# Patient Record
Sex: Male | Born: 1950 | Race: White | Hispanic: No | Smoking: Current every day smoker
Health system: Southern US, Community
[De-identification: ages and names within clinical notes are randomized; demographics above are authoritative.]

## PROBLEM LIST (undated history)

## (undated) DIAGNOSIS — M1990 Unspecified osteoarthritis, unspecified site: Secondary | ICD-10-CM

## (undated) DIAGNOSIS — IMO0001 Reserved for inherently not codable concepts without codable children: Secondary | ICD-10-CM

## (undated) DIAGNOSIS — E079 Disorder of thyroid, unspecified: Secondary | ICD-10-CM

## (undated) DIAGNOSIS — I10 Essential (primary) hypertension: Secondary | ICD-10-CM

## (undated) DIAGNOSIS — B029 Zoster without complications: Secondary | ICD-10-CM

## (undated) DIAGNOSIS — F1010 Alcohol abuse, uncomplicated: Secondary | ICD-10-CM

## (undated) DIAGNOSIS — J449 Chronic obstructive pulmonary disease, unspecified: Secondary | ICD-10-CM

## (undated) HISTORY — PX: KNEE SURGERY: SHX244

## (undated) HISTORY — PX: CATARACT EXTRACTION W/ INTRAOCULAR LENS  IMPLANT, BILATERAL: SHX1307

## (undated) HISTORY — PX: TONSILLECTOMY: SUR1361

---

## 2009-09-01 ENCOUNTER — Emergency Department (HOSPITAL_COMMUNITY): Admission: EM | Admit: 2009-09-01 | Discharge: 2009-09-01 | Payer: Self-pay | Admitting: Emergency Medicine

## 2010-07-12 LAB — COMPREHENSIVE METABOLIC PANEL
ALT: 19 U/L (ref 0–53)
AST: 29 U/L (ref 0–37)
Albumin: 4.1 g/dL (ref 3.5–5.2)
Creatinine, Ser: 0.95 mg/dL (ref 0.4–1.5)
GFR calc non Af Amer: >60 mL/min (ref >60)
Sodium: 140 mEq/L (ref 135–145)
Total Bilirubin: 0.9 mg/dL (ref 0.3–1.2)

## 2010-07-12 LAB — CBC
HCT: 42.5 % (ref 39.0–52.0)
Hemoglobin: 14.7 g/dL (ref 13.0–17.0)
MCV: 95.1 fL (ref 78.0–100.0)
Platelets: 168 10*3/uL (ref 150–400)
RDW: 14.2 % (ref 11.5–15.5)
WBC: 5.9 10*3/uL (ref 4.0–10.5)

## 2010-07-12 LAB — DIFFERENTIAL
Basophils Relative: 0 % (ref 0–1)
Eosinophils Absolute: 0.1 10*3/uL (ref 0.0–0.7)
Lymphocytes Relative: 41 % (ref 12–46)
Lymphs Abs: 2.4 10*3/uL (ref 0.7–4.0)
Monocytes Relative: 9 % (ref 3–12)
Neutro Abs: 2.8 10*3/uL (ref 1.7–7.7)
Neutrophils Relative %: 48 % (ref 43–77)

## 2010-07-12 LAB — LIPASE, BLOOD: Lipase: 33 U/L (ref 11–59)

## 2014-08-30 ENCOUNTER — Emergency Department (HOSPITAL_COMMUNITY): Payer: Medicare PPO

## 2014-08-30 ENCOUNTER — Emergency Department (HOSPITAL_COMMUNITY)
Admission: EM | Admit: 2014-08-30 | Discharge: 2014-08-30 | Disposition: A | Discharge status: Home / Self Care | Payer: Medicare PPO | Attending: Emergency Medicine | Admitting: Emergency Medicine

## 2014-08-30 ENCOUNTER — Encounter (HOSPITAL_COMMUNITY): Payer: Self-pay | Admitting: *Deleted

## 2014-08-30 DIAGNOSIS — Z8639 Personal history of other endocrine, nutritional and metabolic disease: Secondary | ICD-10-CM | POA: Insufficient documentation

## 2014-08-30 DIAGNOSIS — I10 Essential (primary) hypertension: Secondary | ICD-10-CM | POA: Diagnosis not present

## 2014-08-30 DIAGNOSIS — R630 Anorexia: Secondary | ICD-10-CM | POA: Diagnosis not present

## 2014-08-30 DIAGNOSIS — M25511 Pain in right shoulder: Secondary | ICD-10-CM

## 2014-08-30 DIAGNOSIS — Y9289 Other specified places as the place of occurrence of the external cause: Secondary | ICD-10-CM | POA: Diagnosis not present

## 2014-08-30 DIAGNOSIS — R000 Tachycardia, unspecified: Secondary | ICD-10-CM | POA: Insufficient documentation

## 2014-08-30 DIAGNOSIS — J449 Chronic obstructive pulmonary disease, unspecified: Secondary | ICD-10-CM | POA: Diagnosis not present

## 2014-08-30 DIAGNOSIS — W19XXXA Unspecified fall, initial encounter: Secondary | ICD-10-CM

## 2014-08-30 DIAGNOSIS — Z79899 Other long term (current) drug therapy: Secondary | ICD-10-CM | POA: Diagnosis not present

## 2014-08-30 DIAGNOSIS — S42291A Other displaced fracture of upper end of right humerus, initial encounter for closed fracture: Secondary | ICD-10-CM | POA: Insufficient documentation

## 2014-08-30 DIAGNOSIS — S4991XA Unspecified injury of right shoulder and upper arm, initial encounter: Secondary | ICD-10-CM | POA: Diagnosis present

## 2014-08-30 DIAGNOSIS — W1839XA Other fall on same level, initial encounter: Secondary | ICD-10-CM | POA: Diagnosis not present

## 2014-08-30 DIAGNOSIS — Z8619 Personal history of other infectious and parasitic diseases: Secondary | ICD-10-CM | POA: Diagnosis not present

## 2014-08-30 DIAGNOSIS — Z720 Tobacco use: Secondary | ICD-10-CM | POA: Diagnosis not present

## 2014-08-30 DIAGNOSIS — R112 Nausea with vomiting, unspecified: Secondary | ICD-10-CM | POA: Insufficient documentation

## 2014-08-30 DIAGNOSIS — F1010 Alcohol abuse, uncomplicated: Secondary | ICD-10-CM | POA: Diagnosis not present

## 2014-08-30 DIAGNOSIS — M1990 Unspecified osteoarthritis, unspecified site: Secondary | ICD-10-CM | POA: Insufficient documentation

## 2014-08-30 DIAGNOSIS — S42201A Unspecified fracture of upper end of right humerus, initial encounter for closed fracture: Secondary | ICD-10-CM

## 2014-08-30 DIAGNOSIS — S0990XA Unspecified injury of head, initial encounter: Secondary | ICD-10-CM | POA: Insufficient documentation

## 2014-08-30 DIAGNOSIS — Y998 Other external cause status: Secondary | ICD-10-CM | POA: Diagnosis not present

## 2014-08-30 DIAGNOSIS — Y9389 Activity, other specified: Secondary | ICD-10-CM | POA: Insufficient documentation

## 2014-08-30 HISTORY — DX: Zoster without complications: B02.9

## 2014-08-30 HISTORY — DX: Alcohol abuse, uncomplicated: F10.10

## 2014-08-30 HISTORY — DX: Essential (primary) hypertension: I10

## 2014-08-30 HISTORY — DX: Chronic obstructive pulmonary disease, unspecified: J44.9

## 2014-08-30 HISTORY — DX: Disorder of thyroid, unspecified: E07.9

## 2014-08-30 HISTORY — DX: Unspecified osteoarthritis, unspecified site: M19.90

## 2014-08-30 LAB — COMPREHENSIVE METABOLIC PANEL
ALK PHOS: 51 U/L (ref 38–126)
ALT: 24 U/L (ref 17–63)
AST: 56 U/L — ABNORMAL HIGH (ref 15–41)
Albumin: 3.6 g/dL (ref 3.5–5.0)
Anion gap: 17 — ABNORMAL HIGH (ref 5–15)
BILIRUBIN TOTAL: 0.8 mg/dL (ref 0.3–1.2)
BUN: 18 mg/dL (ref 6–20)
CO2: 19 mmol/L — AB (ref 22–32)
Calcium: 8.4 mg/dL — ABNORMAL LOW (ref 8.9–10.3)
Chloride: 101 mmol/L (ref 101–111)
Creatinine, Ser: 1.03 mg/dL (ref 0.61–1.24)
GLUCOSE: 98 mg/dL (ref 70–99)
POTASSIUM: 5.4 mmol/L — AB (ref 3.5–5.1)
SODIUM: 137 mmol/L (ref 135–145)
Total Protein: 6.2 g/dL — ABNORMAL LOW (ref 6.5–8.1)

## 2014-08-30 LAB — CBC
HCT: 33.6 % — ABNORMAL LOW (ref 39.0–52.0)
HEMOGLOBIN: 11.6 g/dL — AB (ref 13.0–17.0)
MCH: 33.4 pg (ref 26.0–34.0)
MCHC: 34.5 g/dL (ref 30.0–36.0)
MCV: 96.8 fL (ref 78.0–100.0)
Platelets: 132 10*3/uL — ABNORMAL LOW (ref 150–400)
RBC: 3.47 MIL/uL — ABNORMAL LOW (ref 4.22–5.81)
RDW: 13.7 % (ref 11.5–15.5)
WBC: 9.3 10*3/uL (ref 4.0–10.5)

## 2014-08-30 LAB — LIPASE, BLOOD: LIPASE: 12 U/L — AB (ref 22–51)

## 2014-08-30 MED ORDER — ONDANSETRON HCL 4 MG/2ML IJ SOLN
INTRAMUSCULAR | Status: AC
  Filled 2014-08-30: qty 2

## 2014-08-30 MED ORDER — HYDROCODONE-ACETAMINOPHEN 5-325 MG PO TABS: 1.0000 | ORAL_TABLET | ORAL | Status: DC | PRN

## 2014-08-30 MED ORDER — ONDANSETRON 4 MG PO TBDP: ORAL_TABLET | ORAL | Status: DC

## 2014-08-30 MED ORDER — MORPHINE SULFATE 4 MG/ML IJ SOLN
6.0000 mg | Freq: Once | INTRAMUSCULAR | Status: AC
  Administered 2014-08-30: 6 mg via INTRAVENOUS
  Filled 2014-08-30: qty 2

## 2014-08-30 MED ORDER — ONDANSETRON HCL 4 MG/2ML IJ SOLN
4.0000 mg | Freq: Once | INTRAMUSCULAR | Status: AC
  Administered 2014-08-30: 4 mg via INTRAVENOUS

## 2014-08-30 MED ORDER — SODIUM CHLORIDE 0.9 % IV BOLUS (SEPSIS)
1000.0000 mL | Freq: Once | INTRAVENOUS | Status: AC
  Administered 2014-08-30: 1000 mL via INTRAVENOUS

## 2014-08-30 MED ORDER — MORPHINE SULFATE 4 MG/ML IJ SOLN
4.0000 mg | Freq: Once | INTRAMUSCULAR | Status: AC
  Administered 2014-08-30: 4 mg via INTRAVENOUS
  Filled 2014-08-30: qty 1

## 2014-08-30 NOTE — ED Notes (Signed)
Patient fell last night while under the influence of ETOH, injuring R upper arm.  Has been unable to use it since.  Admits to ETOH this AM as well.
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
## 2014-08-30 NOTE — Discharge Instructions (Signed)
For severe pain take norco or vicodin however realize they have the potential for addiction and it can make you sleepy and has tylenol in it.  No operating machinery while taking. If you were given medicines take as directed.  If you are on coumadin or contraceptives realize their levels and effectiveness is altered by many different medicines.  If you have any reaction (rash, tongues swelling, other) to the medicines stop taking and see a physician.  ] Have potassium rechecked later this week. Please follow up as directed and return to the ER or see a physician for new or worsening symptoms.  Thank you. Filed Vitals:   08/30/14 1247  BP: 140/84  Pulse: 116  Temp: 98.4 F (36.9 C)  TempSrc: Oral  Resp: 18  Height: 6\' 2"  (1.88 m)  Weight: 145 lb (65.772 kg)  SpO2: 100%

## 2014-08-30 NOTE — ED Notes (Signed)
Patient taking fluids w/out difficulty.

## 2014-08-30 NOTE — ED Notes (Addendum)
Patient with no complaints at this time. Respirations even and unlabored. Skin warm/dry. Discharge instructions reviewed with patient at this time. Patient given opportunity to voice concerns/ask questions. IV d/c'ed and bandaid applied to site. Patient discharged at this time and left Emergency Department with steady gait.

## 2014-09-10 ENCOUNTER — Other Ambulatory Visit: Payer: Self-pay | Admitting: Orthopedic Surgery

## 2014-09-23 ENCOUNTER — Encounter (HOSPITAL_COMMUNITY): Payer: Self-pay | Admitting: *Deleted

## 2014-09-23 MED ORDER — MUPIROCIN 2 % EX OINT
1.0000 "application " | TOPICAL_OINTMENT | CUTANEOUS | Status: AC
  Administered 2014-09-24: 1 via TOPICAL
  Filled 2014-09-23: qty 22

## 2014-09-23 MED ORDER — CEFAZOLIN SODIUM-DEXTROSE 2-3 GM-% IV SOLR
2.0000 g | INTRAVENOUS | Status: AC
  Administered 2014-09-24: 2 g via INTRAVENOUS
  Filled 2014-09-23: qty 50

## 2014-09-23 NOTE — Progress Notes (Signed)
Pt denies chest pain and being under the care of a cardiologist. Pt stated that he has COPD and has SOB with exertion at times. Pt denies having a chest x ray and EKG within the last year. Pt denies having a cardiac cath and stress test. Pt made aware to stop taking Aspirin, NSAIDs and herbal medications. Pt and pt spouse verbalized understanding of all pre-op instructions.

## 2014-09-23 NOTE — Progress Notes (Signed)
   09/23/14 1236  OBSTRUCTIVE SLEEP APNEA  Have you ever been diagnosed with sleep apnea through a sleep study? No  Do you snore loudly (loud enough to be heard through closed doors)?  0  Do you often feel tired, fatigued, or sleepy during the daytime? 1  Has anyone observed you stop breathing during your sleep? 0  Do you have, or are you being treated for high blood pressure? 1  BMI more than 35 kg/m2? 0  Age over 64 years old? 1  Gender: 1

## 2014-09-24 ENCOUNTER — Inpatient Hospital Stay (HOSPITAL_COMMUNITY): Payer: Medicare PPO

## 2014-09-24 ENCOUNTER — Inpatient Hospital Stay (HOSPITAL_COMMUNITY): Payer: Medicare PPO | Admitting: Anesthesiology

## 2014-09-24 ENCOUNTER — Observation Stay (HOSPITAL_COMMUNITY)
Admission: RE | Admit: 2014-09-24 | Discharge: 2014-09-25 | Disposition: A | Discharge status: Home / Self Care | Payer: Medicare PPO | Source: Ambulatory Visit | Attending: Orthopedic Surgery | Admitting: Orthopedic Surgery

## 2014-09-24 ENCOUNTER — Encounter (HOSPITAL_COMMUNITY)
Admission: RE | Disposition: A | Discharge status: Home / Self Care | Payer: Self-pay | Source: Ambulatory Visit | Attending: Orthopedic Surgery

## 2014-09-24 ENCOUNTER — Encounter (HOSPITAL_COMMUNITY): Payer: Self-pay | Admitting: *Deleted

## 2014-09-24 DIAGNOSIS — I10 Essential (primary) hypertension: Secondary | ICD-10-CM | POA: Insufficient documentation

## 2014-09-24 DIAGNOSIS — F17210 Nicotine dependence, cigarettes, uncomplicated: Secondary | ICD-10-CM | POA: Insufficient documentation

## 2014-09-24 DIAGNOSIS — Z419 Encounter for procedure for purposes other than remedying health state, unspecified: Secondary | ICD-10-CM

## 2014-09-24 DIAGNOSIS — S42209A Unspecified fracture of upper end of unspecified humerus, initial encounter for closed fracture: Secondary | ICD-10-CM

## 2014-09-24 DIAGNOSIS — S42201A Unspecified fracture of upper end of right humerus, initial encounter for closed fracture: Secondary | ICD-10-CM | POA: Diagnosis present

## 2014-09-24 DIAGNOSIS — J449 Chronic obstructive pulmonary disease, unspecified: Secondary | ICD-10-CM | POA: Diagnosis not present

## 2014-09-24 DIAGNOSIS — E039 Hypothyroidism, unspecified: Secondary | ICD-10-CM | POA: Diagnosis not present

## 2014-09-24 DIAGNOSIS — S42351A Displaced comminuted fracture of shaft of humerus, right arm, initial encounter for closed fracture: Secondary | ICD-10-CM | POA: Diagnosis not present

## 2014-09-24 DIAGNOSIS — Z01818 Encounter for other preprocedural examination: Secondary | ICD-10-CM

## 2014-09-24 DIAGNOSIS — W19XXXA Unspecified fall, initial encounter: Secondary | ICD-10-CM | POA: Diagnosis not present

## 2014-09-24 HISTORY — PX: ORIF HUMERUS FRACTURE: SHX2126

## 2014-09-24 HISTORY — DX: Unspecified fracture of upper end of unspecified humerus, initial encounter for closed fracture: S42.209A

## 2014-09-24 HISTORY — DX: Reserved for inherently not codable concepts without codable children: IMO0001

## 2014-09-24 LAB — COMPREHENSIVE METABOLIC PANEL
ALBUMIN: 3.8 g/dL (ref 3.5–5.0)
ALT: 11 U/L — ABNORMAL LOW (ref 17–63)
ANION GAP: 9 (ref 5–15)
AST: 19 U/L (ref 15–41)
Alkaline Phosphatase: 93 U/L (ref 38–126)
BILIRUBIN TOTAL: 0.7 mg/dL (ref 0.3–1.2)
BUN: 11 mg/dL (ref 6–20)
CALCIUM: 9.4 mg/dL (ref 8.9–10.3)
CHLORIDE: 99 mmol/L — AB (ref 101–111)
CO2: 26 mmol/L (ref 22–32)
CREATININE: 1.11 mg/dL (ref 0.61–1.24)
GLUCOSE: 96 mg/dL (ref 65–99)
POTASSIUM: 3.4 mmol/L — AB (ref 3.5–5.1)
Sodium: 134 mmol/L — ABNORMAL LOW (ref 135–145)
Total Protein: 7 g/dL (ref 6.5–8.1)

## 2014-09-24 LAB — CBC WITH DIFFERENTIAL/PLATELET
BASOS ABS: 0 10*3/uL (ref 0.0–0.1)
Basophils Relative: 1 % (ref 0–1)
EOS PCT: 3 % (ref 0–5)
Eosinophils Absolute: 0.1 10*3/uL (ref 0.0–0.7)
HCT: 37.1 % — ABNORMAL LOW (ref 39.0–52.0)
Hemoglobin: 12.6 g/dL — ABNORMAL LOW (ref 13.0–17.0)
Lymphocytes Relative: 40 % (ref 12–46)
Lymphs Abs: 1.7 10*3/uL (ref 0.7–4.0)
MCH: 32.6 pg (ref 26.0–34.0)
MCHC: 34 g/dL (ref 30.0–36.0)
MCV: 96.1 fL (ref 78.0–100.0)
MONOS PCT: 13 % — AB (ref 3–12)
Monocytes Absolute: 0.5 10*3/uL (ref 0.1–1.0)
NEUTROS ABS: 1.8 10*3/uL (ref 1.7–7.7)
NEUTROS PCT: 43 % (ref 43–77)
Platelets: 208 10*3/uL (ref 150–400)
RBC: 3.86 MIL/uL — AB (ref 4.22–5.81)
RDW: 13.1 % (ref 11.5–15.5)
WBC: 4.2 10*3/uL (ref 4.0–10.5)

## 2014-09-24 LAB — SURGICAL PCR SCREEN
MRSA, PCR: NEGATIVE
STAPHYLOCOCCUS AUREUS: NEGATIVE

## 2014-09-24 LAB — PROTIME-INR
INR: 1.05 (ref 0.00–1.49)
PROTHROMBIN TIME: 13.9 s (ref 11.6–15.2)

## 2014-09-24 LAB — APTT: aPTT: 30 seconds (ref 24–37)

## 2014-09-24 SURGERY — OPEN REDUCTION INTERNAL FIXATION (ORIF) PROXIMAL HUMERUS FRACTURE
Anesthesia: General | Site: Shoulder | Laterality: Right

## 2014-09-24 MED ORDER — DEXTROSE 5 % IV SOLN
INTRAVENOUS | Status: DC | PRN
  Administered 2014-09-24: 08:00:00 via INTRAVENOUS

## 2014-09-24 MED ORDER — MIDAZOLAM HCL 2 MG/2ML IJ SOLN
INTRAMUSCULAR | Status: AC
  Filled 2014-09-24: qty 2

## 2014-09-24 MED ORDER — CEFAZOLIN SODIUM 1-5 GM-% IV SOLN
1.0000 g | Freq: Four times a day (QID) | INTRAVENOUS | Status: AC
  Administered 2014-09-24 – 2014-09-25 (×3): 1 g via INTRAVENOUS
  Filled 2014-09-24 (×3): qty 50

## 2014-09-24 MED ORDER — POVIDONE-IODINE 7.5 % EX SOLN: Freq: Once | CUTANEOUS | Status: DC

## 2014-09-24 MED ORDER — MORPHINE SULFATE 2 MG/ML IJ SOLN: 2.0000 mg | INTRAMUSCULAR | Status: DC | PRN

## 2014-09-24 MED ORDER — POLYETHYLENE GLYCOL 3350 17 G PO PACK: 17.0000 g | PACK | Freq: Every day | ORAL | Status: DC | PRN

## 2014-09-24 MED ORDER — LIDOCAINE HCL (CARDIAC) 20 MG/ML IV SOLN
INTRAVENOUS | Status: AC
  Filled 2014-09-24: qty 5

## 2014-09-24 MED ORDER — ASPIRIN EC 325 MG PO TBEC
325.0000 mg | DELAYED_RELEASE_TABLET | Freq: Two times a day (BID) | ORAL | Status: DC
  Administered 2014-09-24 – 2014-09-25 (×3): 325 mg via ORAL
  Filled 2014-09-24 (×3): qty 1

## 2014-09-24 MED ORDER — SUCCINYLCHOLINE CHLORIDE 20 MG/ML IJ SOLN
INTRAMUSCULAR | Status: DC | PRN
  Administered 2014-09-24: 100 mg via INTRAVENOUS

## 2014-09-24 MED ORDER — BISACODYL 10 MG RE SUPP: 10.0000 mg | Freq: Every day | RECTAL | Status: DC | PRN

## 2014-09-24 MED ORDER — LISINOPRIL 10 MG PO TABS
10.0000 mg | ORAL_TABLET | Freq: Every day | ORAL | Status: DC
  Filled 2014-09-24: qty 1

## 2014-09-24 MED ORDER — PROPOFOL 10 MG/ML IV BOLUS
INTRAVENOUS | Status: AC
  Filled 2014-09-24: qty 20

## 2014-09-24 MED ORDER — SUCCINYLCHOLINE CHLORIDE 20 MG/ML IJ SOLN
INTRAMUSCULAR | Status: AC
  Filled 2014-09-24: qty 1

## 2014-09-24 MED ORDER — METOCLOPRAMIDE HCL 5 MG/ML IJ SOLN: 5.0000 mg | Freq: Three times a day (TID) | INTRAMUSCULAR | Status: DC | PRN

## 2014-09-24 MED ORDER — OXYCODONE HCL 5 MG PO TABS
5.0000 mg | ORAL_TABLET | ORAL | Status: DC | PRN
  Administered 2014-09-24 – 2014-09-25 (×6): 10 mg via ORAL
  Filled 2014-09-24 (×6): qty 2

## 2014-09-24 MED ORDER — FENTANYL CITRATE (PF) 250 MCG/5ML IJ SOLN
INTRAMUSCULAR | Status: AC
  Filled 2014-09-24: qty 5

## 2014-09-24 MED ORDER — FLEET ENEMA 7-19 GM/118ML RE ENEM: 1.0000 | ENEMA | Freq: Once | RECTAL | Status: AC | PRN

## 2014-09-24 MED ORDER — LIDOCAINE HCL (CARDIAC) 20 MG/ML IV SOLN
INTRAVENOUS | Status: DC | PRN
  Administered 2014-09-24: 80 mg via INTRAVENOUS

## 2014-09-24 MED ORDER — ACETAMINOPHEN 325 MG PO TABS
650.0000 mg | ORAL_TABLET | Freq: Four times a day (QID) | ORAL | Status: DC | PRN
  Filled 2014-09-24: qty 2

## 2014-09-24 MED ORDER — HYDROMORPHONE HCL 1 MG/ML IJ SOLN
0.2500 mg | INTRAMUSCULAR | Status: DC | PRN
  Administered 2014-09-24 (×2): 0.25 mg via INTRAVENOUS

## 2014-09-24 MED ORDER — ACETAMINOPHEN 650 MG RE SUPP: 650.0000 mg | Freq: Four times a day (QID) | RECTAL | Status: DC | PRN

## 2014-09-24 MED ORDER — MENTHOL 3 MG MT LOZG: 1.0000 | LOZENGE | OROMUCOSAL | Status: DC | PRN

## 2014-09-24 MED ORDER — ONDANSETRON HCL 4 MG/2ML IJ SOLN
INTRAMUSCULAR | Status: DC | PRN
  Administered 2014-09-24: 4 mg via INTRAVENOUS

## 2014-09-24 MED ORDER — ALUMINUM HYDROXIDE GEL 320 MG/5ML PO SUSP
15.0000 mL | ORAL | Status: DC | PRN
  Filled 2014-09-24: qty 30

## 2014-09-24 MED ORDER — HYDROMORPHONE HCL 1 MG/ML IJ SOLN
INTRAMUSCULAR | Status: AC
  Filled 2014-09-24: qty 1

## 2014-09-24 MED ORDER — DIPHENHYDRAMINE HCL 12.5 MG/5ML PO ELIX
12.5000 mg | ORAL_SOLUTION | ORAL | Status: DC | PRN
Start: 2014-09-24 — End: 2014-09-25

## 2014-09-24 MED ORDER — PROPOFOL 10 MG/ML IV BOLUS
INTRAVENOUS | Status: DC | PRN
  Administered 2014-09-24: 110 mg via INTRAVENOUS

## 2014-09-24 MED ORDER — DEXTROSE 5 % IV SOLN
10.0000 mg | INTRAVENOUS | Status: DC | PRN
  Administered 2014-09-24: 20 ug/min via INTRAVENOUS

## 2014-09-24 MED ORDER — DOCUSATE SODIUM 100 MG PO CAPS
100.0000 mg | ORAL_CAPSULE | Freq: Two times a day (BID) | ORAL | Status: DC
  Administered 2014-09-24 – 2014-09-25 (×3): 100 mg via ORAL
  Filled 2014-09-24 (×3): qty 1

## 2014-09-24 MED ORDER — LACTATED RINGERS IV SOLN
INTRAVENOUS | Status: DC | PRN
  Administered 2014-09-24 (×3): via INTRAVENOUS

## 2014-09-24 MED ORDER — SODIUM CHLORIDE 0.9 % IV SOLN
INTRAVENOUS | Status: DC
  Administered 2014-09-24 – 2014-09-25 (×2): via INTRAVENOUS

## 2014-09-24 MED ORDER — ONDANSETRON HCL 4 MG/2ML IJ SOLN: 4.0000 mg | Freq: Four times a day (QID) | INTRAMUSCULAR | Status: DC | PRN

## 2014-09-24 MED ORDER — ACETAMINOPHEN 500 MG PO TABS
1000.0000 mg | ORAL_TABLET | Freq: Four times a day (QID) | ORAL | Status: AC
  Administered 2014-09-24 – 2014-09-25 (×4): 1000 mg via ORAL
  Filled 2014-09-24 (×4): qty 2

## 2014-09-24 MED ORDER — PROMETHAZINE HCL 25 MG/ML IJ SOLN
6.2500 mg | INTRAMUSCULAR | Status: DC | PRN
Start: 2014-09-24 — End: 2014-09-24

## 2014-09-24 MED ORDER — PHENOL 1.4 % MT LIQD: 1.0000 | OROMUCOSAL | Status: DC | PRN

## 2014-09-24 MED ORDER — ALBUMIN HUMAN 5 % IV SOLN
INTRAVENOUS | Status: DC | PRN
  Administered 2014-09-24: 09:00:00 via INTRAVENOUS

## 2014-09-24 MED ORDER — MIDAZOLAM HCL 5 MG/5ML IJ SOLN
INTRAMUSCULAR | Status: DC | PRN
  Administered 2014-09-24: 1 mg via INTRAVENOUS
  Administered 2014-09-24 (×2): 0.5 mg via INTRAVENOUS

## 2014-09-24 MED ORDER — SODIUM CHLORIDE 0.9 % IR SOLN
Status: DC | PRN
  Administered 2014-09-24: 3000 mL

## 2014-09-24 MED ORDER — ROPIVACAINE HCL 5 MG/ML IJ SOLN
INTRAMUSCULAR | Status: DC | PRN
  Administered 2014-09-24: 25 mL via PERINEURAL

## 2014-09-24 MED ORDER — METOCLOPRAMIDE HCL 5 MG PO TABS: 5.0000 mg | ORAL_TABLET | Freq: Three times a day (TID) | ORAL | Status: DC | PRN

## 2014-09-24 MED ORDER — ONDANSETRON HCL 4 MG/2ML IJ SOLN
INTRAMUSCULAR | Status: AC
  Filled 2014-09-24: qty 2

## 2014-09-24 MED ORDER — ONDANSETRON HCL 4 MG PO TABS: 4.0000 mg | ORAL_TABLET | Freq: Four times a day (QID) | ORAL | Status: DC | PRN

## 2014-09-24 MED ORDER — FENTANYL CITRATE (PF) 100 MCG/2ML IJ SOLN
INTRAMUSCULAR | Status: DC | PRN
  Administered 2014-09-24 (×5): 50 ug via INTRAVENOUS

## 2014-09-24 SURGICAL SUPPLY — 86 items
BIT DRILL 3.2 (BIT) ×2
BIT DRILL 3.2XCALB NS DISP (BIT) ×2 IMPLANT
BIT DRILL CALIBRATED 2.7 (BIT) ×3 IMPLANT
BIT DRILL CALIBRATED 2.7MM (BIT) ×1
BIT DRL 3.2XCALB NS DISP (BIT) ×2
BLADE SAW SAG 73X25 THK (BLADE) ×2
BLADE SAW SGTL 73X25 THK (BLADE) ×2 IMPLANT
BLADE SURG 15 STRL LF DISP TIS (BLADE) ×2 IMPLANT
BLADE SURG 15 STRL SS (BLADE) ×2
BOWL SMART MIX CTS (DISPOSABLE) IMPLANT
CHLORAPREP W/TINT 26ML (MISCELLANEOUS) ×4 IMPLANT
CLOSURE WOUND 1/2 X4 (GAUZE/BANDAGES/DRESSINGS) ×1
COVER MAYO STAND STRL (DRAPES) ×4 IMPLANT
COVER SURGICAL LIGHT HANDLE (MISCELLANEOUS) ×4 IMPLANT
DRAPE INCISE IOBAN 66X45 STRL (DRAPES) ×8 IMPLANT
DRAPE ORTHO SPLIT 77X108 STRL (DRAPES) ×4
DRAPE SURG 17X23 STRL (DRAPES) ×4 IMPLANT
DRAPE SURG ORHT 6 SPLT 77X108 (DRAPES) ×4 IMPLANT
DRAPE U-SHAPE 47X51 STRL (DRAPES) ×4 IMPLANT
DRILL BIT 5/64 (BIT) ×4 IMPLANT
DRSG AQUACEL AG ADV 3.5X10 (GAUZE/BANDAGES/DRESSINGS) ×4 IMPLANT
ELECT BLADE 4.0 EZ CLEAN MEGAD (MISCELLANEOUS) ×4
ELECT REM PT RETURN 9FT ADLT (ELECTROSURGICAL) ×4
ELECTRODE BLDE 4.0 EZ CLN MEGD (MISCELLANEOUS) ×2 IMPLANT
ELECTRODE REM PT RTRN 9FT ADLT (ELECTROSURGICAL) ×2 IMPLANT
EVACUATOR 1/8 PVC DRAIN (DRAIN) IMPLANT
GLOVE BIO SURGEON STRL SZ7 (GLOVE) ×8 IMPLANT
GLOVE BIO SURGEON STRL SZ7.5 (GLOVE) ×4 IMPLANT
GLOVE BIOGEL PI IND STRL 7.0 (GLOVE) ×2 IMPLANT
GLOVE BIOGEL PI IND STRL 8 (GLOVE) ×2 IMPLANT
GLOVE BIOGEL PI INDICATOR 7.0 (GLOVE) ×2
GLOVE BIOGEL PI INDICATOR 8 (GLOVE) ×2
GOWN STRL REUS W/ TWL LRG LVL3 (GOWN DISPOSABLE) ×6 IMPLANT
GOWN STRL REUS W/ TWL XL LVL3 (GOWN DISPOSABLE) ×2 IMPLANT
GOWN STRL REUS W/TWL LRG LVL3 (GOWN DISPOSABLE) ×6
GOWN STRL REUS W/TWL XL LVL3 (GOWN DISPOSABLE) ×2
HANDPIECE INTERPULSE COAX TIP (DISPOSABLE) ×2
HOOD PEEL AWAY FACE SHEILD DIS (HOOD) ×8 IMPLANT
K-WIRE 2X5 SS THRDED S3 (WIRE) ×8
KIT BASIN OR (CUSTOM PROCEDURE TRAY) ×4 IMPLANT
KIT ROOM TURNOVER OR (KITS) ×4 IMPLANT
KWIRE 2X5 SS THRDED S3 (WIRE) ×4 IMPLANT
MANIFOLD NEPTUNE II (INSTRUMENTS) ×4 IMPLANT
NEEDLE HYPO 25GX1X1/2 BEV (NEEDLE) ×4 IMPLANT
NEEDLE MAYO TROCAR (NEEDLE) ×4 IMPLANT
NOZZLE PRISM 8.5MM (MISCELLANEOUS) IMPLANT
NS IRRIG 1000ML POUR BTL (IV SOLUTION) ×4 IMPLANT
PACK SHOULDER (CUSTOM PROCEDURE TRAY) ×4 IMPLANT
PAD ARMBOARD 7.5X6 YLW CONV (MISCELLANEOUS) ×8 IMPLANT
PEG LOCKING 3.2MMX44 (Peg) ×4 IMPLANT
PEG LOCKING 3.2MMX46 (Peg) ×4 IMPLANT
PEG LOCKING 3.2MMX54 (Peg) ×8 IMPLANT
PEG LOCKING 3.2MMX56 (Peg) ×4 IMPLANT
PEG LOCKING 3.2MMX65 (Peg) ×8 IMPLANT
PLATE 3HOLE HUMERUS PROX RT (Plate) ×4 IMPLANT
PUTTY DBM STAGRAFT PLUS 5CC (Putty) ×4 IMPLANT
RETRIEVER SUT HEWSON (MISCELLANEOUS) ×4 IMPLANT
SCREW CORTICAL LOW PROF 3.5X32 (Screw) ×4 IMPLANT
SCREW CORTICAL LOW PROF 3.5X34 (Screw) ×8 IMPLANT
SET HNDPC FAN SPRY TIP SCT (DISPOSABLE) ×2 IMPLANT
SLEEVE MEASURING 3.2 (BIT) ×4 IMPLANT
SLEEVE SURGEON STRL (DRAPES) ×4 IMPLANT
SLING ARM IMMOBILIZER LRG (SOFTGOODS) ×4 IMPLANT
SLING ARM IMMOBILIZER MED (SOFTGOODS) IMPLANT
SPONGE LAP 18X18 X RAY DECT (DISPOSABLE) ×4 IMPLANT
SPONGE LAP 4X18 X RAY DECT (DISPOSABLE) ×4 IMPLANT
STRIP CLOSURE SKIN 1/2X4 (GAUZE/BANDAGES/DRESSINGS) ×3 IMPLANT
SUCTION FRAZIER TIP 10 FR DISP (SUCTIONS) ×4 IMPLANT
SUPPORT WRAP ARM LG (MISCELLANEOUS) ×4 IMPLANT
SUT ETHIBOND NAB CT1 #1 30IN (SUTURE) IMPLANT
SUT FIBERWIRE #2 38 T-5 BLUE (SUTURE)
SUT MAXBRAID (SUTURE) ×12 IMPLANT
SUT MNCRL AB 4-0 PS2 18 (SUTURE) ×4 IMPLANT
SUT SILK 2 0 TIES 17X18 (SUTURE)
SUT SILK 2-0 18XBRD TIE BLK (SUTURE) IMPLANT
SUT VIC AB 0 CTB1 27 (SUTURE) ×4 IMPLANT
SUT VIC AB 2-0 CT1 27 (SUTURE) ×2
SUT VIC AB 2-0 CT1 TAPERPNT 27 (SUTURE) ×2 IMPLANT
SUTURE FIBERWR #2 38 T-5 BLUE (SUTURE) IMPLANT
SYR CONTROL 10ML LL (SYRINGE) ×4 IMPLANT
SYRINGE TOOMEY DISP (SYRINGE) ×4 IMPLANT
TAPE FIBER 2MM 7IN #2 BLUE (SUTURE) IMPLANT
TOWEL OR 17X24 6PK STRL BLUE (TOWEL DISPOSABLE) ×4 IMPLANT
TOWEL OR 17X26 10 PK STRL BLUE (TOWEL DISPOSABLE) ×4 IMPLANT
WATER STERILE IRR 1000ML POUR (IV SOLUTION) ×4 IMPLANT
YANKAUER SUCT BULB TIP NO VENT (SUCTIONS) ×4 IMPLANT

## 2014-09-24 NOTE — H&P (Signed)
Larry [name] is an 64 y.o. male.   Chief Complaint: R shoulder injury HPI: s/p fall with R displaced proximal humerus fracture.  Past Medical History  Diagnosis Date  . Shingles   . Alcohol abuse   . 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 Thyroid disease     hypothyroidism  . Arthritis   . Hypertension   . Shortness of breath dyspnea     SOB with exertion    Past Surgical History  Procedure Laterality Date  . Knee surgery    . Tonsillectomy    . Cataract extraction w/ intraocular lens  implant, bilateral      Family History  Problem Relation Age of Onset  . Thyroid disease Mother   . Diabetes Other    Social History:  reports that he has been smoking Cigarettes.  He has been smoking about 0.50 packs per day. He does not have any smokeless tobacco history on file. He reports that he drinks alcohol. He reports that he does not use illicit drugs.  Allergies: No Known Allergies  Medications Prior to Admission  Medication Sig Dispense Refill  . HYDROcodone-acetaminophen (NORCO) 5-325 MG per tablet Take 1-2 tablets by mouth every 4 (four) hours as needed. (Patient taking differently: Take 1-2 tablets by mouth every 4 (four) hours as needed (pain). ) 20 tablet 0  . lisinopril (PRINIVIL,ZESTRIL) 10 MG tablet Take 10 mg by mouth daily.    Marland Kitchen MAGNESIUM PO Take 1 tablet by mouth daily.    . Multiple Vitamins-Minerals (MULTIVITAMIN PO) Take 1 tablet by mouth daily.    Marland Kitchen thiamine 100 MG tablet Take 100 mg by mouth daily.    . ondansetron (ZOFRAN ODT) 4 MG disintegrating tablet 4mg  ODT q4 hours prn nausea/vomit (Patient not taking: Reported on 09/24/2014) 4 tablet 0    No results found for this or any previous visit (from the past 48 hour(s)). Dg Chest 2 View  09/24/2014   CLINICAL DATA:  Preop right shoulder surgery.  EXAM: CHEST  2 VIEW  COMPARISON:  09/01/2009  FINDINGS: The lungs are hyperinflated. There are innumerable calcified granuloma scattered  throughout both lungs. No consolidation, pleural effusion, or pneumothorax. Pulmonary vasculature is normal. Old right rib fractures are seen. Displaced proximal right humerus fracture, partially included. Chronic change about the left distal clavicle.  IMPRESSION: 1. Hyperinflation.  No acute pulmonary process. 2. Innumerable calcified granuloma throughout both lungs. 3. Old right rib fractures.  Right proximal humerus fracture.   Electronically Signed   By: 11/01/2009 M.D.   On: 09/24/2014 07:05    Review of Systems  All other systems reviewed and are negative.   Blood pressure 128/72, temperature 97.7 F (36.5 C), temperature source Oral, resp. rate 18, height 6' (1.829 m), weight 65.772 kg (145 lb), SpO2 100 %. Physical Exam  Constitutional: He is oriented to person, place, and time. He appears well-developed and well-nourished.  HENT:  Head: Atraumatic.  Eyes: EOM are normal.  Cardiovascular: Intact distal pulses.   Respiratory: Effort normal.  Musculoskeletal:  R shoulder mild swelling, pain with ROM, NVID  Neurological: He is alert and oriented to person, place, and time.  Skin: Skin is warm and dry.  Psychiatric: He has a normal mood and affect.     Assessment/Plan R displaced comminuted proximal humerus fracture Plan ORIF vs reverse TSA Risks / benefits of surgery discussed Consent on chart  NPO for OR Preop antibiotics   11-26-1984 Jones Broom  09/24/2014, 7:16 AM

## 2014-09-24 NOTE — Op Note (Signed)
Procedure(s): OPEN REDUCTION INTERNAL FIXATION (ORIF) PROXIMAL HUMERUS FRACTURE Procedure Note  Larry [name] male 64 y.o. 09/24/2014  Procedure(s) and Anesthesia Type:    * OPEN REDUCTION INTERNAL FIXATION (ORIF) RIGHT PROXIMAL HUMERUS FRACTURE - General  Surgeon(s) and Role:    * Jones Broom, MD - Primary   Indications:  64 y.o. male s/p fall with right severely displaced proximal humerus fracture 4 weeks ago. Indicated for surgery to try and restore function of the shoulder, prevent nonunion and allow as much function and pain relief as possible with this severe injury. He also understood that if bone quality was poor and combination was greater than expected we would consider arthroplasty.     Surgeon: Larry [name]   Assistants: Damita Lack PA-C [facility] was present and scrubbed throughout the procedure and was essential in positioning, retraction, exposure, and closure)  Anesthesia: General endotracheal anesthesia with preoperative interscalene block given by the attending anesthesiologist    Procedure Detail  OPEN REDUCTION INTERNAL FIXATION (ORIF) PROXIMAL HUMERUS FRACTURE  Findings: Biomet S3 plate with locking pegs proximally. Extensive mobilization was required to achieve acceptable reduction. The articular surface was intact.  Estimated Blood Loss:  700 mL         Drains: none  Blood Given: none         Specimens: none        Complications:  * No complications entered in OR log *         Disposition: PACU - hemodynamically stable.         Condition: stable    Procedure:   DESCRIPTION OF PROCEDURE: The patient was identified in preoperative  holding area where I personally marked the operative site after  verifying site, side, and procedure with the patient. The patient was taken back  to the operating room where general anesthesia was induced without  complication and was placed in the beach-chair position with the back  elevated  about 40 degrees and all extremities carefully padded and  positioned.  The right upper extremity was then prepped and  draped in a standard sterile fashion. The appropriate time-out  procedure was carried out. The patient did receive IV antibiotics  within 30 minutes of incision.  A approximate 10 cm incision was made from the tip of the coracoid to the center point on the humerus. Dissection was carried down through subcutaneous tissues. The cephalic vein was identified and the deltopectoral interval was then divided. The cephalic vein was taken laterally with the deltoid. The conjoined tendon was identified and retractors placed beneath the conjoined tendon medially and the deltoid laterally. The biceps tendon was identified and traced into the central groove. The rotator interval was opened sharply. Biceps tendon was tenotomized and the proximal portion was cut and discarded. Cobb elevator was used to carefully identify fracture fragments. The shaft was identified distally and then traced proximally very carefully. The shaft is noted to be severely displaced posteriorly and medially into the axilla. Careful dissection was made taking great care to avoid plunging deeply. Using a bone hook and Cobb elevator the shaft was eventually able to be reduced anteriorly. The humeral head was noted to have some impaction of the central bone with a notable void when the shaft was reduced. Therefore some autograft bone was used from exposure and 5 mL of DBX paste was used to fill the void. The shaft was then impacted into the head with some compression and a 3 hole plate was laid laterally. Position was  verified with fluoroscopy and the proximal pegs and distal nonlocking screws were drilled measured and filled with appropriate sized pegs and screws. Careful fluoroscopic imaging in all planes revealed no penetration of the articular surface. It was also able to digitally feel through the rotator interval to ensure no  penetration the joint. 3 #2 max braid sutures were previously placed in rotator cuff bone tendon junction in the supraspinatus infraspinatus and subscapularis and were carefully tied into the plate to reinforce the repair. The wound was copiously irrigated with normal saline and subsequent to closed in layers with 20 Vicryls and staples. A light sterile dressing was applied. Patient was allowed to awaken from anesthesia transferred to the stretcher and taken to the recovery room in stable condition.  Postoperative plan: He will be observed overnight for pain control and antibiotics and likely discharged tomorrow. He will remain in a sling for 6 weeks without early motion.

## 2014-09-24 NOTE — Transfer of Care (Signed)
Immediate Anesthesia Transfer of Care Note  Patient: Larry [name]  Procedure(s) Performed: Procedure(s): OPEN REDUCTION INTERNAL FIXATION (ORIF) PROXIMAL HUMERUS FRACTURE (Right)  Patient Location: PACU  Anesthesia Type:General  Level of Consciousness: awake, alert  and patient cooperative  Airway & Oxygen Therapy: Patient Spontanous Breathing and Patient connected to nasal cannula oxygen  Post-op Assessment: Report given to RN, Post -op Vital signs reviewed and stable and Patient moving all extremities  Post vital signs: Reviewed and stable  Last Vitals:  Filed Vitals:   09/24/14 0611  BP: 128/72  Temp: 36.5 C  Resp: 18    Complications: No apparent anesthesia complications

## 2014-09-24 NOTE — Anesthesia Preprocedure Evaluation (Addendum)
Anesthesia Evaluation  Patient identified by MRN, date of birth, ID band Patient awake    Reviewed: Allergy & Precautions, NPO status , Patient's Chart, lab work & pertinent test results  Airway Mallampati: II  TM Distance: >3 FB Neck ROM: Full    Dental no notable dental hx. (+) Edentulous Upper, Edentulous Lower, Dental Advisory Given   Pulmonary COPDCurrent Smoker,  breath sounds clear to auscultation  Pulmonary exam normal       Cardiovascular hypertension, Pt. on medications Normal cardiovascular examRhythm:Regular Rate:Normal     Neuro/Psych negative neurological ROS  negative psych ROS   GI/Hepatic negative GI ROS, (+)     substance abuse  alcohol use,   Endo/Other  negative endocrine ROS  Renal/GU negative Renal ROS  negative genitourinary   Musculoskeletal negative musculoskeletal ROS (+)   Abdominal   Peds negative pediatric ROS (+)  Hematology negative hematology ROS (+)   Anesthesia Other Findings Scraggly Beard  Reproductive/Obstetrics negative OB ROS                           Anesthesia Physical Anesthesia Plan  ASA: III  Anesthesia Plan: General   Post-op Pain Management:    Induction: Intravenous  Airway Management Planned: Oral ETT  Additional Equipment:   Intra-op Plan:   Post-operative Plan: Extubation in OR  Informed Consent: I have reviewed the patients History and Physical, chart, labs and discussed the procedure including the risks, benefits and alternatives for the proposed anesthesia with the patient or authorized representative who has indicated his/her understanding and acceptance.   Dental advisory given  Plan Discussed with: CRNA and Surgeon  Anesthesia Plan Comments:         Anesthesia Quick Evaluation

## 2014-09-24 NOTE — Anesthesia Postprocedure Evaluation (Signed)
  Anesthesia Post-op Note  Patient: Larry [name]  Procedure(s) Performed: Procedure(s) (LRB): OPEN REDUCTION INTERNAL FIXATION (ORIF) PROXIMAL HUMERUS FRACTURE (Right)  Patient Location: PACU  Anesthesia Type: GA combined with regional for post-op pain  Level of Consciousness: awake and alert   Airway and Oxygen Therapy: Patient Spontanous Breathing  Post-op Pain: mild  Post-op Assessment: Post-op Vital signs reviewed, Patient's Cardiovascular Status Stable, Respiratory Function Stable, Patent Airway and No signs of Nausea or vomiting  Last Vitals:  Filed Vitals:   09/24/14 1143  BP:   Pulse: 81  Temp:   Resp: 17    Post-op Vital Signs: stable   Complications: No apparent anesthesia complications

## 2014-09-24 NOTE — Anesthesia Procedure Notes (Addendum)
Anesthesia Regional Block:  Interscalene brachial plexus block  Pre-Anesthetic Checklist: ,, timeout performed, Correct Patient, Correct Site, Correct Laterality, Correct Procedure, Correct Position, site marked, Risks and benefits discussed,  Surgical consent,  Pre-op evaluation,  At surgeon's request and post-op pain management  Laterality: Right  Prep: chloraprep       Needles:  Injection technique: Single-shot  Needle Type: Echogenic Stimulator Needle     Needle Length: 9cm 9 cm Needle Gauge: 21 and 21 G    Additional Needles:  Procedures: ultrasound guided (picture in chart) Interscalene brachial plexus block Narrative:  Injection made incrementally with aspirations every 5 mL.  Performed by: Personally  Anesthesiologist: ROSE, Greggory Stallion  Additional Notes: Patient tolerated the procedure well without complications   Procedure Name: Intubation Date/Time: 09/24/2014 7:46 AM Performed by: Darcey Nora B Pre-anesthesia Checklist: Patient identified, Emergency Drugs available, Suction available and Patient being monitored Patient Re-evaluated:Patient Re-evaluated prior to inductionOxygen Delivery Method: Circle system utilized Intubation Type: IV induction Ventilation: Mask ventilation without difficulty Laryngoscope Size: Mac and 3 Grade View: Grade I Tube type: Oral Tube size: 7.5 mm Number of attempts: 1 Airway Equipment and Method: Stylet Placement Confirmation: ETT inserted through vocal cords under direct vision,  breath sounds checked- equal and bilateral and positive ETCO2 Secured at: 21 (cm at gum) cm Tube secured with: Tape Dental Injury: Teeth and Oropharynx as per pre-operative assessment

## 2014-09-25 ENCOUNTER — Encounter (HOSPITAL_COMMUNITY): Payer: Self-pay | Admitting: Orthopedic Surgery

## 2014-09-25 DIAGNOSIS — S42351A Displaced comminuted fracture of shaft of humerus, right arm, initial encounter for closed fracture: Secondary | ICD-10-CM | POA: Diagnosis not present

## 2014-09-25 LAB — BASIC METABOLIC PANEL
ANION GAP: 5 (ref 5–15)
BUN: <5 mg/dL — ABNORMAL LOW (ref 6–20)
CO2: 24 mmol/L (ref 22–32)
Calcium: 7.8 mg/dL — ABNORMAL LOW (ref 8.9–10.3)
Chloride: 104 mmol/L (ref 101–111)
Creatinine, Ser: 0.82 mg/dL (ref 0.61–1.24)
GFR calc Af Amer: >60 mL/min (ref >60)
GFR calc non Af Amer: >60 mL/min (ref >60)
GLUCOSE: 104 mg/dL — AB (ref 65–99)
POTASSIUM: 3.6 mmol/L (ref 3.5–5.1)
SODIUM: 133 mmol/L — AB (ref 135–145)

## 2014-09-25 LAB — CBC
HCT: 21.9 % — ABNORMAL LOW (ref 39.0–52.0)
HCT: 24.5 % — ABNORMAL LOW (ref 39.0–52.0)
Hemoglobin: 7.5 g/dL — ABNORMAL LOW (ref 13.0–17.0)
Hemoglobin: 8.3 g/dL — ABNORMAL LOW (ref 13.0–17.0)
MCH: 33 pg (ref 26.0–34.0)
MCH: 32.7 pg (ref 26.0–34.0)
MCHC: 34.2 g/dL (ref 30.0–36.0)
MCHC: 33.9 g/dL (ref 30.0–36.0)
MCV: 96.5 fL (ref 78.0–100.0)
MCV: 96.5 fL (ref 78.0–100.0)
PLATELETS: 144 10*3/uL — AB (ref 150–400)
Platelets: 140 10*3/uL — ABNORMAL LOW (ref 150–400)
RBC: 2.27 MIL/uL — ABNORMAL LOW (ref 4.22–5.81)
RBC: 2.54 MIL/uL — ABNORMAL LOW (ref 4.22–5.81)
RDW: 12.9 % (ref 11.5–15.5)
RDW: 12.9 % (ref 11.5–15.5)
WBC: 5.4 10*3/uL (ref 4.0–10.5)
WBC: 5.9 10*3/uL (ref 4.0–10.5)

## 2014-09-25 MED ORDER — DOCUSATE SODIUM 100 MG PO CAPS: 100.0000 mg | ORAL_CAPSULE | Freq: Three times a day (TID) | ORAL | Status: DC | PRN

## 2014-09-25 MED ORDER — OXYCODONE-ACETAMINOPHEN 5-325 MG PO TABS: 1.0000 | ORAL_TABLET | ORAL | Status: DC | PRN

## 2014-09-25 NOTE — Discharge Instructions (Signed)
Discharge Instructions after Open Shoulder Repair  A sling has been provided for you. Remain in your sling at all times. This includes sleeping in your sling.  Use ice on the shoulder intermittently over the first 48 hours after surgery.  Pain medicine has been prescribed for you.  Use your medicine liberally over the first 48 hours, and then you can begin to taper your use. You may take Extra Strength Tylenol or Tylenol only in place of the pain pills. DO NOT take ANY nonsteroidal anti-inflammatory pain medications: Advil, Motrin, Ibuprofen, Aleve, Naproxen or Naprosyn.  Leave the dressing on until your first visit with Dr. Angus Palms to shower with dressing on, it is waterproof Simply allow the water to wash over the site and then pat dry. Do not rub the incisions. Make sure your axilla (armpit) is completely dry after showering.  Take one aspirin, a day for 2 weeks after surgery, unless you have an aspirin sensitivity/ allergy or asthma.   Please call [phone] during normal business hours or [phone] after hours for any problems. Including the following:  - excessive redness of the incisions - drainage for more than 4 days - fever of more than 101.5 F  *Please note that pain medications will not be refilled after hours or on weekends.

## 2014-09-25 NOTE — Progress Notes (Signed)
   PATIENT ID: Larene Pickett   1 Day Post-Op Procedure(s) (LRB): OPEN REDUCTION INTERNAL FIXATION (ORIF) PROXIMAL HUMERUS FRACTURE (Right)  Subjective: Doing well today, no pain in right shoulder, no complaints or concerns.   Objective:  Filed Vitals:   09/25/14 0436  BP: 116/63  Pulse: 79  Temp: 98 F (36.7 C)  Resp: 16     R shoulder dressing saturated with blood and changed today Incision with no drainage, sx of infection Distally mild swelling right hand, NVI   Labs:   Recent Labs  09/24/14 0652 09/25/14 0625  HGB 12.6* 7.5*   Recent Labs  09/24/14 0652 09/25/14 0625  WBC 4.2 5.4  RBC 3.86* 2.27*  HCT 37.1* 21.9*  PLT 208 140*   Recent Labs  09/24/14 0652 09/25/14 0625  NA 134* 133*  K 3.4* 3.6  CL 99* 104  CO2 26 24  BUN 11 <5*  CREATININE 1.11 0.82  GLUCOSE 96 104*  CALCIUM 9.4 7.8*    Assessment and Plan: 1 day s/p ORIF right prox humerus fracture ABLA- expected po, asymptomatic, will continue to observe and will hold off on tranfusion Sling, hand wrist elbow rom only OT today D/c home today when ready Pain well controlled  VTE proph: ASA 325mg  BID, SCDs

## 2014-09-25 NOTE — Care Management Note (Signed)
Case Management Note  Patient Details  Name: Larry [name] MRN: [id number] Date of Birth: [birth year]  Subjective/Objective:   Patient was admitted with right shoulder fracture. Underwent a right humerus ORIF.               Action/Plan:  ocupational therapist ordered a 3in1 for patient for home, Case manager spoke with patient who kindly declined.    Expected Discharge Date:  09/25/14               Expected Discharge Plan:  Home/Self Care  In-House Referral:  NA  Discharge planning Services  CM Consult  Post Acute Care Choice:  NA Choice offered to:  Patient  DME Arranged:NA    DME Agency:  NA  HH Arranged:  NA HH Agency:     Status of Service:  Completed, signed off  Medicare Important Message Given:    Date Medicare IM Given:    Medicare IM give by:    Date Additional Medicare IM Given:    Additional Medicare Important Message give by:     If discussed at Long Length of Stay Meetings, dates discussed:    Additional Comments:  Durenda Guthrie, RN 09/25/2014, 2:06 PM

## 2014-09-25 NOTE — Progress Notes (Signed)
Occupational Therapy Evaluation Patient Details Name: Larry [name] MRN: [id number] DOB: 10/12/1950 Today's Date: 09/25/2014    History of Present Illness 64 y.o. male s/p fall with right severely displaced proximal humerus fracture 4 weeks ago. Indicated for surgery to try and restore function of the shoulder, prevent nonunion and allow as much function and pain relief as possible with this severe injury. Patient s/p OPEN REDUCTION INTERNAL FIXATION (ORIF)     Clinical Impression   Patient evaluated by OT. Patient plans to d/c home with 24/7 supervision/assistance from wife who has been assisting him for the past month since his fall and displaced fracture. No further acute OT needs at this time. Recommending OPOT once MD clears and finds appropriate. D/C shoulder handout given and reviewed with patient.Please see exercises completed and educated on below. Patient aware of NO A/PROM > R shoulder. Patient independently able to direct caregivers prn for assistance with ADLs.     Follow Up Recommendations  No OT follow up;Supervision/Assistance - 24 hour    Equipment Recommendations  3 in 1 bedside comode    Recommendations for Other Services  None at this time  Precautions / Restrictions Precautions Precautions: Shoulder Type of Shoulder Precautions: No shoulder A/PROM Shoulder Interventions: Shoulder sling/immobilizer;Off for dressing/bathing/exercises Restrictions Weight Bearing Restrictions: Yes RUE Weight Bearing: Non weight bearing      Mobility Bed Mobility Overal bed mobility: Modified Independent General bed mobility comments: minimal use of bed rails, HOB flat  Transfers Overall transfer level: Independent Equipment used: None General transfer comment: No cues needed, pt overall independent     Balance Overall balance assessment: No apparent balance deficits (not formally assessed)    ADL Overall ADL's : Needs assistance/impaired General ADL Comments: Pt needs  assistance due to restrictions and immobilization of RUE. Pt reports his wife is available to assist prn and states she has been assisting for the past month. Went through entire shoulder d/c protocol and pt able to verbalize understanding. Pt will need assistance at home due to no A/PROM allowed > R shoulder.     Pertinent Vitals/Pain Pain Assessment: 0-10 Pain Score: 3  Pain Location: RUE Pain Descriptors / Indicators: Aching ("steady ache") Pain Intervention(s): Monitored during session;Repositioned     Hand Dominance Right   Extremity/Trunk Assessment Upper Extremity Assessment Upper Extremity Assessment: RUE deficits/detail RUE Deficits / Details: Shoulder protocol: no A/PROM RUE: Unable to fully assess due to immobilization   Lower Extremity Assessment Lower Extremity Assessment: Overall WFL for tasks assessed   Cervical / Trunk Assessment Cervical / Trunk Assessment: Normal   Communication Communication Communication: No difficulties   Cognition Arousal/Alertness: Awake/alert Behavior During Therapy: WFL for tasks assessed/performed Overall Cognitive Status: Within Functional Limits for tasks assessed        Shoulder Instructions Shoulder Instructions Donning/doffing shirt without moving shoulder: Minimal assistance;Patient able to independently direct caregiver Method for sponge bathing under operated UE: Minimal assistance;Patient able to independently direct caregiver Donning/doffing sling/immobilizer: Minimal assistance;Patient able to independently direct caregiver Correct positioning of sling/immobilizer: Minimal assistance;Patient able to independently direct caregiver Pendulum exercises (written home exercise program):  (n/a) ROM for elbow, wrist and digits of operated UE: Minimal assistance;Patient able to independently direct caregiver Sling wearing schedule (on at all times/off for ADL's): Minimal assistance;Patient able to independently direct  caregiver Proper positioning of operated UE when showering: Minimal assistance;Patient able to independently direct caregiver Positioning of UE while sleeping: Minimal assistance;Patient able to independently direct caregiver    Home Living Family/patient expects to  be discharged to:: Private residence Living Arrangements: Spouse/significant other Available Help at Discharge: Family;Available 24 hours/day Type of Home: Mobile home Home Access: Stairs to enter Entrance Stairs-Number of Steps: 4 Entrance Stairs-Rails: Right;Left Home Layout: One level     Bathroom Shower/Tub: Tub/shower unit;Walk-in shower   Bathroom Toilet: Standard     Home Equipment: None    Prior Functioning/Environment Level of Independence: Independent     OT Diagnosis: Generalized weakness;Acute pain   OT Problem List:  n/a, no further acute OT needs identified at this time   OT Treatment/Interventions:   n/a, no further acute OT needs identified at this time   OT Goals(Current goals can be found in the care plan section) Acute Rehab OT Goals Patient Stated Goal: go home today  OT Frequency:   n/a, no further acute OT needs identified at this time   Barriers to D/C:  None known at this time   End of Session Equipment Utilized During Treatment: Other (comment) (sling > RUE)  Activity Tolerance: Patient tolerated treatment well Patient left: in chair;with call bell/phone within reach   Time: 0810-0900 OT Time Calculation (min): 50 min Charges:  OT General Charges $OT Visit: 1 Procedure OT Evaluation $Initial OT Evaluation Tier I: 1 Procedure OT Treatments $Self Care/Home Management : 8-22 mins $Therapeutic Exercise: 8-22 mins G-Codes: OT G-codes **NOT FOR INPATIENT CLASS** Functional Limitation: Self care;Changing and maintaining body position Changing and Maintaining Body Position Current Status [phone]): At least 1 percent but less than 20 percent impaired, limited or restricted Changing and  Maintaining Body Position Goal Status (Y1017): At least 1 percent but less than 20 percent impaired, limited or restricted Changing and Maintaining Body Position Discharge Status [phone]): At least 1 percent but less than 20 percent impaired, limited or restricted Self Care Current Status (E5277): At least 1 percent but less than 20 percent impaired, limited or restricted Self Care Goal Status (O2423): At least 1 percent but less than 20 percent impaired, limited or restricted Self Care Discharge Status [phone]): At least 1 percent but less than 20 percent impaired, limited or restricted  Kelsen Celona , MS, OTR/L, CLT Pager: [phone]   09/25/2014, 9:50 AM

## 2014-09-25 NOTE — Progress Notes (Addendum)
Called Dr Ave Filter, orthopedic surgeon office to obtain DME 3 in 1 order as  per Occupational Therapy recommendation. Patient declines equipment saying " Don't need it" Darl Pikes at Dr Ave Filter office returned call regard to order; made aware of patient refusal.  Being discharged home, self care.

## 2014-09-28 ENCOUNTER — Encounter (HOSPITAL_COMMUNITY): Payer: Self-pay | Admitting: Orthopedic Surgery

## 2014-09-30 NOTE — Discharge Summary (Signed)
Patient ID: Larry [name] MRN: [id number] DOB/AGE: June 16, 1950 64 y.o.  Admit date: 09/24/2014 Discharge date: 09/25/2014  Admission Diagnoses:  Active Problems:   Proximal humerus fracture   Discharge Diagnoses:  Same  Past Medical History  Diagnosis Date  . Shingles   . Alcohol abuse   . 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 Thyroid disease     hypothyroidism  . Arthritis   . Hypertension   . Shortness of breath dyspnea     SOB with exertion    Surgeries: Procedure(s): OPEN REDUCTION INTERNAL FIXATION (ORIF) PROXIMAL HUMERUS FRACTURE on 09/24/2014   Consultants:    Discharged Condition: Improved  Hospital Course: Larry [name] is an 64 y.o. male who was admitted 09/24/2014 for operative treatment of proximal humerus fracture after fall. Xrays determined displaced fracture with best outcome of operative treatment. After pre-op clearance the patient was taken to the operating room on 09/24/2014 and underwent  Procedure(s): OPEN REDUCTION INTERNAL FIXATION (ORIF) PROXIMAL HUMERUS FRACTURE.    Patient was given perioperative antibiotics:  Anti-infectives    Start     Dose/Rate Route Frequency Ordered Stop   09/24/14 1400  ceFAZolin (ANCEF) IVPB 1 g/50 mL premix     1 g 100 mL/hr over 30 Minutes Intravenous Every 6 hours 09/24/14 1200 09/25/14 0323   09/24/14 0700  ceFAZolin (ANCEF) IVPB 2 g/50 mL premix     2 g 100 mL/hr over 30 Minutes Intravenous To Surgery 09/23/14 1158 09/24/14 0748       Patient was given sequential compression devices, early ambulation, and ASA 325mg  BID to prevent DVT.  Patient benefited maximally from hospital stay and there were no complications.    Recent vital signs: No data found.    Recent laboratory studies: No results for input(s): WBC, HGB, HCT, PLT, NA, K, CL, CO2, BUN, CREATININE, GLUCOSE, INR, CALCIUM in the last 72 hours.  Invalid input(s): PT, 2   Discharge Medications:     Medication List    STOP  taking these medications        HYDROcodone-acetaminophen 5-325 MG per tablet  Commonly known as:  NORCO      TAKE these medications        docusate sodium 100 MG capsule  Commonly known as:  COLACE  Take 1 capsule (100 mg total) by mouth 3 (three) times daily as needed.     lisinopril 10 MG tablet  Commonly known as:  PRINIVIL,ZESTRIL  Take 10 mg by mouth daily.     MAGNESIUM PO  Take 1 tablet by mouth daily.     MULTIVITAMIN PO  Take 1 tablet by mouth daily.     ondansetron 4 MG disintegrating tablet  Commonly known as:  ZOFRAN ODT  4mg  ODT q4 hours prn nausea/vomit     oxyCODONE-acetaminophen 5-325 MG per tablet  Commonly known as:  ROXICET  Take 1-2 tablets by mouth every 4 (four) hours as needed for severe pain.     thiamine 100 MG tablet  Take 100 mg by mouth daily.        Diagnostic Studies: Dg Chest 2 View  09/24/2014   CLINICAL DATA:  Preop right shoulder surgery.  EXAM: CHEST  2 VIEW  COMPARISON:  09/01/2009  FINDINGS: The lungs are hyperinflated. There are innumerable calcified granuloma scattered throughout both lungs. No consolidation, pleural effusion, or pneumothorax. Pulmonary vasculature is normal. Old right rib fractures are seen. Displaced proximal right humerus fracture, partially included.  Chronic change about the left distal clavicle.  IMPRESSION: 1. Hyperinflation.  No acute pulmonary process. 2. Innumerable calcified granuloma throughout both lungs. 3. Old right rib fractures.  Right proximal humerus fracture.   Electronically Signed   By: Rubye Oaks M.D.   On: 09/24/2014 07:05   Dg Shoulder Right  09/24/2014   CLINICAL DATA:  64 year old male undergoing proximal right humerus ORIF. Initial encounter.  EXAM: DG C-ARM 61-120 MIN; RIGHT SHOULDER - 2+ VIEW  TECHNIQUE: Two intraoperative fluoroscopic views of the proximal right humerus  CONTRAST:  None.  FLUOROSCOPY TIME:  0 minutes 17 seconds  COMPARISON:  Right humerus series 5816  FINDINGS: Lateral  plate with distal cortical screws and numerous humeral head screws or rods are in place. Significantly improved alignment about the severely comminuted proximal right humerus fracture. Glenohumeral alignment appears preserved. Hardware appears intact.  IMPRESSION: ORIF proximal right humerus with no adverse features.   Electronically Signed   By: Odessa Fleming M.D.   On: 09/24/2014 09:58   Dg C-arm 1-60 Min  09/24/2014   CLINICAL DATA:  64 year old male undergoing proximal right humerus ORIF. Initial encounter.  EXAM: DG C-ARM 61-120 MIN; RIGHT SHOULDER - 2+ VIEW  TECHNIQUE: Two intraoperative fluoroscopic views of the proximal right humerus  CONTRAST:  None.  FLUOROSCOPY TIME:  0 minutes 17 seconds  COMPARISON:  Right humerus series 5816  FINDINGS: Lateral plate with distal cortical screws and numerous humeral head screws or rods are in place. Significantly improved alignment about the severely comminuted proximal right humerus fracture. Glenohumeral alignment appears preserved. Hardware appears intact.  IMPRESSION: ORIF proximal right humerus with no adverse features.   Electronically Signed   By: Odessa Fleming M.D.   On: 09/24/2014 09:58    Disposition: 01-Home or Self Care      Discharge Instructions    Call MD / Call 911    Complete by:  As directed   If you experience chest pain or shortness of breath, CALL 911 and be transported to the hospital emergency room.  If you develope a fever above 101 F, pus (white drainage) or increased drainage or redness at the wound, or calf pain, call your surgeon's office.     Constipation Prevention    Complete by:  As directed   Drink plenty of fluids.  Prune juice may be helpful.  You may use a stool softener, such as Colace (over the counter) 100 mg twice a day.  Use MiraLax (over the counter) for constipation as needed.     Diet - low sodium heart healthy    Complete by:  As directed      Increase activity slowly as tolerated    Complete by:  As directed             Follow-up Information    Follow up with Mable Paris, MD. Schedule an appointment as soon as possible for a visit in 2 weeks.   Specialty:  Orthopedic Surgery   Contact information:   [address] [place] [address] [phone]        Signed: Jiles Harold 09/30/2014, 8:53 AM

## 2014-10-02 NOTE — Addendum Note (Signed)
Addendum  created 10/02/14 0019 by Eilene Ghazi, MD   Modules edited: Anesthesia Events, Narrator   Narrator:  Narrator: Event Log Edited

## 2015-02-05 ENCOUNTER — Emergency Department (HOSPITAL_COMMUNITY)
Admission: EM | Admit: 2015-02-05 | Discharge: 2015-02-05 | Disposition: A | Discharge status: Home / Self Care | Payer: Medicare PPO | Attending: Emergency Medicine | Admitting: Emergency Medicine

## 2015-02-05 ENCOUNTER — Encounter (HOSPITAL_COMMUNITY): Payer: Self-pay | Admitting: *Deleted

## 2015-02-05 DIAGNOSIS — Z79899 Other long term (current) drug therapy: Secondary | ICD-10-CM | POA: Diagnosis not present

## 2015-02-05 DIAGNOSIS — F419 Anxiety disorder, unspecified: Secondary | ICD-10-CM | POA: Insufficient documentation

## 2015-02-05 DIAGNOSIS — L309 Dermatitis, unspecified: Secondary | ICD-10-CM | POA: Insufficient documentation

## 2015-02-05 DIAGNOSIS — Z8639 Personal history of other endocrine, nutritional and metabolic disease: Secondary | ICD-10-CM | POA: Diagnosis not present

## 2015-02-05 DIAGNOSIS — R21 Rash and other nonspecific skin eruption: Secondary | ICD-10-CM | POA: Diagnosis present

## 2015-02-05 DIAGNOSIS — I10 Essential (primary) hypertension: Secondary | ICD-10-CM | POA: Diagnosis not present

## 2015-02-05 DIAGNOSIS — J449 Chronic obstructive pulmonary disease, unspecified: Secondary | ICD-10-CM | POA: Insufficient documentation

## 2015-02-05 DIAGNOSIS — M1990 Unspecified osteoarthritis, unspecified site: Secondary | ICD-10-CM | POA: Diagnosis not present

## 2015-02-05 DIAGNOSIS — Z720 Tobacco use: Secondary | ICD-10-CM | POA: Diagnosis not present

## 2015-02-05 DIAGNOSIS — Z8619 Personal history of other infectious and parasitic diseases: Secondary | ICD-10-CM | POA: Diagnosis not present

## 2015-02-05 MED ORDER — SULFAMETHOXAZOLE-TRIMETHOPRIM 800-160 MG PO TABS: 1.0000 | ORAL_TABLET | Freq: Two times a day (BID) | ORAL | Status: AC

## 2015-02-05 MED ORDER — DEXAMETHASONE SODIUM PHOSPHATE 4 MG/ML IJ SOLN
8.0000 mg | Freq: Once | INTRAMUSCULAR | Status: AC
  Administered 2015-02-05: 8 mg via INTRAMUSCULAR
  Filled 2015-02-05: qty 2

## 2015-02-05 MED ORDER — DIPHENHYDRAMINE-ZINC ACETATE 2-0.1 % EX CREA
TOPICAL_CREAM | Freq: Once | CUTANEOUS | Status: AC
  Administered 2015-02-05: 12:00:00 via TOPICAL
  Filled 2015-02-05: qty 28

## 2015-02-05 MED ORDER — PREDNISONE 10 MG PO TABS: ORAL_TABLET | ORAL | Status: DC

## 2015-02-05 NOTE — ED Notes (Signed)
Pt states rash across upper chest and upper back x 2 weeks. States itching and burning. Also states knot to right leg after a tick bite.

## 2015-02-05 NOTE — ED Notes (Addendum)
Pt c/o itching and burning to upper back and upper chest x 2 weeks. Pt has red rash to upper back and upper chest. Pt reports he has "tried everything" and "nothing helps". Pt denies any change in laundry detergent, shampoo, soap, etc. Pt reports he "hasn't changed anything". Pt also c/o knot to right thigh area from tick bite couple of months ago.

## 2015-02-05 NOTE — Care Management (Signed)
Call received about medication assistance. Pt has insurance and is not eligible for [facility] voucher. RN Aundra Millet anticipates pt will be discharged home with PO prednison. Pt states even though he has insurance he has no money to has meds filled. RN informed this med is on the $4 list at [facility]. RN Aundra Millet will relay info to pt. No further CM needs noted.

## 2015-02-05 NOTE — ED Provider Notes (Signed)
CSN: [id number]     Arrival date & time 02/05/15  1012 History   First MD Initiated Contact with Patient 02/05/15 1031     Chief Complaint  Patient presents with  . Rash     (Consider location/radiation/quality/duration/timing/severity/associated sxs/prior Treatment) The history is provided by the patient.   Larry [name] is a 64 y.o. male presenting with a two-week history of a burning and itching rash which started on his left shoulder and upper back area and has since spread now involving his bilateral upper back and upper chest.  The rash started as itching, and has progressed to a burning pain since he has been scratching at times uncontrollably.  He denies any new exposures to chemicals, soaps, shampoo, laundry detergents.  He is not exposed to chemicals in hobbies or with yard work.  He does not work as he is disabled from COPD.  He also endorses a small knot at his left lower leg where he removed a tick a month ago.  He has had no pain, swelling or erythema surrounding the site, describes a small nontender nodule.  He also denies fevers or chills or other complaint.  He does endorse shortness of breath which is chronic and not worsened today.  Past Medical History  Diagnosis Date  . Shingles   . Alcohol abuse   . 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 Thyroid disease     hypothyroidism  . Arthritis   . Hypertension   . Shortness of breath dyspnea     SOB with exertion   Past Surgical History  Procedure Laterality Date  . Knee surgery    . Tonsillectomy    . Cataract extraction w/ intraocular lens  implant, bilateral    . Orif humerus fracture Right 09/24/2014    Procedure: OPEN REDUCTION INTERNAL FIXATION (ORIF) PROXIMAL HUMERUS FRACTURE;  Surgeon: Jones Broom, MD;  Location: [facility] OR;  Service: Orthopedics;  Laterality: Right;   Family History  Problem Relation Age of Onset  . Thyroid disease Mother   . Diabetes Other    Social History   Substance Use Topics  . Smoking status: Current Every Day Smoker -- 0.50 packs/day    Types: Cigarettes  . Smokeless tobacco: None  . Alcohol Use: Yes     Comment: 1/2 pint liquor daily    Review of Systems  Constitutional: Negative for fever and chills.  HENT: Negative for congestion and sore throat.   Eyes: Negative.   Respiratory: Negative for chest tightness and shortness of breath.   Cardiovascular: Negative for chest pain.  Gastrointestinal: Negative for nausea and abdominal pain.  Genitourinary: Negative.   Musculoskeletal: Negative for joint swelling, arthralgias and neck pain.  Skin: Positive for rash. Negative for wound.  Neurological: Negative for dizziness, weakness, light-headedness, numbness and headaches.  Psychiatric/Behavioral: Negative.       Allergies  Review of patient's allergies indicates no known allergies.  Home Medications   Prior to Admission medications   Medication Sig Start Date End Date Taking? Authorizing Provider  lisinopril (PRINIVIL,ZESTRIL) 10 MG tablet Take 10 mg by mouth daily.   Yes Historical Provider, MD  MAGNESIUM PO Take 1 tablet by mouth daily.   Yes Historical Provider, MD  Multiple Vitamins-Minerals (MULTIVITAMIN PO) Take 1 tablet by mouth daily.   Yes Historical Provider, MD  thiamine 100 MG tablet Take 100 mg by mouth daily.   Yes Historical Provider, MD  docusate sodium (COLACE) 100 MG  capsule Take 1 capsule (100 mg total) by mouth 3 (three) times daily as needed. Patient not taking: Reported on 02/05/2015 09/25/14   Jiles Harold, PA-C  ondansetron (ZOFRAN ODT) 4 MG disintegrating tablet 4mg  ODT q4 hours prn nausea/vomit Patient not taking: Reported on 09/24/2014 08/30/14   10/30/14, MD  oxyCODONE-acetaminophen (ROXICET) 5-325 MG per tablet Take 1-2 tablets by mouth every 4 (four) hours as needed for severe pain. Patient not taking: Reported on 02/05/2015 09/25/14   11/25/14, PA-C  predniSONE (DELTASONE) 10 MG  tablet 6, 5, 4, 3, 2 then 1 tablet by mouth daily for 6 days total. 02/05/15   02/07/15, PA-C  sulfamethoxazole-trimethoprim (BACTRIM DS,SEPTRA DS) 800-160 MG tablet Take 1 tablet by mouth 2 (two) times daily. 02/05/15 02/12/15  02/14/15, PA-C   BP 111/72 mmHg  Pulse 114  Temp(Src) 97.7 F (36.5 C) (Oral)  Resp 20  Ht 6\' 2"  (1.88 m)  Wt 140 lb (63.504 kg)  BMI 17.97 kg/m2  SpO2 100% Physical Exam  Constitutional: He appears well-developed and well-nourished.  Appears anxious.  Actively scratching his rash.  HENT:  Head: Normocephalic and atraumatic.  Eyes: Conjunctivae are normal.  Neck: Normal range of motion.  Cardiovascular: Regular rhythm, normal heart sounds and intact distal pulses.   Pulmonary/Chest: Effort normal. He has no wheezes. He has no rales.  Distant breath sounds throughout all lung fields.  No wheezing.  Abdominal: Bowel sounds are normal.  Musculoskeletal: Normal range of motion.  Neurological: He is alert.  Skin: Skin is warm. There is erythema.  Slightly raised erythematous near confluent rash in places, involving entirety of upper back from scapula through neck, also on anterior chest.  There is a distinct line on his chest where the rash and, not consistent with clothing line.  The rash on his back tapers off with few scattered lesions mid back.  There are no pustules, no vesicles.  Some of the sites appear slightly moist, there is also scattered bleeding excoriations.  Small nontender nodule left medial knee, no erythema, no fluctuance or erythema.   Psychiatric: He has a normal mood and affect.  Nursing note and vitals reviewed.   ED Course  Procedures (including critical care time) Labs Review Labs Reviewed - No data to display  Imaging Review No results found. I have personally reviewed and evaluated these images and lab results as part of my medical decision-making.   EKG Interpretation None      MDM   Final diagnoses:  Dermatitis     Excoriated, erythematous rash upper torso c/w contact dermatitis, possible superimposed cellulitis given degree of erythema, several lesions are moist, multiple areas of excoriations.  No exam findings or hx to suggest tick borne complication.  He was placed on prednisone taper (decadron injection given here), also applied benadryl/zinc oxide cream with immediate improvement in pain and itching, tube given pt.  He was placed on bactrim to cover for skin infection.  Pt seen by Dr. Burgess Amor during todays visit.    , PA-C 02/06/15 Burgess Amor  02/08/15, MD 02/08/15 [phone]

## 2015-02-05 NOTE — Discharge Instructions (Signed)
Rash A rash is a change in the color or texture of the skin. There are many different types of rashes. You may have other problems that accompany your rash. CAUSES   Infections.  Allergic reactions. This can include allergies to pets or foods.  Certain medicines.  Exposure to certain chemicals, soaps, or cosmetics.  Heat.  Exposure to poisonous plants.  Tumors, both cancerous and noncancerous. SYMPTOMS   Redness.  Scaly skin.  Itchy skin.  Dry or cracked skin.  Bumps.  Blisters.  Pain. DIAGNOSIS  Your caregiver may do a physical exam to determine what type of rash you have. A skin sample (biopsy) may be taken and examined under a microscope. TREATMENT  Treatment depends on the type of rash you have. Your caregiver may prescribe certain medicines. For serious conditions, you may need to see a skin doctor (dermatologist). HOME CARE INSTRUCTIONS   Avoid the substance that caused your rash.  Do not scratch your rash. This can cause infection.  You may take cool baths to help stop itching.  Only take over-the-counter or prescription medicines as directed by your caregiver.  Keep all follow-up appointments as directed by your caregiver. SEEK IMMEDIATE MEDICAL CARE IF:  You have increasing pain, swelling, or redness.  You have a fever.  You have new or severe symptoms.  You have body aches, diarrhea, or vomiting.  Your rash is not better after 3 days. MAKE SURE YOU:  Understand these instructions.  Will watch your condition.  Will get help right away if you are not doing well or get worse.   This information is not intended to replace advice given to you by your health care provider. Make sure you discuss any questions you have with your health care provider.    Start taking your prednisone tomorrow morning as the shot you received today will cover you for today's dose.  You may apply the Benadryl cream that you were given up to 3 times daily if needed  for itching.

## 2015-02-05 NOTE — ED Notes (Signed)
PA at bedside.

## 2015-02-05 NOTE — ED Notes (Signed)
Pt reports he needs help to get any prescriptions he is prescribed today because he doesn't have any money until Nov. 1. PA aware. Case Manager called and they cannot give any medication voucher because pt has [facility].

## 2015-05-08 DIAGNOSIS — I10 Essential (primary) hypertension: Secondary | ICD-10-CM | POA: Diagnosis not present

## 2015-05-08 DIAGNOSIS — Z681 Body mass index (BMI) 19 or less, adult: Secondary | ICD-10-CM | POA: Diagnosis not present

## 2015-05-08 DIAGNOSIS — F17200 Nicotine dependence, unspecified, uncomplicated: Secondary | ICD-10-CM | POA: Diagnosis not present

## 2015-05-11 DIAGNOSIS — J449 Chronic obstructive pulmonary disease, unspecified: Secondary | ICD-10-CM | POA: Diagnosis not present

## 2015-05-11 DIAGNOSIS — R05 Cough: Secondary | ICD-10-CM | POA: Diagnosis not present

## 2015-05-11 DIAGNOSIS — I10 Essential (primary) hypertension: Secondary | ICD-10-CM | POA: Diagnosis not present

## 2015-05-11 DIAGNOSIS — M069 Rheumatoid arthritis, unspecified: Secondary | ICD-10-CM | POA: Diagnosis not present

## 2015-06-02 DIAGNOSIS — J449 Chronic obstructive pulmonary disease, unspecified: Secondary | ICD-10-CM | POA: Diagnosis not present

## 2015-06-02 DIAGNOSIS — M069 Rheumatoid arthritis, unspecified: Secondary | ICD-10-CM | POA: Diagnosis not present

## 2015-06-02 DIAGNOSIS — R7301 Impaired fasting glucose: Secondary | ICD-10-CM | POA: Diagnosis not present

## 2015-06-02 DIAGNOSIS — I10 Essential (primary) hypertension: Secondary | ICD-10-CM | POA: Diagnosis not present

## 2015-10-05 IMAGING — RF DG SHOULDER 2+V*R*
1 series · 2 of 2 positions shown · IV contrast (agent unspecified)
Comparison: Right humerus series 6497

CLINICAL DATA: 63-year-old male undergoing proximal right humerus
ORIF. Initial encounter.

EXAM:
DG C-ARM 61-120 MIN; RIGHT SHOULDER - 2+ VIEW
TECHNIQUE: Two intraoperative fluoroscopic views of the proximal right humerus
CONTRAST:  None.
FLUOROSCOPY TIME:  0 minutes 17 seconds

[Series 1: run · 2 of 2 slices shown]
[im 1/2]
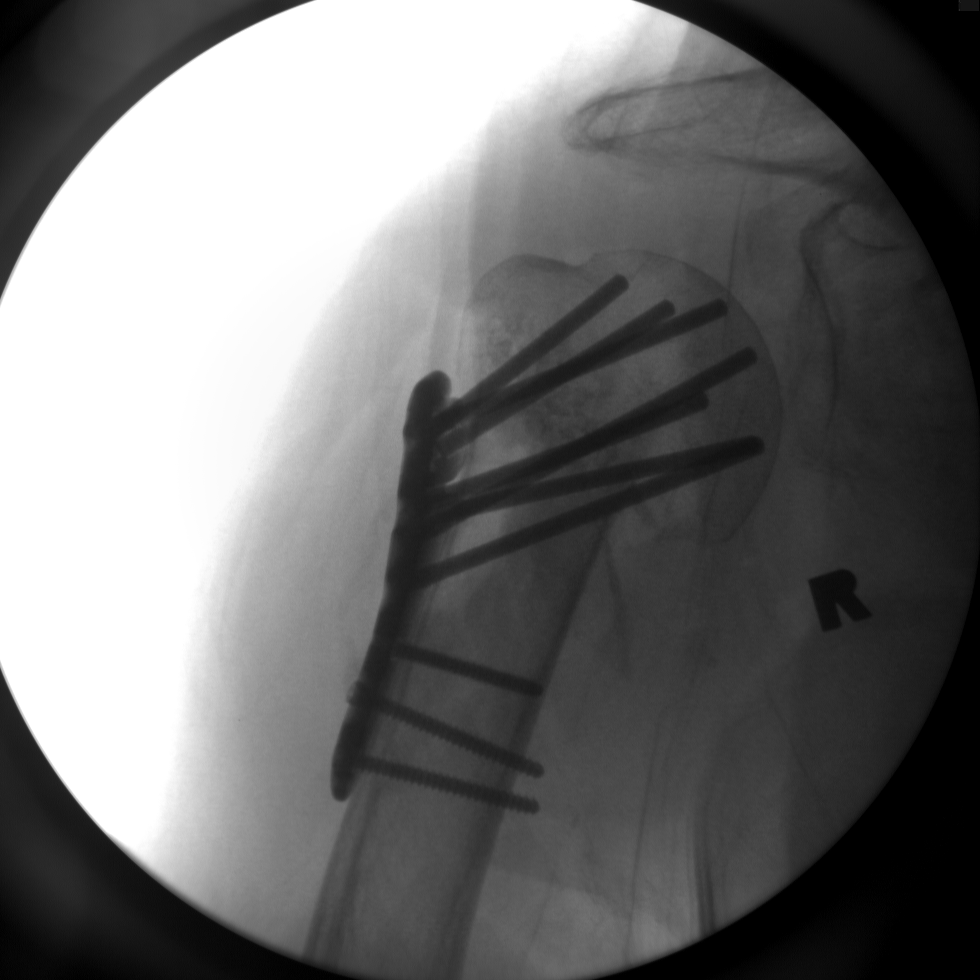
[im 2/2]
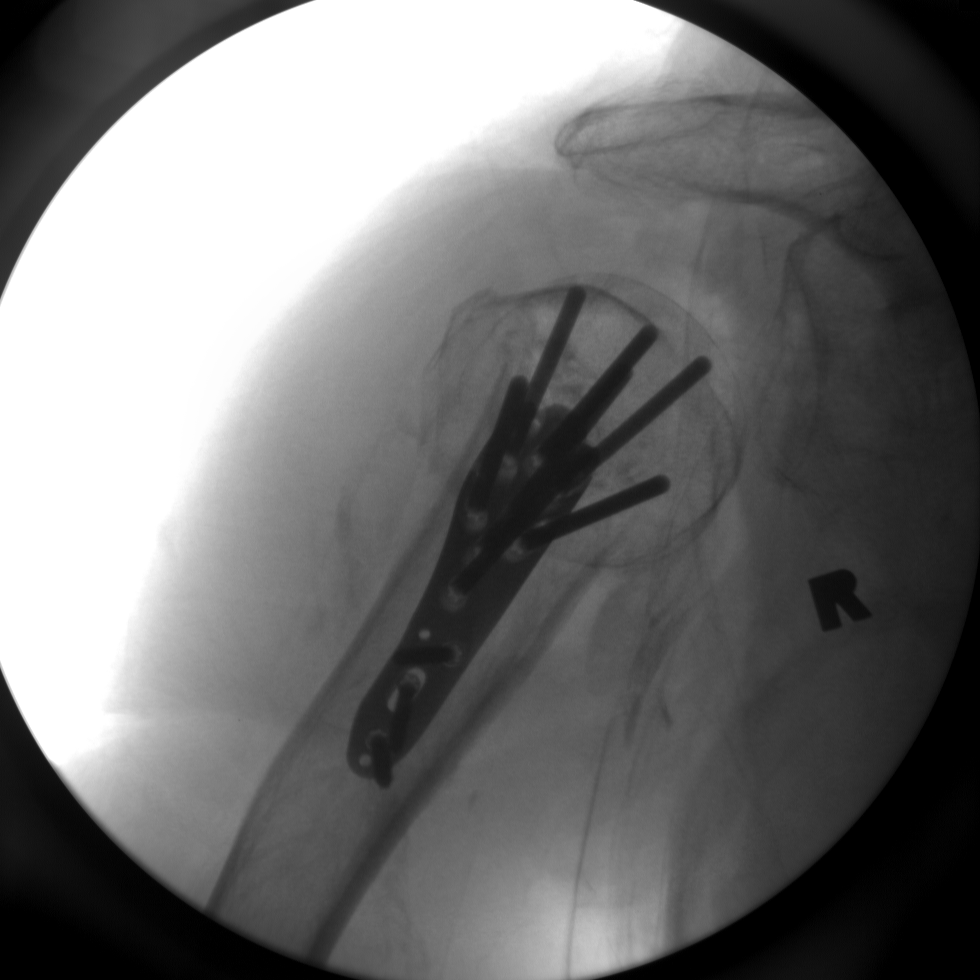

[2 of 2 positions shown; findings below may reference images not displayed]

FINDINGS: Lateral plate with distal cortical screws and numerous humeral head
screws or rods are in place. Significantly improved alignment about
the severely comminuted proximal right humerus fracture.
Glenohumeral alignment appears preserved. Hardware appears intact.
IMPRESSION: ORIF proximal right humerus with no adverse features.

## 2015-11-05 ENCOUNTER — Emergency Department (HOSPITAL_COMMUNITY)
Admission: EM | Admit: 2015-11-05 | Discharge: 2015-11-05 | Disposition: A | Discharge status: Home / Self Care | Payer: Commercial Managed Care - HMO | Attending: Emergency Medicine | Admitting: Emergency Medicine

## 2015-11-05 ENCOUNTER — Encounter (HOSPITAL_COMMUNITY): Payer: Self-pay | Admitting: Cardiology

## 2015-11-05 DIAGNOSIS — M1990 Unspecified osteoarthritis, unspecified site: Secondary | ICD-10-CM | POA: Insufficient documentation

## 2015-11-05 DIAGNOSIS — W260XXA Contact with knife, initial encounter: Secondary | ICD-10-CM | POA: Diagnosis not present

## 2015-11-05 DIAGNOSIS — F17210 Nicotine dependence, cigarettes, uncomplicated: Secondary | ICD-10-CM | POA: Diagnosis not present

## 2015-11-05 DIAGNOSIS — Y9389 Activity, other specified: Secondary | ICD-10-CM | POA: Diagnosis not present

## 2015-11-05 DIAGNOSIS — Z79899 Other long term (current) drug therapy: Secondary | ICD-10-CM | POA: Insufficient documentation

## 2015-11-05 DIAGNOSIS — Y92009 Unspecified place in unspecified non-institutional (private) residence as the place of occurrence of the external cause: Secondary | ICD-10-CM | POA: Insufficient documentation

## 2015-11-05 DIAGNOSIS — S61219A Laceration without foreign body of unspecified finger without damage to nail, initial encounter: Secondary | ICD-10-CM

## 2015-11-05 DIAGNOSIS — S61217A Laceration without foreign body of left little finger without damage to nail, initial encounter: Secondary | ICD-10-CM | POA: Insufficient documentation

## 2015-11-05 DIAGNOSIS — J449 Chronic obstructive pulmonary disease, unspecified: Secondary | ICD-10-CM | POA: Insufficient documentation

## 2015-11-05 DIAGNOSIS — E039 Hypothyroidism, unspecified: Secondary | ICD-10-CM | POA: Insufficient documentation

## 2015-11-05 DIAGNOSIS — I10 Essential (primary) hypertension: Secondary | ICD-10-CM | POA: Insufficient documentation

## 2015-11-05 DIAGNOSIS — Y999 Unspecified external cause status: Secondary | ICD-10-CM | POA: Diagnosis not present

## 2015-11-05 DIAGNOSIS — S66527A Laceration of intrinsic muscle, fascia and tendon of left little finger at wrist and hand level, initial encounter: Secondary | ICD-10-CM | POA: Diagnosis not present

## 2015-11-05 MED ORDER — TETANUS-DIPHTH-ACELL PERTUSSIS 5-2.5-18.5 LF-MCG/0.5 IM SUSP
0.5000 mL | Freq: Once | INTRAMUSCULAR | Status: AC
  Administered 2015-11-05: 0.5 mL via INTRAMUSCULAR
  Filled 2015-11-05: qty 0.5

## 2015-11-05 MED ORDER — LIDOCAINE HCL (PF) 1 % IJ SOLN
INTRAMUSCULAR | Status: AC
  Administered 2015-11-05: 2 mL
  Filled 2015-11-05: qty 5

## 2015-11-05 MED ORDER — LIDOCAINE HCL (PF) 1 % IJ SOLN
2.0000 mL | Freq: Once | INTRAMUSCULAR | Status: AC
  Administered 2015-11-05: 2 mL

## 2015-11-05 NOTE — ED Notes (Signed)
Laceration to left pinky finger with knife today.

## 2015-11-05 NOTE — ED Provider Notes (Signed)
CSN: [id number]     Arrival date & time 11/05/15  1212 History   First MD Initiated Contact with Patient 11/05/15 1238     Chief Complaint  Patient presents with  . Laceration     (Consider location/radiation/quality/duration/timing/severity/associated sxs/prior Treatment) The history is provided by the patient.   Larry [name] is a 65 y.o. male presenting with laceration to his left fifth finger.  He was working on a home project when he cut the finger using a knife.  He has applied pressure and obtain hemostasis.  He reports minimal pain and denies numbness distal to the injury site.  He is not up-to-date with his tetanus.  He has no other complaints today.    Past Medical History  Diagnosis Date  . Shingles   . Alcohol abuse   . 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 Thyroid disease     hypothyroidism  . Arthritis   . Hypertension   . Shortness of breath dyspnea     SOB with exertion   Past Surgical History  Procedure Laterality Date  . Knee surgery    . Tonsillectomy    . Cataract extraction w/ intraocular lens  implant, bilateral    . Orif humerus fracture Right 09/24/2014    Procedure: OPEN REDUCTION INTERNAL FIXATION (ORIF) PROXIMAL HUMERUS FRACTURE;  Surgeon: Jones Broom, MD;  Location: [facility] OR;  Service: Orthopedics;  Laterality: Right;   Family History  Problem Relation Age of Onset  . Thyroid disease Mother   . Diabetes Other    Social History  Substance Use Topics  . Smoking status: Current Every Day Smoker -- 0.50 packs/day    Types: Cigarettes  . Smokeless tobacco: None  . Alcohol Use: Yes     Comment: 1/2 pint liquor daily    Review of Systems  Constitutional: Negative for fever and chills.  Respiratory: Negative for shortness of breath and wheezing.   Skin: Positive for wound.  Neurological: Negative for numbness.      Allergies  Review of patient's allergies indicates no known allergies.  Home Medications    Prior to Admission medications   Medication Sig Start Date End Date Taking? Authorizing Provider  lisinopril (PRINIVIL,ZESTRIL) 10 MG tablet Take 10 mg by mouth daily.   Yes Historical Provider, MD   BP 127/73 mmHg  Pulse 95  Temp(Src) 98.4 F (36.9 C) (Oral)  Resp 16  Ht 6\' 2"  (1.88 m)  Wt 64.864 kg  BMI 18.35 kg/m2  SpO2 100% Physical Exam  Constitutional: He is oriented to person, place, and time. He appears well-developed and well-nourished.  HENT:  Head: Normocephalic.  Cardiovascular: Normal rate.   Pulmonary/Chest: Effort normal.  Neurological: He is alert and oriented to person, place, and time. No sensory deficit.  Skin: Laceration noted.  2 cm laceration left lateral distal fifth finger which is hemostatic, subcutaneous.  There is no deep structure visualized.  Distal sensation is intact with less than 2 second cap refill.    ED Course  Procedures (including critical care time)  LACERATION REPAIR Performed by: Authorized by: Burgess Amor Consent: Verbal consent obtained. Risks and benefits: risks, benefits and alternatives were discussed Consent given by: patient Patient identity confirmed: provided demographic data Prepped and Draped in normal sterile fashion Wound explored  Laceration Location: left 5th finger  Laceration Length:  2cm  No Foreign Bodies seen or palpated  Anesthesia: local infiltration  Local anesthetic: lidocaine 1% without  epinephrine  Anesthetic total: 3 ml  Irrigation method: syringe Amount of cleaning: standard  Skin closure: prolene 4-0  Number of sutures: 4  Technique: Simple interrupted   Patient tolerance: Patient tolerated the procedure well with no immediate complications.  Labs Review Labs Reviewed - No data to display  Imaging Review No results found. I have personally reviewed and evaluated these images and lab results as part of my medical decision-making.   EKG Interpretation None       MDM   Final diagnoses:  Finger laceration, initial encounter    Wound care instructions given.  Pt advised to have sutures removed in 10 days,  Return here sooner for any signs of infection including redness, swelling, worse pain or drainage of pus.  Tetanus updated.       Burgess Amor, PA-C 11/05/15 1420  Samuel Jester, DO 11/07/15 [phone]

## 2015-11-05 NOTE — Discharge Instructions (Signed)

## 2015-12-13 DIAGNOSIS — I10 Essential (primary) hypertension: Secondary | ICD-10-CM | POA: Diagnosis not present

## 2015-12-13 DIAGNOSIS — E785 Hyperlipidemia, unspecified: Secondary | ICD-10-CM | POA: Diagnosis not present

## 2015-12-15 DIAGNOSIS — I10 Essential (primary) hypertension: Secondary | ICD-10-CM | POA: Diagnosis not present

## 2015-12-15 DIAGNOSIS — J449 Chronic obstructive pulmonary disease, unspecified: Secondary | ICD-10-CM | POA: Diagnosis not present

## 2015-12-15 DIAGNOSIS — E441 Mild protein-calorie malnutrition: Secondary | ICD-10-CM | POA: Diagnosis not present

## 2015-12-15 DIAGNOSIS — D508 Other iron deficiency anemias: Secondary | ICD-10-CM | POA: Diagnosis not present

## 2015-12-15 DIAGNOSIS — M069 Rheumatoid arthritis, unspecified: Secondary | ICD-10-CM | POA: Diagnosis not present

## 2016-04-29 ENCOUNTER — Encounter (HOSPITAL_COMMUNITY): Payer: Self-pay | Admitting: *Deleted

## 2016-04-29 ENCOUNTER — Emergency Department (HOSPITAL_COMMUNITY)
Admission: EM | Admit: 2016-04-29 | Discharge: 2016-04-29 | Disposition: A | Discharge status: Home / Self Care | Payer: Medicare HMO | Attending: Emergency Medicine | Admitting: Emergency Medicine

## 2016-04-29 DIAGNOSIS — Y9389 Activity, other specified: Secondary | ICD-10-CM | POA: Insufficient documentation

## 2016-04-29 DIAGNOSIS — Y999 Unspecified external cause status: Secondary | ICD-10-CM | POA: Insufficient documentation

## 2016-04-29 DIAGNOSIS — F17210 Nicotine dependence, cigarettes, uncomplicated: Secondary | ICD-10-CM | POA: Insufficient documentation

## 2016-04-29 DIAGNOSIS — J449 Chronic obstructive pulmonary disease, unspecified: Secondary | ICD-10-CM | POA: Insufficient documentation

## 2016-04-29 DIAGNOSIS — Y92009 Unspecified place in unspecified non-institutional (private) residence as the place of occurrence of the external cause: Secondary | ICD-10-CM | POA: Diagnosis not present

## 2016-04-29 DIAGNOSIS — Z79899 Other long term (current) drug therapy: Secondary | ICD-10-CM | POA: Insufficient documentation

## 2016-04-29 DIAGNOSIS — W5501XA Bitten by cat, initial encounter: Secondary | ICD-10-CM | POA: Insufficient documentation

## 2016-04-29 DIAGNOSIS — S61532A Puncture wound without foreign body of left wrist, initial encounter: Secondary | ICD-10-CM | POA: Diagnosis not present

## 2016-04-29 DIAGNOSIS — Z23 Encounter for immunization: Secondary | ICD-10-CM | POA: Diagnosis not present

## 2016-04-29 DIAGNOSIS — S61552A Open bite of left wrist, initial encounter: Secondary | ICD-10-CM | POA: Diagnosis present

## 2016-04-29 DIAGNOSIS — E039 Hypothyroidism, unspecified: Secondary | ICD-10-CM | POA: Insufficient documentation

## 2016-04-29 DIAGNOSIS — I10 Essential (primary) hypertension: Secondary | ICD-10-CM | POA: Diagnosis not present

## 2016-04-29 MED ORDER — AMOXICILLIN-POT CLAVULANATE 875-125 MG PO TABS: 1.0000 | ORAL_TABLET | Freq: Two times a day (BID) | ORAL | 0 refills | Status: DC

## 2016-04-29 MED ORDER — AMOXICILLIN-POT CLAVULANATE 875-125 MG PO TABS
1.0000 | ORAL_TABLET | Freq: Once | ORAL | Status: AC
  Administered 2016-04-29: 1 via ORAL
  Filled 2016-04-29: qty 1

## 2016-04-29 MED ORDER — BACITRACIN ZINC 500 UNIT/GM EX OINT
TOPICAL_OINTMENT | CUTANEOUS | Status: AC
  Filled 2016-04-29: qty 0.9

## 2016-04-29 MED ORDER — TETANUS-DIPHTH-ACELL PERTUSSIS 5-2.5-18.5 LF-MCG/0.5 IM SUSP
0.5000 mL | Freq: Once | INTRAMUSCULAR | Status: AC
  Administered 2016-04-29: 0.5 mL via INTRAMUSCULAR
  Filled 2016-04-29: qty 0.5

## 2016-04-29 NOTE — ED Triage Notes (Signed)
Pt reports he was bitten by a wild cat today on the left wrist. Pt reports he "wants a tetanus shot and that's it".

## 2016-04-29 NOTE — ED Notes (Signed)
TT in to assess pt 

## 2016-04-29 NOTE — ED Provider Notes (Signed)
[facility] DEPT Provider Note   CSN: [id number] Arrival date & time: 04/29/16  1448     History   Chief Complaint Chief Complaint  Patient presents with  . Animal Bite    HPI Larry [name] is a 66 y.o. male.  HPI   Larry [name] is a 66 y.o. male who presents to the Emergency Department complaining of a recent cat bite to his left hand and wrist.  He states that a stray cat bit him when he was trying to prevent the stray from mating with his cat.  He reports a bite and a scratch.  He states that he "let the area bleed for a while then I poured alcohol on it" he denies redness, numbness or difficulty with movement of the fingers, wrist or gripping objects.  He states that all he wants is a tetanus injection.  He denies pain at this time, hx of DM or use of blood thinners. Incident has not been reported.   Past Medical History:  Diagnosis Date  . Alcohol abuse   . Arthritis   . 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 Hypertension   . Shingles   . Shortness of breath dyspnea    SOB with exertion  . Thyroid disease    hypothyroidism    Patient Active Problem List   Diagnosis Date Noted  . Proximal humerus fracture 09/24/2014    Past Surgical History:  Procedure Laterality Date  . CATARACT EXTRACTION W/ INTRAOCULAR LENS  IMPLANT, BILATERAL    . KNEE SURGERY    . ORIF HUMERUS FRACTURE Right 09/24/2014   Procedure: OPEN REDUCTION INTERNAL FIXATION (ORIF) PROXIMAL HUMERUS FRACTURE;  Surgeon: Jones Broom, MD;  Location: [facility] OR;  Service: Orthopedics;  Laterality: Right;  . TONSILLECTOMY         Home Medications    Prior to Admission medications   Medication Sig Start Date End Date Taking? Authorizing Provider  lisinopril (PRINIVIL,ZESTRIL) 10 MG tablet Take 10 mg by mouth daily.    Historical Provider, MD    Family History Family History  Problem Relation Age of Onset  . Thyroid disease Mother   . Diabetes Other      Social History Social History  Substance Use Topics  . Smoking status: Current Every Day Smoker    Packs/day: 1.00    Types: Cigarettes  . Smokeless tobacco: Never Used  . Alcohol use Yes     Comment: drinks 1/5 daily     Allergies   Patient has no known allergies.   Review of Systems Review of Systems  Constitutional: Negative for chills and fever.  Gastrointestinal: Negative for nausea and vomiting.  Musculoskeletal: Negative for arthralgias and joint swelling.  Skin: Positive for wound. Negative for color change.       Cat bite and scratches of the left wrist and hand  Neurological: Negative for dizziness, weakness and numbness.  Hematological: Does not bruise/bleed easily.  All other systems reviewed and are negative.    Physical Exam Updated Vital Signs BP 125/94 (BP Location: Right Arm)   Pulse 107   Temp 97.8 F (36.6 C) (Oral)   Resp 16   Ht 6\' 2"  (1.88 m)   Wt 61.7 kg   SpO2 98%   BMI 17.46 kg/m   Physical Exam  Constitutional: He is oriented to person, place, and time. He appears well-developed and well-nourished. No distress.  HENT:  Head: Normocephalic and atraumatic.  Mouth/Throat: Oropharynx  is clear and moist.  Neck: Normal range of motion. Neck supple.  Cardiovascular: Normal rate, regular rhythm and intact distal pulses.   No murmur heard. Pulmonary/Chest: Effort normal and breath sounds normal. No respiratory distress.  Musculoskeletal: Normal range of motion. He exhibits tenderness. He exhibits no edema.       Left hand: He exhibits tenderness and swelling. He exhibits normal range of motion and normal two-point discrimination. Normal sensation noted. Normal strength noted. He exhibits no finger abduction, no thumb/finger opposition and no wrist extension trouble.       Hands: Small puncture wounds of the distal left wrist and thenar eminence of the left thumb.  Small amt of focal edema at the wrist, no bleeding.  Pt has full ROM of the  fingers and wrist.  Sensation intact.    Lymphadenopathy:    He has no cervical adenopathy.  Neurological: He is alert and oriented to person, place, and time. He exhibits normal muscle tone. Coordination normal.  Skin: Skin is warm. No rash noted. No erythema.  Nursing note and vitals reviewed.    ED Treatments / Results  Labs (all labs ordered are listed, but only abnormal results are displayed) Labs Reviewed - No data to display  EKG  EKG Interpretation None       Radiology No results found.  Procedures Procedures (including critical care time)  WOUND CLEANING: Puncture wounds of the left hand and wrist was cleaned thoroughly by me using nml saline and pressure irrigation with a 20 cc syringe.  Area cleaned with betadine.  Wounds left open.    Patient tolerance: Patient tolerated the procedure well with no immediate complications.   Medications Ordered in ED Medications  Tdap (BOOSTRIX) injection 0.5 mL (not administered)  amoxicillin-clavulanate (AUGMENTIN) 875-125 MG per tablet 1 tablet (not administered)     Initial Impression / Assessment and Plan / ED Course  I have reviewed the triage vital signs and the nursing notes.  Pertinent labs & imaging results that were available during my care of the patient were reviewed by me and considered in my medical decision making (see chart for details).  Clinical Course     1645  [place] animal control here to take report.    Pt refusing rabies vaccinations.  He is adamantly stating the cat has been to his house many times and does not have rabies.  I have explained the possible outcomes and risks involved in developing rabies including death. I have also explained the high possibility of infection associated with cat bites. Pt verbalized understanding and continues to refuse the vaccines, but agrees to start po abx.  My recommendations and discussion of possible risks was witnessed by the nursing staff as well as  the patient's refusal.    Wounds were cleaned as listed above, td updated.  Pt agreeable to antibiotic and close PMD f/u or to return here if signs of infection develop.    Final Clinical Impressions(s) / ED Diagnoses   Final diagnoses:  Cat bite, initial encounter    New Prescriptions New Prescriptions   No medications on file     Pauline Aus, PA-C 05/03/16 1742    Bethann Berkshire, MD 05/05/16 [phone]

## 2016-04-29 NOTE — ED Notes (Signed)
Animal control notified- will send officer

## 2016-04-29 NOTE — ED Notes (Signed)
Pt reports that his cat is in heat- that another cat come into his home and when he reached for it bit him on his wrist. - He reports that he let it bleed and then washed with alcohol.  Reports. "all I'm here for is a tetanus shot".  Superficial bites to his wrist

## 2016-04-29 NOTE — ED Notes (Signed)
Pt reports that he had a rabies vaccination about 1 year ago for a rabid dog  Repeats that he is only here for a tetanus shot

## 2016-04-29 NOTE — Discharge Instructions (Signed)
Keep the wound clean with mild soap and water and keep it bandaged. Return here for any signs of infection or if you change your mind about wanting to receive the rabies vaccinations.

## 2016-06-28 DIAGNOSIS — I10 Essential (primary) hypertension: Secondary | ICD-10-CM | POA: Diagnosis not present

## 2016-06-28 DIAGNOSIS — R7301 Impaired fasting glucose: Secondary | ICD-10-CM | POA: Diagnosis not present

## 2016-06-28 DIAGNOSIS — D508 Other iron deficiency anemias: Secondary | ICD-10-CM | POA: Diagnosis not present

## 2016-06-30 DIAGNOSIS — J449 Chronic obstructive pulmonary disease, unspecified: Secondary | ICD-10-CM | POA: Diagnosis not present

## 2016-06-30 DIAGNOSIS — E441 Mild protein-calorie malnutrition: Secondary | ICD-10-CM | POA: Diagnosis not present

## 2016-06-30 DIAGNOSIS — I10 Essential (primary) hypertension: Secondary | ICD-10-CM | POA: Diagnosis not present

## 2016-06-30 DIAGNOSIS — R7301 Impaired fasting glucose: Secondary | ICD-10-CM | POA: Diagnosis not present

## 2016-08-01 DIAGNOSIS — R21 Rash and other nonspecific skin eruption: Secondary | ICD-10-CM | POA: Diagnosis not present

## 2016-12-05 DIAGNOSIS — R4781 Slurred speech: Secondary | ICD-10-CM | POA: Diagnosis not present

## 2016-12-05 DIAGNOSIS — F29 Unspecified psychosis not due to a substance or known physiological condition: Secondary | ICD-10-CM | POA: Diagnosis not present

## 2016-12-05 DIAGNOSIS — Z5321 Procedure and treatment not carried out due to patient leaving prior to being seen by health care provider: Secondary | ICD-10-CM | POA: Diagnosis not present

## 2016-12-05 DIAGNOSIS — F1010 Alcohol abuse, uncomplicated: Secondary | ICD-10-CM | POA: Diagnosis not present

## 2016-12-05 DIAGNOSIS — I10 Essential (primary) hypertension: Secondary | ICD-10-CM | POA: Diagnosis not present

## 2016-12-28 DIAGNOSIS — R7301 Impaired fasting glucose: Secondary | ICD-10-CM | POA: Diagnosis not present

## 2016-12-28 DIAGNOSIS — D508 Other iron deficiency anemias: Secondary | ICD-10-CM | POA: Diagnosis not present

## 2016-12-28 DIAGNOSIS — I10 Essential (primary) hypertension: Secondary | ICD-10-CM | POA: Diagnosis not present

## 2017-01-01 DIAGNOSIS — I10 Essential (primary) hypertension: Secondary | ICD-10-CM | POA: Diagnosis not present

## 2017-01-01 DIAGNOSIS — F1010 Alcohol abuse, uncomplicated: Secondary | ICD-10-CM | POA: Diagnosis not present

## 2017-01-01 DIAGNOSIS — J449 Chronic obstructive pulmonary disease, unspecified: Secondary | ICD-10-CM | POA: Diagnosis not present

## 2017-01-01 DIAGNOSIS — J60 Coalworker's pneumoconiosis: Secondary | ICD-10-CM | POA: Diagnosis not present

## 2017-01-01 DIAGNOSIS — Z681 Body mass index (BMI) 19 or less, adult: Secondary | ICD-10-CM | POA: Diagnosis not present

## 2017-09-28 DIAGNOSIS — I10 Essential (primary) hypertension: Secondary | ICD-10-CM | POA: Diagnosis not present

## 2017-09-28 DIAGNOSIS — Z6821 Body mass index (BMI) 21.0-21.9, adult: Secondary | ICD-10-CM | POA: Diagnosis not present

## 2018-01-03 DIAGNOSIS — I10 Essential (primary) hypertension: Secondary | ICD-10-CM | POA: Diagnosis not present

## 2018-01-03 DIAGNOSIS — Z6821 Body mass index (BMI) 21.0-21.9, adult: Secondary | ICD-10-CM | POA: Diagnosis not present

## 2018-01-03 DIAGNOSIS — R7301 Impaired fasting glucose: Secondary | ICD-10-CM | POA: Diagnosis not present

## 2018-01-03 DIAGNOSIS — D508 Other iron deficiency anemias: Secondary | ICD-10-CM | POA: Diagnosis not present

## 2018-01-03 DIAGNOSIS — E441 Mild protein-calorie malnutrition: Secondary | ICD-10-CM | POA: Diagnosis not present

## 2018-01-09 DIAGNOSIS — Z79899 Other long term (current) drug therapy: Secondary | ICD-10-CM | POA: Diagnosis not present

## 2018-01-09 DIAGNOSIS — E782 Mixed hyperlipidemia: Secondary | ICD-10-CM | POA: Diagnosis not present

## 2018-01-09 DIAGNOSIS — D649 Anemia, unspecified: Secondary | ICD-10-CM | POA: Diagnosis not present

## 2018-01-09 DIAGNOSIS — I10 Essential (primary) hypertension: Secondary | ICD-10-CM | POA: Diagnosis not present

## 2018-01-09 DIAGNOSIS — Z125 Encounter for screening for malignant neoplasm of prostate: Secondary | ICD-10-CM | POA: Diagnosis not present

## 2018-01-09 DIAGNOSIS — R5383 Other fatigue: Secondary | ICD-10-CM | POA: Diagnosis not present

## 2018-01-09 DIAGNOSIS — M79621 Pain in right upper arm: Secondary | ICD-10-CM | POA: Diagnosis not present

## 2018-01-09 DIAGNOSIS — Z6822 Body mass index (BMI) 22.0-22.9, adult: Secondary | ICD-10-CM | POA: Diagnosis not present

## 2018-01-09 DIAGNOSIS — Z0000 Encounter for general adult medical examination without abnormal findings: Secondary | ICD-10-CM | POA: Diagnosis not present

## 2018-01-09 DIAGNOSIS — Z6821 Body mass index (BMI) 21.0-21.9, adult: Secondary | ICD-10-CM | POA: Diagnosis not present

## 2018-01-09 DIAGNOSIS — E559 Vitamin D deficiency, unspecified: Secondary | ICD-10-CM | POA: Diagnosis not present

## 2018-01-10 ENCOUNTER — Other Ambulatory Visit (HOSPITAL_COMMUNITY): Payer: Self-pay | Admitting: Internal Medicine

## 2018-01-10 DIAGNOSIS — Z720 Tobacco use: Secondary | ICD-10-CM

## 2018-01-28 ENCOUNTER — Ambulatory Visit (HOSPITAL_COMMUNITY)
Admission: RE | Admit: 2018-01-28 | Discharge: 2018-01-28 | Disposition: A | Discharge status: Home / Self Care | Payer: Medicare HMO | Source: Ambulatory Visit | Attending: Internal Medicine | Admitting: Internal Medicine

## 2018-01-28 DIAGNOSIS — Z87891 Personal history of nicotine dependence: Secondary | ICD-10-CM | POA: Diagnosis not present

## 2018-01-28 DIAGNOSIS — I700 Atherosclerosis of aorta: Secondary | ICD-10-CM | POA: Insufficient documentation

## 2018-01-28 DIAGNOSIS — Z122 Encounter for screening for malignant neoplasm of respiratory organs: Secondary | ICD-10-CM | POA: Insufficient documentation

## 2018-01-28 DIAGNOSIS — J439 Emphysema, unspecified: Secondary | ICD-10-CM | POA: Diagnosis not present

## 2018-01-28 DIAGNOSIS — Z720 Tobacco use: Secondary | ICD-10-CM | POA: Diagnosis not present

## 2018-02-05 ENCOUNTER — Ambulatory Visit: Payer: Commercial Managed Care - HMO

## 2018-02-28 ENCOUNTER — Emergency Department (HOSPITAL_COMMUNITY)
Admission: EM | Admit: 2018-02-28 | Discharge: 2018-02-28 | Disposition: A | Discharge status: Home / Self Care | Payer: Medicare HMO | Attending: Emergency Medicine | Admitting: Emergency Medicine

## 2018-02-28 ENCOUNTER — Other Ambulatory Visit: Payer: Self-pay

## 2018-02-28 ENCOUNTER — Encounter (HOSPITAL_COMMUNITY): Payer: Self-pay | Admitting: Emergency Medicine

## 2018-02-28 ENCOUNTER — Emergency Department (HOSPITAL_COMMUNITY): Payer: Medicare HMO

## 2018-02-28 DIAGNOSIS — J449 Chronic obstructive pulmonary disease, unspecified: Secondary | ICD-10-CM | POA: Insufficient documentation

## 2018-02-28 DIAGNOSIS — Z79899 Other long term (current) drug therapy: Secondary | ICD-10-CM | POA: Insufficient documentation

## 2018-02-28 DIAGNOSIS — I10 Essential (primary) hypertension: Secondary | ICD-10-CM | POA: Diagnosis not present

## 2018-02-28 DIAGNOSIS — W0110XA Fall on same level from slipping, tripping and stumbling with subsequent striking against unspecified object, initial encounter: Secondary | ICD-10-CM | POA: Diagnosis not present

## 2018-02-28 DIAGNOSIS — Y939 Activity, unspecified: Secondary | ICD-10-CM | POA: Diagnosis not present

## 2018-02-28 DIAGNOSIS — S0990XA Unspecified injury of head, initial encounter: Secondary | ICD-10-CM | POA: Diagnosis not present

## 2018-02-28 DIAGNOSIS — R112 Nausea with vomiting, unspecified: Secondary | ICD-10-CM | POA: Diagnosis not present

## 2018-02-28 DIAGNOSIS — R51 Headache: Secondary | ICD-10-CM | POA: Diagnosis not present

## 2018-02-28 DIAGNOSIS — R110 Nausea: Secondary | ICD-10-CM | POA: Diagnosis not present

## 2018-02-28 DIAGNOSIS — M542 Cervicalgia: Secondary | ICD-10-CM | POA: Diagnosis not present

## 2018-02-28 DIAGNOSIS — S199XXA Unspecified injury of neck, initial encounter: Secondary | ICD-10-CM | POA: Diagnosis not present

## 2018-02-28 DIAGNOSIS — E039 Hypothyroidism, unspecified: Secondary | ICD-10-CM | POA: Insufficient documentation

## 2018-02-28 DIAGNOSIS — Y999 Unspecified external cause status: Secondary | ICD-10-CM | POA: Diagnosis not present

## 2018-02-28 DIAGNOSIS — E875 Hyperkalemia: Secondary | ICD-10-CM | POA: Insufficient documentation

## 2018-02-28 DIAGNOSIS — F17210 Nicotine dependence, cigarettes, uncomplicated: Secondary | ICD-10-CM | POA: Diagnosis not present

## 2018-02-28 DIAGNOSIS — Y92008 Other place in unspecified non-institutional (private) residence as the place of occurrence of the external cause: Secondary | ICD-10-CM | POA: Diagnosis not present

## 2018-02-28 DIAGNOSIS — W19XXXA Unspecified fall, initial encounter: Secondary | ICD-10-CM

## 2018-02-28 DIAGNOSIS — R410 Disorientation, unspecified: Secondary | ICD-10-CM | POA: Diagnosis not present

## 2018-02-28 DIAGNOSIS — S0993XA Unspecified injury of face, initial encounter: Secondary | ICD-10-CM | POA: Diagnosis not present

## 2018-02-28 LAB — CBC WITH DIFFERENTIAL/PLATELET
ABS IMMATURE GRANULOCYTES: 0.02 10*3/uL (ref 0.00–0.07)
Basophils Absolute: 0 10*3/uL (ref 0.0–0.1)
Basophils Relative: 0 %
Eosinophils Absolute: 0.1 10*3/uL (ref 0.0–0.5)
Eosinophils Relative: 2 %
HEMATOCRIT: 39.3 % (ref 39.0–52.0)
Hemoglobin: 13.7 g/dL (ref 13.0–17.0)
IMMATURE GRANULOCYTES: 0 %
LYMPHS ABS: 1.4 10*3/uL (ref 0.7–4.0)
Lymphocytes Relative: 19 %
MCH: 32.6 pg (ref 26.0–34.0)
MCHC: 34.9 g/dL (ref 30.0–36.0)
MCV: 93.6 fL (ref 80.0–100.0)
MONO ABS: 0.5 10*3/uL (ref 0.1–1.0)
MONOS PCT: 7 %
NEUTROS ABS: 5.4 10*3/uL (ref 1.7–7.7)
NEUTROS PCT: 72 %
Platelets: 171 10*3/uL (ref 150–400)
RBC: 4.2 MIL/uL — ABNORMAL LOW (ref 4.22–5.81)
RDW: 14 % (ref 11.5–15.5)
WBC: 7.5 10*3/uL (ref 4.0–10.5)
nRBC: 0 % (ref 0.0–0.2)

## 2018-02-28 LAB — I-STAT CHEM 8, ED
BUN: 25 mg/dL — ABNORMAL HIGH (ref 8–23)
CALCIUM ION: 1.07 mmol/L — AB (ref 1.15–1.40)
CHLORIDE: 97 mmol/L — AB (ref 98–111)
Creatinine, Ser: 1 mg/dL (ref 0.61–1.24)
GLUCOSE: 109 mg/dL — AB (ref 70–99)
HCT: 35 % — ABNORMAL LOW (ref 39.0–52.0)
HEMOGLOBIN: 11.9 g/dL — AB (ref 13.0–17.0)
Potassium: 4.4 mmol/L (ref 3.5–5.1)
Sodium: 133 mmol/L — ABNORMAL LOW (ref 135–145)
TCO2: 23 mmol/L (ref 22–32)

## 2018-02-28 LAB — COMPREHENSIVE METABOLIC PANEL
ALK PHOS: 79 U/L (ref 38–126)
ALT: 29 U/L (ref 0–44)
AST: 58 U/L — AB (ref 15–41)
Albumin: 4.4 g/dL (ref 3.5–5.0)
Anion gap: 17 — ABNORMAL HIGH (ref 5–15)
BUN: 28 mg/dL — AB (ref 8–23)
CALCIUM: 9.1 mg/dL (ref 8.9–10.3)
CHLORIDE: 94 mmol/L — AB (ref 98–111)
CO2: 20 mmol/L — AB (ref 22–32)
CREATININE: 1.29 mg/dL — AB (ref 0.61–1.24)
GFR calc Af Amer: >60 mL/min (ref >60)
GFR, EST NON AFRICAN AMERICAN: 56 mL/min — AB (ref >60)
Glucose, Bld: 129 mg/dL — ABNORMAL HIGH (ref 70–99)
Potassium: 5.3 mmol/L — ABNORMAL HIGH (ref 3.5–5.1)
Sodium: 131 mmol/L — ABNORMAL LOW (ref 135–145)
Total Bilirubin: 1.6 mg/dL — ABNORMAL HIGH (ref 0.3–1.2)
Total Protein: 7.4 g/dL (ref 6.5–8.1)

## 2018-02-28 LAB — URINALYSIS, ROUTINE W REFLEX MICROSCOPIC
Bilirubin Urine: NEGATIVE
Glucose, UA: NEGATIVE mg/dL
Hgb urine dipstick: NEGATIVE
Ketones, ur: 20 mg/dL — AB
LEUKOCYTES UA: NEGATIVE
NITRITE: NEGATIVE
PH: 5 (ref 5.0–8.0)
Protein, ur: NEGATIVE mg/dL
SPECIFIC GRAVITY, URINE: 1.021 (ref 1.005–1.030)

## 2018-02-28 LAB — RAPID URINE DRUG SCREEN, HOSP PERFORMED
AMPHETAMINES: NOT DETECTED
BARBITURATES: NOT DETECTED
BENZODIAZEPINES: NOT DETECTED
COCAINE: NOT DETECTED
Opiates: NOT DETECTED
TETRAHYDROCANNABINOL: NOT DETECTED

## 2018-02-28 LAB — CK: Total CK: 87 U/L (ref 49–397)

## 2018-02-28 LAB — LIPASE, BLOOD: Lipase: 31 U/L (ref 11–51)

## 2018-02-28 LAB — ETHANOL

## 2018-02-28 MED ORDER — BACITRACIN ZINC 500 UNIT/GM EX OINT
TOPICAL_OINTMENT | CUTANEOUS | Status: AC
  Administered 2018-02-28: 1
  Filled 2018-02-28: qty 0.9

## 2018-02-28 MED ORDER — SODIUM CHLORIDE 0.9 % IV BOLUS
1000.0000 mL | Freq: Once | INTRAVENOUS | Status: AC
  Administered 2018-02-28: 1000 mL via INTRAVENOUS

## 2018-02-28 NOTE — ED Notes (Signed)
Pt is aware that he is supposed to give [place] a urine sample

## 2018-02-28 NOTE — ED Provider Notes (Signed)
[facility] EMERGENCY DEPARTMENT Provider Note   CSN: [id number] Arrival date & time: 02/28/18  1221     History   Chief Complaint Chief Complaint  Patient presents with  . Fall    HPI WALE FERRELLI is a 67 y.o. male.  HPI Patient presents after a fall with pain in his face Patient acknowledges multiple medical issues including alcohol abuse per 2 days ago he had a fall, after drinking substantially. He notes that since that fall he has been on the ground, due to pain and weakness. Pain is primarily about his head, face, neck, though it is unclear if this is similar to his chronic pain or new. Patient was found on the ground by a neighbor, caked in blood, brought here for evaluation. Patient denies new extremity weakness, new confusion, acknowledges lightheadedness, acknowledges pain, as above.  Past Medical History:  Diagnosis Date  . Alcohol abuse   . Arthritis   . 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 Hypertension   . Shingles   . Shortness of breath dyspnea    SOB with exertion  . Thyroid disease    hypothyroidism    Patient Active Problem List   Diagnosis Date Noted  . Proximal humerus fracture 09/24/2014    Past Surgical History:  Procedure Laterality Date  . CATARACT EXTRACTION W/ INTRAOCULAR LENS  IMPLANT, BILATERAL    . KNEE SURGERY    . ORIF HUMERUS FRACTURE Right 09/24/2014   Procedure: OPEN REDUCTION INTERNAL FIXATION (ORIF) PROXIMAL HUMERUS FRACTURE;  Surgeon: Jones Broom, MD;  Location: [facility] OR;  Service: Orthopedics;  Laterality: Right;  . TONSILLECTOMY          Home Medications    Prior to Admission medications   Medication Sig Start Date End Date Taking? Authorizing Provider  lisinopril-hydrochlorothiazide (PRINZIDE,ZESTORETIC) 10-12.5 MG tablet Take by mouth daily.  11/27/17  Yes [provider]    Family History Family History  Problem Relation Age of Onset  . Thyroid disease Mother   .  Diabetes Other     Social History Social History   Tobacco Use  . Smoking status: Current Every Day Smoker    Packs/day: 0.50    Types: Cigarettes  . Smokeless tobacco: Never Used  Substance Use Topics  . Alcohol use: Yes    Comment: drinks 1/5 daily  . Drug use: No     Allergies   Patient has no known allergies.   Review of Systems Review of Systems  Constitutional:       Per HPI, otherwise negative  HENT:       Per HPI, otherwise negative  Respiratory:       Per HPI, otherwise negative  Cardiovascular:       Per HPI, otherwise negative  Gastrointestinal: Negative for vomiting.  Endocrine:       Negative aside from HPI  Genitourinary:       Neg aside from HPI   Musculoskeletal:       Per HPI, otherwise negative  Skin: Positive for color change and wound.  Neurological: Positive for weakness. Negative for syncope.  Psychiatric/Behavioral: Positive for dysphoric mood.     Physical Exam Updated Vital Signs BP 125/64   Pulse 89   Temp 98.2 F (36.8 C) (Oral)   Resp 12   Ht 6\' 2"  (1.88 m)   Wt 61.2 kg   SpO2 99%   BMI 17.33 kg/m   Physical Exam  Constitutional: He is  oriented to person, place, and time. He appears well-developed. No distress.  Sickly appearing elderly male with substantial blood about his upper left face, head.  HENT:  Head: Normocephalic.    Eyes: Conjunctivae and EOM are normal.  Neck: Neck supple.  No appreciable deformity, no crepitus, but the patient describes pain throughout his neck.   Cardiovascular: Normal rate and regular rhythm.  Pulmonary/Chest: Effort normal. No stridor. No respiratory distress.  Abdominal: He exhibits no distension. There is no tenderness.  Musculoskeletal: He exhibits no edema.  Neurological: He is alert and oriented to person, place, and time.  Skin: Skin is warm and dry.  Psychiatric: He has a normal mood and affect.  Nursing note and vitals reviewed.    ED Treatments / Results  Labs (all  labs ordered are listed, but only abnormal results are displayed) Labs Reviewed  COMPREHENSIVE METABOLIC PANEL - Abnormal; Notable for the following components:      Result Value   Sodium 131 (*)    Potassium 5.3 (*)    Chloride 94 (*)    CO2 20 (*)    Glucose, Bld 129 (*)    BUN 28 (*)    Creatinine, Ser 1.29 (*)    AST 58 (*)    Total Bilirubin 1.6 (*)    GFR calc non Af Amer 56 (*)    Anion gap 17 (*)    All other components within normal limits  CBC WITH DIFFERENTIAL/PLATELET - Abnormal; Notable for the following components:   RBC 4.20 (*)    All other components within normal limits  URINALYSIS, ROUTINE W REFLEX MICROSCOPIC - Abnormal; Notable for the following components:   APPearance HAZY (*)    Ketones, ur 20 (*)    All other components within normal limits  I-STAT CHEM 8, ED - Abnormal; Notable for the following components:   Sodium 133 (*)    Chloride 97 (*)    BUN 25 (*)    Glucose, Bld 109 (*)    Calcium, Ion 1.07 (*)    Hemoglobin 11.9 (*)    HCT 35.0 (*)    All other components within normal limits  ETHANOL  LIPASE, BLOOD  CK  RAPID URINE DRUG SCREEN, HOSP PERFORMED    EKG None  Radiology Ct Head Wo Contrast  Result Date: 02/28/2018 CLINICAL DATA:  Found down today. Fell 2 days ago. History of alcohol abuse, hypertension. EXAM: CT HEAD WITHOUT CONTRAST CT MAXILLOFACIAL WITHOUT CONTRAST CT CERVICAL SPINE WITHOUT CONTRAST TECHNIQUE: Multidetector CT imaging of the head, cervical spine, and maxillofacial structures were performed using the standard protocol without intravenous contrast. Multiplanar CT image reconstructions of the cervical spine and maxillofacial structures were also generated. COMPARISON:  CT HEAD Aug 30, 2014 and CT chest February 28, 2018 FINDINGS: CT HEAD FINDINGS BRAIN: Mild parenchymal brain volume loss, disproportion moderate vermian atrophy. No hydrocephalus. No intraparenchymal hemorrhage, mass effect nor midline shift. No acute large  vascular territory infarcts. No abnormal extra-axial fluid collections. Basal cisterns are patent. VASCULAR: Moderate calcific atherosclerosis carotid siphon. SKULL/SOFT TISSUES: No skull fracture. Small LEFT frontal scalp hematoma. OTHER: None. CT MAXILLOFACIAL FINDINGS OSSEOUS: No acute facial fracture. The mandible is intact, the condyles are located. No destructive bony lesions. Patient is edentulous. ORBITS: Ocular globes and orbital contents are nonacute. Status post bilateral occipital lens implants. SINUSES: Mild LEFT fronto ethmoidal mucosal thickening. LEFT concha bullosa. Intact nasal septum is midline. Mastoid aircells are well aerated. SOFT TISSUES: No significant soft tissue swelling. No subcutaneous  gas or radiopaque foreign bodies. CT CERVICAL SPINE FINDINGS ALIGNMENT: Maintenance of cervical lordosis. No malalignment. SKULL BASE AND VERTEBRAE: Old mild C7, moderate T1 and mild T2 compression fractures. No acute fracture. Osteopenia without destructive bony lesions. Severe C5-6 disc height loss with vacuum disc and endplate spurring compatible with degenerative discs, moderate at C4-5 and C6-7. C1-2 articulation maintained. SOFT TISSUES AND SPINAL CANAL: Nonacute. Mild calcific atherosclerosis carotid bifurcations. DISC LEVELS: Disc protrusion resulting in moderate canal stenosis C3-4, C4-5. Moderate to severe bilateral C4-5 and RIGHT C5-6 neural foraminal narrowing. UPPER CHEST: Calcified pleural plaques and granulomas. OTHER: None. IMPRESSION: CT HEAD: 1. No acute intracranial process. Small LEFT frontal scalp hematoma. 2. Moderate vermian atrophy, otherwise negative non-contrast CT HEAD for age. CT MAXILLOFACIAL: 1. No facial fracture. CT CERVICAL SPINE: 1. No acute fracture or malalignment. 2. Moderate canal stenosis C3-4 and C4-5. Moderate to severe neural foraminal narrowing C4-5 and C5-6. Electronically Signed   By: Awilda Metro M.D.   On: 02/28/2018 14:26   Ct Cervical Spine Wo  Contrast  Result Date: 02/28/2018 CLINICAL DATA:  Found down today. Fell 2 days ago. History of alcohol abuse, hypertension. EXAM: CT HEAD WITHOUT CONTRAST CT MAXILLOFACIAL WITHOUT CONTRAST CT CERVICAL SPINE WITHOUT CONTRAST TECHNIQUE: Multidetector CT imaging of the head, cervical spine, and maxillofacial structures were performed using the standard protocol without intravenous contrast. Multiplanar CT image reconstructions of the cervical spine and maxillofacial structures were also generated. COMPARISON:  CT HEAD Aug 30, 2014 and CT chest February 28, 2018 FINDINGS: CT HEAD FINDINGS BRAIN: Mild parenchymal brain volume loss, disproportion moderate vermian atrophy. No hydrocephalus. No intraparenchymal hemorrhage, mass effect nor midline shift. No acute large vascular territory infarcts. No abnormal extra-axial fluid collections. Basal cisterns are patent. VASCULAR: Moderate calcific atherosclerosis carotid siphon. SKULL/SOFT TISSUES: No skull fracture. Small LEFT frontal scalp hematoma. OTHER: None. CT MAXILLOFACIAL FINDINGS OSSEOUS: No acute facial fracture. The mandible is intact, the condyles are located. No destructive bony lesions. Patient is edentulous. ORBITS: Ocular globes and orbital contents are nonacute. Status post bilateral occipital lens implants. SINUSES: Mild LEFT fronto ethmoidal mucosal thickening. LEFT concha bullosa. Intact nasal septum is midline. Mastoid aircells are well aerated. SOFT TISSUES: No significant soft tissue swelling. No subcutaneous gas or radiopaque foreign bodies. CT CERVICAL SPINE FINDINGS ALIGNMENT: Maintenance of cervical lordosis. No malalignment. SKULL BASE AND VERTEBRAE: Old mild C7, moderate T1 and mild T2 compression fractures. No acute fracture. Osteopenia without destructive bony lesions. Severe C5-6 disc height loss with vacuum disc and endplate spurring compatible with degenerative discs, moderate at C4-5 and C6-7. C1-2 articulation maintained. SOFT TISSUES AND  SPINAL CANAL: Nonacute. Mild calcific atherosclerosis carotid bifurcations. DISC LEVELS: Disc protrusion resulting in moderate canal stenosis C3-4, C4-5. Moderate to severe bilateral C4-5 and RIGHT C5-6 neural foraminal narrowing. UPPER CHEST: Calcified pleural plaques and granulomas. OTHER: None. IMPRESSION: CT HEAD: 1. No acute intracranial process. Small LEFT frontal scalp hematoma. 2. Moderate vermian atrophy, otherwise negative non-contrast CT HEAD for age. CT MAXILLOFACIAL: 1. No facial fracture. CT CERVICAL SPINE: 1. No acute fracture or malalignment. 2. Moderate canal stenosis C3-4 and C4-5. Moderate to severe neural foraminal narrowing C4-5 and C5-6. Electronically Signed   By: Awilda Metro M.D.   On: 02/28/2018 14:26   Ct Maxillofacial Wo Cm  Result Date: 02/28/2018 CLINICAL DATA:  Found down today. Fell 2 days ago. History of alcohol abuse, hypertension. EXAM: CT HEAD WITHOUT CONTRAST CT MAXILLOFACIAL WITHOUT CONTRAST CT CERVICAL SPINE WITHOUT CONTRAST TECHNIQUE: Multidetector CT imaging  of the head, cervical spine, and maxillofacial structures were performed using the standard protocol without intravenous contrast. Multiplanar CT image reconstructions of the cervical spine and maxillofacial structures were also generated. COMPARISON:  CT HEAD Aug 30, 2014 and CT chest February 28, 2018 FINDINGS: CT HEAD FINDINGS BRAIN: Mild parenchymal brain volume loss, disproportion moderate vermian atrophy. No hydrocephalus. No intraparenchymal hemorrhage, mass effect nor midline shift. No acute large vascular territory infarcts. No abnormal extra-axial fluid collections. Basal cisterns are patent. VASCULAR: Moderate calcific atherosclerosis carotid siphon. SKULL/SOFT TISSUES: No skull fracture. Small LEFT frontal scalp hematoma. OTHER: None. CT MAXILLOFACIAL FINDINGS OSSEOUS: No acute facial fracture. The mandible is intact, the condyles are located. No destructive bony lesions. Patient is edentulous. ORBITS:  Ocular globes and orbital contents are nonacute. Status post bilateral occipital lens implants. SINUSES: Mild LEFT fronto ethmoidal mucosal thickening. LEFT concha bullosa. Intact nasal septum is midline. Mastoid aircells are well aerated. SOFT TISSUES: No significant soft tissue swelling. No subcutaneous gas or radiopaque foreign bodies. CT CERVICAL SPINE FINDINGS ALIGNMENT: Maintenance of cervical lordosis. No malalignment. SKULL BASE AND VERTEBRAE: Old mild C7, moderate T1 and mild T2 compression fractures. No acute fracture. Osteopenia without destructive bony lesions. Severe C5-6 disc height loss with vacuum disc and endplate spurring compatible with degenerative discs, moderate at C4-5 and C6-7. C1-2 articulation maintained. SOFT TISSUES AND SPINAL CANAL: Nonacute. Mild calcific atherosclerosis carotid bifurcations. DISC LEVELS: Disc protrusion resulting in moderate canal stenosis C3-4, C4-5. Moderate to severe bilateral C4-5 and RIGHT C5-6 neural foraminal narrowing. UPPER CHEST: Calcified pleural plaques and granulomas. OTHER: None. IMPRESSION: CT HEAD: 1. No acute intracranial process. Small LEFT frontal scalp hematoma. 2. Moderate vermian atrophy, otherwise negative non-contrast CT HEAD for age. CT MAXILLOFACIAL: 1. No facial fracture. CT CERVICAL SPINE: 1. No acute fracture or malalignment. 2. Moderate canal stenosis C3-4 and C4-5. Moderate to severe neural foraminal narrowing C4-5 and C5-6. Electronically Signed   By: Awilda Metro M.D.   On: 02/28/2018 14:26    Procedures Procedures (including critical care time)  Medications Ordered in ED Medications  sodium chloride 0.9 % bolus 1,000 mL (0 mLs Intravenous Stopped 02/28/18 1439)  sodium chloride 0.9 % bolus 1,000 mL (0 mLs Intravenous Stopped 02/28/18 1643)     Initial Impression / Assessment and Plan / ED Course  I have reviewed the triage vital signs and the nursing notes.  Pertinent labs & imaging results that were available during  my care of the patient were reviewed by me and considered in my medical decision making (see chart for details).     5:11 PM After 2 L fluid resuscitation the patient's hyperkalemia has resolved, blood pressure is diminished, heart rate has diminished.  The patient is in no distress, is awake, alert. Patient's wounds have been cleaned, and there are no lesions requiring further repair, and the patient's fall was 2 days ago. Patient has improved clinically, has reassuring labs, vitals, was discharged in stable condition.  Final Clinical Impressions(s) / ED Diagnoses  Fall, initial encounter Hyperkalemia   Gerhard Munch, MD 02/28/18 1712

## 2018-02-28 NOTE — ED Triage Notes (Signed)
Patient reports falling 2 days ago after drinking a fifth of vodka. Found by friends on floor today. Patient has laceration to his upper L scalp and over his L eye. Patient reports being "in and out of consciousness." Patient states he was able to get himself to the bathroom. Has not eaten, drank only water. Patient c/o nausea and dizziness. Patient reports normal color of urine.

## 2018-02-28 NOTE — ED Notes (Signed)
Placed Bacitracin ointment on patient's head and tape Telfa dressing over lac. Pt states he needs to find a ride and will wait in the lobby for someone to pick him up.

## 2018-02-28 NOTE — ED Notes (Signed)
Pt was informed that we need a urine sample. Pt states that he can try to urinate. Pt was given a urinal.

## 2018-02-28 NOTE — Discharge Instructions (Addendum)
As discussed, your evaluation today has been largely reassuring.  But, it is important that you monitor your condition carefully, and do not hesitate to return to the ED if you develop new, or concerning changes in your condition. ? ?Otherwise, please follow-up with your physician for appropriate ongoing care. ? ?

## 2018-04-02 ENCOUNTER — Ambulatory Visit: Payer: Medicare HMO

## 2018-04-30 ENCOUNTER — Ambulatory Visit: Payer: Medicare HMO

## 2018-07-01 ENCOUNTER — Ambulatory Visit: Payer: Medicare HMO

## 2018-07-23 DIAGNOSIS — F17210 Nicotine dependence, cigarettes, uncomplicated: Secondary | ICD-10-CM | POA: Diagnosis not present

## 2018-07-23 DIAGNOSIS — I10 Essential (primary) hypertension: Secondary | ICD-10-CM | POA: Diagnosis not present

## 2018-08-19 DIAGNOSIS — Z1211 Encounter for screening for malignant neoplasm of colon: Secondary | ICD-10-CM | POA: Diagnosis not present

## 2018-08-19 DIAGNOSIS — Z1212 Encounter for screening for malignant neoplasm of rectum: Secondary | ICD-10-CM | POA: Diagnosis not present

## 2018-08-23 DIAGNOSIS — Z0000 Encounter for general adult medical examination without abnormal findings: Secondary | ICD-10-CM | POA: Diagnosis not present

## 2018-11-11 DIAGNOSIS — I10 Essential (primary) hypertension: Secondary | ICD-10-CM | POA: Diagnosis not present

## 2018-11-11 DIAGNOSIS — Z125 Encounter for screening for malignant neoplasm of prostate: Secondary | ICD-10-CM | POA: Diagnosis not present

## 2018-11-11 DIAGNOSIS — D649 Anemia, unspecified: Secondary | ICD-10-CM | POA: Diagnosis not present

## 2018-11-11 DIAGNOSIS — Z6821 Body mass index (BMI) 21.0-21.9, adult: Secondary | ICD-10-CM | POA: Diagnosis not present

## 2018-11-11 DIAGNOSIS — E782 Mixed hyperlipidemia: Secondary | ICD-10-CM | POA: Diagnosis not present

## 2018-11-11 DIAGNOSIS — R7301 Impaired fasting glucose: Secondary | ICD-10-CM | POA: Diagnosis not present

## 2018-11-22 DIAGNOSIS — D649 Anemia, unspecified: Secondary | ICD-10-CM | POA: Diagnosis not present

## 2018-11-22 DIAGNOSIS — E782 Mixed hyperlipidemia: Secondary | ICD-10-CM | POA: Diagnosis not present

## 2018-11-22 DIAGNOSIS — F1019 Alcohol abuse with unspecified alcohol-induced disorder: Secondary | ICD-10-CM | POA: Diagnosis not present

## 2018-11-22 DIAGNOSIS — R5383 Other fatigue: Secondary | ICD-10-CM | POA: Diagnosis not present

## 2018-11-22 DIAGNOSIS — J449 Chronic obstructive pulmonary disease, unspecified: Secondary | ICD-10-CM | POA: Diagnosis not present

## 2018-11-22 DIAGNOSIS — F17218 Nicotine dependence, cigarettes, with other nicotine-induced disorders: Secondary | ICD-10-CM | POA: Diagnosis not present

## 2018-11-22 DIAGNOSIS — I82621 Acute embolism and thrombosis of deep veins of right upper extremity: Secondary | ICD-10-CM | POA: Diagnosis not present

## 2018-11-22 DIAGNOSIS — R634 Abnormal weight loss: Secondary | ICD-10-CM | POA: Diagnosis not present

## 2018-11-22 DIAGNOSIS — M79621 Pain in right upper arm: Secondary | ICD-10-CM | POA: Diagnosis not present

## 2019-02-27 ENCOUNTER — Emergency Department (HOSPITAL_COMMUNITY)
Admission: EM | Admit: 2019-02-27 | Discharge: 2019-02-27 | Disposition: A | Discharge status: Home / Self Care | Payer: Medicare HMO | Attending: Emergency Medicine | Admitting: Emergency Medicine

## 2019-02-27 ENCOUNTER — Other Ambulatory Visit: Payer: Self-pay

## 2019-02-27 ENCOUNTER — Encounter (HOSPITAL_COMMUNITY): Payer: Self-pay | Admitting: Emergency Medicine

## 2019-02-27 DIAGNOSIS — R0902 Hypoxemia: Secondary | ICD-10-CM | POA: Diagnosis not present

## 2019-02-27 DIAGNOSIS — M545 Low back pain: Secondary | ICD-10-CM | POA: Diagnosis present

## 2019-02-27 DIAGNOSIS — E161 Other hypoglycemia: Secondary | ICD-10-CM | POA: Diagnosis not present

## 2019-02-27 DIAGNOSIS — Z5321 Procedure and treatment not carried out due to patient leaving prior to being seen by health care provider: Secondary | ICD-10-CM | POA: Diagnosis not present

## 2019-02-27 DIAGNOSIS — R52 Pain, unspecified: Secondary | ICD-10-CM | POA: Diagnosis not present

## 2019-02-27 DIAGNOSIS — M5489 Other dorsalgia: Secondary | ICD-10-CM | POA: Diagnosis not present

## 2019-02-27 DIAGNOSIS — E162 Hypoglycemia, unspecified: Secondary | ICD-10-CM | POA: Diagnosis not present

## 2019-02-27 NOTE — ED Notes (Signed)
Called for Pt in waiting room. No answer.

## 2019-02-27 NOTE — ED Triage Notes (Signed)
Pt states that he fell and hurt his back he has been falling a lot. He wants to get help with etoh problem .

## 2019-05-20 DIAGNOSIS — R7301 Impaired fasting glucose: Secondary | ICD-10-CM | POA: Diagnosis not present

## 2019-05-20 DIAGNOSIS — E782 Mixed hyperlipidemia: Secondary | ICD-10-CM | POA: Diagnosis not present

## 2019-05-20 DIAGNOSIS — I10 Essential (primary) hypertension: Secondary | ICD-10-CM | POA: Diagnosis not present

## 2019-05-20 DIAGNOSIS — E559 Vitamin D deficiency, unspecified: Secondary | ICD-10-CM | POA: Diagnosis not present

## 2019-05-20 DIAGNOSIS — Z1329 Encounter for screening for other suspected endocrine disorder: Secondary | ICD-10-CM | POA: Diagnosis not present

## 2019-05-20 DIAGNOSIS — D649 Anemia, unspecified: Secondary | ICD-10-CM | POA: Diagnosis not present

## 2019-06-04 DIAGNOSIS — E782 Mixed hyperlipidemia: Secondary | ICD-10-CM | POA: Diagnosis not present

## 2019-06-04 DIAGNOSIS — J60 Coalworker's pneumoconiosis: Secondary | ICD-10-CM | POA: Diagnosis not present

## 2019-06-04 DIAGNOSIS — F17210 Nicotine dependence, cigarettes, uncomplicated: Secondary | ICD-10-CM | POA: Diagnosis not present

## 2019-06-04 DIAGNOSIS — D649 Anemia, unspecified: Secondary | ICD-10-CM | POA: Diagnosis not present

## 2019-06-04 DIAGNOSIS — Z0000 Encounter for general adult medical examination without abnormal findings: Secondary | ICD-10-CM | POA: Diagnosis not present

## 2019-06-04 DIAGNOSIS — F1019 Alcohol abuse with unspecified alcohol-induced disorder: Secondary | ICD-10-CM | POA: Diagnosis not present

## 2019-06-04 DIAGNOSIS — F17218 Nicotine dependence, cigarettes, with other nicotine-induced disorders: Secondary | ICD-10-CM | POA: Diagnosis not present

## 2019-06-04 DIAGNOSIS — I82621 Acute embolism and thrombosis of deep veins of right upper extremity: Secondary | ICD-10-CM | POA: Diagnosis not present

## 2019-06-04 DIAGNOSIS — D519 Vitamin B12 deficiency anemia, unspecified: Secondary | ICD-10-CM | POA: Diagnosis not present

## 2019-06-04 DIAGNOSIS — M79621 Pain in right upper arm: Secondary | ICD-10-CM | POA: Diagnosis not present

## 2019-06-04 DIAGNOSIS — F17228 Nicotine dependence, chewing tobacco, with other nicotine-induced disorders: Secondary | ICD-10-CM | POA: Diagnosis not present

## 2019-06-04 DIAGNOSIS — I10 Essential (primary) hypertension: Secondary | ICD-10-CM | POA: Diagnosis not present

## 2019-06-04 DIAGNOSIS — R634 Abnormal weight loss: Secondary | ICD-10-CM | POA: Diagnosis not present

## 2019-06-04 DIAGNOSIS — J449 Chronic obstructive pulmonary disease, unspecified: Secondary | ICD-10-CM | POA: Diagnosis not present

## 2019-06-04 DIAGNOSIS — R5383 Other fatigue: Secondary | ICD-10-CM | POA: Diagnosis not present

## 2019-06-04 DIAGNOSIS — Z6821 Body mass index (BMI) 21.0-21.9, adult: Secondary | ICD-10-CM | POA: Diagnosis not present

## 2019-06-04 DIAGNOSIS — Z0001 Encounter for general adult medical examination with abnormal findings: Secondary | ICD-10-CM | POA: Diagnosis not present

## 2019-07-02 DIAGNOSIS — E782 Mixed hyperlipidemia: Secondary | ICD-10-CM | POA: Diagnosis not present

## 2019-07-02 DIAGNOSIS — F1019 Alcohol abuse with unspecified alcohol-induced disorder: Secondary | ICD-10-CM | POA: Diagnosis not present

## 2019-07-02 DIAGNOSIS — D649 Anemia, unspecified: Secondary | ICD-10-CM | POA: Diagnosis not present

## 2019-07-02 DIAGNOSIS — F17218 Nicotine dependence, cigarettes, with other nicotine-induced disorders: Secondary | ICD-10-CM | POA: Diagnosis not present

## 2019-07-02 DIAGNOSIS — I82621 Acute embolism and thrombosis of deep veins of right upper extremity: Secondary | ICD-10-CM | POA: Diagnosis not present

## 2019-07-02 DIAGNOSIS — J449 Chronic obstructive pulmonary disease, unspecified: Secondary | ICD-10-CM | POA: Diagnosis not present

## 2019-07-02 DIAGNOSIS — R5383 Other fatigue: Secondary | ICD-10-CM | POA: Diagnosis not present

## 2019-07-15 ENCOUNTER — Institutional Professional Consult (permissible substitution): Payer: Medicare HMO | Admitting: Pulmonary Disease

## 2019-07-16 ENCOUNTER — Institutional Professional Consult (permissible substitution): Payer: Medicare HMO | Admitting: Pulmonary Disease

## 2019-08-06 DIAGNOSIS — I82621 Acute embolism and thrombosis of deep veins of right upper extremity: Secondary | ICD-10-CM | POA: Diagnosis not present

## 2019-08-06 DIAGNOSIS — J449 Chronic obstructive pulmonary disease, unspecified: Secondary | ICD-10-CM | POA: Diagnosis not present

## 2019-08-06 DIAGNOSIS — R5383 Other fatigue: Secondary | ICD-10-CM | POA: Diagnosis not present

## 2019-08-06 DIAGNOSIS — D649 Anemia, unspecified: Secondary | ICD-10-CM | POA: Diagnosis not present

## 2019-08-06 DIAGNOSIS — E782 Mixed hyperlipidemia: Secondary | ICD-10-CM | POA: Diagnosis not present

## 2019-08-06 DIAGNOSIS — F1019 Alcohol abuse with unspecified alcohol-induced disorder: Secondary | ICD-10-CM | POA: Diagnosis not present

## 2019-08-06 DIAGNOSIS — F17218 Nicotine dependence, cigarettes, with other nicotine-induced disorders: Secondary | ICD-10-CM | POA: Diagnosis not present

## 2019-09-26 DIAGNOSIS — R531 Weakness: Secondary | ICD-10-CM | POA: Diagnosis not present

## 2019-09-26 DIAGNOSIS — F10929 Alcohol use, unspecified with intoxication, unspecified: Secondary | ICD-10-CM | POA: Diagnosis not present

## 2020-02-12 DIAGNOSIS — E782 Mixed hyperlipidemia: Secondary | ICD-10-CM | POA: Diagnosis not present

## 2020-02-12 DIAGNOSIS — R5383 Other fatigue: Secondary | ICD-10-CM | POA: Diagnosis not present

## 2020-02-12 DIAGNOSIS — J449 Chronic obstructive pulmonary disease, unspecified: Secondary | ICD-10-CM | POA: Diagnosis not present

## 2020-02-12 DIAGNOSIS — F1019 Alcohol abuse with unspecified alcohol-induced disorder: Secondary | ICD-10-CM | POA: Diagnosis not present

## 2020-02-12 DIAGNOSIS — F17218 Nicotine dependence, cigarettes, with other nicotine-induced disorders: Secondary | ICD-10-CM | POA: Diagnosis not present

## 2020-02-12 DIAGNOSIS — D649 Anemia, unspecified: Secondary | ICD-10-CM | POA: Diagnosis not present

## 2020-02-12 DIAGNOSIS — I82621 Acute embolism and thrombosis of deep veins of right upper extremity: Secondary | ICD-10-CM | POA: Diagnosis not present

## 2020-02-12 DIAGNOSIS — Z720 Tobacco use: Secondary | ICD-10-CM | POA: Diagnosis not present

## 2020-03-02 DIAGNOSIS — J449 Chronic obstructive pulmonary disease, unspecified: Secondary | ICD-10-CM | POA: Diagnosis not present

## 2020-03-02 DIAGNOSIS — D649 Anemia, unspecified: Secondary | ICD-10-CM | POA: Diagnosis not present

## 2020-03-02 DIAGNOSIS — E782 Mixed hyperlipidemia: Secondary | ICD-10-CM | POA: Diagnosis not present

## 2020-03-02 DIAGNOSIS — R5383 Other fatigue: Secondary | ICD-10-CM | POA: Diagnosis not present

## 2020-03-02 DIAGNOSIS — F17218 Nicotine dependence, cigarettes, with other nicotine-induced disorders: Secondary | ICD-10-CM | POA: Diagnosis not present

## 2020-03-02 DIAGNOSIS — I82621 Acute embolism and thrombosis of deep veins of right upper extremity: Secondary | ICD-10-CM | POA: Diagnosis not present

## 2020-03-02 DIAGNOSIS — Z720 Tobacco use: Secondary | ICD-10-CM | POA: Diagnosis not present

## 2020-03-02 DIAGNOSIS — F1019 Alcohol abuse with unspecified alcohol-induced disorder: Secondary | ICD-10-CM | POA: Diagnosis not present

## 2020-04-23 DIAGNOSIS — J449 Chronic obstructive pulmonary disease, unspecified: Secondary | ICD-10-CM | POA: Diagnosis not present

## 2020-04-23 DIAGNOSIS — F1019 Alcohol abuse with unspecified alcohol-induced disorder: Secondary | ICD-10-CM | POA: Diagnosis not present

## 2020-04-23 DIAGNOSIS — I82621 Acute embolism and thrombosis of deep veins of right upper extremity: Secondary | ICD-10-CM | POA: Diagnosis not present

## 2020-04-23 DIAGNOSIS — F17218 Nicotine dependence, cigarettes, with other nicotine-induced disorders: Secondary | ICD-10-CM | POA: Diagnosis not present

## 2020-04-23 DIAGNOSIS — R5383 Other fatigue: Secondary | ICD-10-CM | POA: Diagnosis not present

## 2020-04-23 DIAGNOSIS — E782 Mixed hyperlipidemia: Secondary | ICD-10-CM | POA: Diagnosis not present

## 2020-04-23 DIAGNOSIS — Z720 Tobacco use: Secondary | ICD-10-CM | POA: Diagnosis not present

## 2020-04-23 DIAGNOSIS — D649 Anemia, unspecified: Secondary | ICD-10-CM | POA: Diagnosis not present

## 2020-05-05 DIAGNOSIS — F1019 Alcohol abuse with unspecified alcohol-induced disorder: Secondary | ICD-10-CM | POA: Diagnosis not present

## 2020-05-05 DIAGNOSIS — I10 Essential (primary) hypertension: Secondary | ICD-10-CM | POA: Diagnosis not present

## 2020-05-05 DIAGNOSIS — E782 Mixed hyperlipidemia: Secondary | ICD-10-CM | POA: Diagnosis not present

## 2020-05-05 DIAGNOSIS — D649 Anemia, unspecified: Secondary | ICD-10-CM | POA: Diagnosis not present

## 2020-05-05 DIAGNOSIS — F17218 Nicotine dependence, cigarettes, with other nicotine-induced disorders: Secondary | ICD-10-CM | POA: Diagnosis not present

## 2020-05-05 DIAGNOSIS — J449 Chronic obstructive pulmonary disease, unspecified: Secondary | ICD-10-CM | POA: Diagnosis not present

## 2020-07-21 DIAGNOSIS — F1019 Alcohol abuse with unspecified alcohol-induced disorder: Secondary | ICD-10-CM | POA: Diagnosis not present

## 2020-07-21 DIAGNOSIS — E782 Mixed hyperlipidemia: Secondary | ICD-10-CM | POA: Diagnosis not present

## 2020-07-21 DIAGNOSIS — J449 Chronic obstructive pulmonary disease, unspecified: Secondary | ICD-10-CM | POA: Diagnosis not present

## 2020-07-21 DIAGNOSIS — F17218 Nicotine dependence, cigarettes, with other nicotine-induced disorders: Secondary | ICD-10-CM | POA: Diagnosis not present

## 2020-07-21 DIAGNOSIS — I10 Essential (primary) hypertension: Secondary | ICD-10-CM | POA: Diagnosis not present

## 2020-08-29 ENCOUNTER — Emergency Department (HOSPITAL_COMMUNITY): Payer: Medicare HMO

## 2020-08-29 ENCOUNTER — Encounter (HOSPITAL_COMMUNITY): Payer: Self-pay | Admitting: Emergency Medicine

## 2020-08-29 ENCOUNTER — Emergency Department (HOSPITAL_COMMUNITY)
Admission: EM | Admit: 2020-08-29 | Discharge: 2020-08-30 | Disposition: A | Discharge status: Home / Self Care | Payer: Medicare HMO | Attending: Emergency Medicine | Admitting: Emergency Medicine

## 2020-08-29 ENCOUNTER — Other Ambulatory Visit: Payer: Self-pay

## 2020-08-29 DIAGNOSIS — E039 Hypothyroidism, unspecified: Secondary | ICD-10-CM | POA: Insufficient documentation

## 2020-08-29 DIAGNOSIS — Y907 Blood alcohol level of 200-239 mg/100 ml: Secondary | ICD-10-CM | POA: Insufficient documentation

## 2020-08-29 DIAGNOSIS — F10929 Alcohol use, unspecified with intoxication, unspecified: Secondary | ICD-10-CM | POA: Diagnosis not present

## 2020-08-29 DIAGNOSIS — F10920 Alcohol use, unspecified with intoxication, uncomplicated: Secondary | ICD-10-CM

## 2020-08-29 DIAGNOSIS — M25532 Pain in left wrist: Secondary | ICD-10-CM | POA: Diagnosis not present

## 2020-08-29 DIAGNOSIS — F10129 Alcohol abuse with intoxication, unspecified: Secondary | ICD-10-CM | POA: Diagnosis not present

## 2020-08-29 DIAGNOSIS — R0789 Other chest pain: Secondary | ICD-10-CM

## 2020-08-29 DIAGNOSIS — J449 Chronic obstructive pulmonary disease, unspecified: Secondary | ICD-10-CM | POA: Diagnosis not present

## 2020-08-29 DIAGNOSIS — K760 Fatty (change of) liver, not elsewhere classified: Secondary | ICD-10-CM | POA: Diagnosis not present

## 2020-08-29 DIAGNOSIS — F17210 Nicotine dependence, cigarettes, uncomplicated: Secondary | ICD-10-CM | POA: Insufficient documentation

## 2020-08-29 DIAGNOSIS — R079 Chest pain, unspecified: Secondary | ICD-10-CM | POA: Insufficient documentation

## 2020-08-29 DIAGNOSIS — Z79899 Other long term (current) drug therapy: Secondary | ICD-10-CM | POA: Diagnosis not present

## 2020-08-29 DIAGNOSIS — M79601 Pain in right arm: Secondary | ICD-10-CM | POA: Insufficient documentation

## 2020-08-29 DIAGNOSIS — I10 Essential (primary) hypertension: Secondary | ICD-10-CM | POA: Insufficient documentation

## 2020-08-29 DIAGNOSIS — I700 Atherosclerosis of aorta: Secondary | ICD-10-CM | POA: Diagnosis not present

## 2020-08-29 LAB — BASIC METABOLIC PANEL
Anion gap: 22 — ABNORMAL HIGH (ref 5–15)
BUN: 32 mg/dL — ABNORMAL HIGH (ref 8–23)
CO2: 18 mmol/L — ABNORMAL LOW (ref 22–32)
Calcium: 8.8 mg/dL — ABNORMAL LOW (ref 8.9–10.3)
Chloride: 94 mmol/L — ABNORMAL LOW (ref 98–111)
Creatinine, Ser: 0.94 mg/dL (ref 0.61–1.24)
GFR, Estimated: >60 mL/min (ref >60)
Glucose, Bld: 63 mg/dL — ABNORMAL LOW (ref 70–99)
Potassium: 4.8 mmol/L (ref 3.5–5.1)
Sodium: 134 mmol/L — ABNORMAL LOW (ref 135–145)

## 2020-08-29 LAB — ETHANOL: Alcohol, Ethyl (B): 341 mg/dL (ref <10)

## 2020-08-29 LAB — TROPONIN I (HIGH SENSITIVITY): Troponin I (High Sensitivity): 18 ng/L — ABNORMAL HIGH (ref <18)

## 2020-08-29 MED ORDER — SODIUM CHLORIDE 0.9 % IV BOLUS
1000.0000 mL | Freq: Once | INTRAVENOUS | Status: AC
  Administered 2020-08-29: 1000 mL via INTRAVENOUS

## 2020-08-29 NOTE — ED Notes (Signed)
Date and time results received: 08/29/20 @ 2348 (use smartphrase ".now" to insert current time)  Test: ETOH Critical Value:341 Name of Provider Notified:Dr Delo Orders Received? None Or Actions Taken?: None

## 2020-08-29 NOTE — ED Triage Notes (Addendum)
Pt brought in by [facility] (after multiple calls and then refusing care) for feelings that he "is dying". When asked why he feels like he is dying, pt states "because my chest hurts and it hurts when I take a breath in my right lung". Per EMS, pt is a heavy daily liquor drinker.

## 2020-08-29 NOTE — ED Provider Notes (Signed)
[facility] EMERGENCY DEPARTMENT Provider Note   CSN: [id number] Arrival date & time: 08/29/20  2152     History No chief complaint on file.   Larry [name] is a 70 y.o. male.  HPI   Patient is presenting via [facility] because he "feels like he is dying". Patient says this started one week ago. He has pain in the right side of his chest when he breathes or hiccups. It is not constant, does not radiate. Started acutely. No trauma. He also complains that he broke he his left wrist and right arm weeks ago. He states he did not get seek treatment, he just knows that his "arms are busted up". He says he is "drunk as a skunk". When asked how much he had to drink, he says "a lot". He denies any abdominal pain, SOB, nausea.   Past Medical History:  Diagnosis Date  . Alcohol abuse   . Arthritis   . 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 Hypertension   . Shingles   . Shortness of breath dyspnea    SOB with exertion  . Thyroid disease    hypothyroidism    Patient Active Problem List   Diagnosis Date Noted  . Proximal humerus fracture 09/24/2014    Past Surgical History:  Procedure Laterality Date  . CATARACT EXTRACTION W/ INTRAOCULAR LENS  IMPLANT, BILATERAL    . KNEE SURGERY    . ORIF HUMERUS FRACTURE Right 09/24/2014   Procedure: OPEN REDUCTION INTERNAL FIXATION (ORIF) PROXIMAL HUMERUS FRACTURE;  Surgeon: Jones Broom, MD;  Location: [facility] OR;  Service: Orthopedics;  Laterality: Right;  . TONSILLECTOMY         Family History  Problem Relation Age of Onset  . Thyroid disease Mother   . Diabetes Other     Social History   Tobacco Use  . Smoking status: Current Every Day Smoker    Packs/day: 1.00    Types: Cigarettes  . Smokeless tobacco: Never Used  Vaping Use  . Vaping Use: Never used  Substance Use Topics  . Alcohol use: Yes    Comment: drinks 1/5 daily  . Drug use: No    Home Medications Prior to Admission medications   Medication  Sig Start Date End Date Taking? Authorizing Provider  lisinopril-hydrochlorothiazide (PRINZIDE,ZESTORETIC) 10-12.5 MG tablet Take by mouth daily.  11/27/17   [provider]    Allergies    Patient has no known allergies.  Review of Systems   Review of Systems All systems reviewed and are negative except as documented in history of present illness above.  Physical Exam Updated Vital Signs Ht 6\' 2"  (1.88 m)   Wt 62 kg   BMI 17.55 kg/m   Physical Exam Vitals and nursing note reviewed. Exam conducted with a chaperone present.  Constitutional:      General: He is not in acute distress.    Appearance: Normal appearance.     Comments: Patient is slurring words, but responsive.   HENT:     Head: Normocephalic and atraumatic.  Eyes:     General: No scleral icterus.       Right eye: No discharge.        Left eye: No discharge.     Extraocular Movements: Extraocular movements intact.     Pupils: Pupils are equal, round, and reactive to light.  Cardiovascular:     Rate and Rhythm: Normal rate and regular rhythm.     Pulses: Normal  pulses.     Heart sounds: Normal heart sounds. No murmur heard. No friction rub. No gallop.   Pulmonary:     Effort: Pulmonary effort is normal. No respiratory distress.     Breath sounds: Normal breath sounds.  Abdominal:     General: Abdomen is flat. Bowel sounds are normal. There is no distension.     Palpations: Abdomen is soft.     Tenderness: There is no abdominal tenderness.  Musculoskeletal:        General: Tenderness present. No swelling, deformity or signs of injury. Normal range of motion.     Comments: No obvious deformity to upper extremity. Pulses 2+. Able to move passively. Patient states he is tender, but does not respond negatively when limbs are manipulated. No crepitus.   Skin:    General: Skin is warm and dry.     Coloration: Skin is not jaundiced.  Neurological:     Mental Status: He is alert. Mental status is at baseline.      Coordination: Coordination normal.     ED Results / Procedures / Treatments   Labs (all labs ordered are listed, but only abnormal results are displayed) Labs Reviewed - No data to display  EKG None  Radiology No results found.  Procedures Procedures   Medications Ordered in ED Medications - No data to display  ED Course  I have reviewed the triage vital signs and the nursing notes.  Pertinent labs & imaging results that were available during my care of the patient were reviewed by me and considered in my medical decision making (see chart for details).    MDM Rules/Calculators/A&P                          Patient is a 70 year old presenting with multiple complaints: body aches, feeling like he dying, right sided chest pain on inspiration x 1 week, left wrist pain and right arm pain.   Nontoxic appearing. Vitals are stable. PE limited but reassuring. Patient is obviously inebriated but oriented to person, place, time, and year.   10:31 PM - ordered ethanol level and IV fluids for now. Will reassess when patient has time to sober up more. Will start chest pain work up given new onset of right sided chest pain. Low suspicion for MI.    Discussed patient with Dr. Judd Lien, who will be taking over his care. Further plan pending.    Final Clinical Impression(s) / ED Diagnoses Final diagnoses:  None    Rx / DC Orders ED Discharge Orders    None       Theron Arista, PA-C 08/29/20 2304    Geoffery Lyons, MD 08/30/20 [phone]

## 2020-08-30 ENCOUNTER — Emergency Department (HOSPITAL_COMMUNITY): Payer: Medicare HMO

## 2020-08-30 DIAGNOSIS — K760 Fatty (change of) liver, not elsewhere classified: Secondary | ICD-10-CM | POA: Diagnosis not present

## 2020-08-30 DIAGNOSIS — I700 Atherosclerosis of aorta: Secondary | ICD-10-CM | POA: Diagnosis not present

## 2020-08-30 LAB — CBC
HCT: 44.1 % (ref 39.0–52.0)
Hemoglobin: 15.1 g/dL (ref 13.0–17.0)
MCH: 33.6 pg (ref 26.0–34.0)
MCHC: 34.2 g/dL (ref 30.0–36.0)
MCV: 98 fL (ref 80.0–100.0)
Platelets: 152 10*3/uL (ref 150–400)
RBC: 4.5 MIL/uL (ref 4.22–5.81)
RDW: 13.6 % (ref 11.5–15.5)
WBC: 8.9 10*3/uL (ref 4.0–10.5)
nRBC: 0 % (ref 0.0–0.2)

## 2020-08-30 LAB — TROPONIN I (HIGH SENSITIVITY): Troponin I (High Sensitivity): 17 ng/L (ref <18)

## 2020-08-30 MED ORDER — IOHEXOL 350 MG/ML SOLN
100.0000 mL | Freq: Once | INTRAVENOUS | Status: AC | PRN
  Administered 2020-08-30: 100 mL via INTRAVENOUS

## 2020-08-30 MED ORDER — ONDANSETRON HCL 4 MG/2ML IJ SOLN
4.0000 mg | Freq: Once | INTRAMUSCULAR | Status: AC
  Administered 2020-08-30: 4 mg via INTRAVENOUS
  Filled 2020-08-30: qty 2

## 2020-08-30 NOTE — ED Notes (Signed)
Pt will not stay in bed, will not keep monitoring equipment in place despite education and encouragement. Removing equipment as necessary to maintain pt safety from falls.

## 2020-08-30 NOTE — ED Notes (Signed)
Pt spouse called, updated via telephone, call transferred to patient in room. VS updated and stable. Pt continues to slide to end of bed despite education and repositioning to head of bed. Pt denies further needs at this time. Side rails up x 2, bed locked and low, call bell within reach. Will continue to monitor.

## 2020-08-30 NOTE — ED Notes (Signed)
Pt drinking water out of sink and urinating in trash can, returned to bed, cardiac leads replaced, water provided in cups at bedside. Educated pt on use of call bell for resources. Yelled at [employer] and apologized. Call bell in reach. Will continue to monitor.

## 2020-08-30 NOTE — ED Notes (Signed)
Patient transported to CT 

## 2020-08-30 NOTE — Discharge Instructions (Addendum)
Take ibuprofen 600 mg every 6 hours as needed for pain.  Follow-up with your primary doctor if symptoms are not improving in the next few days.  Follow-up with your primary doctor to discuss both alcohol and smoking cessation techniques.

## 2020-08-30 NOTE — ED Notes (Signed)
Attempted to call spouse Skylan Lara at CB# [phone], twice with no answer. No option to leave voicemail.

## 2020-08-30 NOTE — ED Notes (Signed)
Pt explained he is discharged from the ER,  I have explained to the pt that he needs to find a ride home.  Pt states he is trying, he will call Mark.  Awaiting ride news at this time.

## 2020-08-30 NOTE — ED Notes (Signed)
Dialed the number for the patient a ride home.

## 2020-08-30 NOTE — ED Notes (Signed)
Pt changed into paper scrubs, bed contains only 1 one white sheet. Bath provided in room with Julia,NT. Pt belongings double bagged, hospital linens double bagged. Pt derogatory and rude to staff. Bed bug was found on pt prompting change of linens and personal items. Bed bug was placed into specimen cup and in room. Pt drank water out of specimen cup from tap when told to wait on drinking. CT called and notified pt is ready for scan.

## 2020-08-30 NOTE — ED Notes (Signed)
ED Provider at bedside. 

## 2020-08-30 NOTE — ED Notes (Signed)
Pt unable to sign.  Pt to waiting room with a blanket to wait on ride.

## 2020-08-30 NOTE — ED Notes (Signed)
No answer from spouses number

## 2020-08-30 NOTE — ED Notes (Signed)
Attempted to call spouse x2 at CB# [phone] with no answer and no option to leave a voicemail

## 2020-08-30 NOTE — ED Notes (Signed)
[employer] called via secretary to do wellness check on listed address from demographics.

## 2020-08-30 NOTE — ED Notes (Signed)
Asked patient to confirm spouse's number, he reports "I don't know it". Asked him if he had someone else to call he repeats "i've got nobody, i'm a cripple". Pt noted ambulating independently in room despite reporting he cannot walk when asked. Pt has been waiting in room post discharge for transportation

## 2020-08-30 NOTE — ED Notes (Addendum)
Pt heart rate at 148, on edge of bed vomiting. HR rose to 120 with pt waking up and sliding around in bed. EDP made aware. Orders placed for 4mg  zofran IV.

## 2021-01-25 DIAGNOSIS — R0689 Other abnormalities of breathing: Secondary | ICD-10-CM | POA: Diagnosis not present

## 2021-01-25 DIAGNOSIS — R531 Weakness: Secondary | ICD-10-CM | POA: Diagnosis not present

## 2021-01-31 DIAGNOSIS — R531 Weakness: Secondary | ICD-10-CM | POA: Diagnosis not present

## 2021-01-31 DIAGNOSIS — E039 Hypothyroidism, unspecified: Secondary | ICD-10-CM | POA: Diagnosis not present

## 2021-01-31 DIAGNOSIS — F10939 Alcohol use, unspecified with withdrawal, unspecified: Secondary | ICD-10-CM | POA: Diagnosis not present

## 2021-01-31 DIAGNOSIS — J449 Chronic obstructive pulmonary disease, unspecified: Secondary | ICD-10-CM | POA: Diagnosis not present

## 2021-01-31 DIAGNOSIS — I10 Essential (primary) hypertension: Secondary | ICD-10-CM | POA: Diagnosis not present

## 2021-01-31 DIAGNOSIS — Z79899 Other long term (current) drug therapy: Secondary | ICD-10-CM | POA: Diagnosis not present

## 2021-01-31 DIAGNOSIS — F1090 Alcohol use, unspecified, uncomplicated: Secondary | ICD-10-CM | POA: Diagnosis not present

## 2021-02-06 ENCOUNTER — Other Ambulatory Visit: Payer: Self-pay

## 2021-02-06 ENCOUNTER — Encounter (HOSPITAL_COMMUNITY): Payer: Self-pay | Admitting: Emergency Medicine

## 2021-02-06 ENCOUNTER — Inpatient Hospital Stay (HOSPITAL_COMMUNITY)
Admission: EM | Admit: 2021-02-06 | Discharge: 2021-02-14 | DRG: 897 | Disposition: A | Discharge status: Skilled Nursing Facility | Payer: Medicare HMO | Attending: Internal Medicine | Admitting: Internal Medicine

## 2021-02-06 DIAGNOSIS — R296 Repeated falls: Secondary | ICD-10-CM | POA: Diagnosis not present

## 2021-02-06 DIAGNOSIS — W19XXXA Unspecified fall, initial encounter: Secondary | ICD-10-CM

## 2021-02-06 DIAGNOSIS — Z20822 Contact with and (suspected) exposure to covid-19: Secondary | ICD-10-CM | POA: Diagnosis present

## 2021-02-06 DIAGNOSIS — F17210 Nicotine dependence, cigarettes, uncomplicated: Secondary | ICD-10-CM | POA: Diagnosis not present

## 2021-02-06 DIAGNOSIS — F1090 Alcohol use, unspecified, uncomplicated: Secondary | ICD-10-CM | POA: Diagnosis not present

## 2021-02-06 DIAGNOSIS — Y908 Blood alcohol level of 240 mg/100 ml or more: Secondary | ICD-10-CM | POA: Diagnosis present

## 2021-02-06 DIAGNOSIS — M6281 Muscle weakness (generalized): Secondary | ICD-10-CM | POA: Diagnosis not present

## 2021-02-06 DIAGNOSIS — R4182 Altered mental status, unspecified: Secondary | ICD-10-CM | POA: Diagnosis present

## 2021-02-06 DIAGNOSIS — Z23 Encounter for immunization: Secondary | ICD-10-CM | POA: Diagnosis not present

## 2021-02-06 DIAGNOSIS — E039 Hypothyroidism, unspecified: Secondary | ICD-10-CM | POA: Diagnosis present

## 2021-02-06 DIAGNOSIS — J449 Chronic obstructive pulmonary disease, unspecified: Secondary | ICD-10-CM | POA: Diagnosis present

## 2021-02-06 DIAGNOSIS — F10231 Alcohol dependence with withdrawal delirium: Principal | ICD-10-CM | POA: Diagnosis present

## 2021-02-06 DIAGNOSIS — F10920 Alcohol use, unspecified with intoxication, uncomplicated: Secondary | ICD-10-CM

## 2021-02-06 DIAGNOSIS — E876 Hypokalemia: Secondary | ICD-10-CM | POA: Diagnosis not present

## 2021-02-06 DIAGNOSIS — F10939 Alcohol use, unspecified with withdrawal, unspecified: Secondary | ICD-10-CM | POA: Diagnosis not present

## 2021-02-06 DIAGNOSIS — Z79899 Other long term (current) drug therapy: Secondary | ICD-10-CM | POA: Diagnosis not present

## 2021-02-06 DIAGNOSIS — F10229 Alcohol dependence with intoxication, unspecified: Secondary | ICD-10-CM | POA: Diagnosis present

## 2021-02-06 DIAGNOSIS — R5381 Other malaise: Secondary | ICD-10-CM | POA: Diagnosis not present

## 2021-02-06 DIAGNOSIS — R531 Weakness: Secondary | ICD-10-CM | POA: Diagnosis not present

## 2021-02-06 DIAGNOSIS — I10 Essential (primary) hypertension: Secondary | ICD-10-CM | POA: Diagnosis not present

## 2021-02-06 DIAGNOSIS — Z833 Family history of diabetes mellitus: Secondary | ICD-10-CM

## 2021-02-06 DIAGNOSIS — F10239 Alcohol dependence with withdrawal, unspecified: Secondary | ICD-10-CM | POA: Diagnosis not present

## 2021-02-06 DIAGNOSIS — F10129 Alcohol abuse with intoxication, unspecified: Secondary | ICD-10-CM | POA: Diagnosis not present

## 2021-02-06 DIAGNOSIS — F10931 Alcohol use, unspecified with withdrawal delirium: Secondary | ICD-10-CM | POA: Diagnosis not present

## 2021-02-06 DIAGNOSIS — R1312 Dysphagia, oropharyngeal phase: Secondary | ICD-10-CM | POA: Diagnosis not present

## 2021-02-06 DIAGNOSIS — Z7401 Bed confinement status: Secondary | ICD-10-CM | POA: Diagnosis not present

## 2021-02-06 DIAGNOSIS — M6259 Muscle wasting and atrophy, not elsewhere classified, multiple sites: Secondary | ICD-10-CM | POA: Diagnosis not present

## 2021-02-06 DIAGNOSIS — R2689 Other abnormalities of gait and mobility: Secondary | ICD-10-CM | POA: Diagnosis not present

## 2021-02-06 DIAGNOSIS — R0902 Hypoxemia: Secondary | ICD-10-CM | POA: Diagnosis not present

## 2021-02-06 LAB — CBC WITH DIFFERENTIAL/PLATELET
Abs Immature Granulocytes: 0.02 10*3/uL (ref 0.00–0.07)
Basophils Absolute: 0.1 10*3/uL (ref 0.0–0.1)
Basophils Relative: 1 %
Eosinophils Absolute: 0 10*3/uL (ref 0.0–0.5)
Eosinophils Relative: 1 %
HCT: 34 % — ABNORMAL LOW (ref 39.0–52.0)
Hemoglobin: 11.7 g/dL — ABNORMAL LOW (ref 13.0–17.0)
Immature Granulocytes: 0 %
Lymphocytes Relative: 39 %
Lymphs Abs: 2 10*3/uL (ref 0.7–4.0)
MCH: 35.9 pg — ABNORMAL HIGH (ref 26.0–34.0)
MCHC: 34.4 g/dL (ref 30.0–36.0)
MCV: 104.3 fL — ABNORMAL HIGH (ref 80.0–100.0)
Monocytes Absolute: 0.5 10*3/uL (ref 0.1–1.0)
Monocytes Relative: 11 %
Neutro Abs: 2.5 10*3/uL (ref 1.7–7.7)
Neutrophils Relative %: 48 %
Platelets: 303 10*3/uL (ref 150–400)
RBC: 3.26 MIL/uL — ABNORMAL LOW (ref 4.22–5.81)
RDW: 15.5 % (ref 11.5–15.5)
WBC: 5.1 10*3/uL (ref 4.0–10.5)
nRBC: 0 % (ref 0.0–0.2)

## 2021-02-06 LAB — RESP PANEL BY RT-PCR (FLU A&B, COVID) ARPGX2
Influenza A by PCR: NEGATIVE
Influenza B by PCR: NEGATIVE
SARS Coronavirus 2 by RT PCR: NEGATIVE

## 2021-02-06 LAB — CREATININE, SERUM
Creatinine, Ser: 0.68 mg/dL (ref 0.61–1.24)
GFR, Estimated: >60 mL/min (ref >60)

## 2021-02-06 LAB — COMPREHENSIVE METABOLIC PANEL
ALT: 17 U/L (ref 0–44)
AST: 41 U/L (ref 15–41)
Albumin: 2.9 g/dL — ABNORMAL LOW (ref 3.5–5.0)
Alkaline Phosphatase: 94 U/L (ref 38–126)
Anion gap: 9 (ref 5–15)
BUN: 9 mg/dL (ref 8–23)
CO2: 27 mmol/L (ref 22–32)
Calcium: 8.1 mg/dL — ABNORMAL LOW (ref 8.9–10.3)
Chloride: 104 mmol/L (ref 98–111)
Creatinine, Ser: 0.68 mg/dL (ref 0.61–1.24)
GFR, Estimated: >60 mL/min (ref >60)
Glucose, Bld: 84 mg/dL (ref 70–99)
Potassium: 3.9 mmol/L (ref 3.5–5.1)
Sodium: 140 mmol/L (ref 135–145)
Total Bilirubin: 0.3 mg/dL (ref 0.3–1.2)
Total Protein: 6.2 g/dL — ABNORMAL LOW (ref 6.5–8.1)

## 2021-02-06 LAB — CBC
HCT: 32 % — ABNORMAL LOW (ref 39.0–52.0)
Hemoglobin: 11 g/dL — ABNORMAL LOW (ref 13.0–17.0)
MCH: 35.6 pg — ABNORMAL HIGH (ref 26.0–34.0)
MCHC: 34.4 g/dL (ref 30.0–36.0)
MCV: 103.6 fL — ABNORMAL HIGH (ref 80.0–100.0)
Platelets: 275 10*3/uL (ref 150–400)
RBC: 3.09 MIL/uL — ABNORMAL LOW (ref 4.22–5.81)
RDW: 14.9 % (ref 11.5–15.5)
WBC: 5.6 10*3/uL (ref 4.0–10.5)
nRBC: 0 % (ref 0.0–0.2)

## 2021-02-06 LAB — ETHANOL
Alcohol, Ethyl (B): 260 mg/dL — ABNORMAL HIGH (ref <10)
Alcohol, Ethyl (B): 54 mg/dL — ABNORMAL HIGH (ref <10)

## 2021-02-06 LAB — BRAIN NATRIURETIC PEPTIDE: B Natriuretic Peptide: 174 pg/mL — ABNORMAL HIGH (ref 0.0–100.0)

## 2021-02-06 LAB — HIV ANTIBODY (ROUTINE TESTING W REFLEX): HIV Screen 4th Generation wRfx: NONREACTIVE

## 2021-02-06 LAB — TSH: TSH: 2.653 u[IU]/mL (ref 0.350–4.500)

## 2021-02-06 MED ORDER — THIAMINE HCL 100 MG/ML IJ SOLN
100.0000 mg | Freq: Every day | INTRAMUSCULAR | Status: DC
  Administered 2021-02-06 – 2021-02-09 (×2): 100 mg via INTRAVENOUS
  Filled 2021-02-06 (×2): qty 2

## 2021-02-06 MED ORDER — LISINOPRIL-HYDROCHLOROTHIAZIDE 10-12.5 MG PO TABS: 1.0000 | ORAL_TABLET | Freq: Every day | ORAL | Status: DC

## 2021-02-06 MED ORDER — SODIUM CHLORIDE 0.9 % IV SOLN: INTRAVENOUS | Status: AC

## 2021-02-06 MED ORDER — ONDANSETRON HCL 4 MG/2ML IJ SOLN: 4.0000 mg | Freq: Four times a day (QID) | INTRAMUSCULAR | Status: DC | PRN

## 2021-02-06 MED ORDER — HYDROCHLOROTHIAZIDE 12.5 MG PO CAPS
12.5000 mg | ORAL_CAPSULE | Freq: Every day | ORAL | Status: DC
  Administered 2021-02-06 – 2021-02-14 (×9): 12.5 mg via ORAL
  Filled 2021-02-06 (×12): qty 1

## 2021-02-06 MED ORDER — ACETAMINOPHEN 650 MG RE SUPP: 650.0000 mg | Freq: Four times a day (QID) | RECTAL | Status: DC | PRN

## 2021-02-06 MED ORDER — LORAZEPAM 2 MG/ML IJ SOLN
0.0000 mg | Freq: Four times a day (QID) | INTRAMUSCULAR | Status: AC
  Administered 2021-02-06 – 2021-02-07 (×5): 1 mg via INTRAVENOUS
  Administered 2021-02-07 – 2021-02-08 (×3): 2 mg via INTRAVENOUS
  Filled 2021-02-06 (×9): qty 1

## 2021-02-06 MED ORDER — SODIUM CHLORIDE 0.9 % IV BOLUS
500.0000 mL | Freq: Once | INTRAVENOUS | Status: AC
  Administered 2021-02-06: 500 mL via INTRAVENOUS

## 2021-02-06 MED ORDER — TRAZODONE HCL 50 MG PO TABS
25.0000 mg | ORAL_TABLET | Freq: Every evening | ORAL | Status: DC | PRN
  Administered 2021-02-07 – 2021-02-13 (×3): 25 mg via ORAL
  Filled 2021-02-06 (×3): qty 1

## 2021-02-06 MED ORDER — SODIUM CHLORIDE 0.9 % IV SOLN: 250.0000 mL | INTRAVENOUS | Status: DC | PRN

## 2021-02-06 MED ORDER — LORAZEPAM 1 MG PO TABS: 0.0000 mg | ORAL_TABLET | Freq: Four times a day (QID) | ORAL | Status: AC

## 2021-02-06 MED ORDER — THIAMINE HCL 100 MG PO TABS
100.0000 mg | ORAL_TABLET | Freq: Every day | ORAL | Status: DC
  Administered 2021-02-07 – 2021-02-14 (×7): 100 mg via ORAL
  Filled 2021-02-06 (×7): qty 1

## 2021-02-06 MED ORDER — SODIUM CHLORIDE 0.9% FLUSH
3.0000 mL | Freq: Two times a day (BID) | INTRAVENOUS | Status: DC
  Administered 2021-02-07 – 2021-02-14 (×8): 3 mL via INTRAVENOUS

## 2021-02-06 MED ORDER — ACETAMINOPHEN 325 MG PO TABS: 650.0000 mg | ORAL_TABLET | Freq: Four times a day (QID) | ORAL | Status: DC | PRN

## 2021-02-06 MED ORDER — BACITRACIN-POLYMYXIN B 500-10000 UNIT/GM OP OINT
TOPICAL_OINTMENT | Freq: Three times a day (TID) | OPHTHALMIC | Status: DC
  Administered 2021-02-10 – 2021-02-11 (×2): 1 via OPHTHALMIC
  Filled 2021-02-06 (×2): qty 3.5

## 2021-02-06 MED ORDER — SODIUM CHLORIDE 0.9% FLUSH: 3.0000 mL | INTRAVENOUS | Status: DC | PRN

## 2021-02-06 MED ORDER — LISINOPRIL 10 MG PO TABS
10.0000 mg | ORAL_TABLET | Freq: Every day | ORAL | Status: DC
  Administered 2021-02-06 – 2021-02-14 (×9): 10 mg via ORAL
  Filled 2021-02-06 (×9): qty 1

## 2021-02-06 MED ORDER — LORAZEPAM 1 MG PO TABS: 0.0000 mg | ORAL_TABLET | Freq: Two times a day (BID) | ORAL | Status: AC

## 2021-02-06 MED ORDER — OXYCODONE HCL 5 MG PO TABS
5.0000 mg | ORAL_TABLET | ORAL | Status: DC | PRN
Start: 2021-02-06 — End: 2021-02-14
  Administered 2021-02-08: 5 mg via ORAL
  Filled 2021-02-06: qty 1

## 2021-02-06 MED ORDER — ONDANSETRON HCL 4 MG PO TABS: 4.0000 mg | ORAL_TABLET | Freq: Four times a day (QID) | ORAL | Status: DC | PRN

## 2021-02-06 MED ORDER — SODIUM CHLORIDE 0.9 % IV SOLN: INTRAVENOUS | Status: DC

## 2021-02-06 MED ORDER — LORAZEPAM 2 MG/ML IJ SOLN
0.0000 mg | Freq: Two times a day (BID) | INTRAMUSCULAR | Status: AC
  Administered 2021-02-09: 1 mg via INTRAVENOUS
  Administered 2021-02-10: 4 mg via INTRAVENOUS
  Filled 2021-02-06 (×2): qty 1
  Filled 2021-02-06: qty 2

## 2021-02-06 MED ORDER — HEPARIN SODIUM (PORCINE) 5000 UNIT/ML IJ SOLN
5000.0000 [IU] | Freq: Three times a day (TID) | INTRAMUSCULAR | Status: DC
  Administered 2021-02-06 – 2021-02-13 (×22): 5000 [IU] via SUBCUTANEOUS
  Filled 2021-02-06 (×24): qty 1

## 2021-02-06 MED ORDER — HYDRALAZINE HCL 20 MG/ML IJ SOLN: 10.0000 mg | INTRAMUSCULAR | Status: DC | PRN

## 2021-02-06 MED ORDER — IPRATROPIUM BROMIDE 0.02 % IN SOLN
0.5000 mg | Freq: Four times a day (QID) | RESPIRATORY_TRACT | Status: DC | PRN
  Administered 2021-02-12: 0.5 mg via RESPIRATORY_TRACT
  Filled 2021-02-06: qty 2.5

## 2021-02-06 MED ORDER — SENNOSIDES-DOCUSATE SODIUM 8.6-50 MG PO TABS: 1.0000 | ORAL_TABLET | Freq: Every evening | ORAL | Status: DC | PRN

## 2021-02-06 MED ORDER — HYDROMORPHONE HCL 1 MG/ML IJ SOLN: 0.5000 mg | INTRAMUSCULAR | Status: DC | PRN

## 2021-02-06 MED ORDER — BISACODYL 5 MG PO TBEC: 5.0000 mg | DELAYED_RELEASE_TABLET | Freq: Every day | ORAL | Status: DC | PRN

## 2021-02-06 MED ORDER — LORAZEPAM 2 MG/ML IJ SOLN
1.0000 mg | Freq: Once | INTRAMUSCULAR | Status: AC
  Administered 2021-02-06: 1 mg via INTRAVENOUS
  Filled 2021-02-06 (×2): qty 1

## 2021-02-06 MED ORDER — LEVALBUTEROL HCL 0.63 MG/3ML IN NEBU
0.6300 mg | INHALATION_SOLUTION | Freq: Four times a day (QID) | RESPIRATORY_TRACT | Status: DC | PRN
  Administered 2021-02-12: 0.63 mg via RESPIRATORY_TRACT
  Filled 2021-02-06: qty 3

## 2021-02-06 MED ORDER — SODIUM CHLORIDE 0.9% FLUSH
3.0000 mL | Freq: Two times a day (BID) | INTRAVENOUS | Status: DC
  Administered 2021-02-07 – 2021-02-14 (×11): 3 mL via INTRAVENOUS

## 2021-02-06 NOTE — H&P (Signed)
History and Physical   Patient: Larry [name]                            PCP: Benita Stabile, MD                    DOB: 1950/11/26            DOA: 02/06/2021 [id number]             DOS: 02/06/2021, 1:17 PM  Benita Stabile, MD  Patient coming from:   HOME  I have personally reviewed patient's medical records, in electronic medical records, including:  [facility] link, and care everywhere.    Chief Complaint:   Chief Complaint  Patient presents with   Fall    History of present illness:    Larry [name] is a 70 y.o. male with medical history significant of hypertension, chronic alcohol use, hypothyroidism, COPD, presented to the ED intoxicated... Subsequently upon evaluation in ED patient started having tremors, falls....  Procollagen initiated, requested admission for inpatient detox. Admitted with drinking half quart day,... Likely more, for many years, last drink last night  Patient Denies having: Fever, Chills, Cough, SOB, Chest Pain, Abd pain, N/V/D, headache, dizziness, lightheadedness,  Dysuria, Joint pain, rash, open wounds  ED Course:   Vitals:   02/06/21 1130 02/06/21 1300  BP: (!) 148/94 128/69  Pulse: 86 (!) 47  Resp: 16 17  Temp:    SpO2: 97% 100%   Abnormal labs; hemoglobin 11.7, sodium 140, potassium 3.9, within normal notes exception of calcium 8.1, albumin 2.9, LFTs otherwise within normal limits Alcohol level 360, 54 SARS-CoV-2 negative    Review of Systems: As per HPI, otherwise 10 point review of systems were negative.   ----------------------------------------------------------------------------------------------------------------------  No Known Allergies  Home MEDs:  Prior to Admission medications   Medication Sig Start Date End Date Taking? Authorizing Provider  lisinopril-hydrochlorothiazide (PRINZIDE,ZESTORETIC) 10-12.5 MG tablet Take by mouth daily.  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 He has been smoking an average of 1 pack per day. He has never used smokeless tobacco. He reports current alcohol use. He reports that he does not use drugs.   Family History  Problem Relation Age of Onset   Thyroid disease Mother    Diabetes Other     Physical Exam:   Vitals:   02/06/21 0517 02/06/21 1112 02/06/21 1130 02/06/21 1300  BP: (!) 142/83 (!) 150/70 (!) 148/94 128/69  Pulse: 84 79 86 (!)  47  Resp: 16  16 17   Temp: 98.3 F (36.8 C)     TempSrc: Oral     SpO2: 99%  97% 100%  Weight:      Height:       Constitutional: NAD, calm, comfortable Eyes: PERRL, lids and conjunctivae normal ENMT: Mucous membranes are moist. Posterior pharynx clear of any exudate or lesions.Normal dentition.  Neck: normal, supple, no masses, no thyromegaly Respiratory: clear to auscultation bilaterally, no wheezing, no crackles. Normal respiratory effort. No accessory muscle use.  Cardiovascular: Regular rate and rhythm, no murmurs / rubs / gallops. No extremity edema. 2+ pedal pulses. No carotid bruits.  Abdomen: no  tenderness, no masses palpated. No hepatosplenomegaly. Bowel sounds positive.  Musculoskeletal: no clubbing / cyanosis. No joint deformity upper and lower extremities. Good ROM, no contractures. Normal muscle tone.  Neurologic: CN II-XII grossly intact. Sensation intact, DTR normal. Strength 5/5 in all 4.  Psychiatric: Normal judgment and insight. Alert and oriented x 3. Normal mood.  Skin: no rashes, lesions, ulcers. No induration Decubitus/ulcers:  Wounds: per nursing documentation         Labs on admission:    I have personally reviewed following labs and imaging studies  CBC: Recent Labs  Lab 02/06/21 0142  WBC 5.1  NEUTROABS 2.5  HGB 11.7*  HCT 34.0*  MCV 104.3*  PLT 303   Basic Metabolic Panel: Recent Labs  Lab 02/06/21 0142  NA 140  K 3.9  CL 104  CO2 27  GLUCOSE 84  BUN 9  CREATININE 0.68  CALCIUM 8.1*   GFR: Estimated Creatinine Clearance: 75.3 mL/min (by C-G formula based on SCr of 0.68 mg/dL). Liver Function Tests: Recent Labs  Lab 02/06/21 0142  AST 41  ALT 17  ALKPHOS 94  BILITOT 0.3  PROT 6.2*  ALBUMIN 2.9*   No results for input(s): LIPASE, AMYLASE in the last 168 hours. No results for input(s): AMMONIA in the last 168 hours. Coagulation Profile: No results for input(s): INR, PROTIME in the last 168 hours. Cardiac Enzymes: No results for input(s): CKTOTAL, CKMB, CKMBINDEX, TROPONINI in the last 168 hours. BNP (last 3 results) No results for input(s): PROBNP in the last 8760 hours. HbA1C: No results for input(s): HGBA1C in the last 72 hours. CBG: No results for input(s): GLUCAP in the last 168 hours. Lipid Profile: No results for input(s): CHOL, HDL, LDLCALC, TRIG, CHOLHDL, LDLDIRECT in the last 72 hours. Thyroid Function Tests: No results for input(s): TSH, T4TOTAL, FREET4, T3FREE, THYROIDAB in the last 72 hours. Anemia Panel: No results for input(s): VITAMINB12, FOLATE, FERRITIN, TIBC, IRON, RETICCTPCT in the last 72  hours. Urine analysis:    Component Value Date/Time   COLORURINE YELLOW 02/28/2018 1244   APPEARANCEUR HAZY (A) 02/28/2018 1244   LABSPEC 1.021 02/28/2018 1244   PHURINE 5.0 02/28/2018 1244   GLUCOSEU NEGATIVE 02/28/2018 1244   HGBUR NEGATIVE 02/28/2018 1244   BILIRUBINUR NEGATIVE 02/28/2018 1244   KETONESUR 20 (A) 02/28/2018 1244   PROTEINUR NEGATIVE 02/28/2018 1244   NITRITE NEGATIVE 02/28/2018 1244   LEUKOCYTESUR NEGATIVE 02/28/2018 1244     Radiologic Exams on Admission:   No results found.  EKG:   Independently reviewed.  Orders placed or performed during the hospital encounter of 02/06/21   ED EKG   ED EKG   EKG 12-Lead   ---------------------------------------------------------------------------------------------------------------------------------------    Assessment / Plan:   Principal Problem:   Alcohol withdrawal (HCC) Active Problems:   Falls frequently  Principal Problem: Alcohol intoxication/alcohol  withdrawal (HCC) -Upon arrival Alcohol level 360, 54 -Extensive history of alcohol abuse, -Patient will be admitted to MedSurg, CIWA protocol initiated -Patient detoxified, with aggressive IV fluid resuscitation, CIWA protocol,-following replacement   History of alcohol abuse -Patient has been counseled, recommend to abstain from alcohol products Management as above  Status post frequent falls -Likely due to to alcohol intoxication -Once detox, consulting PT OT for evaluation  Hypertension -Currently stable, reviewed home medication will resume accordingly -As needed hydralazine  History of COPD -Currently stable, satting greater 90% room air, not O2 dependent at home   Per record history of hypothyroidism -Currently patient's vitamin D supplements thyroid medication, will check TSH   Cultures:  -none  Antimicrobial: -none   Consults called:   None -------------------------------------------------------------------------------------------------------------------------------------------- DVT prophylaxis: SCD/Compression stockings and Heparin SQ Code Status:   Code Status: Full Code   Admission status: Patient will be admitted as Inpatient, with a greater than 2 midnight length of stay. Level of care: Med-Surg   Family Communication:  none at bedside  (The above findings and plan of care has been discussed with patient in detail, the patient expressed understanding and agreement of above plan)  --------------------------------------------------------------------------------------------------------------------------------------------------  Disposition Plan: >3 days Status is: Inpatient  Remains inpatient appropriate because: Continue needing IV benzodiazepines for significant alcohol withdrawal, hemodynamically stabilization         ----------------------------------------------------------------------------------------------------------------------------------------------------  Time spent: > than  55  Min.   SIGNED: Kendell Bane, MD, FHM. [facility],  Pager (Please use [email or web address] to page to text)  If 7PM-7AM, please contact night-coverage [email or web address],  02/06/2021, 1:17 PM

## 2021-02-06 NOTE — ED Provider Notes (Signed)
[facility] EMERGENCY DEPARTMENT Provider Note   CSN: [id number] Arrival date & time: 02/06/21  0050     History Chief Complaint  Patient presents with   Larry [name] is a 70 y.o. male.  Patient is a 70 year old male presenting with complaints of fall.  From what I am told patient tripped and fell, but was uninjured.  Patient reports feeling short of breath to EMS, however denies this now.  Patient admits to drinking significant quantities of alcohol every day and tells me that he wants help with stopping.  He tells me he has drank his entire life.  He denies any pain.  The history is provided by the patient.      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   Hypertension    Shingles    Shortness of breath dyspnea    SOB with exertion   Thyroid disease    hypothyroidism    Patient Active Problem List   Diagnosis Date Noted   Proximal humerus fracture 09/24/2014    Past Surgical History:  Procedure Laterality Date   CATARACT EXTRACTION W/ INTRAOCULAR LENS  IMPLANT, BILATERAL     KNEE SURGERY     ORIF HUMERUS FRACTURE Right 09/24/2014   Procedure: OPEN REDUCTION INTERNAL FIXATION (ORIF) PROXIMAL HUMERUS FRACTURE;  Surgeon: Jones Broom, MD;  Location: [facility] OR;  Service: Orthopedics;  Laterality: Right;   TONSILLECTOMY         Family History  Problem Relation Age of Onset   Thyroid disease Mother    Diabetes Other     Social History   Tobacco Use   Smoking status: Every Day    Packs/day: 1.00    Types: Cigarettes   Smokeless tobacco: Never  Vaping Use   Vaping Use: Never used  Substance Use Topics   Alcohol use: Yes    Comment: drinks 1/5 daily   Drug use: No    Home Medications Prior to Admission medications   Medication Sig Start Date End Date Taking? Authorizing Provider  lisinopril-hydrochlorothiazide (PRINZIDE,ZESTORETIC) 10-12.5 MG tablet  Take by mouth daily.  11/27/17   [provider]    Allergies    Patient has no known allergies.  Review of Systems   Review of Systems  All other systems reviewed and are negative.  Physical Exam Updated Vital Signs Ht 6\' 2"  (1.88 m)   Wt 62 kg   BMI 17.55 kg/m   Physical Exam Vitals and nursing note reviewed.  Constitutional:      General: He is not in acute distress.    Appearance: He is well-developed. He is not diaphoretic.     Comments: Patient is awake and alert.  He does appear intoxicated with odor of alcohol present.  He is somewhat unkempt in appearance.  HENT:     Head: Normocephalic and atraumatic.  Cardiovascular:     Rate and Rhythm: Normal rate and regular rhythm.     Heart sounds: No murmur heard.   No friction rub.  Pulmonary:     Effort: Pulmonary effort is normal. No respiratory distress.     Breath sounds: Normal breath sounds. No wheezing or rales.  Abdominal:     General: Bowel sounds are normal. There is no distension.     Palpations: Abdomen is soft.     Tenderness: There is no abdominal tenderness.  Musculoskeletal:        General: Normal range of motion.     Cervical back: Normal range of motion and neck supple.  Skin:    General: Skin is warm and dry.  Neurological:     Mental Status: He is alert and oriented to person, place, and time.     Cranial Nerves: No cranial nerve deficit.     Coordination: Coordination normal.    ED Results / Procedures / Treatments   Labs (all labs ordered are listed, but only abnormal results are displayed) Labs Reviewed  COMPREHENSIVE METABOLIC PANEL  ETHANOL  CBC WITH DIFFERENTIAL/PLATELET    EKG None  Radiology No results found.  Procedures Procedures   Medications Ordered in ED Medications - No data to display  ED Course  I have reviewed the triage vital signs and the nursing notes.  Pertinent labs & imaging results that were available during my care of the patient were reviewed  by me and considered in my medical decision making (see chart for details).    MDM Rules/Calculators/A&P  Patient brought by EMS after a fall at home.  He was apparently heavily intoxicated when he fell to the, but was not injured.  He says he wants help with his drinking.  Patient's laboratory studies reveal a blood alcohol of 260, but are otherwise unremarkable.  I see nothing injury wise that requires any imaging.  He will be given the resource guide for alcohol and drug addiction treatment and seems appropriate for discharge.  Final Clinical Impression(s) / ED Diagnoses Final diagnoses:  None    Rx / DC Orders ED Discharge Orders     None        Geoffery Lyons, MD 02/06/21 [phone]

## 2021-02-06 NOTE — ED Triage Notes (Addendum)
Pt fell at home and then called EMS for c/o SOB. Pt admits to drinking a fifth of liquor daily. Per EMS, pt has big hole in floor in front of toilet which was where he was attempting to go. EMS also states house covered with bugs (roaches, etc...).

## 2021-02-06 NOTE — Discharge Instructions (Addendum)
Follow-up with alcohol treatment facilities as provided in this discharge summary.

## 2021-02-06 NOTE — ED Provider Notes (Signed)
Patient stating that he cannot walk.  Nurse tried to walk him.  Patient with tremors all over.  Probably going through alcohol withdrawal.  Which would be expected.  We will go ahead and initiate alcohol withdrawal treatment.  Patient may end up requiring medical admission at this point.  We will give IV fluids patient's labs without significant abnormalities we will check his alcohol level again.  We will start him on CIWA protocol.  Nurse will contact the police department apparently has a 70 year old dog at his residence that is currently locked up that will need assistance.  Patient admits to drinking half a quart a day but based on his alcohol level when he got in here it appears that he may be drinks more than that.  CRITICAL CARE Performed by: Vanetta Mulders Total critical care time: 35 minutes Critical care time was exclusive of separately billable procedures and treating other patients. Critical care was necessary to treat or prevent imminent or life-threatening deterioration. Critical care was time spent personally by me on the following activities: development of treatment plan with patient and/or surrogate as well as nursing, discussions with consultants, evaluation of patient's response to treatment, examination of patient, obtaining history from patient or surrogate, ordering and performing treatments and interventions, ordering and review of laboratory studies, ordering and review of radiographic studies, pulse oximetry and re-evaluation of patient's condition.    Vanetta Mulders, MD 02/06/21 [phone]

## 2021-02-07 DIAGNOSIS — I10 Essential (primary) hypertension: Secondary | ICD-10-CM

## 2021-02-07 DIAGNOSIS — R296 Repeated falls: Secondary | ICD-10-CM | POA: Diagnosis not present

## 2021-02-07 DIAGNOSIS — F10931 Alcohol use, unspecified with withdrawal delirium: Secondary | ICD-10-CM | POA: Diagnosis not present

## 2021-02-07 LAB — BASIC METABOLIC PANEL
Anion gap: 6 (ref 5–15)
BUN: 12 mg/dL (ref 8–23)
CO2: 26 mmol/L (ref 22–32)
Calcium: 8.1 mg/dL — ABNORMAL LOW (ref 8.9–10.3)
Chloride: 104 mmol/L (ref 98–111)
Creatinine, Ser: 0.67 mg/dL (ref 0.61–1.24)
GFR, Estimated: >60 mL/min (ref >60)
Glucose, Bld: 85 mg/dL (ref 70–99)
Potassium: 3.8 mmol/L (ref 3.5–5.1)
Sodium: 136 mmol/L (ref 135–145)

## 2021-02-07 LAB — CBC
HCT: 32.8 % — ABNORMAL LOW (ref 39.0–52.0)
Hemoglobin: 11.1 g/dL — ABNORMAL LOW (ref 13.0–17.0)
MCH: 34.8 pg — ABNORMAL HIGH (ref 26.0–34.0)
MCHC: 33.8 g/dL (ref 30.0–36.0)
MCV: 102.8 fL — ABNORMAL HIGH (ref 80.0–100.0)
Platelets: 266 10*3/uL (ref 150–400)
RBC: 3.19 MIL/uL — ABNORMAL LOW (ref 4.22–5.81)
RDW: 14.5 % (ref 11.5–15.5)
WBC: 5.1 10*3/uL (ref 4.0–10.5)
nRBC: 0 % (ref 0.0–0.2)

## 2021-02-07 LAB — PROTIME-INR
INR: 1 (ref 0.8–1.2)
Prothrombin Time: 12.9 seconds (ref 11.4–15.2)

## 2021-02-07 LAB — GLUCOSE, CAPILLARY: Glucose-Capillary: 77 mg/dL (ref 70–99)

## 2021-02-07 MED ORDER — PNEUMOCOCCAL VAC POLYVALENT 25 MCG/0.5ML IJ INJ
0.5000 mL | INJECTION | INTRAMUSCULAR | Status: AC
  Administered 2021-02-08: 0.5 mL via INTRAMUSCULAR
  Filled 2021-02-07: qty 0.5

## 2021-02-07 MED ORDER — METOPROLOL TARTRATE 25 MG PO TABS
25.0000 mg | ORAL_TABLET | Freq: Two times a day (BID) | ORAL | Status: DC
  Administered 2021-02-07 – 2021-02-14 (×14): 25 mg via ORAL
  Filled 2021-02-07 (×14): qty 1

## 2021-02-07 NOTE — Progress Notes (Signed)
Progress note  Patient: Larry [name]                PCP: Benita Stabile, MD                    DOB: [birth year]            DOA: 02/06/2021 [id number]             DOS: 02/07/2021, 3:25 PM  Benita Stabile, MD  Patient coming from:   HOME  I have personally reviewed patient's medical records, in electronic medical records, including:   link, and care everywhere.    Subjective:   Seen and examined this morning, stable mild tremors, denies any chest pain Currently on the CIWA protocol Ativan Medically stable with exception of hypertension satting 96% on room air  History of present illness:    BENETT SWOYER is a 70 y.o. male with medical history significant of hypertension, chronic alcohol use, hypothyroidism, COPD, presented to the ED intoxicated... Subsequently upon evaluation in ED patient started having tremors, falls....  Procollagen initiated, requested admission for inpatient detox. Admitted with drinking half quart day,... Likely more, for many years, last drink last night  Patient Denies having: Fever, Chills, Cough, SOB, Chest Pain, Abd pain, N/V/D, headache, dizziness, lightheadedness,  Dysuria, Joint pain, rash, open wounds  ED Course:   Vitals:   02/07/21 0822 02/07/21 1249  BP: (!) 158/86 (!) 160/78  Pulse: 76 82  Resp: 17 18  Temp: 98.1 F (36.7 C) 97.7 F (36.5 C)  SpO2: 95% 96%   Abnormal labs; hemoglobin 11.7, sodium 140, potassium 3.9, within normal notes exception of calcium 8.1, albumin 2.9, LFTs otherwise within normal limits Alcohol level 360, 54 SARS-CoV-2 negative  Assessment / Plan:    Principal Problem:   Alcohol withdrawal (HCC) Active Problems:   Falls frequently   Principal Problem: Alcohol intoxication/alcohol withdrawal (HCC) -Continue to have tremors and delirium overnight. -Upon arrival Alcohol level 360, 54 -Extensive history of alcohol abuse, -Patient will be admitted to MedSurg, CIWA protocol initiated -Patient  detoxified, with aggressive IV fluid resuscitation, CIWA protocol,-following replacement     History of alcohol abuse -Patient has been counseled, recommend to abstain from alcohol products Management as above -Hemodynamically stable, remained to be hypertensive   Status post frequent falls -Likely due to to alcohol intoxication -Once detox, consulting PT OT for evaluation   Hypertension --Accelerated hypertension -Continue home medication of HCTZ and lisinopril, adding metoprolol 25 twice daily today -As needed hydralazine   History of COPD -Currently stable, satting greater 90% room air, not O2 dependent at home     Per record ?? history of hypothyroidism -Tatian normal 2.65  Home medication vitamin D, multivitamin-      Cultures:  -none  Antimicrobial: -none    Consults called:  None -------------------------------------------------------------------------------------------------------------------------------------------- DVT prophylaxis: SCD/Compression stockings and Heparin SQ Code Status:   Code Status: Full Code     Admission status: Patient will be admitted as Inpatient, with a greater than 2 midnight length of stay. Level of care: Med-Surg     Family Communication:  none at bedside  (The above findings and plan of care has been discussed with patient in detail, the patient expressed understanding and agreement of above plan)  --------------------------------------------------------------------------------------------------------------------------------------------------     PRN MEDs: sodium chloride, acetaminophen **OR** acetaminophen, bisacodyl, hydrALAZINE, HYDROmorphone (DILAUDID) injection, ipratropium, levalbuterol, ondansetron **OR** ondansetron (ZOFRAN) IV, oxyCODONE, senna-docusate, sodium chloride flush, traZODone reports that he has  been smoking cigarettes. He has been smoking an average of 1 pack per day. He has never used smokeless tobacco. He  reports current alcohol use. He reports that he does not use drugs.   Family History  Problem Relation Age of Onset   Thyroid disease Mother    Diabetes Other     Physical Exam:    Blood pressure (!) 160/78, pulse 82, temperature 97.7 F (36.5 C), temperature source Oral, resp. rate 18, height 6\' 2"  (1.88 m), weight 61.9 kg, SpO2 96 %.  Physical Exam:   General:  Alert, oriented, cooperative, no distress;   HEENT:  Normocephalic, PERRL, otherwise with in Normal limits   Neuro:  CNII-XII intact. , normal motor and sensation, reflexes intact   Lungs:   Clear to auscultation BL, Respirations unlabored, no wheezes / crackles  Cardio:    S1/S2, RRR, No murmure, No Rubs or Gallops   Abdomen:   Soft, non-tender, bowel sounds active all four quadrants,  no guarding or peritoneal signs.  Muscular skeletal:  Limited exam - in bed, able to move all 4 extremities, Normal strength,  2+ pulses,  symmetric, No pitting edema  Skin:  Dry, warm to touch, negative for any Rashes,  Wounds: Please see nursing documentation              Labs on admission:    I have personally reviewed following labs and imaging studies  CBC: Recent Labs  Lab 02/06/21 0142 02/06/21 1327 02/07/21 0450  WBC 5.1 5.6 5.1  NEUTROABS 2.5  --   --   HGB 11.7* 11.0* 11.1*  HCT 34.0* 32.0* 32.8*  MCV 104.3* 103.6* 102.8*  PLT 303 275 266   Basic Metabolic Panel: Recent Labs  Lab 02/06/21 0142 02/06/21 1327 02/07/21 0450  NA 140  --  136  K 3.9  --  3.8  CL 104  --  104  CO2 27  --  26  GLUCOSE 84  --  85  BUN 9  --  12  CREATININE 0.68 0.68 0.67  CALCIUM 8.1*  --  8.1*   GFR: Estimated Creatinine Clearance: 75.2 mL/min (by C-G formula based on SCr of 0.67 mg/dL). Liver Function Tests: Recent Labs  Lab 02/06/21 0142  AST 41  ALT 17  ALKPHOS 94  BILITOT 0.3  PROT 6.2*  ALBUMIN 2.9*   No results for input(s): LIPASE, AMYLASE in the last 168 hours. No results for input(s): AMMONIA in  the last 168 hours. Coagulation Profile: Recent Labs  Lab 02/07/21 0450  INR 1.0    CBG: Recent Labs  Lab 02/07/21 0858  GLUCAP 77   Lipid Profile: No results for input(s): CHOL, HDL, LDLCALC, TRIG, CHOLHDL, LDLDIRECT in the last 72 hours. Thyroid Function Tests: Recent Labs    02/06/21 0142  TSH 2.653   Anemia Panel: No results for input(s): VITAMINB12, FOLATE, FERRITIN, TIBC, IRON, RETICCTPCT in the last 72 hours. Urine analysis:    Component Value Date/Time   COLORURINE YELLOW 02/28/2018 1244   APPEARANCEUR HAZY (A) 02/28/2018 1244   LABSPEC 1.021 02/28/2018 1244   PHURINE 5.0 02/28/2018 1244   GLUCOSEU NEGATIVE 02/28/2018 1244   HGBUR NEGATIVE 02/28/2018 1244   BILIRUBINUR NEGATIVE 02/28/2018 1244   KETONESUR 20 (A) 02/28/2018 1244   PROTEINUR NEGATIVE 02/28/2018 1244   NITRITE NEGATIVE 02/28/2018 1244   LEUKOCYTESUR NEGATIVE 02/28/2018 1244     Radiologic Exams on Admission:   No results found.  EKG:   Independently reviewed.  Orders placed  or performed during the hospital encounter of 02/06/21   ED EKG   ED EKG   EKG 12-Lead    ---------------------------------------------------------------------------------------------------------------------------------  Time spent: > than  55  Min.   SIGNED: Kendell Bane, MD, FHM. [facility],  Pager (Please use [email or web address] to page to text)  If 7PM-7AM, please contact night-coverage [email or web address],  02/07/2021, 3:25 PM

## 2021-02-07 NOTE — Plan of Care (Signed)
°  Problem: Education: °Goal: Knowledge of General Education information will improve °Description: Including pain rating scale, medication(s)/side effects and non-pharmacologic comfort measures °Outcome: Not Progressing °  °Problem: Health Behavior/Discharge Planning: °Goal: Ability to manage health-related needs will improve °Outcome: Not Progressing °  °Problem: Clinical Measurements: °Goal: Ability to maintain clinical measurements within normal limits will improve °Outcome: Progressing °Goal: Will remain free from infection °Outcome: Progressing °Goal: Diagnostic test results will improve °Outcome: Progressing °Goal: Respiratory complications will improve °Outcome: Progressing °Goal: Cardiovascular complication will be avoided °Outcome: Progressing °  °Problem: Activity: °Goal: Risk for activity intolerance will decrease °Outcome: Not Progressing °  °Problem: Nutrition: °Goal: Adequate nutrition will be maintained °Outcome: Not Progressing °  °Problem: Coping: °Goal: Level of anxiety will decrease °Outcome: Not Progressing °  °Problem: Elimination: °Goal: Will not experience complications related to bowel motility °Outcome: Progressing °Goal: Will not experience complications related to urinary retention °Outcome: Progressing °  °Problem: Pain Managment: °Goal: General experience of comfort will improve °Outcome: Progressing °  °Problem: Safety: °Goal: Ability to remain free from injury will improve °Outcome: Progressing °  °Problem: Skin Integrity: °Goal: Risk for impaired skin integrity will decrease °Outcome: Progressing °  °

## 2021-02-07 NOTE — ED Notes (Signed)
Attempted report x 2 

## 2021-02-08 DIAGNOSIS — F10931 Alcohol use, unspecified with withdrawal delirium: Secondary | ICD-10-CM | POA: Diagnosis not present

## 2021-02-08 DIAGNOSIS — I10 Essential (primary) hypertension: Secondary | ICD-10-CM | POA: Diagnosis not present

## 2021-02-08 DIAGNOSIS — R296 Repeated falls: Secondary | ICD-10-CM | POA: Diagnosis not present

## 2021-02-08 LAB — BASIC METABOLIC PANEL
Anion gap: 7 (ref 5–15)
BUN: 8 mg/dL (ref 8–23)
CO2: 26 mmol/L (ref 22–32)
Calcium: 8.1 mg/dL — ABNORMAL LOW (ref 8.9–10.3)
Chloride: 103 mmol/L (ref 98–111)
Creatinine, Ser: 0.62 mg/dL (ref 0.61–1.24)
GFR, Estimated: >60 mL/min (ref >60)
Glucose, Bld: 93 mg/dL (ref 70–99)
Potassium: 3.4 mmol/L — ABNORMAL LOW (ref 3.5–5.1)
Sodium: 136 mmol/L (ref 135–145)

## 2021-02-08 LAB — CBC
HCT: 31.9 % — ABNORMAL LOW (ref 39.0–52.0)
Hemoglobin: 10.7 g/dL — ABNORMAL LOW (ref 13.0–17.0)
MCH: 34.3 pg — ABNORMAL HIGH (ref 26.0–34.0)
MCHC: 33.5 g/dL (ref 30.0–36.0)
MCV: 102.2 fL — ABNORMAL HIGH (ref 80.0–100.0)
Platelets: 225 10*3/uL (ref 150–400)
RBC: 3.12 MIL/uL — ABNORMAL LOW (ref 4.22–5.81)
RDW: 13.9 % (ref 11.5–15.5)
WBC: 4.2 10*3/uL (ref 4.0–10.5)
nRBC: 0 % (ref 0.0–0.2)

## 2021-02-08 LAB — GLUCOSE, CAPILLARY
Glucose-Capillary: 85 mg/dL (ref 70–99)
Glucose-Capillary: 139 mg/dL — ABNORMAL HIGH (ref 70–99)
Glucose-Capillary: 106 mg/dL — ABNORMAL HIGH (ref 70–99)

## 2021-02-08 MED ORDER — LORAZEPAM 1 MG PO TABS: 0.0000 mg | ORAL_TABLET | Freq: Four times a day (QID) | ORAL | Status: DC

## 2021-02-08 MED ORDER — LORAZEPAM 2 MG/ML IJ SOLN
0.0000 mg | Freq: Four times a day (QID) | INTRAMUSCULAR | Status: AC
  Administered 2021-02-08 (×2): 2 mg via INTRAVENOUS
  Administered 2021-02-09: 1 mg via INTRAVENOUS
  Administered 2021-02-09 (×2): 2 mg via INTRAVENOUS
  Administered 2021-02-09: 4 mg via INTRAVENOUS
  Administered 2021-02-10: 2 mg via INTRAVENOUS
  Filled 2021-02-08 (×5): qty 1
  Filled 2021-02-08: qty 2

## 2021-02-08 NOTE — Plan of Care (Signed)
  Problem: Acute Rehab PT Goals(only PT should resolve) Goal: Pt Will Go Supine/Side To Sit Outcome: Progressing Flowsheets (Taken 02/08/2021 1413) Pt will go Supine/Side to Sit:  with supervision  with min guard assist Goal: Patient Will Transfer Sit To/From Stand Outcome: Progressing Flowsheets (Taken 02/08/2021 1413) Patient will transfer sit to/from stand:  with supervision  with min guard assist Goal: Pt Will Transfer Bed To Chair/Chair To Bed Outcome: Progressing Flowsheets (Taken 02/08/2021 1413) Pt will Transfer Bed to Chair/Chair to Bed:  min guard assist  with min assist Goal: Pt Will Ambulate Outcome: Progressing Flowsheets (Taken 02/08/2021 1413) Pt will Ambulate:  50 feet  with min guard assist  with minimal assist  with rolling walker   2:16 PM, 02/08/21 Ocie Bob, MPT Physical Therapist with [facility] [phone] office [phone] mobile phone

## 2021-02-08 NOTE — Progress Notes (Signed)
Progress note  Patient: Larene Pickett                PCP: Benita Stabile, MD                    DOB: 05-13-1950            DOA: 02/06/2021 [id number]             DOS: 02/08/2021, 12:17 PM  Benita Stabile, MD  Patient coming from:   HOME  I have personally reviewed patient's medical records, in electronic medical records, including:  [place] [facility], and care everywhere.    Subjective:   The patient was seen and examined this morning, still complains of a generalized basis, improved tremors, nursing poor p.o. intake  History of present illness:    SHERREL SHAFER is a 70 y.o. male with medical history significant of hypertension, chronic alcohol use, hypothyroidism, COPD, presented to the ED intoxicated... Subsequently upon evaluation in ED patient started having tremors, falls....  Procollagen initiated, requested admission for inpatient detox. Admitted with drinking half quart day,... Likely more, for many years, last drink last night  Patient Denies having: Fever, Chills, Cough, SOB, Chest Pain, Abd pain, N/V/D, headache, dizziness, lightheadedness,  Dysuria, Joint pain, rash, open wounds  ED Course:   Vitals:   02/07/21 2057 02/08/21 0412  BP: (!) 159/89 (!) 157/83  Pulse: 73 83  Resp: 18 19  Temp: 98.3 F (36.8 C) 97.6 F (36.4 C)  SpO2: 95% 99%   Abnormal labs; hemoglobin 11.7, sodium 140, potassium 3.9, within normal notes exception of calcium 8.1, albumin 2.9, LFTs otherwise within normal limits Alcohol level 360, 54 SARS-CoV-2 negative  Assessment / Plan:    Principal Problem:   Alcohol withdrawal (HCC) Active Problems:   Falls frequently   Principal Problem: Alcohol intoxication/alcohol withdrawal (HCC) -Improved tremors and delirium -Unsteady gait, generalized weaknesses  -Upon arrival Alcohol level 360, 54 -Extensive history of alcohol abuse, -Patient will be admitted to MedSurg, CIWA protocol initiated -Patient detoxified, with aggressive IV fluid  resuscitation, CIWA protocol,-following replacement -Discontinue IV fluids, encouraging p.o. intake and oral hydration     History of alcohol abuse -Patient has been counseled, recommend to abstain from alcohol products -As above -Hemodynamically stable, remained to be hypertensive -TOC consulted to provide resources for alcohol cessation, AA   Status post frequent falls -Likely due to to alcohol intoxication -Once detox, consulting PT OT -evaluation   Hypertension --Accelerated hypertension -Continue home medication of HCTZ and lisinopril, adding metoprolol 25 twice daily today -As needed hydralazine   History of COPD -Currently stable, satting greater 90% room air, not O2 dependent at home     Per record ?? history of hypothyroidism -Tatian normal 2.65 -normal  Home medication vitamin D, multivitamin-      Cultures:  -none  Antimicrobial: -none    Consults called:  None -------------------------------------------------------------------------------------------------------------------------------------------- DVT prophylaxis: SCD/Compression stockings and Heparin SQ Code Status:   Code Status: Full Code     Admission status: Level of care: Med-Surg     Family Communication:  none at bedside  (The above findings and plan of care has been discussed with patient in detail, the patient expressed understanding and agreement of above plan)   Disposition: Home, anticipated return home with home health in 1-2 days  --------------------------------------------------------------------------------------------------------------------------------     PRN MEDs: sodium chloride, acetaminophen **OR** acetaminophen, bisacodyl, hydrALAZINE, HYDROmorphone (DILAUDID) injection, ipratropium, levalbuterol, ondansetron **OR** ondansetron (ZOFRAN) IV, oxyCODONE, senna-docusate,  sodium chloride flush, traZODone reports that he has been smoking cigarettes. He has been smoking an average  of 1 pack per day. He has never used smokeless tobacco. He reports current alcohol use. He reports that he does not use drugs.   Family History  Problem Relation Age of Onset   Thyroid disease Mother    Diabetes Other     Physical Exam:   Blood pressure (!) 157/83, pulse 83, temperature 97.6 F (36.4 C), temperature source Oral, resp. rate 19, height 6\' 2"  (1.88 m), weight 60.9 kg, SpO2 99 %.    Physical Exam:   General:  Alert, oriented, cooperative, no distress;   HEENT:  Normocephalic, PERRL, otherwise with in Normal limits   Neuro:  CNII-XII intact. , normal motor and sensation, reflexes intact   Lungs:   Clear to auscultation BL, Respirations unlabored, no wheezes / crackles  Cardio:    S1/S2, RRR, No murmure, No Rubs or Gallops   Abdomen:   Soft, non-tender, bowel sounds active all four quadrants,  no guarding or peritoneal signs.  Muscular skeletal:  Limited exam - in bed, able to move all 4 extremities, global generalized weakness 2+ pulses,  symmetric, No pitting edema  Skin:  Dry, warm to touch, negative for any Rashes,  Wounds: Please see nursing documentation           Labs on admission:    I have personally reviewed following labs and imaging studies  CBC: Recent Labs  Lab 02/06/21 0142 02/06/21 1327 02/07/21 0450 02/08/21 0602  WBC 5.1 5.6 5.1 4.2  NEUTROABS 2.5  --   --   --   HGB 11.7* 11.0* 11.1* 10.7*  HCT 34.0* 32.0* 32.8* 31.9*  MCV 104.3* 103.6* 102.8* 102.2*  PLT 303 275 266 225   Basic Metabolic Panel: Recent Labs  Lab 02/06/21 0142 02/06/21 1327 02/07/21 0450 02/08/21 0602  NA 140  --  136 136  K 3.9  --  3.8 3.4*  CL 104  --  104 103  CO2 27  --  26 26  GLUCOSE 84  --  85 93  BUN 9  --  12 8  CREATININE 0.68 0.68 0.67 0.62  CALCIUM 8.1*  --  8.1* 8.1*   GFR: Estimated Creatinine Clearance: 74 mL/min (by C-G formula based on SCr of 0.62 mg/dL). Liver Function Tests: Recent Labs  Lab 02/06/21 0142  AST 41  ALT 17   ALKPHOS 94  BILITOT 0.3  PROT 6.2*  ALBUMIN 2.9*   No results for input(s): LIPASE, AMYLASE in the last 168 hours. No results for input(s): AMMONIA in the last 168 hours. Coagulation Profile: Recent Labs  Lab 02/07/21 0450  INR 1.0    CBG: Recent Labs  Lab 02/07/21 0858 02/08/21 0710 02/08/21 1130  GLUCAP 77 85 139*   Lipid Profile: No results for input(s): CHOL, HDL, LDLCALC, TRIG, CHOLHDL, LDLDIRECT in the last 72 hours. Thyroid Function Tests: Recent Labs    02/06/21 0142  TSH 2.653   Anemia Panel: No results for input(s): VITAMINB12, FOLATE, FERRITIN, TIBC, IRON, RETICCTPCT in the last 72 hours. Urine analysis:    Component Value Date/Time   COLORURINE YELLOW 02/28/2018 1244   APPEARANCEUR HAZY (A) 02/28/2018 1244   LABSPEC 1.021 02/28/2018 1244   PHURINE 5.0 02/28/2018 1244   GLUCOSEU NEGATIVE 02/28/2018 1244   HGBUR NEGATIVE 02/28/2018 1244   BILIRUBINUR NEGATIVE 02/28/2018 1244   KETONESUR 20 (A) 02/28/2018 1244   PROTEINUR NEGATIVE 02/28/2018 1244  NITRITE NEGATIVE 02/28/2018 1244   LEUKOCYTESUR NEGATIVE 02/28/2018 1244     Radiologic Exams on Admission:   No results found.  EKG:   Independently reviewed.  Orders placed or performed during the hospital encounter of 02/06/21   ED EKG   ED EKG   EKG 12-Lead    ---------------------------------------------------------------------------------------------------------------------------------  Time spent: > than  35  Min.   SIGNED: Kendell Bane, MD, FHM. [facility],  Pager (Please use [email or web address] to page to text)  If 7PM-7AM, please contact night-coverage [email or web address],  02/08/2021, 12:17 PM

## 2021-02-08 NOTE — Plan of Care (Signed)
  Problem: Acute Rehab OT Goals (only OT should resolve) Goal: Pt. Will Perform Grooming Flowsheets (Taken 02/08/2021 1230) Pt Will Perform Grooming:  standing  with min guard assist  with adaptive equipment Goal: Pt. Will Perform Upper Body Dressing Flowsheets (Taken 02/08/2021 1230) Pt Will Perform Upper Body Dressing:  Independently  sitting Goal: Pt. Will Perform Lower Body Dressing Flowsheets (Taken 02/08/2021 1230) Pt Will Perform Lower Body Dressing:  with min guard assist  sitting/lateral leans  sit to/from stand Goal: Pt. Will Transfer To Toilet Flowsheets (Taken 02/08/2021 1230) Pt Will Transfer to Toilet:  with supervision  stand pivot transfer Goal: Pt/Caregiver Will Perform Home Exercise Program Flowsheets (Taken 02/08/2021 1230) Pt/caregiver will Perform Home Exercise Program:  Increased ROM  Increased strength  Right Upper extremity  Left upper extremity  With Supervision  Lonnetta Kniskern OT, MOT

## 2021-02-08 NOTE — Evaluation (Signed)
Physical Therapy Evaluation Patient Details Name: Larry [name] MRN: [id number] DOB: 14-Feb-1951 Today's Date: 02/08/2021  History of Present Illness  Larry [name] is a 70 y.o. male with medical history significant of hypertension, chronic alcohol use, hypothyroidism, COPD, presented to the ED intoxicated... Subsequently upon evaluation in ED patient started having tremors, falls....  Procollagen initiated, requested admission for inpatient detox.  Admitted with drinking half quart day,... Likely more, for many years, last drink last night   Clinical Impression  Patient presents alert and slightly lethargic and able to follow most directions with repeated verbal/tactile cueing.  Patient very unsteady on feet with trembling of extremities and limited to a few side steps before having to sit due to poor standing balance and fatigue.  Patient tolerated sitting up in chair after therapy - nursing staff notified.  Patient will benefit from continued physical therapy in hospital and recommended venue below to increase strength, balance, endurance for safe ADLs and gait.       Recommendations for follow up therapy are one component of a multi-disciplinary discharge planning process, led by the attending physician.  Recommendations may be updated based on patient status, additional functional criteria and insurance authorization.  Follow Up Recommendations SNF    Equipment Recommendations  Rolling walker with 5" wheels    Recommendations for Other Services       Precautions / Restrictions Precautions Precautions: Fall Restrictions Weight Bearing Restrictions: No      Mobility  Bed Mobility Overal bed mobility: Needs Assistance Bed Mobility: Supine to Sit;Sit to Supine     Supine to sit: Mod assist Sit to supine: Mod assist   General bed mobility comments: unsteady shaky movement    Transfers Overall transfer level: Needs assistance Equipment used: Rolling walker (2  wheeled) Transfers: Sit to/from [employer] Sit to Stand: Mod assist Stand pivot transfers: Mod assist       General transfer comment: unsteady labored movement with tremors and fair/poor standing balance using RW  Ambulation/Gait Ambulation/Gait assistance: Mod assist Gait Distance (Feet): 5 Feet Assistive device: Rolling walker (2 wheeled) Gait Pattern/deviations: Decreased step length - right;Decreased step length - left;Decreased stride length Gait velocity: decreased   General Gait Details: limited to a few slow labored unsteady side steps due to generalized weakness and fatigue  Stairs            Wheelchair Mobility    Modified Rankin (Stroke Patients Only)       Balance Overall balance assessment: Needs assistance Sitting-balance support: Feet supported;No upper extremity supported Sitting balance-Leahy Scale: Fair Sitting balance - Comments: seated at EOB   Standing balance support: Bilateral upper extremity supported;During functional activity Standing balance-Leahy Scale: Poor Standing balance comment: fair/poor using RW                             Pertinent Vitals/Pain Pain Assessment: No/denies pain    Home Living Family/patient expects to be discharged to:: Private residence Living Arrangements: Alone Available Help at Discharge: Friend(s);Available PRN/intermittently;Personal care attendant Type of Home: House Home Access: Stairs to enter Entrance Stairs-Rails: Can reach both;Right;Left Entrance Stairs-Number of Steps: 4 Home Layout: One level Home Equipment: Walker - 4 wheels;Bedside commode Additional Comments: Per pt, report. Pt reports cleaning person comes 2 to 3 times a week.    Prior Function Level of Independence: Needs assistance   Gait / Transfers Assistance Needed: household ambulator leaning on walls and furniture  ADL's /  Homemaking Assistance Needed: Indepdnent for bathing and dressing. Able to cook  independently. Assisted by cleaning person for cleaning. Friend/neighbor gets groceries for pt.        Hand Dominance   Dominant Hand: Right    Extremity/Trunk Assessment   Upper Extremity Assessment Upper Extremity Assessment: Defer to OT evaluation RUE Deficits / Details: Pt reported previous injury with plates and screws. Limited to ~90* A/ROm and P/ROM for shoulder flexion. Generally weak. Tremors in B UE. RUE Coordination: decreased gross motor;decreased fine motor (tremors)    Lower Extremity Assessment Lower Extremity Assessment: Generalized weakness    Cervical / Trunk Assessment Cervical / Trunk Assessment: Normal  Communication   Communication: No difficulties  Cognition Arousal/Alertness: Awake/alert Behavior During Therapy: WFL for tasks assessed/performed Overall Cognitive Status: Within Functional Limits for tasks assessed                                 General Comments: Oreiented to place, person, and one month off of date.      General Comments      Exercises     Assessment/Plan    PT Assessment Patient needs continued PT services  PT Problem List Decreased strength;Decreased activity tolerance;Decreased balance;Decreased mobility       PT Treatment Interventions DME instruction;Gait training;Stair training;Functional mobility training;Therapeutic activities;Therapeutic exercise;Balance training;Patient/family education    PT Goals (Current goals can be found in the Care Plan section)  Acute Rehab PT Goals Patient Stated Goal: return home PT Goal Formulation: With patient Time For Goal Achievement: 02/22/21 Potential to Achieve Goals: Good    Frequency Min 3X/week   Barriers to discharge        Co-evaluation PT/OT/SLP Co-Evaluation/Treatment: Yes Reason for Co-Treatment: Complexity of the patient's impairments (multi-system involvement);To address functional/ADL transfers PT goals addressed during session: Mobility/safety  with mobility;Balance;Proper use of DME OT goals addressed during session: ADL's and self-care       AM-PAC PT "6 Clicks" Mobility  Outcome Measure Help needed turning from your back to your side while in a flat bed without using bedrails?: A Little Help needed moving from lying on your back to sitting on the side of a flat bed without using bedrails?: A Lot Help needed moving to and from a bed to a chair (including a wheelchair)?: A Lot Help needed standing up from a chair using your arms (e.g., wheelchair or bedside chair)?: A Lot Help needed to walk in hospital room?: A Lot Help needed climbing 3-5 steps with a railing? : Total 6 Click Score: 12    End of Session   Activity Tolerance: Patient tolerated treatment well;Patient limited by fatigue Patient left: in chair;with call bell/phone within reach;with chair alarm set Nurse Communication: Mobility status PT Visit Diagnosis: Unsteadiness on feet (R26.81);Other abnormalities of gait and mobility (R26.89);Muscle weakness (generalized) (M62.81)    Time: 1120-1150 PT Time Calculation (min) (ACUTE ONLY): 30 min   Charges:   PT Evaluation $PT Eval Moderate Complexity: 1 Mod PT Treatments $Therapeutic Activity: 23-37 mins        2:12 PM, 02/08/21 Ocie Bob, MPT Physical Therapist with [facility] [phone] office [phone] mobile phone

## 2021-02-08 NOTE — Evaluation (Signed)
Occupational Therapy Evaluation Patient Details Name: Larry [name] MRN: [id number] DOB: 10/22/1950 Today's Date: 02/08/2021   History of Present Illness Larry [name] is a 70 y.o. male with medical history significant of hypertension, chronic alcohol use, hypothyroidism, COPD, presented to the ED intoxicated... Subsequently upon evaluation in ED patient started having tremors, falls....  Procollagen initiated, requested admission for inpatient detox.  Admitted with drinking half quart day,... Likely more, for many years, last drink last night   Clinical Impression   Pt agreeable to OT/PT co-evaluation. Pt demonstrates need for mod A for bed mobility with fair seated EOB balance. Pt requires mod to max A for stand pivot transfer to chair with poor standing balance. Pt has limited R shoulder and elbow mobility. Pt reports baseline injury with plates and screws. Pt unsteady with tremors when attempting mobility. Pt left in chair with call bell within reach and chair alarm set. Pt will benefit from continued OT in the hospital and recommended venue below to increase strength, balance, and endurance for safe ADL's.        Recommendations for follow up therapy are one component of a multi-disciplinary discharge planning process, led by the attending physician.  Recommendations may be updated based on patient status, additional functional criteria and insurance authorization.   Follow Up Recommendations  SNF    Equipment Recommendations  None recommended by OT           Precautions / Restrictions Precautions Precautions: Fall Restrictions Weight Bearing Restrictions: No      Mobility Bed Mobility Overal bed mobility: Needs Assistance Bed Mobility: Supine to Sit;Sit to Supine     Supine to sit: Mod assist Sit to supine: Mod assist   General bed mobility comments: slow labored movement    Transfers Overall transfer level: Needs assistance Equipment used: Rolling walker (2  wheeled) Transfers: Sit to/from [employer] Sit to Stand: Mod assist Stand pivot transfers: Mod assist       General transfer comment: Pt demonstrates labored movement with tremors and poor balance.    Balance Overall balance assessment: Needs assistance Sitting-balance support: No upper extremity supported;Feet supported Sitting balance-Leahy Scale: Fair Sitting balance - Comments: seated at EOB   Standing balance support: Bilateral upper extremity supported;During functional activity Standing balance-Leahy Scale: Poor Standing balance comment: using RW                           ADL either performed or assessed with clinical judgement   ADL Overall ADL's : Needs assistance/impaired                 Upper Body Dressing : Minimal assistance;Sitting Upper Body Dressing Details (indicate cue type and reason): donning gown seated in chair     Toilet Transfer: Moderate assistance;RW;Stand-pivot Toilet Transfer Details (indicate cue type and reason): simulated EOB to chair with RW           General ADL Comments: Pt dmeonstrates slow labored movement with B UE tremors.     Vision Baseline Vision/History: 1 Wears glasses Ability to See in Adequate Light: 1 Impaired (Pt reports wearing glasses but not donning today. Pt reports vision is decreasing chronically.) Patient Visual Report: No change from baseline Vision Assessment?:  (No deficits other than baseline reports observed during session.)                Pertinent Vitals/Pain Pain Assessment: No/denies pain     Hand Dominance Right  Extremity/Trunk Assessment Upper Extremity Assessment Upper Extremity Assessment: RUE deficits/detail (L UE generally weak.) RUE Deficits / Details: Pt reported previous injury with plates and screws. Limited to ~90* A/ROm and P/ROM for shoulder flexion. Generally weak. Tremors in B UE. RUE Coordination: decreased gross motor;decreased fine motor  (tremors)   Lower Extremity Assessment Lower Extremity Assessment: Defer to PT evaluation   Cervical / Trunk Assessment Cervical / Trunk Assessment: Normal   Communication Communication Communication: No difficulties   Cognition Arousal/Alertness: Awake/alert Behavior During Therapy: WFL for tasks assessed/performed Overall Cognitive Status: Within Functional Limits for tasks assessed                                 General Comments: Oreiented to place, person, and one month off of date.              Home Living Family/patient expects to be discharged to:: Private residence Living Arrangements: Alone Available Help at Discharge: Friend(s);Available PRN/intermittently;Personal care attendant Type of Home: House Home Access: Stairs to enter [employer] of Steps: 4 Entrance Stairs-Rails: Can reach both;Right;Left Home Layout: One level     Bathroom Shower/Tub: Chief Strategy Officer: Standard Bathroom Accessibility: Yes How Accessible: Accessible via walker Home Equipment: Walker - 4 wheels;Bedside commode   Additional Comments: Per pt, report. Pt reports cleaning person comes 2 to 3 times a week.      Prior Functioning/Environment Level of Independence: Needs assistance  Gait / Transfers Assistance Needed: household ambulator leaning on walls and furniture ADL's / Homemaking Assistance Needed: Indepdnent for bathing and dressing. Able to cook independently. Assisted by cleaning person for cleaning. Friend/neighbor gets groceries for pt.            OT Problem List: Decreased strength;Decreased activity tolerance;Impaired balance (sitting and/or standing);Decreased coordination;Decreased safety awareness;Decreased knowledge of use of DME or AE      OT Treatment/Interventions: Self-care/ADL training;Therapeutic exercise;Patient/family education;Balance training;Therapeutic activities;DME and/or AE instruction    OT Goals(Current  goals can be found in the care plan section) Acute Rehab OT Goals Patient Stated Goal: return home OT Goal Formulation: With patient Time For Goal Achievement: 02/22/21 Potential to Achieve Goals: Good  OT Frequency: Min 2X/week   Barriers to D/C:            Co-evaluation PT/OT/SLP Co-Evaluation/Treatment: Yes Reason for Co-Treatment: To address functional/ADL transfers   OT goals addressed during session: ADL's and self-care                       End of Session Equipment Utilized During Treatment: Rolling walker Nurse Communication: Mobility status  Activity Tolerance: Patient tolerated treatment well Patient left: in chair;with call bell/phone within reach;with chair alarm set  OT Visit Diagnosis: Unsteadiness on feet (R26.81);Muscle weakness (generalized) (M62.81);History of falling (Z91.81)                Time: 9735-3299 OT Time Calculation (min): 20 min Charges:  OT General Charges $OT Visit: 1 Visit OT Evaluation $OT Eval Moderate Complexity: 1 Mod  Janesa Dockery OT, MOT  Danie Chandler 02/08/2021, 12:27 PM

## 2021-02-09 DIAGNOSIS — I10 Essential (primary) hypertension: Secondary | ICD-10-CM | POA: Diagnosis not present

## 2021-02-09 DIAGNOSIS — F10931 Alcohol use, unspecified with withdrawal delirium: Secondary | ICD-10-CM | POA: Diagnosis not present

## 2021-02-09 DIAGNOSIS — R296 Repeated falls: Secondary | ICD-10-CM | POA: Diagnosis not present

## 2021-02-09 LAB — CBC
HCT: 34.4 % — ABNORMAL LOW (ref 39.0–52.0)
Hemoglobin: 11.4 g/dL — ABNORMAL LOW (ref 13.0–17.0)
MCH: 34.4 pg — ABNORMAL HIGH (ref 26.0–34.0)
MCHC: 33.1 g/dL (ref 30.0–36.0)
MCV: 103.9 fL — ABNORMAL HIGH (ref 80.0–100.0)
Platelets: 191 10*3/uL (ref 150–400)
RBC: 3.31 MIL/uL — ABNORMAL LOW (ref 4.22–5.81)
RDW: 13.8 % (ref 11.5–15.5)
WBC: 4.7 10*3/uL (ref 4.0–10.5)
nRBC: 0 % (ref 0.0–0.2)

## 2021-02-09 LAB — BASIC METABOLIC PANEL
Anion gap: 6 (ref 5–15)
BUN: 7 mg/dL — ABNORMAL LOW (ref 8–23)
CO2: 25 mmol/L (ref 22–32)
Calcium: 8 mg/dL — ABNORMAL LOW (ref 8.9–10.3)
Chloride: 105 mmol/L (ref 98–111)
Creatinine, Ser: 0.62 mg/dL (ref 0.61–1.24)
GFR, Estimated: >60 mL/min (ref >60)
Glucose, Bld: 91 mg/dL (ref 70–99)
Potassium: 3.6 mmol/L (ref 3.5–5.1)
Sodium: 136 mmol/L (ref 135–145)

## 2021-02-09 LAB — GLUCOSE, CAPILLARY: Glucose-Capillary: 81 mg/dL (ref 70–99)

## 2021-02-09 MED ORDER — CHLORDIAZEPOXIDE HCL 5 MG PO CAPS
10.0000 mg | ORAL_CAPSULE | Freq: Four times a day (QID) | ORAL | Status: AC
  Administered 2021-02-09 – 2021-02-12 (×12): 10 mg via ORAL
  Filled 2021-02-09 (×12): qty 2

## 2021-02-09 NOTE — Progress Notes (Signed)
Progress note  Patient: Larry [name]                PCP: Benita Stabile, MD                    DOB: 17-Mar-1951            DOA: 02/06/2021 WIO:[id number]             DOS: 02/09/2021, 12:13 PM  Benita Stabile, MD  Patient coming from:   HOME  I have personally reviewed patient's medical records, in electronic medical records, including:  [place] link, and care everywhere.    Subjective:   The patient was seen and examined this morning,  -affect is anxious, disoriented, full-blown DTs  -Tremulous, confused disoriented History of present illness:    Larry [name] is a 70 y.o. male with medical history significant of hypertension, chronic alcohol use, hypothyroidism, COPD, presented to the ED intoxicated... Subsequently upon evaluation in ED patient started having tremors, falls....  Procollagen initiated, requested admission for inpatient detox. Admitted with drinking half quart day,... Likely more, for many years, last drink last night  Patient Denies having: Fever, Chills, Cough, SOB, Chest Pain, Abd pain, N/V/D, headache, dizziness, lightheadedness,  Dysuria, Joint pain, rash, open wounds  ED Course:   Vitals:   02/09/21 0830 02/09/21 1143  BP:  (!) 143/71  Pulse: 70 (!) 49  Resp:  16  Temp:  97.7 F (36.5 C)  SpO2:  97%   Abnormal labs; hemoglobin 11.7, sodium 140, potassium 3.9, within normal notes exception of calcium 8.1, albumin 2.9, LFTs otherwise within normal limits Alcohol level 360, 54 SARS-CoV-2 negative  Assessment / Plan:    Principal Problem:   Alcohol withdrawal (HCC) Active Problems:   Falls frequently   Principal Problem: Alcohol intoxication/alcohol withdrawal (HCC) -affect is anxious, disoriented, full-blown DTs with significant tremor -Add scheduled Librium - continue lorazepam per CIWA protocol along with folic acid and thiamine -Unsteady gait, generalized weaknesses -Upon arrival Alcohol level 360, 54 -Extensive history of alcohol  abuse,    Status post frequent falls --Alcohol intoxication is contributory -PT OT recommends SNF rehab  Hypertension --Accelerated hypertension -Continue home medication of HCTZ and lisinopril, adding metoprolol 25 twice daily today -Continue metoprolol 25 mg twice daily, lisinopril HCTZ 10/12.5 -As needed hydralazine   History of COPD -stable, continue bronchodilators as needed     Per record ?? history of hypothyroidism -TSH normal at 2.65 - -Does not appear to be on thyroid medication PTA will not start at this time  Disposition--- anticipate discharge to SNF rehab versus home with home health after resolution of DTs currently with full-blown DTs requiring p.o. Librium and IV lorazepam    Cultures:  -none  Antimicrobial: -none    Consults called:  None -------------------------------------------------------------------------------------------------------------------------------------------- DVT prophylaxis: SCD/Compression stockings and Heparin SQ Code Status:   Code Status: Full Code     Admission status: Level of care: Med-Surg     Family Communication:  none at bedside  (The above findings and plan of care has been discussed with patient in detail, the patient expressed understanding and agreement of above plan)   --------------------------------------------------------------------------------------------------------------------------------  PRN MEDs: sodium chloride, acetaminophen **OR** acetaminophen, bisacodyl, hydrALAZINE, HYDROmorphone (DILAUDID) injection, ipratropium, levalbuterol, ondansetron **OR** ondansetron (ZOFRAN) IV, oxyCODONE, senna-docusate, sodium chloride flush, traZODone reports that he has been smoking cigarettes. He has been smoking an average of 1 pack per day. He has never used smokeless tobacco. He reports current  alcohol use. He reports that he does not use drugs.   Family History  Problem Relation Age of Onset   Thyroid disease Mother     Diabetes Other     Physical Exam:   Blood pressure (!) 157/83, pulse 83, temperature 97.6 F (36.4 C), temperature source Oral, resp. rate 19, height 6\' 2"  (1.88 m), weight 60.9 kg, SpO2 99 %.   Physical Exam  Gen:- Awake Alert,   HEENT:- [place].AT, No sclera icterus Neck-Supple Neck,No JVD,.  Lungs-  CTAB , fair air movement CV- S1, S2 normal, regular Abd-  +ve B.Sounds, Abd Soft, No tenderness,    Extremity/Skin:- No  edema,   good pedal pulses Psych-affect is anxious, disoriented, full-blown DTs  neuro-no new focal deficits, significant tremors   Labs on admission:    I have personally reviewed following labs and imaging studies  CBC: Recent Labs  Lab 02/06/21 0142 02/06/21 1327 02/07/21 0450 02/08/21 0602 02/09/21 0624  WBC 5.1 5.6 5.1 4.2 4.7  NEUTROABS 2.5  --   --   --   --   HGB 11.7* 11.0* 11.1* 10.7* 11.4*  HCT 34.0* 32.0* 32.8* 31.9* 34.4*  MCV 104.3* 103.6* 102.8* 102.2* 103.9*  PLT 303 275 266 225 191   Basic Metabolic Panel: Recent Labs  Lab 02/06/21 0142 02/06/21 1327 02/07/21 0450 02/08/21 0602 02/09/21 0624  NA 140  --  136 136 136  K 3.9  --  3.8 3.4* 3.6  CL 104  --  104 103 105  CO2 27  --  26 26 25   GLUCOSE 84  --  85 93 91  BUN 9  --  12 8 7*  CREATININE 0.68 0.68 0.67 0.62 0.62  CALCIUM 8.1*  --  8.1* 8.1* 8.0*   GFR: Estimated Creatinine Clearance: 75.8 mL/min (by C-G formula based on SCr of 0.62 mg/dL). Liver Function Tests: Recent Labs  Lab 02/06/21 0142  AST 41  ALT 17  ALKPHOS 94  BILITOT 0.3  PROT 6.2*  ALBUMIN 2.9*   No results for input(s): LIPASE, AMYLASE in the last 168 hours. No results for input(s): AMMONIA in the last 168 hours. Coagulation Profile: Recent Labs  Lab 02/07/21 0450  INR 1.0    CBG: Recent Labs  Lab 02/07/21 0858 02/08/21 0710 02/08/21 1130 02/08/21 1633 02/09/21 0805  GLUCAP 77 85 139* 106* 81   Lipid Profile: No results for input(s): CHOL, HDL, LDLCALC, TRIG, CHOLHDL,  LDLDIRECT in the last 72 hours. Thyroid Function Tests: No results for input(s): TSH, T4TOTAL, FREET4, T3FREE, THYROIDAB in the last 72 hours.  Anemia Panel: No results for input(s): VITAMINB12, FOLATE, FERRITIN, TIBC, IRON, RETICCTPCT in the last 72 hours. Urine analysis:    Component Value Date/Time   COLORURINE YELLOW 02/28/2018 1244   APPEARANCEUR HAZY (A) 02/28/2018 1244   LABSPEC 1.021 02/28/2018 1244   PHURINE 5.0 02/28/2018 1244   GLUCOSEU NEGATIVE 02/28/2018 1244   HGBUR NEGATIVE 02/28/2018 1244   BILIRUBINUR NEGATIVE 02/28/2018 1244   KETONESUR 20 (A) 02/28/2018 1244   PROTEINUR NEGATIVE 02/28/2018 1244   NITRITE NEGATIVE 02/28/2018 1244   LEUKOCYTESUR NEGATIVE 02/28/2018 1244    Radiologic Exams on Admission:   No results found.  EKG:   Independently reviewed.  Orders placed or performed during the hospital encounter of 02/06/21   ED EKG   ED EKG   EKG 12-Lead    SIGNED: 13/10/2017, MD, [facility],  Pager (Please use [email or web address] to page to text)  If 7PM-7AM, please  contact night-coverage [email or web address],  02/09/2021, 12:13 PM

## 2021-02-10 DIAGNOSIS — F10931 Alcohol use, unspecified with withdrawal delirium: Secondary | ICD-10-CM | POA: Diagnosis not present

## 2021-02-10 DIAGNOSIS — R296 Repeated falls: Secondary | ICD-10-CM | POA: Diagnosis not present

## 2021-02-10 DIAGNOSIS — I10 Essential (primary) hypertension: Secondary | ICD-10-CM | POA: Diagnosis not present

## 2021-02-10 LAB — RENAL FUNCTION PANEL
Albumin: 2.4 g/dL — ABNORMAL LOW (ref 3.5–5.0)
Anion gap: 6 (ref 5–15)
BUN: 6 mg/dL — ABNORMAL LOW (ref 8–23)
CO2: 25 mmol/L (ref 22–32)
Calcium: 8.2 mg/dL — ABNORMAL LOW (ref 8.9–10.3)
Chloride: 104 mmol/L (ref 98–111)
Creatinine, Ser: 0.54 mg/dL — ABNORMAL LOW (ref 0.61–1.24)
GFR, Estimated: >60 mL/min (ref >60)
Glucose, Bld: 97 mg/dL (ref 70–99)
Phosphorus: 3.4 mg/dL (ref 2.5–4.6)
Potassium: 3.1 mmol/L — ABNORMAL LOW (ref 3.5–5.1)
Sodium: 135 mmol/L (ref 135–145)

## 2021-02-10 LAB — CBC
HCT: 36.3 % — ABNORMAL LOW (ref 39.0–52.0)
Hemoglobin: 12.4 g/dL — ABNORMAL LOW (ref 13.0–17.0)
MCH: 34.5 pg — ABNORMAL HIGH (ref 26.0–34.0)
MCHC: 34.2 g/dL (ref 30.0–36.0)
MCV: 101.1 fL — ABNORMAL HIGH (ref 80.0–100.0)
Platelets: 193 10*3/uL (ref 150–400)
RBC: 3.59 MIL/uL — ABNORMAL LOW (ref 4.22–5.81)
RDW: 13.6 % (ref 11.5–15.5)
WBC: 5 10*3/uL (ref 4.0–10.5)
nRBC: 0 % (ref 0.0–0.2)

## 2021-02-10 LAB — GLUCOSE, CAPILLARY: Glucose-Capillary: 91 mg/dL (ref 70–99)

## 2021-02-10 LAB — MAGNESIUM: Magnesium: 1.5 mg/dL — ABNORMAL LOW (ref 1.7–2.4)

## 2021-02-10 MED ORDER — POTASSIUM CHLORIDE CRYS ER 20 MEQ PO TBCR
40.0000 meq | EXTENDED_RELEASE_TABLET | ORAL | Status: AC
Start: 2021-02-10 — End: 2021-02-10
  Administered 2021-02-10 (×2): 40 meq via ORAL
  Filled 2021-02-10 (×2): qty 2

## 2021-02-10 MED ORDER — LORAZEPAM 1 MG PO TABS
1.0000 mg | ORAL_TABLET | ORAL | Status: DC | PRN
  Administered 2021-02-10: 1 mg via ORAL
  Filled 2021-02-10: qty 1

## 2021-02-10 MED ORDER — MAGNESIUM SULFATE 4 GM/100ML IV SOLN
4.0000 g | Freq: Once | INTRAVENOUS | Status: AC
  Administered 2021-02-10: 4 g via INTRAVENOUS
  Filled 2021-02-10: qty 100

## 2021-02-10 MED ORDER — LORAZEPAM 2 MG/ML IJ SOLN: 1.0000 mg | INTRAMUSCULAR | Status: DC | PRN

## 2021-02-10 NOTE — Progress Notes (Signed)
Progress note  Patient: Larry [name]                PCP: Benita Stabile, MD                    DOB: 1950/09/24            DOA: 02/06/2021 ZYS:[id number]             DOS: 02/10/2021, 5:23 PM  Benita Stabile, MD  Patient coming from:   HOME  I have personally reviewed patient's medical records, in electronic medical records, including:  [place] link, and care everywhere.    Subjective:   -More cooperative, less anxious, still disoriented History of present illness:    Larry [name] is a 70 y.o. male with medical history significant of hypertension, chronic alcohol use, hypothyroidism, COPD, presented to the ED intoxicated... Subsequently upon evaluation in ED patient started having tremors, falls....  Procollagen initiated, requested admission for inpatient detox. Admitted with drinking half quart day,... Likely more, for many years, last drink last night  Patient Denies having: Fever, Chills, Cough, SOB, Chest Pain, Abd pain, N/V/D, headache, dizziness, lightheadedness,  Dysuria, Joint pain, rash, open wounds  ED Course:   Vitals:   02/10/21 0800 02/10/21 1318  BP: (!) 157/81 129/79  Pulse: 60 63  Resp:  20  Temp:  97.8 F (36.6 C)  SpO2:  98%   Abnormal labs; hemoglobin 11.7, sodium 140, potassium 3.9, within normal notes exception of calcium 8.1, albumin 2.9, LFTs otherwise within normal limits Alcohol level 360, 54 SARS-CoV-2 negative  Assessment / Plan:    Principal Problem:   Alcohol withdrawal (HCC) Active Problems:   Falls frequently   Principal Problem: Alcohol intoxication/alcohol withdrawal (HCC) -DT symptoms improving slowly less anxious, -c/n scheduled Librium - continue lorazepam per CIWA protocol along with folic acid and thiamine -Unsteady gait, generalized weaknesses -Upon arrival Alcohol level 360,  -Extensive history of alcohol abuse,    Status post frequent falls --Alcohol intoxication is contributory -PT OT recommends SNF  rehab  Hypertension --Accelerated hypertension -Continue home medication of HCTZ and lisinopril, adding metoprolol 25 twice daily today -Continue metoprolol 25 mg twice daily, lisinopril HCTZ 10/12.5 -As needed hydralazine   History of COPD -stable, continue bronchodilators as needed     Per record ?? history of hypothyroidism -TSH normal at 2.65 - -Does not appear to be on thyroid medication PTA will not start at this time  Disposition--- anticipate discharge to SNF rehab versus home with home health after resolution of DTs currently with full-blown DTs requiring p.o. Librium and IV lorazepam    Cultures:  -none  Antimicrobial: -none    Consults called:  None -------------------------------------------------------------------------------------------------------------------------------------------- DVT prophylaxis: SCD/Compression stockings and Heparin SQ Code Status:   Code Status: Full Code     Admission status: Level of care: Med-Surg     Family Communication:  none at bedside  (The above findings and plan of care has been discussed with patient in detail, the patient expressed understanding and agreement of above plan)   --------------------------------------------------------------------------------------------------------------------------------  PRN MEDs: sodium chloride, acetaminophen **OR** acetaminophen, bisacodyl, hydrALAZINE, HYDROmorphone (DILAUDID) injection, ipratropium, levalbuterol, LORazepam **OR** LORazepam, ondansetron **OR** ondansetron (ZOFRAN) IV, oxyCODONE, senna-docusate, sodium chloride flush, traZODone reports that he has been smoking cigarettes. He has been smoking an average of 1 pack per day. He has never used smokeless tobacco. He reports current alcohol use. He reports that he does not use drugs.   Family History  Problem Relation Age of Onset   Thyroid disease Mother    Diabetes Other     Physical Exam:   Blood pressure (!) 157/83,  pulse 83, temperature 97.6 F (36.4 C), temperature source Oral, resp. rate 19, height 6\' 2"  (1.88 m), weight 60.9 kg, SpO2 99 %.   Physical Exam  Gen:- Awake Alert, more cooperative HEENT:- [place].AT, No sclera icterus Neck-Supple Neck,No JVD,.  Lungs-  CTAB , fair air movement CV- S1, S2 normal, regular Abd-  +ve B.Sounds, Abd Soft, No tenderness,    Extremity/Skin:- No  edema,   good pedal pulses Psych-affect is anxious, disoriented,  DTs  neuro-no new focal deficits, significant tremors   Labs on admission:    I have personally reviewed following labs and imaging studies  CBC: Recent Labs  Lab 02/06/21 0142 02/06/21 1327 02/07/21 0450 02/08/21 0602 02/09/21 0624 02/10/21 0534  WBC 5.1 5.6 5.1 4.2 4.7 5.0  NEUTROABS 2.5  --   --   --   --   --   HGB 11.7* 11.0* 11.1* 10.7* 11.4* 12.4*  HCT 34.0* 32.0* 32.8* 31.9* 34.4* 36.3*  MCV 104.3* 103.6* 102.8* 102.2* 103.9* 101.1*  PLT 303 275 266 225 191 193   Basic Metabolic Panel: Recent Labs  Lab 02/06/21 0142 02/06/21 1327 02/07/21 0450 02/08/21 0602 02/09/21 0624 02/10/21 0534  NA 140  --  136 136 136 135  K 3.9  --  3.8 3.4* 3.6 3.1*  CL 104  --  104 103 105 104  CO2 27  --  26 26 25 25   GLUCOSE 84  --  85 93 91 97  BUN 9  --  12 8 7* 6*  CREATININE 0.68 0.68 0.67 0.62 0.62 0.54*  CALCIUM 8.1*  --  8.1* 8.1* 8.0* 8.2*  MG  --   --   --   --   --  1.5*  PHOS  --   --   --   --   --  3.4   GFR: Estimated Creatinine Clearance: 78.3 mL/min (A) (by C-G formula based on SCr of 0.54 mg/dL (L)). Liver Function Tests: Recent Labs  Lab 02/06/21 0142 02/10/21 0534  AST 41  --   ALT 17  --   ALKPHOS 94  --   BILITOT 0.3  --   PROT 6.2*  --   ALBUMIN 2.9* 2.4*   No results for input(s): LIPASE, AMYLASE in the last 168 hours. No results for input(s): AMMONIA in the last 168 hours. Coagulation Profile: Recent Labs  Lab 02/07/21 0450  INR 1.0    CBG: Recent Labs  Lab 02/08/21 0710 02/08/21 1130  02/08/21 1633 02/09/21 0805 02/10/21 0730  GLUCAP 85 139* 106* 81 91   Lipid Profile: No results for input(s): CHOL, HDL, LDLCALC, TRIG, CHOLHDL, LDLDIRECT in the last 72 hours. Thyroid Function Tests: No results for input(s): TSH, T4TOTAL, FREET4, T3FREE, THYROIDAB in the last 72 hours.  Anemia Panel: No results for input(s): VITAMINB12, FOLATE, FERRITIN, TIBC, IRON, RETICCTPCT in the last 72 hours. Urine analysis:    Component Value Date/Time   COLORURINE YELLOW 02/28/2018 1244   APPEARANCEUR HAZY (A) 02/28/2018 1244   LABSPEC 1.021 02/28/2018 1244   PHURINE 5.0 02/28/2018 1244   GLUCOSEU NEGATIVE 02/28/2018 1244   HGBUR NEGATIVE 02/28/2018 1244   BILIRUBINUR NEGATIVE 02/28/2018 1244   KETONESUR 20 (A) 02/28/2018 1244   PROTEINUR NEGATIVE 02/28/2018 1244   NITRITE NEGATIVE 02/28/2018 1244   LEUKOCYTESUR NEGATIVE 02/28/2018 1244  Radiologic Exams on Admission:   No results found.  EKG:   Independently reviewed.  Orders placed or performed during the hospital encounter of 02/06/21   ED EKG   ED EKG   EKG 12-Lead    SIGNED: Shon Hale, MD, [facility],  Pager (Please use [email or web address] to page to text)  If 7PM-7AM, please contact night-coverage [email or web address],  02/10/2021, 5:23 PM

## 2021-02-11 DIAGNOSIS — F10931 Alcohol use, unspecified with withdrawal delirium: Secondary | ICD-10-CM | POA: Diagnosis not present

## 2021-02-11 DIAGNOSIS — I10 Essential (primary) hypertension: Secondary | ICD-10-CM | POA: Diagnosis not present

## 2021-02-11 LAB — CBC
HCT: 35.3 % — ABNORMAL LOW (ref 39.0–52.0)
Hemoglobin: 11.8 g/dL — ABNORMAL LOW (ref 13.0–17.0)
MCH: 34.5 pg — ABNORMAL HIGH (ref 26.0–34.0)
MCHC: 33.4 g/dL (ref 30.0–36.0)
MCV: 103.2 fL — ABNORMAL HIGH (ref 80.0–100.0)
Platelets: 183 10*3/uL (ref 150–400)
RBC: 3.42 MIL/uL — ABNORMAL LOW (ref 4.22–5.81)
RDW: 13.9 % (ref 11.5–15.5)
WBC: 5.3 10*3/uL (ref 4.0–10.5)
nRBC: 0 % (ref 0.0–0.2)

## 2021-02-11 LAB — BASIC METABOLIC PANEL
Anion gap: 4 — ABNORMAL LOW (ref 5–15)
BUN: 10 mg/dL (ref 8–23)
CO2: 28 mmol/L (ref 22–32)
Calcium: 8.2 mg/dL — ABNORMAL LOW (ref 8.9–10.3)
Chloride: 106 mmol/L (ref 98–111)
Creatinine, Ser: 0.61 mg/dL (ref 0.61–1.24)
GFR, Estimated: >60 mL/min (ref >60)
Glucose, Bld: 110 mg/dL — ABNORMAL HIGH (ref 70–99)
Potassium: 3.8 mmol/L (ref 3.5–5.1)
Sodium: 138 mmol/L (ref 135–145)

## 2021-02-11 LAB — GLUCOSE, CAPILLARY: Glucose-Capillary: 98 mg/dL (ref 70–99)

## 2021-02-11 NOTE — TOC Initial Note (Addendum)
Transition of Care [facility]) - Initial/Assessment Note    Patient Details  Name: Larry [name] MRN: [id number] Date of Birth: 02-23-1951  Transition of Care [facility]) CM/SW Contact:    Leitha Bleak, RN Phone Number: 02/11/2021, 1:13 PM  Clinical Narrative:    Patient admitted with alcohol withdrawal. TOC at the bedside, to discuss PT recommendation of SNF. Patient is willing to go, however concerned about his dog and 6 cats in the trailer. He states he called animal control 2 days ago. TOC called Animal control to confirm. Waiting for a call back.  Patient lives alone in a trailer, uses a cab for transportation. States he wife does not speak to him. He has two son that lives out of town, has not spoken in 8 years.   TOC will sent out FL2 for bed offers.               Addendum: Animal control is on the seen, they will take animals and lock door to the trailer as it was unlocked.  Darl Pikes with humane society will follow up with Animal control.   Expected Discharge Plan: Skilled Nursing Facility Barriers to Discharge: Continued Medical Work up   Patient Goals and CMS Choice Patient states their goals for this hospitalization and ongoing recovery are:: to return home. CMS Medicare.gov Compare Post Acute Care list provided to:: Patient Choice offered to / list presented to : Patient  Expected Discharge Plan and Services Expected Discharge Plan: Skilled Nursing Facility      Living arrangements for the past 2 months: Mobile Home Expected Discharge Date: 02/11/21                   Prior Living Arrangements/Services Living arrangements for the past 2 months: Mobile Home Lives with:: Self Patient language and need for interpreter reviewed:: Yes        Need for Family Participation in Patient Care: Yes (Comment) Care giver support system in place?: Yes (comment)   Criminal Activity/Legal Involvement Pertinent to Current Situation/Hospitalization: No - Comment as needed  Activities of  Daily Living Home Assistive Devices/Equipment: None ADL Screening (condition at time of admission) Patient's cognitive ability adequate to safely complete daily activities?: Yes Is the patient deaf or have difficulty hearing?: No Does the patient have difficulty seeing, even when wearing glasses/contacts?: No Does the patient have difficulty concentrating, remembering, or making decisions?: No Patient able to express need for assistance with ADLs?: Yes Does the patient have difficulty dressing or bathing?: Yes Independently performs ADLs?: No Communication: Independent Dressing (OT): Needs assistance Is this a change from baseline?: Change from baseline, expected to last <3days Grooming: Needs assistance Is this a change from baseline?: Change from baseline, expected to last <3 days Feeding: Independent Bathing: Needs assistance Is this a change from baseline?: Change from baseline, expected to last <3 days Toileting: Needs assistance Is this a change from baseline?: Change from baseline, expected to last <3 days In/Out Bed: Needs assistance Is this a change from baseline?: Change from baseline, expected to last <3 days Walks in Home: Needs assistance Is this a change from baseline?: Pre-admission baseline Does the patient have difficulty walking or climbing stairs?: Yes Weakness of Legs: Both Weakness of Arms/Hands: None  Permission Sought/Granted      Emotional Assessment      Orientation: : Oriented to Self, Oriented to Place, Oriented to  Time, Oriented to Situation Alcohol / Substance Use: Not Applicable Psych Involvement: No (comment)  Admission diagnosis:  Alcohol  withdrawal (HCC) [F10.939] Fall, initial encounter [W19.XXXA] Alcohol withdrawal syndrome with complication (HCC) [F10.939] Acute alcoholic intoxication without complication (HCC) [F10.920] Patient Active Problem List   Diagnosis Date Noted   Alcohol withdrawal (HCC) 02/06/2021   Falls frequently  02/06/2021   HTN (hypertension) 02/06/2021   Proximal humerus fracture 09/24/2014   PCP:  Benita Stabile, MD Pharmacy:   [facility] [place], [place] - [address] [place] [address] Phone: [phone] Fax: [phone]     Social Determinants of Health (SDOH) Interventions    Readmission Risk Interventions Readmission Risk Prevention Plan 02/11/2021  Transportation Screening Complete  Home Care Screening Complete  Medication Review (RN CM) Complete  Some recent data might be hidden

## 2021-02-11 NOTE — Care Management Important Message (Signed)
Important Message  Patient Details  Name: Larry [name] MRN: [id number] Date of Birth: Mar 06, 1951   Medicare Important Message Given:  Yes     Corey Harold 02/11/2021, 11:05 AM

## 2021-02-11 NOTE — Progress Notes (Addendum)
Progress note  Patient: Larry [name]                PCP: Benita Stabile, MD                    DOB: 01-Nov-1950            DOA: 02/06/2021 [id number]             DOS: 02/11/2021, 3:57 PM  Benita Stabile, MD  Patient coming from:   HOME  I have personally reviewed patient's medical records, in electronic medical records, including:  [place] link, and care everywhere.    Subjective:   -More cooperative, DTs are resolving -More talkative -Oral intake is fair History of present illness:    Larry [name] is a 70 y.o. male with medical history significant of hypertension, chronic alcohol use, hypothyroidism, COPD, presented to the ED intoxicated... Subsequently upon evaluation in ED patient started having tremors, falls....  Procollagen initiated, requested admission for inpatient detox. Admitted with drinking half quart day,... Likely more, for many years, last drink last night  Patient Denies having: Fever, Chills, Cough, SOB, Chest Pain, Abd pain, N/V/D, headache, dizziness, lightheadedness,  Dysuria, Joint pain, rash, open wounds  ED Course:   Vitals:   02/11/21 0534 02/11/21 1416  BP: (!) 146/87 132/71  Pulse: 78 62  Resp:  17  Temp: 98.4 F (36.9 C) 98.2 F (36.8 C)  SpO2: 97% 97%   Abnormal labs; hemoglobin 11.7, sodium 140, potassium 3.9, within normal notes exception of calcium 8.1, albumin 2.9, LFTs otherwise within normal limits Alcohol level 360, 54 SARS-CoV-2 negative  Assessment / Plan:    Principal Problem:   Alcohol withdrawal (HCC) Active Problems:   Falls frequently   Principal Problem: Alcohol intoxication/alcohol withdrawal (HCC) -DTs are resolving -Can stop scheduled Librium over the next 24 hours or so - continue lorazepam per CIWA protocol along with folic acid and thiamine -Unsteady gait, generalized weaknesses -Upon arrival Alcohol level 360,  -Extensive history of alcohol abuse,    Status post frequent falls --Alcohol intoxication  is contributory -PT OT recommends SNF rehab  Hypertension --Accelerated hypertension -Continue home medication of HCTZ and lisinopril, adding metoprolol 25 twice daily today -Continue metoprolol 25 mg twice daily, lisinopril HCTZ 10/12.5 -As needed hydralazine   History of COPD -stable, continue bronchodilators as needed     Per record ?? history of hypothyroidism -TSH normal at 2.65 - -Does not appear to be on thyroid medication PTA will not start at this time  Disposition--- anticipate discharge to SNF-DTs are resolving    Cultures:  -none  Antimicrobial: -none    Consults called:  None -------------------------------------------------------------------------------------------------------------------------------------------- DVT prophylaxis: SCD/Compression stockings and Heparin SQ Code Status:   Code Status: Full Code     Admission status: Level of care: Med-Surg     Family Communication:  none at bedside  (The above findings and plan of care has been discussed with patient in detail, the patient expressed understanding and agreement of above plan)   --------------------------------------------------------------------------------------------------------------------------------  PRN MEDs: sodium chloride, acetaminophen **OR** acetaminophen, bisacodyl, hydrALAZINE, HYDROmorphone (DILAUDID) injection, ipratropium, levalbuterol, LORazepam **OR** LORazepam, ondansetron **OR** ondansetron (ZOFRAN) IV, oxyCODONE, senna-docusate, sodium chloride flush, traZODone reports that he has been smoking cigarettes. He has been smoking an average of 1 pack per day. He has never used smokeless tobacco. He reports current alcohol use. He reports that he does not use drugs.   Family History  Problem Relation Age of  Onset   Thyroid disease Mother    Diabetes Other     Physical Exam:   Blood pressure (!) 157/83, pulse 83, temperature 97.6 F (36.4 C), temperature source Oral, resp. rate  19, height 6\' 2"  (1.88 m), weight 60.9 kg, SpO2 99 %.   Physical Exam  Gen:- Awake Alert, in no apparent distress  HEENT:- [place].AT, No sclera icterus Neck-Supple Neck,No JVD,.  Lungs-  CTAB , fair air movement CV- S1, S2 normal, regular Abd-  +ve B.Sounds, Abd Soft, No tenderness,    Extremity/Skin:- No  edema,   good pedal pulses Psych-affect is appropriate, alert and oriented x3, cooperative, neuro-generalized weakness, no new focal deficits, much improved tremors   Labs on admission:    I have personally reviewed following labs and imaging studies  CBC: Recent Labs  Lab 02/06/21 0142 02/06/21 1327 02/07/21 0450 02/08/21 0602 02/09/21 0624 02/10/21 0534 02/11/21 0611  WBC 5.1   < > 5.1 4.2 4.7 5.0 5.3  NEUTROABS 2.5  --   --   --   --   --   --   HGB 11.7*   < > 11.1* 10.7* 11.4* 12.4* 11.8*  HCT 34.0*   < > 32.8* 31.9* 34.4* 36.3* 35.3*  MCV 104.3*   < > 102.8* 102.2* 103.9* 101.1* 103.2*  PLT 303   < > 266 225 191 193 183   < > = values in this interval not displayed.   Basic Metabolic Panel: Recent Labs  Lab 02/07/21 0450 02/08/21 0602 02/09/21 0624 02/10/21 0534 02/11/21 0611  NA 136 136 136 135 138  K 3.8 3.4* 3.6 3.1* 3.8  CL 104 103 105 104 106  CO2 26 26 25 25 28   GLUCOSE 85 93 91 97 110*  BUN 12 8 7* 6* 10  CREATININE 0.67 0.62 0.62 0.54* 0.61  CALCIUM 8.1* 8.1* 8.0* 8.2* 8.2*  MG  --   --   --  1.5*  --   PHOS  --   --   --  3.4  --    GFR: Estimated Creatinine Clearance: 74.7 mL/min (by C-G formula based on SCr of 0.61 mg/dL). Liver Function Tests: Recent Labs  Lab 02/06/21 0142 02/10/21 0534  AST 41  --   ALT 17  --   ALKPHOS 94  --   BILITOT 0.3  --   PROT 6.2*  --   ALBUMIN 2.9* 2.4*   No results for input(s): LIPASE, AMYLASE in the last 168 hours. No results for input(s): AMMONIA in the last 168 hours. Coagulation Profile: Recent Labs  Lab 02/07/21 0450  INR 1.0    CBG: Recent Labs  Lab 02/08/21 1130 02/08/21 1633  02/09/21 0805 02/10/21 0730 02/11/21 0725  GLUCAP 139* 106* 81 91 98   Lipid Profile: No results for input(s): CHOL, HDL, LDLCALC, TRIG, CHOLHDL, LDLDIRECT in the last 72 hours. Thyroid Function Tests: No results for input(s): TSH, T4TOTAL, FREET4, T3FREE, THYROIDAB in the last 72 hours.  Anemia Panel: No results for input(s): VITAMINB12, FOLATE, FERRITIN, TIBC, IRON, RETICCTPCT in the last 72 hours. Urine analysis:    Component Value Date/Time   COLORURINE YELLOW 02/28/2018 1244   APPEARANCEUR HAZY (A) 02/28/2018 1244   LABSPEC 1.021 02/28/2018 1244   PHURINE 5.0 02/28/2018 1244   GLUCOSEU NEGATIVE 02/28/2018 1244   HGBUR NEGATIVE 02/28/2018 1244   BILIRUBINUR NEGATIVE 02/28/2018 1244   KETONESUR 20 (A) 02/28/2018 1244   PROTEINUR NEGATIVE 02/28/2018 1244   NITRITE NEGATIVE 02/28/2018 1244  LEUKOCYTESUR NEGATIVE 02/28/2018 1244    Radiologic Exams on Admission:   No results found.  EKG:   Independently reviewed.  Orders placed or performed during the hospital encounter of 02/06/21   ED EKG   ED EKG   EKG 12-Lead    SIGNED: Shon Hale, MD, [facility],  Pager (Please use [email or web address] to page to text)  If 7PM-7AM, please contact night-coverage [email or web address],  02/11/2021, 3:57 PM

## 2021-02-11 NOTE — NC FL2 (Signed)
[place] MEDICAID FL2 LEVEL OF CARE SCREENING TOOL     IDENTIFICATION  Patient Name: Larry [name] Birthdate: 1950-11-02 Sex: male Admission Date (Current Location): 02/06/2021  [facility] and [place] Number:  [employer] and Address:  [facility],  [address], [place] [address]      Provider Number: [id number]  Attending Physician Name and Address:  Shon Hale, MD  Relative Name and Phone Number:       Current Level of Care: Hospital Recommended Level of Care: Skilled Nursing Facility Prior Approval Number:    Date Approved/Denied:   PASRR Number: [id number]  Discharge Plan: SNF    Current Diagnoses: Patient Active Problem List   Diagnosis Date Noted   Alcohol withdrawal (HCC) 02/06/2021   Falls frequently 02/06/2021   HTN (hypertension) 02/06/2021   Proximal humerus fracture 09/24/2014    Orientation RESPIRATION BLADDER Height & Weight     Self, Time, Situation, Place  Normal External catheter Weight: 61.5 kg Height:  6\' 2"  (188 cm)  BEHAVIORAL SYMPTOMS/MOOD NEUROLOGICAL BOWEL NUTRITION STATUS      Continent Diet (See DC summary)  AMBULATORY STATUS COMMUNICATION OF NEEDS Skin   Extensive Assist Verbally Normal                       Personal Care Assistance Level of Assistance  Bathing, Dressing, Feeding Bathing Assistance: Maximum assistance Feeding assistance: Limited assistance Dressing Assistance: Maximum assistance     Functional Limitations Info  Sight, Hearing, Speech Sight Info: Impaired Hearing Info: Impaired Speech Info: Impaired (slow/quiet)    SPECIAL CARE FACTORS FREQUENCY  PT (By licensed PT)     PT Frequency: 5 time a week              Contractures Contractures Info: Not present    Additional Factors Info  Code Status, Allergies Code Status Info: FULL Allergies Info: NKDA           Current Medications (02/11/2021):  This is the current hospital active medication list Current  Facility-Administered Medications  Medication Dose Route Frequency Provider Last Rate Last Admin   0.9 %  sodium chloride infusion   Intravenous Continuous 02/13/2021, MD 100 mL/hr at 02/11/21 1156 Infusion Verify at 02/11/21 1156   0.9 %  sodium chloride infusion  250 mL Intravenous PRN Shahmehdi, Seyed A, MD       acetaminophen (TYLENOL) tablet 650 mg  650 mg Oral Q6H PRN Shahmehdi, Seyed A, MD       Or   acetaminophen (TYLENOL) suppository 650 mg  650 mg Rectal Q6H PRN Shahmehdi, Seyed A, MD       bacitracin-polymyxin b (POLYSPORIN) ophthalmic ointment   Both Eyes TID 02/13/21, MD   Given at 02/11/21 0909   bisacodyl (DULCOLAX) EC tablet 5 mg  5 mg Oral Daily PRN Shahmehdi, Seyed A, MD       chlordiazePOXIDE (LIBRIUM) capsule 10 mg  10 mg Oral QID Emokpae, Courage, MD   10 mg at 02/11/21 0907   heparin injection 5,000 Units  5,000 Units Subcutaneous Q8H Shahmehdi, Seyed A, MD   5,000 Units at 02/11/21 0531   hydrALAZINE (APRESOLINE) injection 10 mg  10 mg Intravenous Q4H PRN Shahmehdi, Seyed A, MD       lisinopril (ZESTRIL) tablet 10 mg  10 mg Oral Daily Shahmehdi, Seyed A, MD   10 mg at 02/11/21 0908   And   hydrochlorothiazide (MICROZIDE) capsule 12.5 mg  12.5  mg Oral Daily Shahmehdi, Seyed A, MD   12.5 mg at 02/11/21 5732   HYDROmorphone (DILAUDID) injection 0.5-1 mg  0.5-1 mg Intravenous Q2H PRN Shahmehdi, Seyed A, MD       ipratropium (ATROVENT) nebulizer solution 0.5 mg  0.5 mg Nebulization Q6H PRN Shahmehdi, Seyed A, MD       levalbuterol (XOPENEX) nebulizer solution 0.63 mg  0.63 mg Nebulization Q6H PRN Shahmehdi, Seyed A, MD       LORazepam (ATIVAN) tablet 1-4 mg  1-4 mg Oral Q1H PRN Shon Hale, MD   1 mg at 02/10/21 2135   Or   LORazepam (ATIVAN) injection 1-4 mg  1-4 mg Intravenous Q1H PRN Shon Hale, MD       metoprolol tartrate (LOPRESSOR) tablet 25 mg  25 mg Oral BID Shahmehdi, Seyed A, MD   25 mg at 02/11/21 0908   ondansetron (ZOFRAN) tablet 4 mg   4 mg Oral Q6H PRN Shahmehdi, Seyed A, MD       Or   ondansetron (ZOFRAN) injection 4 mg  4 mg Intravenous Q6H PRN Shahmehdi, Seyed A, MD       oxyCODONE (Oxy IR/ROXICODONE) immediate release tablet 5 mg  5 mg Oral Q4H PRN Shahmehdi, Seyed A, MD   5 mg at 02/08/21 0413   senna-docusate (Senokot-S) tablet 1 tablet  1 tablet Oral QHS PRN Shahmehdi, Seyed A, MD       sodium chloride flush (NS) 0.9 % injection 3 mL  3 mL Intravenous Q12H Shahmehdi, Seyed A, MD   3 mL at 02/11/21 0910   sodium chloride flush (NS) 0.9 % injection 3 mL  3 mL Intravenous Q12H Shahmehdi, Seyed A, MD   3 mL at 02/11/21 0911   sodium chloride flush (NS) 0.9 % injection 3 mL  3 mL Intravenous PRN Shahmehdi, Seyed A, MD       thiamine tablet 100 mg  100 mg Oral Daily Vanetta Mulders, MD   100 mg at 02/11/21 2025   Or   thiamine (B-1) injection 100 mg  100 mg Intravenous Daily Vanetta Mulders, MD   100 mg at 02/09/21 1040   traZODone (DESYREL) tablet 25 mg  25 mg Oral QHS PRN Kendell Bane, MD   25 mg at 02/10/21 2135     Discharge Medications: Please see discharge summary for a list of discharge medications.  Relevant Imaging Results:  Relevant Lab Results:   Additional Information SS# [id number]  Leitha Bleak, RN

## 2021-02-12 DIAGNOSIS — F10931 Alcohol use, unspecified with withdrawal delirium: Secondary | ICD-10-CM | POA: Diagnosis not present

## 2021-02-12 DIAGNOSIS — I10 Essential (primary) hypertension: Secondary | ICD-10-CM | POA: Diagnosis not present

## 2021-02-12 LAB — GLUCOSE, CAPILLARY: Glucose-Capillary: 203 mg/dL — ABNORMAL HIGH (ref 70–99)

## 2021-02-12 MED ORDER — LORAZEPAM 2 MG/ML IJ SOLN: 1.0000 mg | Freq: Four times a day (QID) | INTRAMUSCULAR | Status: DC | PRN

## 2021-02-12 NOTE — Progress Notes (Signed)
Progress note  Patient: Larry [name]                PCP: Benita Stabile, MD                    DOB: 20-Jan-1951            DOA: 02/06/2021 [id number]             DOS: 02/12/2021, 12:42 PM  Benita Stabile, MD  Patient coming from:   HOME  I have personally reviewed patient's medical records, in electronic medical records, including:  [place] link, and care everywhere.    Subjective:   -More cooperative, -Eating and drinking well, no new concerns -Remains weak overall History of present illness:    Larry [name] is a 70 y.o. male with medical history significant of hypertension, chronic alcohol use, hypothyroidism, COPD, presented to the ED intoxicated... Subsequently upon evaluation in ED patient started having tremors, falls....  Procollagen initiated, requested admission for inpatient detox. Admitted with drinking half quart day,... Likely more, for many years, last drink last night  Patient Denies having: Fever, Chills, Cough, SOB, Chest Pain, Abd pain, N/V/D, headache, dizziness, lightheadedness,  Dysuria, Joint pain, rash, open wounds  ED Course:   Vitals:   02/12/21 0629 02/12/21 0901  BP: (!) 149/79 131/85  Pulse: 79 81  Resp: 18   Temp: 98 F (36.7 C)   SpO2: 95%    Abnormal labs; hemoglobin 11.7, sodium 140, potassium 3.9, within normal notes exception of calcium 8.1, albumin 2.9, LFTs otherwise within normal limits Alcohol level 360, 54 SARS-CoV-2 negative  Assessment / Plan:    Principal Problem:   Alcohol withdrawal (HCC) Active Problems:   Falls frequently  NB- - delirium tremens pathophysiology has resolved, patient is medically stable for discharge, [facility]21 if we have insurance approval   Principal Problem: Alcohol intoxication/alcohol withdrawal (HCC) -DTs are resolved -Discontinue scheduled Librium on 02/12/2021 - continue lorazepam prn along with folic acid and thiamine -Unsteady gait, generalized  weaknesses -Upon arrival Alcohol level 360,  -Extensive history of alcohol abuse,    Status post frequent falls/generalized weakness and ambulatory dysfunction --Alcohol intoxication is contributory -PT OT recommends SNF rehab  Hypertension --Accelerated hypertension -Continue home medication of HCTZ and lisinopril, adding metoprolol 25 twice daily today -Continue metoprolol 25 mg twice daily, lisinopril HCTZ 10/12.5 -As needed hydralazine   History of COPD -stable, continue bronchodilators as needed     Per record ?? history of hypothyroidism -TSH normal at 2.65 - -Does not appear to be on thyroid medication PTA will not start at this time  Disposition--- delirium tremens pathophysiology has resolved, patient is medically stable for discharge, anticipate discharge to [facility] SNF-on 02/14/21 if we have insurance approval    Cultures:  -none  Antimicrobial: -none    Consults called:  None -------------------------------------------------------------------------------------------------------------------------------------------- DVT prophylaxis: SCD/Compression stockings and Heparin SQ Code Status:   Code Status: Full Code     Admission status: Level of care: Med-Surg     Family Communication:  none at bedside  (The above findings and plan of care has been discussed with patient in detail, the patient expressed understanding and agreement of above plan)   --------------------------------------------------------------------------------------------------------------------------------  PRN MEDs: sodium chloride, acetaminophen **OR** acetaminophen, bisacodyl, hydrALAZINE, HYDROmorphone (DILAUDID) injection, ipratropium, levalbuterol, LORazepam, ondansetron **OR** ondansetron (ZOFRAN) IV, oxyCODONE, senna-docusate, sodium chloride flush, traZODone reports that he has been smoking cigarettes. He has been smoking an average  of 1 pack per day. He has never used smokeless tobacco. He  reports current alcohol use. He reports that he does not use drugs.   Family History  Problem Relation Age of Onset   Thyroid disease Mother    Diabetes Other     Physical Exam:   Blood pressure 131/85, pulse 81, temperature 98 F (36.7 C), temperature source Oral, resp. rate 18, height 6\' 2"  (1.88 m), weight 69.5 kg, SpO2 95 %.   Physical Exam  Gen:- Awake Alert, in no apparent distress  HEENT:- [place].AT, No sclera icterus Neck-Supple Neck,No JVD,.  Lungs-  CTAB , fair air movement CV- S1, S2 normal, regular Abd-  +ve B.Sounds, Abd Soft, No tenderness,    Extremity/Skin:- No  edema,   good pedal pulses Psych-affect is appropriate, alert and oriented x3, cooperative, neuro-generalized weakness, no new focal deficits, no significant tremors  Labs on admission:    I have personally reviewed following labs and imaging studies  CBC: Recent Labs  Lab 02/06/21 0142 02/06/21 1327 02/07/21 0450 02/08/21 0602 02/09/21 0624 02/10/21 0534 02/11/21 0611  WBC 5.1   < > 5.1 4.2 4.7 5.0 5.3  NEUTROABS 2.5  --   --   --   --   --   --   HGB 11.7*   < > 11.1* 10.7* 11.4* 12.4* 11.8*  HCT 34.0*   < > 32.8* 31.9* 34.4* 36.3* 35.3*  MCV 104.3*   < > 102.8* 102.2* 103.9* 101.1* 103.2*  PLT 303   < > 266 225 191 193 183   < > = values in this interval not displayed.   Basic Metabolic Panel: Recent Labs  Lab 02/07/21 0450 02/08/21 0602 02/09/21 0624 02/10/21 0534 02/11/21 0611  NA 136 136 136 135 138  K 3.8 3.4* 3.6 3.1* 3.8  CL 104 103 105 104 106  CO2 26 26 25 25 28   GLUCOSE 85 93 91 97 110*  BUN 12 8 7* 6* 10  CREATININE 0.67 0.62 0.62 0.54* 0.61  CALCIUM 8.1* 8.1* 8.0* 8.2* 8.2*  MG  --   --   --  1.5*  --   PHOS  --   --   --  3.4  --    GFR: Estimated Creatinine Clearance: 84.5 mL/min (by C-G formula based on SCr of 0.61 mg/dL). Liver Function Tests: Recent Labs  Lab 02/06/21 0142 02/10/21 0534  AST 41  --   ALT 17  --   ALKPHOS 94  --   BILITOT 0.3  --    PROT 6.2*  --   ALBUMIN 2.9* 2.4*   No results for input(s): LIPASE, AMYLASE in the last 168 hours. No results for input(s): AMMONIA in the last 168 hours. Coagulation Profile: Recent Labs  Lab 02/07/21 0450  INR 1.0    CBG: Recent Labs  Lab 02/08/21 1633 02/09/21 0805 02/10/21 0730 02/11/21 0725 02/12/21 0710  GLUCAP 106* 81 91 98 203*   Lipid Profile: No results for input(s): CHOL, HDL, LDLCALC, TRIG, CHOLHDL, LDLDIRECT in the last 72 hours. Thyroid Function Tests: No results for input(s): TSH, T4TOTAL, FREET4, T3FREE, THYROIDAB in the last 72 hours.  Anemia Panel: No results for input(s): VITAMINB12, FOLATE, FERRITIN, TIBC, IRON, RETICCTPCT in the last 72 hours. Urine analysis:    Component Value Date/Time   COLORURINE YELLOW 02/28/2018 1244   APPEARANCEUR HAZY (A) 02/28/2018 1244   LABSPEC 1.021 02/28/2018 1244   PHURINE 5.0 02/28/2018 1244   GLUCOSEU NEGATIVE 02/28/2018 1244  HGBUR NEGATIVE 02/28/2018 1244   BILIRUBINUR NEGATIVE 02/28/2018 1244   KETONESUR 20 (A) 02/28/2018 1244   PROTEINUR NEGATIVE 02/28/2018 1244   NITRITE NEGATIVE 02/28/2018 1244   LEUKOCYTESUR NEGATIVE 02/28/2018 1244    Radiologic Exams on Admission:   No results found.  EKG:   Independently reviewed.  Orders placed or performed during the hospital encounter of 02/06/21   ED EKG   ED EKG   EKG 12-Lead    SIGNED: Shon Hale, MD, [facility],  Pager (Please use [email or web address] to page to text)  If 7PM-7AM, please contact night-coverage [email or web address],  02/12/2021, 12:42 PM

## 2021-02-12 NOTE — TOC Progression Note (Signed)
Transition of Care [facility]) - Progression Note    Patient Details  Name: Larry [name] MRN: [id number] Date of Birth: 11/06/1950  Transition [facility] Care [facility]) CM/SW Contact  Leitha Bleak, RN Phone Number: 02/12/2021, 12:16 PM  Clinical Narrative:   Patient accepting the bed offer at [facility].  [employer] is not manaed by [employer], TOC requested Debbie to start INS AUTH.   Expected Discharge Plan: Skilled Nursing Facility Barriers to Discharge: Continued Medical Work up  Expected Discharge Plan and Services Expected Discharge Plan: Skilled Nursing Facility      Living arrangements for the past 2 months: Mobile Home Expected Discharge Date: 02/11/21                    Readmission Risk Interventions Readmission Risk Prevention Plan 02/11/2021  Transportation Screening Complete  Home Care Screening Complete  Medication Review (RN CM) Complete  Some recent data might be hidden

## 2021-02-13 DIAGNOSIS — R296 Repeated falls: Secondary | ICD-10-CM | POA: Diagnosis not present

## 2021-02-13 DIAGNOSIS — I10 Essential (primary) hypertension: Secondary | ICD-10-CM | POA: Diagnosis not present

## 2021-02-13 DIAGNOSIS — F10939 Alcohol use, unspecified with withdrawal, unspecified: Secondary | ICD-10-CM | POA: Diagnosis not present

## 2021-02-13 LAB — BASIC METABOLIC PANEL
Anion gap: 6 (ref 5–15)
BUN: 10 mg/dL (ref 8–23)
CO2: 26 mmol/L (ref 22–32)
Calcium: 8.2 mg/dL — ABNORMAL LOW (ref 8.9–10.3)
Chloride: 104 mmol/L (ref 98–111)
Creatinine, Ser: 0.66 mg/dL (ref 0.61–1.24)
GFR, Estimated: >60 mL/min (ref >60)
Glucose, Bld: 95 mg/dL (ref 70–99)
Potassium: 3.6 mmol/L (ref 3.5–5.1)
Sodium: 136 mmol/L (ref 135–145)

## 2021-02-13 LAB — CBC
HCT: 31.6 % — ABNORMAL LOW (ref 39.0–52.0)
Hemoglobin: 10.5 g/dL — ABNORMAL LOW (ref 13.0–17.0)
MCH: 34.5 pg — ABNORMAL HIGH (ref 26.0–34.0)
MCHC: 33.2 g/dL (ref 30.0–36.0)
MCV: 103.9 fL — ABNORMAL HIGH (ref 80.0–100.0)
Platelets: 189 10*3/uL (ref 150–400)
RBC: 3.04 MIL/uL — ABNORMAL LOW (ref 4.22–5.81)
RDW: 13.8 % (ref 11.5–15.5)
WBC: 5.8 10*3/uL (ref 4.0–10.5)
nRBC: 0 % (ref 0.0–0.2)

## 2021-02-13 LAB — GLUCOSE, CAPILLARY: Glucose-Capillary: 91 mg/dL (ref 70–99)

## 2021-02-13 NOTE — Progress Notes (Signed)
Pt lying in bed with eyes open when this LPN came in to give night time meds, Pt Was able to make needs known and asked for PRN traZODone (DESYREL) tablet 25 mg    To help try to rest. Call bell placed in reach. Will continue to monitor.

## 2021-02-13 NOTE — Progress Notes (Signed)
Progress note  Patient: Larry [name]                PCP: Benita Stabile, MD                    DOB: 08-19-1950            DOA: 02/06/2021 [id number]             DOS: 02/13/2021, 5:23 PM  Benita Stabile, MD  Patient coming from:   HOME  I have personally reviewed patient's medical records, in electronic medical records, including:  [place] link, and care everywhere.    Subjective:  Patient is oriented x3, in no acute distress and following commands appropriately.  Medically stable and ready for discharge to skilled nursing facility for further care and rehabilitation.  History of present illness:    Larry [name] is a 70 y.o. male with medical history significant of hypertension, chronic alcohol use, hypothyroidism, COPD, presented to the ED intoxicated... Subsequently upon evaluation in ED patient started having tremors, falls....  Procollagen initiated, requested admission for inpatient detox. Admitted with drinking half quart day,... Likely more, for many years, last drink last night  Patient Denies having: Fever, Chills, Cough, SOB, Chest Pain, Abd pain, N/V/D, headache, dizziness, lightheadedness,  Dysuria, Joint pain, rash, open wounds  ED Course:   Vitals:   02/13/21 0427 02/13/21 1236  BP: (!) 150/83 134/77  Pulse: 76 71  Resp: 18 16  Temp: 99.1 F (37.3 C) 99 F (37.2 C)  SpO2: 95% 95%   Abnormal labs; hemoglobin 11.7, sodium 140, potassium 3.9, within normal notes exception of calcium 8.1, albumin 2.9, LFTs otherwise within normal limits Alcohol level 360, 54 SARS-CoV-2 negative  Assessment / Plan:    Principal Problem: Alcohol intoxication/alcohol withdrawal (HCC) -DTs are resolved -Discontinue scheduled Librium on 02/12/2021 - continue lorazepam prn along with folic acid and thiamine -Unsteady gait, generalized weaknesses -Upon arrival Alcohol level 360,  -Extensive history of alcohol abuse,    Status post frequent falls/generalized weakness and  ambulatory dysfunction --Alcohol intoxication is contributory -PT/OT recommends SNF rehab -Patient is medically stable for discharge to nursing facility.  Hypertension --Accelerated hypertension -Continue home medication of HCTZ and lisinopril,  metoprolol 25 twice has been added for better blood pressure control. -Continue as needed hydralazine.   History of COPD -stable, continue bronchodilators as needed     Per record ?? history of hypothyroidism -TSH normal at 2.65 - -Does not appear to be on thyroid medication PTA will not start at this time  Disposition--- delirium tremens pathophysiology has resolved, patient is medically stable for discharge, anticipate discharge to [facility] SNF-on 02/14/21 if we unable to receive insurance approval    Cultures:  -none   Antimicrobial: -none    Consults called:  None -------------------------------------------------------------------------------------------------------------------------------------------- DVT prophylaxis: SCD/Compression stockings and Heparin SQ Code Status:   Code Status: Full Code     Admission status: Level of care: Med-Surg     Family Communication:  none at bedside Case discussed in details with all questions answered with patient.  --------------------------------------------------------------------------------------------------------------------------------  PRN MEDs: sodium chloride, acetaminophen **OR** acetaminophen, bisacodyl, hydrALAZINE, HYDROmorphone (DILAUDID) injection, ipratropium, levalbuterol, LORazepam, ondansetron **OR** ondansetron (ZOFRAN) IV, oxyCODONE, senna-docusate, sodium chloride flush, traZODone reports that he has been smoking cigarettes. He has been smoking an average of 1 pack per day. He has never used smokeless tobacco. He reports current alcohol use. He reports that he does not use drugs.   Family  History  Problem Relation Age of Onset   Thyroid disease Mother    Diabetes Other      Physical Exam:   Blood pressure 131/85, pulse 81, temperature 98 F (36.7 C), temperature source Oral, resp. rate 18, height 6\' 2"  (1.88 m), weight 69.5 kg, SpO2 95 %.   Physical Exam General exam: Alert, awake, following commands appropriately cooperative with examination.  No major distress appreciated during today's examination. Respiratory system: Clear to auscultation. Normal respiratory effort.  Good saturation on room air Cardiovascular system:RRR. No murmurs, rubs, gallops.  No JVD. Gastrointestinal system: Abdomen is nondistended, soft and nontender. No organomegaly or masses felt. Normal bowel sounds heard. Central nervous system: Generally weak bilaterally; no new focal deficit.  No significant tremors. Extremities: No cyanosis, clubbing or edema. Skin: No petechiae. Psychiatry: Judgement and insight appear normal. Mood & affect appropriate currently.  Labs on admission:    I have personally reviewed following labs and imaging studies  CBC: Recent Labs  Lab 02/08/21 0602 02/09/21 0624 02/10/21 0534 02/11/21 0611 02/13/21 0505  WBC 4.2 4.7 5.0 5.3 5.8  HGB 10.7* 11.4* 12.4* 11.8* 10.5*  HCT 31.9* 34.4* 36.3* 35.3* 31.6*  MCV 102.2* 103.9* 101.1* 103.2* 103.9*  PLT 225 191 193 183 189   Basic Metabolic Panel: Recent Labs  Lab 02/08/21 0602 02/09/21 0624 02/10/21 0534 02/11/21 0611 02/13/21 0505  NA 136 136 135 138 136  K 3.4* 3.6 3.1* 3.8 3.6  CL 103 105 104 106 104  CO2 26 25 25 28 26   GLUCOSE 93 91 97 110* 95  BUN 8 7* 6* 10 10  CREATININE 0.62 0.62 0.54* 0.61 0.66  CALCIUM 8.1* 8.0* 8.2* 8.2* 8.2*  MG  --   --  1.5*  --   --   PHOS  --   --  3.4  --   --    GFR: Estimated Creatinine Clearance: 80.7 mL/min (by C-G formula based on SCr of 0.66 mg/dL).  Liver Function Tests: Recent Labs  Lab 02/10/21 0534  ALBUMIN 2.4*   Coagulation Profile: Recent Labs  Lab 02/07/21 0450  INR 1.0    CBG: Recent Labs  Lab 02/09/21 0805  02/10/21 0730 02/11/21 0725 02/12/21 0710 02/13/21 0727  GLUCAP 81 91 98 203* 91    Urine analysis:    Component Value Date/Time   COLORURINE YELLOW 02/28/2018 1244   APPEARANCEUR HAZY (A) 02/28/2018 1244   LABSPEC 1.021 02/28/2018 1244   PHURINE 5.0 02/28/2018 1244   GLUCOSEU NEGATIVE 02/28/2018 1244   HGBUR NEGATIVE 02/28/2018 1244   BILIRUBINUR NEGATIVE 02/28/2018 1244   KETONESUR 20 (A) 02/28/2018 1244   PROTEINUR NEGATIVE 02/28/2018 1244   NITRITE NEGATIVE 02/28/2018 1244   LEUKOCYTESUR NEGATIVE 02/28/2018 1244    Radiologic Exams on Admission:   No results found.  EKG:   Independently reviewed.  Orders placed or performed during the hospital encounter of 02/06/21   ED EKG   ED EKG   EKG 12-Lead    SIGNED: 13/10/2017, MD, [facility],  Pager (Please use [email or web address] to page to text)  If 7PM-7AM, please contact night-coverage [email or web address],  02/13/2021, 5:23 PM

## 2021-02-14 DIAGNOSIS — Y908 Blood alcohol level of 240 mg/100 ml or more: Secondary | ICD-10-CM | POA: Diagnosis not present

## 2021-02-14 DIAGNOSIS — M6259 Muscle wasting and atrophy, not elsewhere classified, multiple sites: Secondary | ICD-10-CM | POA: Diagnosis not present

## 2021-02-14 DIAGNOSIS — E876 Hypokalemia: Secondary | ICD-10-CM

## 2021-02-14 DIAGNOSIS — R1312 Dysphagia, oropharyngeal phase: Secondary | ICD-10-CM | POA: Diagnosis not present

## 2021-02-14 DIAGNOSIS — Z7401 Bed confinement status: Secondary | ICD-10-CM | POA: Diagnosis not present

## 2021-02-14 DIAGNOSIS — R2689 Other abnormalities of gait and mobility: Secondary | ICD-10-CM | POA: Diagnosis not present

## 2021-02-14 DIAGNOSIS — F10939 Alcohol use, unspecified with withdrawal, unspecified: Secondary | ICD-10-CM | POA: Diagnosis not present

## 2021-02-14 DIAGNOSIS — R531 Weakness: Secondary | ICD-10-CM | POA: Diagnosis not present

## 2021-02-14 DIAGNOSIS — J449 Chronic obstructive pulmonary disease, unspecified: Secondary | ICD-10-CM | POA: Diagnosis not present

## 2021-02-14 DIAGNOSIS — F1090 Alcohol use, unspecified, uncomplicated: Secondary | ICD-10-CM | POA: Diagnosis not present

## 2021-02-14 DIAGNOSIS — M6281 Muscle weakness (generalized): Secondary | ICD-10-CM | POA: Diagnosis not present

## 2021-02-14 DIAGNOSIS — R296 Repeated falls: Secondary | ICD-10-CM | POA: Diagnosis not present

## 2021-02-14 DIAGNOSIS — E039 Hypothyroidism, unspecified: Secondary | ICD-10-CM | POA: Diagnosis not present

## 2021-02-14 DIAGNOSIS — I10 Essential (primary) hypertension: Secondary | ICD-10-CM | POA: Diagnosis not present

## 2021-02-14 DIAGNOSIS — Z79899 Other long term (current) drug therapy: Secondary | ICD-10-CM | POA: Diagnosis not present

## 2021-02-14 DIAGNOSIS — F10239 Alcohol dependence with withdrawal, unspecified: Secondary | ICD-10-CM | POA: Diagnosis not present

## 2021-02-14 DIAGNOSIS — Z23 Encounter for immunization: Secondary | ICD-10-CM | POA: Diagnosis not present

## 2021-02-14 DIAGNOSIS — R5381 Other malaise: Secondary | ICD-10-CM | POA: Diagnosis not present

## 2021-02-14 LAB — GLUCOSE, CAPILLARY: Glucose-Capillary: 80 mg/dL (ref 70–99)

## 2021-02-14 MED ORDER — METOPROLOL TARTRATE 25 MG PO TABS: 25.0000 mg | ORAL_TABLET | Freq: Two times a day (BID) | ORAL | Status: DC

## 2021-02-14 MED ORDER — ACETAMINOPHEN 325 MG PO TABS: 650.0000 mg | ORAL_TABLET | Freq: Four times a day (QID) | ORAL | Status: DC | PRN

## 2021-02-14 MED ORDER — SENNOSIDES-DOCUSATE SODIUM 8.6-50 MG PO TABS: 1.0000 | ORAL_TABLET | Freq: Every evening | ORAL | Status: DC | PRN

## 2021-02-14 MED ORDER — BACITRACIN-POLYMYXIN B 500-10000 UNIT/GM OP OINT: TOPICAL_OINTMENT | Freq: Three times a day (TID) | OPHTHALMIC | Status: AC

## 2021-02-14 MED ORDER — TRAZODONE HCL 50 MG PO TABS: 25.0000 mg | ORAL_TABLET | Freq: Every evening | ORAL | Status: DC | PRN

## 2021-02-14 MED ORDER — THIAMINE HCL 100 MG PO TABS: 100.0000 mg | ORAL_TABLET | Freq: Every day | ORAL | Status: DC

## 2021-02-14 MED ORDER — CYANOCOBALAMIN 1000 MCG PO TABS: 1000.0000 ug | ORAL_TABLET | Freq: Two times a day (BID) | ORAL | Status: DC

## 2021-02-14 NOTE — Care Management Important Message (Signed)
Important Message  Patient Details  Name: AMDREW OBOYLE MRN: [id number] Date of Birth: 21-May-1950   Medicare Important Message Given:  Yes (late entry)     Corey Harold 02/14/2021, 4:02 PM

## 2021-02-14 NOTE — Discharge Summary (Signed)
Physician Discharge Summary  Larry [name] MHD:[id number] DOB: 03/09/1951 DOA: 02/06/2021  PCP: Benita Stabile, MD  Admit date: 02/06/2021 Discharge date: 02/14/2021  Time spent: 35 minutes  Recommendations for Outpatient Follow-up:  Take medications as prescribed Repeat basic metabolic panel to follow twice renal function Repeat TSH/thyroid panel in 8 weeks Continue assisting patient with alcohol cessation. Reassess blood pressure and further adjust antihypertensive regimen.   Discharge Diagnoses:  Principal Problem:   Alcohol withdrawal (HCC) Active Problems:   Falls frequently   HTN (hypertension) Hx of COPD Hypokalemia  Discharge Condition: Stable and improved.  Patient discharged to skilled nursing facility for further care and rehabilitation.  CODE STATUS: Full code  Diet recommendation: Heart healthy diet.  Filed Weights   02/12/21 0551 02/13/21 0500 02/14/21 0423  Weight: 69.5 kg 66.4 kg 62.7 kg    History of present illness:  As per H&P written by Dr. Flossie Dibble on 02/06/2021 Larry [name] is a 70 y.o. male with medical history significant of hypertension, chronic alcohol use, hypothyroidism, COPD, presented to the ED intoxicated... Subsequently upon evaluation in ED patient started having tremors, falls....  Procollagen initiated, requested admission for inpatient detox. Admitted with drinking half quart day,... Likely more, for many years, last drink last night   Patient Denies having: Fever, Chills, Cough, SOB, Chest Pain, Abd pain, N/V/D, headache, dizziness, lightheadedness,  Dysuria, Joint pain, rash, open wounds  Hospital Course:  Alcohol intoxication/alcohol withdrawal (HCC) -DTs are resolved -Discontinue scheduled Librium on 02/12/2021 -Patient no longer requiring Ativan -Continue thiamine and folic acid. -Unsteady gait, generalized weaknesses -Upon arrival Alcohol level 360,  -Extensive history of alcohol abuse,   Status post frequent  falls/generalized weakness and ambulatory dysfunction --Alcohol intoxication is contributory -PT/OT recommends SNF rehab -Patient is medically stable and will be discharged to [facility] nursing facility for further care and rehabilitation.   Hypertension --Accelerated hypertension -Continue home medication of HCTZ and lisinopril,  metoprolol 25 twice has been added for better blood pressure control. -Continue to follow vital signs and further adjust antihypertensive regimen as required.   History of COPD -stable, continue bronchodilators as needed   Hypokalemia -repleted and WNL at discharge   Per record ?? history of hypothyroidism -TSH normal at 2.65 - -Does not appear to be on thyroid medication PTA will not start at this time  Procedures: See below for x-ray reports.  Consultations: None  Discharge Exam: Vitals:   02/13/21 2123 02/14/21 0445  BP: (!) 149/79 122/74  Pulse: 70 71  Resp: 18 19  Temp: 98.6 F (37 C) 98.4 F (36.9 C)  SpO2: 96% 95%   General exam: Alert, awake, following commands appropriately cooperative with examination.  No acute withdrawal symptoms appreciated. Respiratory system: Clear to auscultation. Normal respiratory effort.  Good saturation on room air Cardiovascular system:RRR. No murmurs, rubs, gallops.  No JVD. Gastrointestinal system: Abdomen is nondistended, soft and nontender. No organomegaly or masses felt. Normal bowel sounds heard. Central nervous system: Generally weak bilaterally; no new focal deficit.  No significant tremors. Extremities: No cyanosis, clubbing or edema. Skin: No petechiae. Psychiatry: Judgement and insight appear normal. Mood & affect appropriate currently.    Discharge Instructions   Discharge Instructions     Diet - low sodium heart healthy   Complete by: As directed    Discharge instructions   Complete by: As directed    Maintain adequate hydration Physical therapy and rehabilitationgoals facility  protocol Heart healthy/low-sodium diet recommended      Allergies as of  02/14/2021   No Known Allergies      Medication List     STOP taking these medications    lisinopril 10 MG tablet Commonly known as: ZESTRIL       TAKE these medications    acetaminophen 325 MG tablet Commonly known as: TYLENOL Take 2 tablets (650 mg total) by mouth every 6 (six) hours as needed for mild pain or headache (or Fever >/= 101).   bacitracin-polymyxin b ophthalmic ointment Commonly known as: POLYSPORIN Place into both eyes 3 (three) times daily for 4 days. apply to eye every 12 hours while awake   cholecalciferol 25 MCG (1000 UNIT) tablet Commonly known as: VITAMIN D3 Take 1,000 Units by mouth daily.   cyanocobalamin 1000 MCG tablet Take 1 tablet (1,000 mcg total) by mouth in the morning and at bedtime. What changed: when to take this   lisinopril-hydrochlorothiazide 10-12.5 MG tablet Commonly known as: ZESTORETIC Take by mouth daily.   metoprolol tartrate 25 MG tablet Commonly known as: LOPRESSOR Take 1 tablet (25 mg total) by mouth 2 (two) times daily.   senna-docusate 8.6-50 MG tablet Commonly known as: Senokot-S Take 1 tablet by mouth at bedtime as needed for mild constipation.   thiamine 100 MG tablet Take 1 tablet (100 mg total) by mouth daily. Start taking on: February 15, 2021   traZODone 50 MG tablet Commonly known as: DESYREL Take 0.5 tablets (25 mg total) by mouth at bedtime as needed for sleep.       No Known Allergies  Contact information for follow-up providers     Benita Stabile, MD. Schedule an appointment as soon as possible for a visit in 10 day(s).   Specialty: Internal Medicine Why: After discharge from the skilled nursing facility. Contact information: [address] [place] [facility] [address] [phone]              Contact information for after-discharge care     Destination     [facility] .    Service: Skilled Nursing Contact information: [address] [place] [address] [phone]                      The results of significant diagnostics from this hospitalization (including imaging, microbiology, ancillary and laboratory) are listed below for reference.    Significant Diagnostic Studies: No results found.  Microbiology: Recent Results (from the past 240 hour(s))  Resp Panel by RT-PCR (Flu A&B, Covid) Nasopharyngeal Swab     Status: None   Collection Time: 02/06/21 11:42 AM   Specimen: Nasopharyngeal Swab; Nasopharyngeal(NP) swabs in vial transport medium  Result Value Ref Range Status   SARS Coronavirus 2 by RT PCR NEGATIVE NEGATIVE Final    Comment: (NOTE) SARS-CoV-2 target nucleic acids are NOT DETECTED.  The SARS-CoV-2 RNA is generally detectable in upper respiratory specimens during the acute phase of infection. The lowest concentration of SARS-CoV-2 viral copies this assay can detect is 138 copies/mL. A negative result does not preclude SARS-Cov-2 infection and should not be used as the sole basis for treatment or other patient management decisions. A negative result may occur with  improper specimen collection/handling, submission of specimen other than nasopharyngeal swab, presence of viral mutation(s) within the areas targeted by this assay, and inadequate number of viral copies(<138 copies/mL). A negative result must be combined with clinical observations, patient history, and epidemiological information. The expected result is Negative.  Fact Sheet for Patients:  BloggerCourse.com  Fact Sheet for Healthcare Providers:  SeriousBroker.it  This test is no t yet approved or cleared by the [place] FDA and  has been authorized for detection and/or diagnosis of SARS-CoV-2 by FDA under an Emergency Use Authorization (EUA). This EUA will remain  in effect (meaning this  test can be used) for the duration of the COVID-19 declaration under Section 564(b)(1) of the Act, 21 U.S.C.section 360bbb-3(b)(1), unless the authorization is terminated  or revoked sooner.       Influenza A by PCR NEGATIVE NEGATIVE Final   Influenza B by PCR NEGATIVE NEGATIVE Final    Comment: (NOTE) The Xpert Xpress SARS-CoV-2/FLU/RSV plus assay is intended as an aid in the diagnosis of influenza from Nasopharyngeal swab specimens and should not be used as a sole basis for treatment. Nasal washings and aspirates are unacceptable for Xpert Xpress SARS-CoV-2/FLU/RSV testing.  Fact Sheet for Patients: BloggerCourse.com  Fact Sheet for Healthcare Providers: SeriousBroker.it  This test is not yet approved or cleared by the [place] FDA and has been authorized for detection and/or diagnosis of SARS-CoV-2 by FDA under an Emergency Use Authorization (EUA). This EUA will remain in effect (meaning this test can be used) for the duration of the COVID-19 declaration under Section 564(b)(1) of the Act, 21 U.S.C. section 360bbb-3(b)(1), unless the authorization is terminated or revoked.  Performed at [facility], [address] [place], [place] [address]      Labs: Basic Metabolic Panel: Recent Labs  Lab 02/08/21 0602 02/09/21 0624 02/10/21 0534 02/11/21 0611 02/13/21 0505  NA 136 136 135 138 136  K 3.4* 3.6 3.1* 3.8 3.6  CL 103 105 104 106 104  CO2 26 25 25 28 26   GLUCOSE 93 91 97 110* 95  BUN 8 7* 6* 10 10  CREATININE 0.62 0.62 0.54* 0.61 0.66  CALCIUM 8.1* 8.0* 8.2* 8.2* 8.2*  MG  --   --  1.5*  --   --   PHOS  --   --  3.4  --   --    Liver Function Tests: Recent Labs  Lab 02/10/21 0534  ALBUMIN 2.4*   CBC: Recent Labs  Lab 02/08/21 0602 02/09/21 0624 02/10/21 0534 02/11/21 0611 02/13/21 0505  WBC 4.2 4.7 5.0 5.3 5.8  HGB 10.7* 11.4* 12.4* 11.8* 10.5*  HCT 31.9* 34.4* 36.3* 35.3* 31.6*  MCV 102.2*  103.9* 101.1* 103.2* 103.9*  PLT 225 191 193 183 189    BNP (last 3 results) Recent Labs    02/06/21 1327  BNP 174.0*   CBG: Recent Labs  Lab 02/10/21 0730 02/11/21 0725 02/12/21 0710 02/13/21 0727 02/14/21 0725  GLUCAP 91 98 203* 91 80    Signed:  02/16/21 MD.  [facility] 02/14/2021, 1:14 PM

## 2021-02-14 NOTE — TOC Transition Note (Signed)
Transition of Care [facility]) - CM/SW Discharge Note   Patient Details  Name: CASIMIRO LIENHARD MRN: [id number] Date of Birth: February 04, 1951  Transition of Care [facility]) CM/SW Contact:  Leitha Bleak, RN Phone Number: 02/14/2021, 11:51 AM   Clinical Narrative:   English as a second language teacher received.  [facility] ready for patient today. RN to call report. Medical necessity printed on 300. TOC will call EMS when patient is ready.   Final next level of care: Skilled Nursing Facility Barriers to Discharge: Barriers Resolved   Patient Goals and CMS Choice Patient states their goals for this hospitalization and ongoing recovery are:: agreeable to SNF CMS Medicare.gov Compare Post Acute Care list provided to:: Patient Choice offered to / list presented to : Patient  Discharge Placement              Patient chooses bed at:  [facility]) Patient to be transferred to facility by: EMS   Patient and family notified of of transfer: 02/14/21  Discharge Plan and Services   [facility]    Readmission Risk Interventions Readmission Risk Prevention Plan 02/11/2021  Transportation Screening Complete  Home Care Screening Complete  Medication Review (RN CM) Complete  Some recent data might be hidden

## 2021-02-14 NOTE — Progress Notes (Signed)
Called report to nurse Annasha at receiving facility [facility]. Awaiting transport.

## 2021-02-14 NOTE — Progress Notes (Signed)
Patient left via EMS. IV removed.telemetry removed.

## 2021-02-17 DIAGNOSIS — J449 Chronic obstructive pulmonary disease, unspecified: Secondary | ICD-10-CM | POA: Diagnosis not present

## 2021-02-17 DIAGNOSIS — R531 Weakness: Secondary | ICD-10-CM | POA: Diagnosis not present

## 2021-02-17 DIAGNOSIS — F10939 Alcohol use, unspecified with withdrawal, unspecified: Secondary | ICD-10-CM | POA: Diagnosis not present

## 2021-02-17 DIAGNOSIS — I10 Essential (primary) hypertension: Secondary | ICD-10-CM | POA: Diagnosis not present

## 2021-02-17 DIAGNOSIS — Z79899 Other long term (current) drug therapy: Secondary | ICD-10-CM | POA: Diagnosis not present

## 2021-02-17 DIAGNOSIS — F1090 Alcohol use, unspecified, uncomplicated: Secondary | ICD-10-CM | POA: Diagnosis not present

## 2021-02-17 DIAGNOSIS — E039 Hypothyroidism, unspecified: Secondary | ICD-10-CM | POA: Diagnosis not present

## 2021-03-09 ENCOUNTER — Emergency Department (HOSPITAL_COMMUNITY): Payer: Medicare HMO

## 2021-03-09 ENCOUNTER — Encounter (HOSPITAL_COMMUNITY): Payer: Self-pay | Admitting: Emergency Medicine

## 2021-03-09 ENCOUNTER — Inpatient Hospital Stay (HOSPITAL_COMMUNITY)
Admission: EM | Admit: 2021-03-09 | Discharge: 2021-03-15 | DRG: 896 | Disposition: A | Discharge status: Skilled Nursing Facility | Payer: Medicare HMO | Attending: Family Medicine | Admitting: Family Medicine

## 2021-03-09 ENCOUNTER — Other Ambulatory Visit: Payer: Self-pay

## 2021-03-09 DIAGNOSIS — R6251 Failure to thrive (child): Secondary | ICD-10-CM

## 2021-03-09 DIAGNOSIS — R2689 Other abnormalities of gait and mobility: Secondary | ICD-10-CM | POA: Diagnosis not present

## 2021-03-09 DIAGNOSIS — J60 Coalworker's pneumoconiosis: Secondary | ICD-10-CM | POA: Diagnosis present

## 2021-03-09 DIAGNOSIS — E860 Dehydration: Secondary | ICD-10-CM | POA: Diagnosis present

## 2021-03-09 DIAGNOSIS — E8889 Other specified metabolic disorders: Secondary | ICD-10-CM | POA: Diagnosis not present

## 2021-03-09 DIAGNOSIS — E43 Unspecified severe protein-calorie malnutrition: Secondary | ICD-10-CM | POA: Diagnosis not present

## 2021-03-09 DIAGNOSIS — M1990 Unspecified osteoarthritis, unspecified site: Secondary | ICD-10-CM | POA: Diagnosis present

## 2021-03-09 DIAGNOSIS — F10939 Alcohol use, unspecified with withdrawal, unspecified: Secondary | ICD-10-CM | POA: Diagnosis not present

## 2021-03-09 DIAGNOSIS — Y902 Blood alcohol level of 40-59 mg/100 ml: Secondary | ICD-10-CM | POA: Diagnosis present

## 2021-03-09 DIAGNOSIS — D696 Thrombocytopenia, unspecified: Secondary | ICD-10-CM | POA: Diagnosis not present

## 2021-03-09 DIAGNOSIS — F10920 Alcohol use, unspecified with intoxication, uncomplicated: Secondary | ICD-10-CM | POA: Diagnosis not present

## 2021-03-09 DIAGNOSIS — R778 Other specified abnormalities of plasma proteins: Secondary | ICD-10-CM

## 2021-03-09 DIAGNOSIS — H1011 Acute atopic conjunctivitis, right eye: Secondary | ICD-10-CM | POA: Diagnosis present

## 2021-03-09 DIAGNOSIS — Z833 Family history of diabetes mellitus: Secondary | ICD-10-CM

## 2021-03-09 DIAGNOSIS — E162 Hypoglycemia, unspecified: Secondary | ICD-10-CM | POA: Diagnosis not present

## 2021-03-09 DIAGNOSIS — J64 Unspecified pneumoconiosis: Secondary | ICD-10-CM | POA: Diagnosis not present

## 2021-03-09 DIAGNOSIS — E8729 Other acidosis: Secondary | ICD-10-CM

## 2021-03-09 DIAGNOSIS — R531 Weakness: Secondary | ICD-10-CM

## 2021-03-09 DIAGNOSIS — R296 Repeated falls: Secondary | ICD-10-CM | POA: Diagnosis present

## 2021-03-09 DIAGNOSIS — R0789 Other chest pain: Secondary | ICD-10-CM | POA: Diagnosis present

## 2021-03-09 DIAGNOSIS — R232 Flushing: Secondary | ICD-10-CM | POA: Diagnosis not present

## 2021-03-09 DIAGNOSIS — R0902 Hypoxemia: Secondary | ICD-10-CM | POA: Diagnosis not present

## 2021-03-09 DIAGNOSIS — Z681 Body mass index (BMI) 19 or less, adult: Secondary | ICD-10-CM

## 2021-03-09 DIAGNOSIS — F10929 Alcohol use, unspecified with intoxication, unspecified: Secondary | ICD-10-CM

## 2021-03-09 DIAGNOSIS — E039 Hypothyroidism, unspecified: Secondary | ICD-10-CM | POA: Diagnosis present

## 2021-03-09 DIAGNOSIS — R718 Other abnormality of red blood cells: Secondary | ICD-10-CM

## 2021-03-09 DIAGNOSIS — R627 Adult failure to thrive: Secondary | ICD-10-CM | POA: Diagnosis present

## 2021-03-09 DIAGNOSIS — R5381 Other malaise: Secondary | ICD-10-CM

## 2021-03-09 DIAGNOSIS — H5789 Other specified disorders of eye and adnexa: Secondary | ICD-10-CM

## 2021-03-09 DIAGNOSIS — F10229 Alcohol dependence with intoxication, unspecified: Secondary | ICD-10-CM | POA: Diagnosis present

## 2021-03-09 DIAGNOSIS — M6259 Muscle wasting and atrophy, not elsewhere classified, multiple sites: Secondary | ICD-10-CM | POA: Diagnosis not present

## 2021-03-09 DIAGNOSIS — J449 Chronic obstructive pulmonary disease, unspecified: Secondary | ICD-10-CM | POA: Diagnosis present

## 2021-03-09 DIAGNOSIS — R1312 Dysphagia, oropharyngeal phase: Secondary | ICD-10-CM | POA: Diagnosis not present

## 2021-03-09 DIAGNOSIS — Z79899 Other long term (current) drug therapy: Secondary | ICD-10-CM | POA: Diagnosis not present

## 2021-03-09 DIAGNOSIS — R64 Cachexia: Secondary | ICD-10-CM | POA: Diagnosis not present

## 2021-03-09 DIAGNOSIS — Z20822 Contact with and (suspected) exposure to covid-19: Secondary | ICD-10-CM | POA: Diagnosis not present

## 2021-03-09 DIAGNOSIS — F10239 Alcohol dependence with withdrawal, unspecified: Secondary | ICD-10-CM | POA: Diagnosis not present

## 2021-03-09 DIAGNOSIS — R9431 Abnormal electrocardiogram [ECG] [EKG]: Secondary | ICD-10-CM | POA: Diagnosis not present

## 2021-03-09 DIAGNOSIS — M6281 Muscle weakness (generalized): Secondary | ICD-10-CM | POA: Diagnosis not present

## 2021-03-09 DIAGNOSIS — Z9181 History of falling: Secondary | ICD-10-CM

## 2021-03-09 DIAGNOSIS — F10930 Alcohol use, unspecified with withdrawal, uncomplicated: Secondary | ICD-10-CM

## 2021-03-09 DIAGNOSIS — R7989 Other specified abnormal findings of blood chemistry: Secondary | ICD-10-CM

## 2021-03-09 DIAGNOSIS — S299XXA Unspecified injury of thorax, initial encounter: Secondary | ICD-10-CM | POA: Diagnosis not present

## 2021-03-09 DIAGNOSIS — I248 Other forms of acute ischemic heart disease: Secondary | ICD-10-CM | POA: Diagnosis present

## 2021-03-09 DIAGNOSIS — R262 Difficulty in walking, not elsewhere classified: Secondary | ICD-10-CM | POA: Diagnosis not present

## 2021-03-09 DIAGNOSIS — I10 Essential (primary) hypertension: Secondary | ICD-10-CM | POA: Diagnosis not present

## 2021-03-09 DIAGNOSIS — R7401 Elevation of levels of liver transaminase levels: Secondary | ICD-10-CM | POA: Diagnosis not present

## 2021-03-09 DIAGNOSIS — F10231 Alcohol dependence with withdrawal delirium: Principal | ICD-10-CM | POA: Diagnosis present

## 2021-03-09 DIAGNOSIS — Z043 Encounter for examination and observation following other accident: Secondary | ICD-10-CM | POA: Diagnosis not present

## 2021-03-09 DIAGNOSIS — F17210 Nicotine dependence, cigarettes, uncomplicated: Secondary | ICD-10-CM | POA: Diagnosis present

## 2021-03-09 DIAGNOSIS — R54 Age-related physical debility: Secondary | ICD-10-CM | POA: Diagnosis present

## 2021-03-09 HISTORY — DX: Alcohol use, unspecified with intoxication, unspecified: F10.929

## 2021-03-09 LAB — RESP PANEL BY RT-PCR (FLU A&B, COVID) ARPGX2
Influenza A by PCR: NEGATIVE
Influenza B by PCR: NEGATIVE
SARS Coronavirus 2 by RT PCR: NEGATIVE

## 2021-03-09 LAB — CBC WITH DIFFERENTIAL/PLATELET
Abs Immature Granulocytes: 0.02 10*3/uL (ref 0.00–0.07)
Basophils Absolute: 0 10*3/uL (ref 0.0–0.1)
Basophils Relative: 1 %
Eosinophils Absolute: 0 10*3/uL (ref 0.0–0.5)
Eosinophils Relative: 1 %
HCT: 41.1 % (ref 39.0–52.0)
Hemoglobin: 14.2 g/dL (ref 13.0–17.0)
Immature Granulocytes: 0 %
Lymphocytes Relative: 28 %
Lymphs Abs: 1.6 10*3/uL (ref 0.7–4.0)
MCH: 34.7 pg — ABNORMAL HIGH (ref 26.0–34.0)
MCHC: 34.5 g/dL (ref 30.0–36.0)
MCV: 100.5 fL — ABNORMAL HIGH (ref 80.0–100.0)
Monocytes Absolute: 0.3 10*3/uL (ref 0.1–1.0)
Monocytes Relative: 5 %
Neutro Abs: 3.6 10*3/uL (ref 1.7–7.7)
Neutrophils Relative %: 65 %
Platelets: 110 10*3/uL — ABNORMAL LOW (ref 150–400)
RBC: 4.09 MIL/uL — ABNORMAL LOW (ref 4.22–5.81)
RDW: 13.2 % (ref 11.5–15.5)
WBC: 5.6 10*3/uL (ref 4.0–10.5)
nRBC: 0 % (ref 0.0–0.2)

## 2021-03-09 LAB — COMPREHENSIVE METABOLIC PANEL
ALT: 31 U/L (ref 0–44)
AST: 57 U/L — ABNORMAL HIGH (ref 15–41)
Albumin: 3.8 g/dL (ref 3.5–5.0)
Alkaline Phosphatase: 88 U/L (ref 38–126)
Anion gap: 23 — ABNORMAL HIGH (ref 5–15)
BUN: 17 mg/dL (ref 8–23)
CO2: 18 mmol/L — ABNORMAL LOW (ref 22–32)
Calcium: 9.1 mg/dL (ref 8.9–10.3)
Chloride: 96 mmol/L — ABNORMAL LOW (ref 98–111)
Creatinine, Ser: 0.66 mg/dL (ref 0.61–1.24)
GFR, Estimated: >60 mL/min (ref >60)
Glucose, Bld: 75 mg/dL (ref 70–99)
Potassium: 4.2 mmol/L (ref 3.5–5.1)
Sodium: 137 mmol/L (ref 135–145)
Total Bilirubin: 1.4 mg/dL — ABNORMAL HIGH (ref 0.3–1.2)
Total Protein: 7.1 g/dL (ref 6.5–8.1)

## 2021-03-09 LAB — TROPONIN I (HIGH SENSITIVITY)
Troponin I (High Sensitivity): 20 ng/L — ABNORMAL HIGH (ref <18)
Troponin I (High Sensitivity): 24 ng/L — ABNORMAL HIGH (ref <18)
Troponin I (High Sensitivity): 29 ng/L — ABNORMAL HIGH (ref <18)

## 2021-03-09 LAB — CBG MONITORING, ED: Glucose-Capillary: 72 mg/dL (ref 70–99)

## 2021-03-09 LAB — ETHANOL: Alcohol, Ethyl (B): 47 mg/dL — ABNORMAL HIGH (ref <10)

## 2021-03-09 MED ORDER — SODIUM CHLORIDE 0.9 % IV SOLN: Freq: Once | INTRAVENOUS | Status: AC

## 2021-03-09 MED ORDER — ADULT MULTIVITAMIN W/MINERALS CH
1.0000 | ORAL_TABLET | Freq: Every day | ORAL | Status: DC
  Administered 2021-03-10 – 2021-03-15 (×6): 1 via ORAL
  Filled 2021-03-09 (×6): qty 1

## 2021-03-09 MED ORDER — LORAZEPAM 2 MG/ML IJ SOLN: 1.0000 mg | INTRAMUSCULAR | Status: AC | PRN

## 2021-03-09 MED ORDER — LORAZEPAM 2 MG/ML IJ SOLN
1.0000 mg | Freq: Once | INTRAMUSCULAR | Status: AC
  Administered 2021-03-09: 1 mg via INTRAVENOUS
  Filled 2021-03-09: qty 1

## 2021-03-09 MED ORDER — METOPROLOL TARTRATE 25 MG PO TABS
25.0000 mg | ORAL_TABLET | Freq: Two times a day (BID) | ORAL | Status: DC
  Administered 2021-03-09 – 2021-03-14 (×10): 25 mg via ORAL
  Filled 2021-03-09 (×12): qty 1

## 2021-03-09 MED ORDER — VITAMIN D 25 MCG (1000 UNIT) PO TABS
1000.0000 [IU] | ORAL_TABLET | Freq: Every day | ORAL | Status: DC
  Administered 2021-03-10 – 2021-03-15 (×6): 1000 [IU] via ORAL
  Filled 2021-03-09 (×6): qty 1

## 2021-03-09 MED ORDER — ACETAMINOPHEN 325 MG PO TABS: 650.0000 mg | ORAL_TABLET | Freq: Four times a day (QID) | ORAL | Status: DC | PRN

## 2021-03-09 MED ORDER — SODIUM CHLORIDE 0.9 % IV BOLUS
500.0000 mL | Freq: Once | INTRAVENOUS | Status: AC
  Administered 2021-03-09: 500 mL via INTRAVENOUS

## 2021-03-09 MED ORDER — THIAMINE HCL 100 MG/ML IJ SOLN: 100.0000 mg | Freq: Every day | INTRAMUSCULAR | Status: DC

## 2021-03-09 MED ORDER — LORAZEPAM 1 MG PO TABS
1.0000 mg | ORAL_TABLET | ORAL | Status: AC | PRN
  Administered 2021-03-10: 12:00:00 2 mg via ORAL
  Filled 2021-03-09: qty 2

## 2021-03-09 MED ORDER — THIAMINE HCL 100 MG PO TABS
100.0000 mg | ORAL_TABLET | Freq: Every day | ORAL | Status: DC
  Administered 2021-03-10 – 2021-03-15 (×6): 100 mg via ORAL
  Filled 2021-03-09 (×6): qty 1

## 2021-03-09 MED ORDER — VITAMIN B-12 1000 MCG PO TABS
1000.0000 ug | ORAL_TABLET | Freq: Every day | ORAL | Status: DC
  Administered 2021-03-10 – 2021-03-15 (×6): 1000 ug via ORAL
  Filled 2021-03-09 (×6): qty 1

## 2021-03-09 MED ORDER — LISINOPRIL 10 MG PO TABS
10.0000 mg | ORAL_TABLET | Freq: Once | ORAL | Status: AC
  Administered 2021-03-09: 10 mg via ORAL
  Filled 2021-03-09: qty 1

## 2021-03-09 MED ORDER — FOLIC ACID 1 MG PO TABS
1.0000 mg | ORAL_TABLET | Freq: Every day | ORAL | Status: DC
  Administered 2021-03-10 – 2021-03-15 (×6): 1 mg via ORAL
  Filled 2021-03-09 (×6): qty 1

## 2021-03-09 MED ORDER — ENSURE ENLIVE PO LIQD
237.0000 mL | Freq: Two times a day (BID) | ORAL | Status: DC
Start: 2021-03-10 — End: 2021-03-15
  Administered 2021-03-11 – 2021-03-15 (×9): 237 mL via ORAL
  Filled 2021-03-09 (×2): qty 237

## 2021-03-09 MED ORDER — NAPHAZOLINE-GLYCERIN 0.03-0.5 % OP SOLN
1.0000 [drp] | Freq: Four times a day (QID) | OPHTHALMIC | Status: DC | PRN
  Filled 2021-03-09: qty 15

## 2021-03-09 MED ORDER — CHLORDIAZEPOXIDE HCL 5 MG PO CAPS
5.0000 mg | ORAL_CAPSULE | Freq: Three times a day (TID) | ORAL | Status: DC
  Administered 2021-03-09: 5 mg via ORAL
  Filled 2021-03-09: qty 1

## 2021-03-09 MED ORDER — ENOXAPARIN SODIUM 40 MG/0.4ML IJ SOSY
40.0000 mg | PREFILLED_SYRINGE | INTRAMUSCULAR | Status: DC
  Administered 2021-03-09: 40 mg via SUBCUTANEOUS
  Filled 2021-03-09: qty 0.4

## 2021-03-09 NOTE — H&P (Signed)
History and Physical  [name] LOV:[id number] DOB: 1950-07-28 DOA: 03/09/2021  Referring physician: Burgess Amor, PA-C  PCP: Benita Stabile, MD  Patient coming from: Home  Chief Complaint: Weakness and recurrent falls  HPI: Larry [name] is a 70 y.o. male with medical history significant for chronic alcohol abuse and hypertension who presented to the emergency department via EMS due to generalized weakness and recurrent falls due to ambulatory difficulty.  Patient states that he has not been able to get out of bed without falling since last 4 days due to weakness, he endorsed only little oral intake over this time.  Patient states he had a fall about 4 days ago whereby he landed on his right side and now complain of right-sided chest wall with concern for possible rib fractures.   He was recently admitted and discharged from 10/16-10/24 due to alcohol withdrawal/alcohol intoxication after which he was discharged to a rehab.  Patient left rehab after regaining some strength by signing AMA and has since returned home where he lives in a trailer alone with 6 cats and 1 dog, he has not been taking care of himself.  He activated EMS today due to ongoing weakness, on arrival of EMS team, it was noted that his house was unkempt and feces were noted on the floor.  Patient was taken to the ED for further evaluation and management.  He denies chest pain, fever, chills, headache, nausea, vomiting or abdominal pain.  Last alcohol consumption was more than 24 hours prior to admission due to being too weak to get out of bed and to get to any alcoholic drink.  ED Course:  In the emergency department, he was intermittently tachycardic, BP was 169/79, other vital signs were within normal range.  Work-up in the ED showed normal CBC except thrombocytopenia and MCV of 100.5, BMP showed acidosis with bicarb of 18, AST 57 and anion gap of 23, troponin x2- 24 > 47. Chest x-ray showed no acute right rib fractures and no  acute cardiopulmonary process.  Calcified granulomas throughout both lungs. Right elbow x-ray showed no acute osseous abnormality Lisinopril was given due to elevated BP, Ativan was given and patient was provided with IV hydration.  Hospitalist was asked to admit patient for further evaluation and management.  Review of Systems: Constitutional: Negative for chills and fever.  HENT: Negative for ear pain and sore throat.   Eyes: Negative for pain and visual disturbance.  Respiratory: Negative for cough, chest tightness and shortness of breath.   Cardiovascular: Negative for chest pain and palpitations.  Gastrointestinal: Positive for constipation.  Negative for abdominal pain and vomiting.  Endocrine: Negative for polyphagia and polyuria.  Genitourinary: Negative for decreased urine volume, dysuria, enuresis Musculoskeletal: Positive for fall.  Negative for arthralgias and back pain.  Skin: Negative for color change and rash.  Allergic/Immunologic: Negative for immunocompromised state.  Neurological: Positive for tremors and weakness.  Negative for syncope, speech difficulty and headaches.  Hematological: Does not bruise/bleed easily.  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 He has been smoking an average  of 1 pack per day. He has never used smokeless tobacco. He reports current alcohol use of about 10.0 standard drinks per week. He reports that he does not use drugs.   No Known Allergies  Family History  Problem Relation Age of Onset   Thyroid disease Mother    Diabetes Other      Prior to Admission medications   Medication Sig Start Date End Date Taking? Authorizing Provider  acetaminophen (TYLENOL) 325 MG tablet Take 2 tablets (650 mg total) by mouth every 6 (six) hours as needed for mild pain or headache (or Fever >/= 101). 02/14/21   Barton Dubois, MD  cholecalciferol (VITAMIN D3) 25 MCG (1000 UNIT) tablet Take 1,000 Units by mouth daily.    [provider]  cyanocobalamin 1000 MCG tablet Take 1 tablet (1,000 mcg total) by mouth in the morning and at bedtime. 02/14/21   Barton Dubois, MD  lisinopril-hydrochlorothiazide (PRINZIDE,ZESTORETIC) 10-12.5 MG tablet Take by mouth daily.  Patient not taking: No sig reported 11/27/17   [provider]  metoprolol tartrate (LOPRESSOR) 25 MG tablet Take 1 tablet (25 mg total) by mouth 2 (two) times daily. 02/14/21   Barton Dubois, MD  senna-docusate (SENOKOT-S) 8.6-50 MG tablet Take 1 tablet by mouth at bedtime as needed for mild constipation. 02/14/21   Barton Dubois, MD  thiamine 100 MG tablet Take 1 tablet (100 mg total) by mouth daily. 02/15/21   Barton Dubois, MD  traZODone (DESYREL) 50 MG tablet Take 0.5 tablets (25 mg total) by mouth at bedtime as needed for sleep. 02/14/21   Barton Dubois, MD    Physical Exam: BP (!) 165/83   Pulse 94   Temp 98.2 F (36.8 C) (Oral)   Resp 17   Ht 6\' 2"  (1.88 m)   Wt 61.2 kg   SpO2 100%   BMI 17.33 kg/m   General: 70 y.o. year-old male cachectic, ill-appearing but in no acute distress.  Alert and oriented x3. HEENT: Dry mucous membrane.  White sticky mucous discharge noted in right eye.  NCAT, EOMI Neck: Supple, trachea medial Cardiovascular: Regular rate and rhythm with no  rubs or gallops.  No thyromegaly or JVD noted.  No lower extremity edema. 2/4 pulses in all 4 extremities. Respiratory: Clear to auscultation with no wheezes or rales. Good inspiratory effort. Abdomen: Soft, nontender nondistended with normal bowel sounds x4 quadrants. Muskuloskeletal: No cyanosis, clubbing or edema noted bilaterally Neuro: Tremors noted in hands bilaterally.  CN II-XII intact, sensation, reflexes intact Skin: No ulcerative lesions noted or rashes Psychiatry: Mood is appropriate for condition and setting          Labs on Admission:  Basic Metabolic Panel: Recent Labs  Lab 03/09/21 1400  NA 137  K 4.2  CL 96*  CO2 18*  GLUCOSE 75  BUN 17  CREATININE 0.66  CALCIUM 9.1   Liver Function Tests: Recent Labs  Lab 03/09/21 1400  AST 57*  ALT 31  ALKPHOS 88  BILITOT 1.4*  PROT 7.1  ALBUMIN 3.8   No results for input(s): LIPASE, AMYLASE in the last 168 hours. No results for input(s): AMMONIA in  the last 168 hours. CBC: Recent Labs  Lab 03/09/21 1400  WBC 5.6  NEUTROABS 3.6  HGB 14.2  HCT 41.1  MCV 100.5*  PLT 110*   Cardiac Enzymes: No results for input(s): CKTOTAL, CKMB, CKMBINDEX, TROPONINI in the last 168 hours.  BNP (last 3 results) Recent Labs    02/06/21 1327  BNP 174.0*    ProBNP (last 3 results) No results for input(s): PROBNP in the last 8760 hours.  CBG: Recent Labs  Lab 03/09/21 1426  GLUCAP 72    Radiological Exams on Admission: DG Ribs Unilateral W/Chest Right  Result Date: 03/09/2021 CLINICAL DATA:  Fall with right chest pain. EXAM: RIGHT RIBS AND CHEST - 3+ VIEW COMPARISON:  Chest x-ray 08/29/2020. FINDINGS: No acute fracture or other bone lesions are seen involving the ribs. There are chronic right 4th-8th rib fractures. ORIF proximal right humeral fracture is unchanged. There is no evidence of pneumothorax or pleural effusion. Numerous bilateral calcified nodular densities and calcified pleural plaques are unchanged.  There is no focal lung infiltrate. Heart size and mediastinal contours are within normal limits. IMPRESSION: 1. No acute right rib fractures. 2. No acute cardiopulmonary process. 3. Calcified granulomas throughout both lungs. Electronically Signed   By: Darliss Cheney M.D.   On: 03/09/2021 15:06   DG Elbow Complete Right  Result Date: 03/09/2021 CLINICAL DATA:  Fall. EXAM: RIGHT ELBOW - COMPLETE 3+ VIEW COMPARISON:  None. FINDINGS: There is no evidence of fracture, dislocation, or joint effusion. There is no evidence of arthropathy or other focal bone abnormality. Osteopenia. Soft tissues are unremarkable. IMPRESSION: 1.  No acute osseous abnormality. Electronically Signed   By: Obie Dredge M.D.   On: 03/09/2021 15:04    EKG: I independently viewed the EKG done and my findings are as followed: Normal sinus rhythm at a rate of 95 bpm with repolarization abnormality not significantly different from EKG on 08/29/2020  Assessment/Plan Present on Admission:  Alcohol withdrawal (HCC)  Principal Problem:   Alcohol withdrawal (HCC) Active Problems:   Falls frequently   Essential hypertension   Alcohol intoxication (HCC)   Generalized weakness   Elevated troponin   Failure to thrive (child)   Physical deconditioning   Elevated AST (SGOT)   Thrombocytopenia (HCC)   Elevated MCV   High anion gap metabolic acidosis   Dehydration   Pneumoconiosis, coal, 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[facility])   Eye discharge  Alcohol intoxication/withdrawal Alcohol level was 47, AST 57, ALT 31 Continue Librium taper , CIWA protocol, thiamine and multivitamin Continue fall precaution and neuro checks Continue to monitor liver enzymes Patient will be counseled on alcohol withdrawal cessation when more stable  High anion gap metabolic acidosis debility secondary to alcoholic/starvation ketoacidosis Anion gap 23, bicarb 18, Continue IV hydration Continue to monitor for anion gap closure  Dehydration Continue IV  hydration  Frequent falls due to ambulatory dysfunction secondary to generalized weakness Failure to thrive in an adult/physical deconditioning Protein supplement will be provided Continue fall precaution and neurochecks Continue PT/OT eval and treat  Elevated troponin possibly secondary to type II demand ischemia Troponin x2- 20 > 24, patient denies chest pain Continue to trend troponin  Essential hypertension Continue lisinopril, Lopressor HCTZ will be held at this time due to dehydration  Elevated MCV MCV 100.5, this is possibly secondary to vitamin B12/folate deficiency B12 and folate levels will be checked Continue home vitamin B12  Thrombocytopenia possibly reactive Platelets 110, continue to monitor platelet levels  Pneumoconiosis Chest x-ray showed calcified granulomas throughout  both lungs. Patient endorsed having coal workers lung disease, he is stable and asymptomatic.  Eye discharge (?  Allergic conjunctivitis) Patient presents with white sticky mucus discharge in the right eye Continue eyedrops  DVT prophylaxis: Lovenox  Code Status: Full code  Family Communication: None at bedside  Disposition Plan:  Patient is from:                        home Anticipated DC to:                   SNF or family members home Anticipated DC date:               2-3 days Anticipated DC barriers:         Patient requires inpatient management due to alcohol intoxication/withdrawal and generalized weakness with frequent falls requiring inpatient detox/PT eval and treat  Consults called: None  Admission status: Inpatient    Bernadette Hoit MD [facility]  03/09/2021, 9:32 PM

## 2021-03-09 NOTE — ED Provider Notes (Signed)
[facility] EMERGENCY DEPARTMENT Provider Note   CSN: [id number] Arrival date & time: 03/09/21  1239     History Chief Complaint  Patient presents with   Weakness    Larry [name] is a 70 y.o. male presenting with generalized weakness and difficulty with ambulation which has been severe for the past 4 days.  Past medical history significant for alcohol abuse, states he drinks daily but has been trying to cut back, states he last drank just prior to arrival here.  He states he has been unable to get out of his bed for the past 4 days without falling and has had little p.o. intake over this period of time.  He reports falling 4 days ago, the last time he was out of bed, landed on his right side and has had right chest wall pain and concern for possible rib fracture.  He denies head injury.  He does have a history of DTs and states he has been imagining his cats standing on him and looking at him, when he opens his eyes however his cats are not in the room.  He has had some tremor, he denies nausea or vomiting.  He was last admitted here with similar complaints 1 month ago when he was actively withdrawing from alcohol.  He was discharged from here and sent to rehab.  Per patient's report he left there on his own accord, has been home for 3 weeks but states he has been able to walk until the last 4 days.  He states he has been taking his oral vitamin B-12 and thiamine until 4 days ago.  According to EMS, patient's home is in total disrepair, there is feces on the floor throughout his home.  He has 6 cats and 1 dog.  Patient states he called animal control prior to coming here as he knew he would not be able to come back to his home anytime soon.  He states animal control has picked up his dog and they will plan to go back tomorrow for his cats.  The history is provided by the patient.      Past Medical History:  Diagnosis Date   Alcohol abuse    Arthritis    COPD (chronic obstructive  pulmonary disease) (HCC)    black lung   Hypertension    Shingles    Shortness of breath dyspnea    SOB with exertion   Thyroid disease    hypothyroidism    Patient Active Problem List   Diagnosis Date Noted   Alcohol withdrawal (HCC) 02/06/2021   Falls frequently 02/06/2021   HTN (hypertension) 02/06/2021   Proximal humerus fracture 09/24/2014    Past Surgical History:  Procedure Laterality Date   CATARACT EXTRACTION W/ INTRAOCULAR LENS  IMPLANT, BILATERAL     KNEE SURGERY     ORIF HUMERUS FRACTURE Right 09/24/2014   Procedure: OPEN REDUCTION INTERNAL FIXATION (ORIF) PROXIMAL HUMERUS FRACTURE;  Surgeon: Jones Broom, MD;  Location: [facility];  Service: Orthopedics;  Laterality: Right;   TONSILLECTOMY         Family History  Problem Relation Age of Onset   Thyroid disease Mother    Diabetes Other     Social History   Tobacco Use   Smoking status: Every Day    Packs/day: 1.00    Types: Cigarettes   Smokeless tobacco: Never  Vaping Use   Vaping Use: Never used  Substance Use Topics   Alcohol use: Yes  Alcohol/week: 10.0 standard drinks    Types: 10 Shots of liquor per week    Comment: drinks 1/5 daily   Drug use: No    Home Medications Prior to Admission medications   Medication Sig Start Date End Date Taking? Authorizing Provider  acetaminophen (TYLENOL) 325 MG tablet Take 2 tablets (650 mg total) by mouth every 6 (six) hours as needed for mild pain or headache (or Fever >/= 101). 02/14/21   Vassie Loll, MD  cholecalciferol (VITAMIN D3) 25 MCG (1000 UNIT) tablet Take 1,000 Units by mouth daily.    [provider]  cyanocobalamin 1000 MCG tablet Take 1 tablet (1,000 mcg total) by mouth in the morning and at bedtime. 02/14/21   Vassie Loll, MD  lisinopril-hydrochlorothiazide (PRINZIDE,ZESTORETIC) 10-12.5 MG tablet Take by mouth daily.  Patient not taking: No sig reported 11/27/17   [provider]  metoprolol tartrate (LOPRESSOR) 25 MG  tablet Take 1 tablet (25 mg total) by mouth 2 (two) times daily. 02/14/21   Vassie Loll, MD  senna-docusate (SENOKOT-S) 8.6-50 MG tablet Take 1 tablet by mouth at bedtime as needed for mild constipation. 02/14/21   Vassie Loll, MD  thiamine 100 MG tablet Take 1 tablet (100 mg total) by mouth daily. 02/15/21   Vassie Loll, MD  traZODone (DESYREL) 50 MG tablet Take 0.5 tablets (25 mg total) by mouth at bedtime as needed for sleep. 02/14/21   Vassie Loll, MD    Allergies    Patient has no known allergies.  Review of Systems   Review of Systems  Constitutional:  Negative for chills and fever.  HENT:  Negative for congestion and sore throat.   Eyes: Negative.   Respiratory:  Negative for chest tightness and shortness of breath.   Cardiovascular:  Negative for chest pain.  Gastrointestinal:  Positive for constipation. Negative for abdominal pain, nausea and vomiting.  Genitourinary: Negative.   Musculoskeletal:  Negative for arthralgias, joint swelling and neck pain.  Skin: Negative.  Negative for rash and wound.  Neurological:  Positive for tremors and weakness. Negative for dizziness, light-headedness, numbness and headaches.  Psychiatric/Behavioral: Negative.    All other systems reviewed and are negative.  Physical Exam Updated Vital Signs BP (!) 165/83   Pulse 94   Temp 98.2 F (36.8 C) (Oral)   Resp 17   Ht  (1.88 m)   Wt 61.2 kg   SpO2 100%   BMI 17.33 kg/m   Physical Exam Vitals and nursing note reviewed.  Constitutional:      Appearance: He is well-developed. He is ill-appearing.     Comments: Unkempt.  Cachectic  HENT:     Head: Normocephalic and atraumatic.     Mouth/Throat:     Mouth: Mucous membranes are dry.  Eyes:     Extraocular Movements: Extraocular movements intact.     Conjunctiva/sclera: Conjunctivae normal.     Pupils: Pupils are equal, round, and reactive to light.  Cardiovascular:     Rate and Rhythm: Normal rate and regular  rhythm.     Heart sounds: Normal heart sounds.  Pulmonary:     Effort: Pulmonary effort is normal.     Breath sounds: Normal breath sounds. No wheezing.  Abdominal:     General: Bowel sounds are normal.     Palpations: Abdomen is soft.     Tenderness: There is no abdominal tenderness. There is no guarding.  Musculoskeletal:        General: No swelling. Normal range of motion.  Cervical back: Normal range of motion.  Skin:    General: Skin is warm and dry.  Neurological:     General: No focal deficit present.     Mental Status: He is alert and oriented to person, place, and time.    ED Results / Procedures / Treatments   Labs (all labs ordered are listed, but only abnormal results are displayed) Labs Reviewed  CBC WITH DIFFERENTIAL/PLATELET - Abnormal; Notable for the following components:      Result Value   RBC 4.09 (*)    MCV 100.5 (*)    MCH 34.7 (*)    Platelets 110 (*)    All other components within normal limits  COMPREHENSIVE METABOLIC PANEL - Abnormal; Notable for the following components:   Chloride 96 (*)    CO2 18 (*)    AST 57 (*)    Total Bilirubin 1.4 (*)    Anion gap 23 (*)    All other components within normal limits  ETHANOL - Abnormal; Notable for the following components:   Alcohol, Ethyl (B) 47 (*)    All other components within normal limits  TROPONIN I (HIGH SENSITIVITY) - Abnormal; Notable for the following components:   Troponin I (High Sensitivity) 20 (*)    All other components within normal limits  TROPONIN I (HIGH SENSITIVITY) - Abnormal; Notable for the following components:   Troponin I (High Sensitivity) 24 (*)    All other components within normal limits  URINALYSIS, ROUTINE W REFLEX MICROSCOPIC  CBG MONITORING, ED    EKG EKG Interpretation  Date/Time:  Wednesday March 09 2021 14:12:31 EST Ventricular Rate:  95 PR Interval:  165 QRS Duration: 134 QT Interval:  372 QTC Calculation: 468 R Axis:   -66 Text  Interpretation: Sinus rhythm IVCD, consider atypical RBBB LVH with IVCD and secondary repol abnrm Inferior infarct, acute Lateral leads are also involved Baseline wander in lead(s) V4 No significant change since last tracing Confirmed by Susy Frizzle [phone]) on 03/09/2021 2:50:16 PM  Radiology DG Ribs Unilateral W/Chest Right  Result Date: 03/09/2021 CLINICAL DATA:  Fall with right chest pain. EXAM: RIGHT RIBS AND CHEST - 3+ VIEW COMPARISON:  Chest x-ray 08/29/2020. FINDINGS: No acute fracture or other bone lesions are seen involving the ribs. There are chronic right 4th-8th rib fractures. ORIF proximal right humeral fracture is unchanged. There is no evidence of pneumothorax or pleural effusion. Numerous bilateral calcified nodular densities and calcified pleural plaques are unchanged. There is no focal lung infiltrate. Heart size and mediastinal contours are within normal limits. IMPRESSION: 1. No acute right rib fractures. 2. No acute cardiopulmonary process. 3. Calcified granulomas throughout both lungs. Electronically Signed   By: Darliss Cheney M.D.   On: 03/09/2021 15:06   DG Elbow Complete Right  Result Date: 03/09/2021 CLINICAL DATA:  Fall. EXAM: RIGHT ELBOW - COMPLETE 3+ VIEW COMPARISON:  None. FINDINGS: There is no evidence of fracture, dislocation, or joint effusion. There is no evidence of arthropathy or other focal bone abnormality. Osteopenia. Soft tissues are unremarkable. IMPRESSION: 1.  No acute osseous abnormality. Electronically Signed   By: Obie Dredge M.D.   On: 03/09/2021 15:04    Procedures Procedures   Medications Ordered in ED Medications  sodium chloride 0.9 % bolus 500 mL (has no administration in time range)  sodium chloride 0.9 % bolus 500 mL (0 mLs Intravenous Stopped 03/09/21 1646)  lisinopril (ZESTRIL) tablet 10 mg (10 mg Oral Given 03/09/21 1410)  LORazepam (ATIVAN) injection 1  mg (1 mg Intravenous Given 03/09/21 1715)    ED Course  I have reviewed  the triage vital signs and the nursing notes.  Pertinent labs & imaging results that were available during my care of the patient were reviewed by me and considered in my medical decision making (see chart for details).    MDM Rules/Calculators/A&P                           Labs and imaging reviewed and discussed with patient.  He has no acute fractures from his last fall 4 days ago.  His alcohol level is low at 47, he did have some brief tachycardia up to the mid 140 range, although he was actively trying to have a bowel movement with significant straining at this time.  Initially he had complaints of feeling impacted, but after he passed a large hard stool this symptom resolved.  He was given an IV dose of Ativan to treat for possible etoh withdrawals.  Patient would benefit from admission for EtOH withdrawal, also PT and OT evaluation for further evaluation of his weakness.  Discussed with Dr. Thomes Dinning who accepts patient for admission. 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

## 2021-03-09 NOTE — ED Notes (Signed)
EDP made aware of tachycardia.

## 2021-03-09 NOTE — ED Triage Notes (Signed)
Pt arrived [facility]. Pt called out stating he is feeling weak and SOB. Pt has had multiple falls and is a alcoholic. Pt lives in horrible living conditions with roaches, 6 cats and 1 dog. Pts home is not safe for living per EMS. Pt states he drank liquor before coming to the hospital. Pt was sent to [facility] rehab after last visit to [facility]. Pt signed himself out AMA after regaining some strength.

## 2021-03-10 DIAGNOSIS — R531 Weakness: Secondary | ICD-10-CM | POA: Diagnosis not present

## 2021-03-10 DIAGNOSIS — M6281 Muscle weakness (generalized): Secondary | ICD-10-CM

## 2021-03-10 DIAGNOSIS — J64 Unspecified pneumoconiosis: Secondary | ICD-10-CM

## 2021-03-10 DIAGNOSIS — F10939 Alcohol use, unspecified with withdrawal, unspecified: Secondary | ICD-10-CM | POA: Diagnosis not present

## 2021-03-10 DIAGNOSIS — R718 Other abnormality of red blood cells: Secondary | ICD-10-CM | POA: Diagnosis not present

## 2021-03-10 DIAGNOSIS — E43 Unspecified severe protein-calorie malnutrition: Secondary | ICD-10-CM | POA: Insufficient documentation

## 2021-03-10 DIAGNOSIS — E860 Dehydration: Secondary | ICD-10-CM | POA: Diagnosis not present

## 2021-03-10 LAB — CBC
HCT: 34.1 % — ABNORMAL LOW (ref 39.0–52.0)
Hemoglobin: 11.5 g/dL — ABNORMAL LOW (ref 13.0–17.0)
MCH: 34.2 pg — ABNORMAL HIGH (ref 26.0–34.0)
MCHC: 33.7 g/dL (ref 30.0–36.0)
MCV: 101.5 fL — ABNORMAL HIGH (ref 80.0–100.0)
Platelets: 85 10*3/uL — ABNORMAL LOW (ref 150–400)
RBC: 3.36 MIL/uL — ABNORMAL LOW (ref 4.22–5.81)
RDW: 13.1 % (ref 11.5–15.5)
WBC: 5.2 10*3/uL (ref 4.0–10.5)
nRBC: 0 % (ref 0.0–0.2)

## 2021-03-10 LAB — COMPREHENSIVE METABOLIC PANEL
ALT: 26 U/L (ref 0–44)
AST: 42 U/L — ABNORMAL HIGH (ref 15–41)
Albumin: 3.4 g/dL — ABNORMAL LOW (ref 3.5–5.0)
Alkaline Phosphatase: 80 U/L (ref 38–126)
Anion gap: 13 (ref 5–15)
BUN: 16 mg/dL (ref 8–23)
CO2: 26 mmol/L (ref 22–32)
Calcium: 9.1 mg/dL (ref 8.9–10.3)
Chloride: 95 mmol/L — ABNORMAL LOW (ref 98–111)
Creatinine, Ser: 0.68 mg/dL (ref 0.61–1.24)
GFR, Estimated: >60 mL/min (ref >60)
Glucose, Bld: 90 mg/dL (ref 70–99)
Potassium: 4.1 mmol/L (ref 3.5–5.1)
Sodium: 134 mmol/L — ABNORMAL LOW (ref 135–145)
Total Bilirubin: 2 mg/dL — ABNORMAL HIGH (ref 0.3–1.2)
Total Protein: 6.3 g/dL — ABNORMAL LOW (ref 6.5–8.1)

## 2021-03-10 LAB — TROPONIN I (HIGH SENSITIVITY): Troponin I (High Sensitivity): 37 ng/L — ABNORMAL HIGH (ref <18)

## 2021-03-10 LAB — FOLATE: Folate: 7.5 ng/mL (ref >5.9)

## 2021-03-10 LAB — PROTIME-INR
INR: 1 (ref 0.8–1.2)
Prothrombin Time: 12.7 seconds (ref 11.4–15.2)

## 2021-03-10 LAB — APTT: aPTT: 31 seconds (ref 24–36)

## 2021-03-10 LAB — PHOSPHORUS: Phosphorus: 2.6 mg/dL (ref 2.5–4.6)

## 2021-03-10 LAB — MAGNESIUM: Magnesium: 1.7 mg/dL (ref 1.7–2.4)

## 2021-03-10 LAB — VITAMIN B12: Vitamin B-12: 696 pg/mL (ref 180–914)

## 2021-03-10 MED ORDER — LOPERAMIDE HCL 2 MG PO CAPS: 2.0000 mg | ORAL_CAPSULE | ORAL | Status: AC | PRN

## 2021-03-10 MED ORDER — CHLORDIAZEPOXIDE HCL 25 MG PO CAPS
25.0000 mg | ORAL_CAPSULE | Freq: Four times a day (QID) | ORAL | Status: AC
  Administered 2021-03-10 (×4): 25 mg via ORAL
  Filled 2021-03-10 (×4): qty 1

## 2021-03-10 MED ORDER — HYDROXYZINE HCL 25 MG PO TABS: 25.0000 mg | ORAL_TABLET | Freq: Four times a day (QID) | ORAL | Status: AC | PRN

## 2021-03-10 MED ORDER — CHLORDIAZEPOXIDE HCL 25 MG PO CAPS
25.0000 mg | ORAL_CAPSULE | Freq: Three times a day (TID) | ORAL | Status: AC
  Administered 2021-03-11 (×3): 25 mg via ORAL
  Filled 2021-03-10 (×3): qty 1

## 2021-03-10 MED ORDER — SODIUM CHLORIDE 0.9 % IV SOLN
INTRAVENOUS | Status: DC
  Administered 2021-03-11: 1000 mL via INTRAVENOUS

## 2021-03-10 MED ORDER — ONDANSETRON 4 MG PO TBDP: 4.0000 mg | ORAL_TABLET | Freq: Four times a day (QID) | ORAL | Status: AC | PRN

## 2021-03-10 MED ORDER — CHLORDIAZEPOXIDE HCL 25 MG PO CAPS: 25.0000 mg | ORAL_CAPSULE | Freq: Four times a day (QID) | ORAL | Status: AC | PRN

## 2021-03-10 MED ORDER — CHLORDIAZEPOXIDE HCL 25 MG PO CAPS
25.0000 mg | ORAL_CAPSULE | ORAL | Status: AC
  Administered 2021-03-12 (×2): 25 mg via ORAL
  Filled 2021-03-10 (×2): qty 1

## 2021-03-10 MED ORDER — THIAMINE HCL 100 MG/ML IJ SOLN
100.0000 mg | Freq: Once | INTRAMUSCULAR | Status: AC
  Administered 2021-03-10: 10:00:00 100 mg via INTRAMUSCULAR
  Filled 2021-03-10: qty 2

## 2021-03-10 MED ORDER — CHLORDIAZEPOXIDE HCL 25 MG PO CAPS
25.0000 mg | ORAL_CAPSULE | Freq: Every day | ORAL | Status: AC
  Administered 2021-03-13: 25 mg via ORAL
  Filled 2021-03-10: qty 1

## 2021-03-10 NOTE — TOC Initial Note (Signed)
Transition of Care [facility]) - Initial/Assessment Note    Patient Details  Name: Larry [name] MRN: [id number] Date of Birth: May 17, 1950  Transition of Care [facility]) CM/SW Contact:    Villa Herb, LCSWA Phone Number: 03/10/2021, 2:15 PM  Clinical Narrative:                 TOC made aware PT is recommending SNF for pt. CSW visited pt in room to talk about SNF recommendation. Pt is agreeable to SNF at d/c and referral being sent out. Fl2 has been completed and pts referral has been sent out to local facilities. TOC to follow.   Expected Discharge Plan: Skilled Nursing Facility Barriers to Discharge: Continued Medical Work up   Patient Goals and CMS Choice Patient states their goals for this hospitalization and ongoing recovery are:: Go to SNF CMS Medicare.gov Compare Post Acute Care list provided to:: Patient Choice offered to / list presented to : Patient  Expected Discharge Plan and Services Expected Discharge Plan: Skilled Nursing Facility In-house Referral: Clinical Social Work Discharge Planning Services: CM Consult Post Acute Care Choice: Skilled Nursing Facility Living arrangements for the past 2 months: Mobile Home                                      Prior Living Arrangements/Services Living arrangements for the past 2 months: Mobile Home Lives with:: Self Patient language and need for interpreter reviewed:: Yes Do you feel safe going back to the place where you live?: Yes      Need for Family Participation in Patient Care: Yes (Comment) Care giver support system in place?: Yes (comment)   Criminal Activity/Legal Involvement Pertinent to Current Situation/Hospitalization: No - Comment as needed  Activities of Daily Living Home Assistive Devices/Equipment: Dan Humphreys (specify type) ADL Screening (condition at time of admission) Patient's cognitive ability adequate to safely complete daily activities?: Yes Is the patient deaf or have difficulty hearing?:  No Does the patient have difficulty seeing, even when wearing glasses/contacts?: No Does the patient have difficulty concentrating, remembering, or making decisions?: No Patient able to express need for assistance with ADLs?: Yes Does the patient have difficulty dressing or bathing?: Yes Independently performs ADLs?: Yes (appropriate for developmental age) Does the patient have difficulty walking or climbing stairs?: Yes Weakness of Legs: Both Weakness of Arms/Hands: None  Permission Sought/Granted                  Emotional Assessment Appearance:: Appears stated age Attitude/Demeanor/Rapport: Engaged Affect (typically observed): Accepting Orientation: : Oriented to Self, Oriented to Place, Oriented to  Time, Oriented to Situation Alcohol / Substance Use: Not Applicable Psych Involvement: No (comment)  Admission diagnosis:  Alcohol withdrawal (HCC) [F10.939] Weakness [R53.1] 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] Alcohol withdrawal syndrome with complication (HCC) [F10.939] Patient Active Problem List   Diagnosis Date Noted   Alcohol intoxication (HCC) 03/09/2021   Generalized weakness 03/09/2021   Elevated troponin 03/09/2021   Failure to thrive (child) 03/09/2021   Physical deconditioning 03/09/2021   Elevated AST (SGOT) 03/09/2021   Thrombocytopenia (HCC) 03/09/2021   Elevated MCV 03/09/2021   High anion gap metabolic acidosis 03/09/2021   Dehydration 03/09/2021   Pneumoconiosis, coal, 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   Eye discharge 03/09/2021   Alcohol withdrawal (HCC) 02/06/2021   Falls frequently 02/06/2021   Essential hypertension 02/06/2021   Proximal humerus fracture 09/24/2014   PCP:  Benita Stabile,  MD Pharmacy:   [facility] [place] [place] - [address] [place] [address] Phone: [phone] Fax: [phone]     Social Determinants of Health (SDOH) Interventions    Readmission Risk Interventions Readmission Risk Prevention  Plan 03/10/2021 02/11/2021  Medication Screening Complete -  Transportation Screening Complete Complete  Home Care Screening - Complete  Medication Review (RN CM) - Complete  Some recent data might be hidden

## 2021-03-10 NOTE — Clinical Social Work Note (Signed)
Patient's wife's phone not in service. Patient not able to be reached by room phone for SA consult. TOC to follow.

## 2021-03-10 NOTE — Progress Notes (Signed)
PROGRESS NOTE    Larry [name]  VZD:[id number] DOB: 1950-05-31 DOA: 03/09/2021 PCP: Benita Stabile, MD    Chief Complaint  Patient presents with   Weakness    Brief Narrative: Patient 70 year old gentleman history of chronic alcohol use, hypertension presented to the ED with generalized weakness recurrent falls due to ambulatory difficulty.  Patient with difficulty getting out of bed over the past 4 days due to weakness with decreased oral intake.  Patient noted to have had a fall 4 days prior to admission landing on his right side complain of right-sided chest wall pain and was concerned for rib fractures.  Rib film chest x-ray done negative for any rib fractures or infiltrate.  Patient noted on admission to have some intermittent tachycardia, elevated blood pressure.  Patient admitted placed on the Ativan withdrawal protocol.  PT consulted.   Assessment & Plan:   Principal Problem:   Alcohol withdrawal (HCC) Active Problems:   Falls frequently   Essential hypertension   Alcohol intoxication (HCC)   Generalized weakness   Elevated troponin   Failure to thrive (child)   Physical deconditioning   Elevated AST (SGOT)   Thrombocytopenia (HCC)   Elevated MCV   High anion gap metabolic acidosis   Dehydration   Pneumoconiosis, coal, workers' [facility])   Eye discharge  #1 alcohol intoxication/withdrawal -Patient noted to have alcohol level of 47 on admission.  AST of 57, ALT of 31. -Alcohol cessation stressed to patient. -Improving clinically on the Ativan withdrawal protocol and was started on Librium. -Discontinue current order for Librium and placed on the Librium detox protocol. -Continue Ativan withdrawal protocol in addition, thiamine, multivitamin, folate. -TOC consulted.  2.  High anion gap metabolic acidosis secondary to alcohol/starvation ketoacidosis -Patient noted to have anion gap of 23, bicarb of 18 on admission and hydrated with IV fluids. -Anion gap improving.   Acidosis improved with bicarbonate 26. -IV fluids, supportive care.  3.  Dehydration IV fluids.  4.  Frequent falls due to ambulatory dysfunction secondary to generalized weakness/failure to thrive/physical deconditioning -Continue nutritional supplementation. -Fall precautions. -PT/OT.  5.  Elevated troponin secondary to type II demand ischemia -Troponins seem to be flattened. -Patient asymptomatic denies any chest pain. -Supportive care.  6.  Hypertension Continue lisinopril, Lopressor. -HCTZ held secondary to dehydration.  7.  Elevated MCV -Vitamin B12 696, folate at 7.5. -Hemoglobin stable at 11.5.  8.  Pneumoconiosis -Chest x-ray with calcified granulomas throughout both lungs. -Patient endorsed to admitting physician having coworkers lung disease. -Asymptomatic, stable.  9.  Thrombocytopenia -Likely reactive versus secondary to chronic alcohol use. -No overt bleeding. -Discontinue Lovenox. -Follow.  10.  Eye discharge/? allergic conjunctivitis -Patient had presented white sticky mucus discharge in the right eye. -Continue eyedrops.     DVT prophylaxis: scd Code Status: Full Family Communication: Updated patient.  No family at bedside Disposition:   Status is: Inpatient  Remains inpatient appropriate because: Severity of illness/as safe discharge       Consultants:  None  Procedures:  Chest x-ray with rib films 03/09/2021 Plain films of the right elbow 03/09/2021  Antimicrobials:  None   Subjective: Sitting up in bed.  Overall feels a little bit better than on admission.  Still with significant weakness.  Feels tremors has improved.  No nausea or emesis.  No chest pain.  No shortness of breath.  Objective: Vitals:   03/09/21 2130 03/09/21 2253 03/10/21 0212 03/10/21 0552  BP: (!) 156/76 (!) 161/102 133/78 (!) 144/76  Pulse:  96 94 78 71  Resp: 16 18 18 18   Temp: 98 F (36.7 C) 97.8 F (36.6 C) 99.5 F (37.5 C) 97.7 F (36.5 C)   TempSrc:  Oral Oral   SpO2: 100% 100% 98% 97%  Weight:      Height:        Intake/Output Summary (Last 24 hours) at 03/10/2021 1002 Last data filed at 03/10/2021 0900 Gross per 24 hour  Intake 1300 ml  Output 300 ml  Net 1000 ml   Filed Weights   03/09/21 1258  Weight: 61.2 kg    Examination:  General exam: Appears calm and comfortable.  Minimal tremors noted. Respiratory system: Clear to auscultation bilaterally, no wheezes, no crackles, no rhonchi.  Normal respiratory effort.  Speaking in full sentences. Cardiovascular system: Regular rate rhythm no murmurs rubs or gallops.  No JVD.  No lower extremity edema.  Gastrointestinal system: Abdomen is nondistended, soft and nontender. No organomegaly or masses felt. Normal bowel sounds heard. Central nervous system: Alert and oriented. No focal neurological deficits. Extremities: Symmetric 5 x 5 power. Skin: No rashes, lesions or ulcers Psychiatry: Judgement and insight appear normal. Mood & affect appropriate.     Data Reviewed: I have personally reviewed following labs and imaging studies  CBC: Recent Labs  Lab 03/09/21 1400 03/10/21 0538  WBC 5.6 5.2  NEUTROABS 3.6  --   HGB 14.2 11.5*  HCT 41.1 34.1*  MCV 100.5* 101.5*  PLT 110* 85*    Basic Metabolic Panel: Recent Labs  Lab 03/09/21 1400 03/10/21 0538  NA 137 134*  K 4.2 4.1  CL 96* 95*  CO2 18* 26  GLUCOSE 75 90  BUN 17 16  CREATININE 0.66 0.68  CALCIUM 9.1 9.1  MG  --  1.7  PHOS  --  2.6    GFR: Estimated Creatinine Clearance: 74.4 mL/min (by C-G formula based on SCr of 0.68 mg/dL).  Liver Function Tests: Recent Labs  Lab 03/09/21 1400 03/10/21 0538  AST 57* 42*  ALT 31 26  ALKPHOS 88 80  BILITOT 1.4* 2.0*  PROT 7.1 6.3*  ALBUMIN 3.8 3.4*    CBG: Recent Labs  Lab 03/09/21 1426  GLUCAP 72     Recent Results (from the past 240 hour(s))  Resp Panel by RT-PCR (Flu A&B, Covid) Nasopharyngeal Swab     Status: None   Collection  Time: 03/09/21  8:40 PM   Specimen: Nasopharyngeal Swab; Nasopharyngeal(NP) swabs in vial transport medium  Result Value Ref Range Status   SARS Coronavirus 2 by RT PCR NEGATIVE NEGATIVE Final    Comment: (NOTE) SARS-CoV-2 target nucleic acids are NOT DETECTED.  The SARS-CoV-2 RNA is generally detectable in upper respiratory specimens during the acute phase of infection. The lowest concentration of SARS-CoV-2 viral copies this assay can detect is 138 copies/mL. A negative result does not preclude SARS-Cov-2 infection and should not be used as the sole basis for treatment or other patient management decisions. A negative result may occur with  improper specimen collection/handling, submission of specimen other than nasopharyngeal swab, presence of viral mutation(s) within the areas targeted by this assay, and inadequate number of viral copies(<138 copies/mL). A negative result must be combined with clinical observations, patient history, and epidemiological information. The expected result is Negative.  Fact Sheet for Patients:  03/11/21  Fact Sheet for Healthcare Providers:  BloggerCourse.com  This test is no t yet approved or cleared by the [place] FDA and  has been authorized for detection  and/or diagnosis of SARS-CoV-2 by FDA under an Emergency Use Authorization (EUA). This EUA will remain  in effect (meaning this test can be used) for the duration of the COVID-19 declaration under Section 564(b)(1) of the Act, 21 U.S.C.section 360bbb-3(b)(1), unless the authorization is terminated  or revoked sooner.       Influenza A by PCR NEGATIVE NEGATIVE Final   Influenza B by PCR NEGATIVE NEGATIVE Final    Comment: (NOTE) The Xpert Xpress SARS-CoV-2/FLU/RSV plus assay is intended as an aid in the diagnosis of influenza from Nasopharyngeal swab specimens and should not be used as a sole basis for treatment. Nasal washings  and aspirates are unacceptable for Xpert Xpress SARS-CoV-2/FLU/RSV testing.  Fact Sheet for Patients: BloggerCourse.com  Fact Sheet for Healthcare Providers: SeriousBroker.it  This test is not yet approved or cleared by the [place] FDA and has been authorized for detection and/or diagnosis of SARS-CoV-2 by FDA under an Emergency Use Authorization (EUA). This EUA will remain in effect (meaning this test can be used) for the duration of the COVID-19 declaration under Section 564(b)(1) of the Act, 21 U.S.C. section 360bbb-3(b)(1), unless the authorization is terminated or revoked.  Performed at [facility], [address] [place], [place] [address]          Radiology Studies: DG Ribs Unilateral W/Chest Right  Result Date: 03/09/2021 CLINICAL DATA:  Fall with right chest pain. EXAM: RIGHT RIBS AND CHEST - 3+ VIEW COMPARISON:  Chest x-ray 08/29/2020. FINDINGS: No acute fracture or other bone lesions are seen involving the ribs. There are chronic right 4th-8th rib fractures. ORIF proximal right humeral fracture is unchanged. There is no evidence of pneumothorax or pleural effusion. Numerous bilateral calcified nodular densities and calcified pleural plaques are unchanged. There is no focal lung infiltrate. Heart size and mediastinal contours are within normal limits. IMPRESSION: 1. No acute right rib fractures. 2. No acute cardiopulmonary process. 3. Calcified granulomas throughout both lungs. Electronically Signed   By: Darliss Cheney M.D.   On: 03/09/2021 15:06   DG Elbow Complete Right  Result Date: 03/09/2021 CLINICAL DATA:  Fall. EXAM: RIGHT ELBOW - COMPLETE 3+ VIEW COMPARISON:  None. FINDINGS: There is no evidence of fracture, dislocation, or joint effusion. There is no evidence of arthropathy or other focal bone abnormality. Osteopenia. Soft tissues are unremarkable. IMPRESSION: 1.  No acute osseous abnormality. Electronically  Signed   By: Obie Dredge M.D.   On: 03/09/2021 15:04        Scheduled Meds:  chlordiazePOXIDE  25 mg Oral QID   Followed by   Melene Muller ON 03/11/2021] chlordiazePOXIDE  25 mg Oral TID   Followed by   Melene Muller ON 03/12/2021] chlordiazePOXIDE  25 mg Oral BH-qamhs   Followed by   Melene Muller ON 03/13/2021] chlordiazePOXIDE  25 mg Oral Daily   cholecalciferol  1,000 Units Oral Daily   feeding supplement  237 mL Oral BID BM   folic acid  1 mg Oral Daily   metoprolol tartrate  25 mg Oral BID   multivitamin with minerals  1 tablet Oral Daily   thiamine  100 mg Oral Daily   Or   thiamine  100 mg Intravenous Daily   cyanocobalamin  1,000 mcg Oral Daily   Continuous Infusions:  sodium chloride 100 mL/hr at 03/10/21 0947     LOS: 1 day    Time spent: 35 mins    Ramiro Harvest, MD [facility]   To contact the attending provider between 7A-7P or the  covering provider during after hours 7P-7A, please log into the web site [email or web address] and access using universal [place] password for that web site. If you do not have the password, please call the hospital operator.  03/10/2021, 10:02 AM

## 2021-03-10 NOTE — Evaluation (Addendum)
Physical Therapy Evaluation Patient Details Name: Larry [name] MRN: [id number] DOB: 11-21-1950 Today's Date: 03/10/2021  History of Present Illness  Larry [name] is a 70 y.o. male with medical history significant for chronic alcohol abuse and hypertension who presented to the emergency department via EMS due to generalized weakness and recurrent falls due to ambulatory difficulty.  Patient states that he has not been able to get out of bed without falling since last 4 days due to weakness, he endorsed only little oral intake over this time.  Patient states he had a fall about 4 days ago whereby he landed on his right side and now complain of right-sided chest wall with concern for possible rib fractures.    He was recently admitted and discharged from 10/16-10/24 due to alcohol withdrawal/alcohol intoxication after which he was discharged to a rehab.  Patient left rehab after regaining some strength by signing AMA and has since returned home where he lives in a trailer alone with 6 cats and 1 dog, he has not been taking care of himself.  He activated EMS today due to ongoing weakness, on arrival of EMS team, it was noted that his house was unkempt and feces were noted on the floor.  Patient was taken to the ED for further evaluation and management.  He denies chest pain, fever, chills, headache, nausea, vomiting or abdominal pain.  Last alcohol consumption was more than 24 hours prior to admission due to being too weak to get out of bed and to get to any alcoholic drink.   Clinical Impression  Patient in bed awake, alert, and agreeable for therapy. Patient demonstrated slow labored movement w/ bed mobility, required assistance to sit up, able to scoot to EOB once sitting w/ slow labored movement. Patient attempted sit to stand transfer and transfer from bed to chair w/out RW, demonstrated slow cautious movement, used one hand on bed and the other on bed rail to stand, required increased time and  assistance, unsteady on feet d/t generalized weakness. Patient used RW to ambulate in room, continues to demonstrate slow unsteady cadence, limited to a few steps at bedside d/t generalized weakness and fatigue. Patient left on [facility] after therapy -nursing staff notified. Patient will benefit from continued skilled physical therapy in hospital and recommended venue below to increase strength, balance, endurance for safe ADLs and gait.      Recommendations for follow up therapy are one component of a multi-disciplinary discharge planning process, led by the attending physician.  Recommendations may be updated based on patient status, additional functional criteria and insurance authorization.  Follow Up Recommendations Skilled nursing-short term rehab (<3 hours/day)    Assistance Recommended at Discharge Frequent or constant Supervision/Assistance  Functional Status Assessment Patient has had a recent decline in their functional status and demonstrates the ability to make significant improvements in function in a reasonable and predictable amount of time.  Equipment Recommendations  None recommended by PT    Recommendations for Other Services       Precautions / Restrictions Precautions Precautions: Fall Restrictions Weight Bearing Restrictions: No      Mobility  Bed Mobility Overal bed mobility: Needs Assistance Bed Mobility: Supine to Sit     Supine to sit: Min assist     General bed mobility comments: slow labored movement with min A, able to scoot to EOB on his own once sitting    Transfers Overall transfer level: Needs assistance Equipment used: Rolling walker (2 wheels) Transfers: Sit to/from Stand;Bed  to chair/wheelchair/BSC Sit to Stand: Min guard;Min assist   Step pivot transfers: Min assist;Min guard       General transfer comment: slow labored movement    Ambulation/Gait Ambulation/Gait assistance: Min guard;Min assist Gait Distance (Feet): 4 Feet Assistive  device: Rolling walker (2 wheels) Gait Pattern/deviations: Decreased step length - right;Decreased step length - left;Decreased stride length Gait velocity: Decreased     General Gait Details: slow unsteady movement on feet, fatigues quickly  Stairs            Wheelchair Mobility    Modified Rankin (Stroke Patients Only)       Balance Overall balance assessment: Needs assistance Sitting-balance support: No upper extremity supported;Feet supported Sitting balance-Leahy Scale: Fair Sitting balance - Comments: Fair/good seated at EOB   Standing balance support: Bilateral upper extremity supported;Reliant on assistive device for balance;During functional activity Standing balance-Leahy Scale: Poor Standing balance comment: poor/fair using RW for short period of time d/t weakness and fatigue                             Pertinent Vitals/Pain Pain Assessment: 0-10 Pain Score: 5  Pain Location: R ribs Pain Descriptors / Indicators: Guarding Pain Intervention(s): Limited activity within patient's tolerance;Monitored during session    Home Living Family/patient expects to be discharged to:: Private residence Living Arrangements: Alone Available Help at Discharge: Friend(s);Available PRN/intermittently;Personal care attendant Type of Home: Mobile home Home Access: Stairs to enter Entrance Stairs-Rails: Can reach both;Right;Left Entrance Stairs-Number of Steps: 4   Home Layout: One level Home Equipment: Agricultural consultant (2 wheels) Additional Comments: Per patient report. Pt reports cleaning person comes 1 or 2 times a week.    Prior Function Prior Level of Function : Needs assist             Mobility Comments: Household ambulation with leaning on furniture. ADLs Comments: Assisted by friend for grogery shopping. Cleaner once or twice a week. Independent ADL.     Hand Dominance   Dominant Hand: Right    Extremity/Trunk Assessment   Upper Extremity  Assessment Upper Extremity Assessment: Defer to OT evaluation    Lower Extremity Assessment Lower Extremity Assessment: Generalized weakness       Communication   Communication: No difficulties  Cognition Arousal/Alertness: Awake/alert Behavior During Therapy: WFL for tasks assessed/performed Overall Cognitive Status: Within Functional Limits for tasks assessed                                          General Comments      Exercises     Assessment/Plan    PT Assessment Patient needs continued PT services  PT Problem List Decreased strength;Decreased mobility;Decreased safety awareness;Decreased range of motion;Decreased coordination;Decreased activity tolerance;Decreased balance;Decreased knowledge of use of DME       PT Treatment Interventions DME instruction;Therapeutic exercise;Gait training;Balance training;Stair training;Functional mobility training;Therapeutic activities;Patient/family education    PT Goals (Current goals can be found in the Care Plan section)  Acute Rehab PT Goals Patient Stated Goal: Return home after rehab PT Goal Formulation: With patient Time For Goal Achievement: 03/28/21 Potential to Achieve Goals: Fair    Frequency Min 3X/week   Barriers to discharge        Co-evaluation PT/OT/SLP Co-Evaluation/Treatment: Yes Reason for Co-Treatment: To address functional/ADL transfers PT goals addressed during session: Mobility/safety with mobility  AM-PAC PT "6 Clicks" Mobility  Outcome Measure Help needed turning from your back to your side while in a flat bed without using bedrails?: A Little Help needed moving from lying on your back to sitting on the side of a flat bed without using bedrails?: A Little Help needed moving to and from a bed to a chair (including a wheelchair)?: A Little Help needed standing up from a chair using your arms (e.g., wheelchair or bedside chair)?: A Little Help needed to walk in hospital  room?: A Little Help needed climbing 3-5 steps with a railing? : Total 6 Click Score: 16    End of Session   Activity Tolerance: Patient tolerated treatment well;Patient limited by fatigue Patient left: Other (comment) (Left on BSC next to chair -nurse notified) Nurse Communication: Mobility status PT Visit Diagnosis: Unsteadiness on feet (R26.81);Other abnormalities of gait and mobility (R26.89);Muscle weakness (generalized) (M62.81)    Time: 7564-3329 PT Time Calculation (min) (ACUTE ONLY): 24 min   Charges:   PT Evaluation $PT Eval Moderate Complexity: 1 Mod PT Treatments $Therapeutic Activity: 23-37 mins        Cassie Jones, SPT  During this treatment session, the therapist was present, participating in and directing the treatment.  1:53 PM, 03/10/21 Ocie Bob, MPT Physical Therapist with [facility] [phone] office [phone] mobile phone

## 2021-03-10 NOTE — Progress Notes (Signed)
Notified by CCMD that patient was having "occasional ST elevation." Dr. Janee Morn made aware, EKG obtained. Patient has no c/o chest pain at this time. Call bell in reach.

## 2021-03-10 NOTE — Evaluation (Signed)
Occupational Therapy Evaluation Patient Details Name: Larry [name] MRN: [id number] DOB: [birth year] Today's Date: 03/10/2021   History of Present Illness Larry [name] is a 70 y.o. male with medical history significant for chronic alcohol abuse and hypertension who presented to the emergency department via EMS due to generalized weakness and recurrent falls due to ambulatory difficulty.  Patient states that he has not been able to get out of bed without falling since last 4 days due to weakness, he endorsed only little oral intake over this time.  Patient states he had a fall about 4 days ago whereby he landed on his right side and now complain of right-sided chest wall with concern for possible rib fractures.    He was recently admitted and discharged from 10/16-10/24 due to alcohol withdrawal/alcohol intoxication after which he was discharged to a rehab.  Patient left rehab after regaining some strength by signing AMA and has since returned home where he lives in a trailer alone with 6 cats and 1 dog, he has not been taking care of himself.  He activated EMS today due to ongoing weakness, on arrival of EMS team, it was noted that his house was unkempt and feces were noted on the floor.  Patient was taken to the ED for further evaluation and management.  He denies chest pain, fever, chills, headache, nausea, vomiting or abdominal pain.  Last alcohol consumption was more than 24 hours prior to admission due to being too weak to get out of bed and to get to any alcoholic drink.   Clinical Impression   Pt agreeable to OT/PT co-evaluation. Pt demonstrates limited R UE shoulder use at baseline from previous injuries. Pt required min A for supine to sit bed mobility and Min G to min A for functional ambulation and transfers with RW. Pt demonstrates poor to fair standing balance reliant on RW. Pt also requires min to mod A for lower body dressing as observed with need for assist to don socks. Pt left on potty  chair with nursing notified and call bell within reach. Pt will benefit from continued OT in the hospital and recommended venue below to increase strength, balance, and endurance for safe ADL's.        Recommendations for follow up therapy are one component of a multi-disciplinary discharge planning process, led by the attending physician.  Recommendations may be updated based on patient status, additional functional criteria and insurance authorization.   Follow Up Recommendations  Skilled nursing-short term rehab (<3 hours/day)    Assistance Recommended at Discharge Intermittent Supervision/Assistance  Functional Status Assessment  Patient has had a recent decline in their functional status and demonstrates the ability to make significant improvements in function in a reasonable and predictable amount of time.  Equipment Recommendations  None recommended by OT    Recommendations for Other Services       Precautions / Restrictions Precautions Precautions: Fall Restrictions Weight Bearing Restrictions: No      Mobility Bed Mobility Overal bed mobility: Needs Assistance Bed Mobility: Supine to Sit     Supine to sit: Min assist     General bed mobility comments: slow labored movement with min A    Transfers Overall transfer level: Needs assistance Equipment used: Rolling walker (2 wheels) Transfers: Sit to/from Stand;Bed to chair/wheelchair/BSC Sit to Stand: Min guard;Min assist     Step pivot transfers: Min assist;Min guard     General transfer comment: slow labored movement      Balance Overall  balance assessment: Needs assistance Sitting-balance support: No upper extremity supported;Feet supported Sitting balance-Leahy Scale: Fair Sitting balance - Comments: fair to good seated at EOB   Standing balance support: Bilateral upper extremity supported;Reliant on assistive device for balance;During functional activity Standing balance-Leahy Scale: Poor Standing  balance comment: poor to fair with RW                           ADL either performed or assessed with clinical judgement   ADL Overall ADL's : Needs assistance/impaired                     Lower Body Dressing: Minimal assistance;Moderate assistance;Sitting/lateral leans Lower Body Dressing Details (indicate cue type and reason): Pt required assist to start socks on B LE. Able to pull up socks once started. Toilet Transfer: Min guard;Minimal assistance;Stand-pivot;Rolling walker (2 wheels) Toilet Transfer Details (indicate cue type and reason): chair to bsc with use of RW         Functional mobility during ADLs: Min guard;Minimal assistance;Rolling walker (2 wheels) General ADL Comments: Slow labored movement during functional ambulation and transfers.     Vision Baseline Vision/History: 1 Wears glasses Ability to See in Adequate Light: 0 Adequate Patient Visual Report: No change from baseline (R eye runs.) Vision Assessment?: No apparent visual deficits                Pertinent Vitals/Pain Pain Assessment: 0-10 Pain Score: 5  Pain Location: R ribs Pain Descriptors / Indicators: Guarding Pain Intervention(s): Limited activity within patient's tolerance;Monitored during session;Repositioned     Hand Dominance Right   Extremity/Trunk Assessment Upper Extremity Assessment Upper Extremity Assessment: RUE deficits/detail;LUE deficits/detail RUE Deficits / Details: 2+/5 shoulder MMT; limited to ~90* P/ROM shoulder abduction and flexion; Generally weak elbow and grip strength. LUE Deficits / Details: 3-/5 MMT shoulder flexion; WFL P/ROM flexion and abduction. Generally weak.           Communication Communication Communication: No difficulties   Cognition Arousal/Alertness: Awake/alert Behavior During Therapy: WFL for tasks assessed/performed Overall Cognitive Status: Within Functional Limits for tasks assessed                                                   Home Living Family/patient expects to be discharged to:: Private residence Living Arrangements: Alone Available Help at Discharge: Friend(s);Available PRN/intermittently;Personal care attendant Type of Home: Mobile home Home Access: Stairs to enter Entrance Stairs-Number of Steps: 4 Entrance Stairs-Rails: Can reach both;Right;Left Home Layout: One level     Bathroom Shower/Tub: Chief Strategy Officer: Standard     Home Equipment: Agricultural consultant (2 wheels)          Prior Functioning/Environment Prior Level of Function : Needs assist             Mobility Comments: Household ambulation with leaning on furniture. ADLs Comments: Assisted by friend for grogery shopping. Cleaner once or twice a week. Independent ADL.        OT Problem List: Decreased strength;Decreased activity tolerance;Decreased range of motion;Impaired balance (sitting and/or standing);Decreased coordination      OT Treatment/Interventions: Self-care/ADL training;Therapeutic exercise;Therapeutic activities;Patient/family education;Balance training    OT Goals(Current goals can be found in the care plan section) Acute Rehab OT Goals Patient Stated Goal: return home OT Goal  Formulation: With patient Time For Goal Achievement: 03/24/21 Potential to Achieve Goals: Fair  OT Frequency: Min 2X/week               Co-evaluation PT/OT/SLP Co-Evaluation/Treatment: Yes Reason for Co-Treatment: To address functional/ADL transfers PT goals addressed during session: Mobility/safety with mobility OT goals addressed during session: ADL's and self-care                       End of Session Equipment Utilized During Treatment: Rolling walker (2 wheels) Nurse Communication: Other (comment) (Informed that pt was on the [facility] with instructions to use the call bell to get nursing to assist once finished.)  Activity Tolerance: Patient tolerated treatment well Patient  left: Other (comment) (Left on BSC with call bell within reach.)  OT Visit Diagnosis: Unsteadiness on feet (R26.81);Other abnormalities of gait and mobility (R26.89);Muscle weakness (generalized) (M62.81)                Time: 0109-3235 OT Time Calculation (min): 23 min Charges:  OT General Charges $OT Visit: 1 Visit OT Evaluation $OT Eval Low Complexity: 1 Low  Talma Aguillard OT, MOT  Danie Chandler 03/10/2021, 11:31 AM

## 2021-03-10 NOTE — Progress Notes (Signed)
Initial Nutrition Assessment  DOCUMENTATION CODES:   Severe malnutrition in context of social or environmental circumstances  INTERVENTION:  Ensure Enlive po BID, each supplement provides 350 kcal and 20 grams of protein   Vitamin therapy   NUTRITION DIAGNOSIS:   Severe Malnutrition related to social / environmental circumstances (ETOH abuse) as evidenced by severe fat depletion, severe muscle depletion.   GOAL:  Patient will meet greater than or equal to 90% of their needs   MONITOR:  PO intake, Supplement acceptance, Labs, Skin, Weight trends  REASON FOR ASSESSMENT:   Malnutrition Screening Tool    ASSESSMENT: Patient is an underweight 70 yo male with history of ETOH abuse, falls and hypertension. Hospitalized last month associated with alcohol withdrawal per MD.   Patient ate 100% of breakfast this morning. Patient says he had not eaten in 5 days prior to admission because of being too weak to get out of bed. Poor living conditions per chart review. Able to feed himself today. Patient says he is feeling better now that he has eaten and had something to drink. He likes Ensure and expressed appreciation for all that is being done to help him.  Medications reviewed and include: folvite, Vit D3, MVI, Vit B-12 and thiamine.  IVF- NS@ 100 ml/hr   Labs: BMP Latest Ref Rng & Units 03/10/2021 03/09/2021 02/13/2021  Glucose 70 - 99 mg/dL 90 75 95  BUN 8 - 23 mg/dL 16 17 10   Creatinine 0.61 - 1.24 mg/dL 9.50 9.32  Sodium 135 - 145 mmol/L 134(L) 137 136  Potassium 3.5 - 5.1 mmol/L 4.1 4.2 3.6  Chloride 98 - 111 mmol/L 95(L) 96(L) 104  CO2 22 - 32 mmol/L 26 18(L) 26  Calcium 8.9 - 10.3 mg/dL 9.1 9.1 6.71)     NUTRITION - FOCUSED PHYSICAL EXAM:  Flowsheet Row Most Recent Value  Orbital Region Severe depletion  Upper Arm Region Severe depletion  Thoracic and Lumbar Region Moderate depletion  Buccal Region Mild depletion  Temple Region Moderate depletion  Clavicle  Bone Region Moderate depletion  Clavicle and Acromion Bone Region Severe depletion  Scapular Bone Region Mild depletion  Dorsal Hand No depletion  Patellar Region No depletion  Anterior Thigh Region Moderate depletion  Posterior Calf Region Severe depletion  Edema (RD Assessment) None  Hair Reviewed  [balding]  Eyes Reviewed  [draining clear liquid bilaterally]  Mouth Reviewed  Skin Reviewed  [dry, bruising to right forearm]  Nails Reviewed       Diet Order:   Diet Order             Diet regular Room service appropriate? Yes; Fluid consistency: Thin  Diet effective now                   EDUCATION NEEDS:  Education needs have been addressed  Skin:  Skin Assessment: Reviewed RN Assessment  Last BM:  11/17- small  Height:   Ht Readings from Last 1 Encounters:  03/09/21 6\' 2"  (1.88 m)    Weight:   Wt Readings from Last 1 Encounters:  03/09/21 61.2 kg    Ideal Body Weight:   86 kg  BMI:  Body mass index is 17.33 kg/m.  Estimated Nutritional Needs:   Kcal:  2142-2325   Protein:  91-104 gr  Fluid:  1800 ml daily   03/11/21 MS,RD,CSG,LDN Contact: 02-02-2001

## 2021-03-10 NOTE — [place] FL2 (Signed)
[place] MEDICAID FL2 LEVEL OF CARE SCREENING TOOL     IDENTIFICATION  Patient Name: Larry [name] Birthdate: 1950-06-01 Sex: male Admission Date (Current Location): 03/09/2021  [facility] and [place] Number:  [employer] and Address:  [facility],  [address], [place] [address]      Provider Number: [id number]  Attending Physician Name and Address:  Rodolph Bong, MD  Relative Name and Phone Number:       Current Level of Care: Hospital Recommended Level of Care: Skilled Nursing Facility Prior Approval Number:    Date Approved/Denied:   PASRR Number: [id number]  Discharge Plan: SNF    Current Diagnoses: Patient Active Problem List   Diagnosis Date Noted   Alcohol intoxication (HCC) 03/09/2021   Generalized weakness 03/09/2021   Elevated troponin 03/09/2021   Failure to thrive (child) 03/09/2021   Physical deconditioning 03/09/2021   Elevated AST (SGOT) 03/09/2021   Thrombocytopenia (HCC) 03/09/2021   Elevated MCV 03/09/2021   High anion gap metabolic acidosis 03/09/2021   Dehydration 03/09/2021   Pneumoconiosis, coal, 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   Eye discharge 03/09/2021   Alcohol withdrawal (HCC) 02/06/2021   Falls frequently 02/06/2021   Essential hypertension 02/06/2021   Proximal humerus fracture 09/24/2014    Orientation RESPIRATION BLADDER Height & Weight        Normal Continent, Indwelling catheter Weight: 135 lb (61.2 kg) Height:  6\' 2"  (188 cm)  BEHAVIORAL SYMPTOMS/MOOD NEUROLOGICAL BOWEL NUTRITION STATUS      Continent Diet (See D/C summary)  AMBULATORY STATUS COMMUNICATION OF NEEDS Skin   Extensive Assist Verbally Normal                       Personal Care Assistance Level of Assistance  Bathing, Feeding, Dressing, Total care Bathing Assistance: Limited assistance Feeding assistance: Independent Dressing Assistance: Limited assistance Total Care Assistance: Limited assistance   Functional  Limitations Info  Sight, Hearing, Speech Sight Info: Impaired Hearing Info: Impaired Speech Info: Adequate    SPECIAL CARE FACTORS FREQUENCY  PT (By licensed PT), OT (By licensed OT)     PT Frequency: 5 times weekly OT Frequency: 5 times weekly            Contractures Contractures Info: Not present    Additional Factors Info  Code Status, Allergies Code Status Info: FULL Allergies Info: NKA           Current Medications (03/10/2021):  This is the current hospital active medication list Current Facility-Administered Medications  Medication Dose Route Frequency Provider Last Rate Last Admin   0.9 %  sodium chloride infusion   Intravenous Continuous 03/12/2021, MD 100 mL/hr at 03/10/21 0947 New Bag at 03/10/21 0947   acetaminophen (TYLENOL) tablet 650 mg  650 mg Oral Q6H PRN Adefeso, Oladapo, DO       chlordiazePOXIDE (LIBRIUM) capsule 25 mg  25 mg Oral Q6H PRN 03/12/21, MD       chlordiazePOXIDE (LIBRIUM) capsule 25 mg  25 mg Oral QID Rodolph Bong, MD   25 mg at 03/10/21 1343   Followed by   03/12/21 ON 03/11/2021] chlordiazePOXIDE (LIBRIUM) capsule 25 mg  25 mg Oral TID 03/13/2021, MD       Followed by   Rodolph Bong ON 03/12/2021] chlordiazePOXIDE (LIBRIUM) capsule 25 mg  25 mg Oral 03/14/2021, MD       Followed by   Roe Coombs ON 03/13/2021] chlordiazePOXIDE (LIBRIUM) capsule  25 mg  25 mg Oral Daily Rodolph Bong, MD       cholecalciferol (VITAMIN D3) tablet 1,000 Units  1,000 Units Oral Daily Adefeso, Oladapo, DO   1,000 Units at 03/10/21 0940   feeding supplement (ENSURE ENLIVE / ENSURE PLUS) liquid 237 mL  237 mL Oral BID BM Adefeso, Oladapo, DO       folic acid (FOLVITE) tablet 1 mg  1 mg Oral Daily Adefeso, Oladapo, DO   1 mg at 03/10/21 0940   hydrOXYzine (ATARAX/VISTARIL) tablet 25 mg  25 mg Oral Q6H PRN Rodolph Bong, MD       loperamide (IMODIUM) capsule 2-4 mg  2-4 mg Oral PRN Rodolph Bong, MD        LORazepam (ATIVAN) tablet 1-4 mg  1-4 mg Oral Q1H PRN Adefeso, Oladapo, DO   2 mg at 03/10/21 1210   Or   LORazepam (ATIVAN) injection 1-4 mg  1-4 mg Intravenous Q1H PRN Adefeso, Oladapo, DO       metoprolol tartrate (LOPRESSOR) tablet 25 mg  25 mg Oral BID Adefeso, Oladapo, DO   25 mg at 03/10/21 0940   multivitamin with minerals tablet 1 tablet  1 tablet Oral Daily Adefeso, Oladapo, DO   1 tablet at 03/10/21 2811   Naphazoline-Glycerin 0.03-0.5 % SOLN 1-2 drop  1-2 drop Right Eye QID PRN Adefeso, Oladapo, DO       ondansetron (ZOFRAN-ODT) disintegrating tablet 4 mg  4 mg Oral Q6H PRN Rodolph Bong, MD       thiamine tablet 100 mg  100 mg Oral Daily Adefeso, Oladapo, DO   100 mg at 03/10/21 8867   Or   thiamine (B-1) injection 100 mg  100 mg Intravenous Daily Adefeso, Oladapo, DO       vitamin B-12 (CYANOCOBALAMIN) tablet 1,000 mcg  1,000 mcg Oral Daily Adefeso, Oladapo, DO   1,000 mcg at 03/10/21 7373     Discharge Medications: Please see discharge summary for a list of discharge medications.  Relevant Imaging Results:  Relevant Lab Results:   Additional Information SS# [id number]  [place], [place]

## 2021-03-10 NOTE — Plan of Care (Signed)
  Problem: Acute Rehab OT Goals (only OT should resolve) Goal: Pt. Will Perform Grooming Flowsheets (Taken 03/10/2021 1133) Pt Will Perform Grooming:  with modified independence  standing  with adaptive equipment Goal: Pt. Will Perform Lower Body Dressing Flowsheets (Taken 03/10/2021 1133) Pt Will Perform Lower Body Dressing:  sitting/lateral leans  with modified independence  with adaptive equipment Goal: Pt. Will Transfer To Toilet Flowsheets (Taken 03/10/2021 1133) Pt Will Transfer to Toilet:  with modified independence  with supervision  stand pivot transfer Goal: Pt/Caregiver Will Perform Home Exercise Program Flowsheets (Taken 03/10/2021 1133) Pt/caregiver will Perform Home Exercise Program:  Increased ROM  Increased strength  Right Upper extremity  Left upper extremity  Independently  Ngoc Detjen OT, MOT

## 2021-03-10 NOTE — Plan of Care (Addendum)
  Problem: Acute Rehab PT Goals(only PT should resolve) Goal: Pt Will Go Supine/Side To Sit Outcome: Progressing Flowsheets (Taken 03/10/2021 1154) Pt will go Supine/Side to Sit: with supervision Goal: Patient Will Transfer Sit To/From Stand Outcome: Progressing Flowsheets (Taken 03/10/2021 1154) Patient will transfer sit to/from stand:  with supervision  with min guard assist Goal: Pt Will Transfer Bed To Chair/Chair To Bed Outcome: Progressing Flowsheets (Taken 03/10/2021 1154) Pt will Transfer Bed to Chair/Chair to Bed:  with supervision  min guard assist Goal: Pt Will Ambulate Outcome: Progressing Flowsheets (Taken 03/10/2021 1154) Pt will Ambulate:  10 feet  with rolling walker  with min guard assist  with supervision    Phyllis Ginger, SPT  During this treatment session, the therapist was present, participating in and directing the treatment.  1:55 PM, 03/10/21 Ocie Bob, MPT Physical Therapist with [facility] [phone] office [phone] mobile phone

## 2021-03-11 DIAGNOSIS — F10930 Alcohol use, unspecified with withdrawal, uncomplicated: Secondary | ICD-10-CM | POA: Diagnosis not present

## 2021-03-11 LAB — COMPREHENSIVE METABOLIC PANEL
ALT: 20 U/L (ref 0–44)
AST: 32 U/L (ref 15–41)
Albumin: 3 g/dL — ABNORMAL LOW (ref 3.5–5.0)
Alkaline Phosphatase: 85 U/L (ref 38–126)
Anion gap: 9 (ref 5–15)
BUN: 14 mg/dL (ref 8–23)
CO2: 27 mmol/L (ref 22–32)
Calcium: 8.8 mg/dL — ABNORMAL LOW (ref 8.9–10.3)
Chloride: 102 mmol/L (ref 98–111)
Creatinine, Ser: 0.57 mg/dL — ABNORMAL LOW (ref 0.61–1.24)
GFR, Estimated: >60 mL/min (ref >60)
Glucose, Bld: 100 mg/dL — ABNORMAL HIGH (ref 70–99)
Potassium: 3.3 mmol/L — ABNORMAL LOW (ref 3.5–5.1)
Sodium: 138 mmol/L (ref 135–145)
Total Bilirubin: 1 mg/dL (ref 0.3–1.2)
Total Protein: 5.6 g/dL — ABNORMAL LOW (ref 6.5–8.1)

## 2021-03-11 LAB — URINALYSIS, ROUTINE W REFLEX MICROSCOPIC
Bilirubin Urine: NEGATIVE
Glucose, UA: NEGATIVE mg/dL
Hgb urine dipstick: NEGATIVE
Ketones, ur: 5 mg/dL — AB
Leukocytes,Ua: NEGATIVE
Nitrite: NEGATIVE
Protein, ur: NEGATIVE mg/dL
Specific Gravity, Urine: 1.016 (ref 1.005–1.030)
pH: 6 (ref 5.0–8.0)

## 2021-03-11 LAB — CBC WITH DIFFERENTIAL/PLATELET
Abs Immature Granulocytes: 0.01 10*3/uL (ref 0.00–0.07)
Basophils Absolute: 0 10*3/uL (ref 0.0–0.1)
Basophils Relative: 1 %
Eosinophils Absolute: 0.1 10*3/uL (ref 0.0–0.5)
Eosinophils Relative: 3 %
HCT: 33.9 % — ABNORMAL LOW (ref 39.0–52.0)
Hemoglobin: 11.5 g/dL — ABNORMAL LOW (ref 13.0–17.0)
Immature Granulocytes: 0 %
Lymphocytes Relative: 36 %
Lymphs Abs: 1.5 10*3/uL (ref 0.7–4.0)
MCH: 33.8 pg (ref 26.0–34.0)
MCHC: 33.9 g/dL (ref 30.0–36.0)
MCV: 99.7 fL (ref 80.0–100.0)
Monocytes Absolute: 0.4 10*3/uL (ref 0.1–1.0)
Monocytes Relative: 9 %
Neutro Abs: 2.3 10*3/uL (ref 1.7–7.7)
Neutrophils Relative %: 51 %
Platelets: 60 10*3/uL — ABNORMAL LOW (ref 150–400)
RBC: 3.4 MIL/uL — ABNORMAL LOW (ref 4.22–5.81)
RDW: 12.8 % (ref 11.5–15.5)
WBC: 4.3 10*3/uL (ref 4.0–10.5)
nRBC: 0 % (ref 0.0–0.2)

## 2021-03-11 LAB — MAGNESIUM: Magnesium: 1.6 mg/dL — ABNORMAL LOW (ref 1.7–2.4)

## 2021-03-11 MED ORDER — POTASSIUM CHLORIDE CRYS ER 20 MEQ PO TBCR
40.0000 meq | EXTENDED_RELEASE_TABLET | Freq: Once | ORAL | Status: AC
  Administered 2021-03-11: 40 meq via ORAL
  Filled 2021-03-11: qty 2

## 2021-03-11 MED ORDER — MAGNESIUM SULFATE 2 GM/50ML IV SOLN
2.0000 g | Freq: Once | INTRAVENOUS | Status: AC
  Administered 2021-03-11: 2 g via INTRAVENOUS
  Filled 2021-03-11: qty 50

## 2021-03-11 NOTE — Progress Notes (Signed)
Physical Therapy Treatment Patient Details Name: Larry [name] MRN: [id number] DOB: Jun 23, 1950 Today's Date: 03/11/2021   History of Present Illness Larry [name] is a 70 y.o. male with medical history significant for chronic alcohol abuse and hypertension who presented to the emergency department via EMS due to generalized weakness and recurrent falls due to ambulatory difficulty.  Patient states that he has not been able to get out of bed without falling since last 4 days due to weakness, he endorsed only little oral intake over this time.  Patient states he had a fall about 4 days ago whereby he landed on his right side and now complain of right-sided chest wall with concern for possible rib fractures.    He was recently admitted and discharged from 10/16-10/24 due to alcohol withdrawal/alcohol intoxication after which he was discharged to a rehab.  Patient left rehab after regaining some strength by signing AMA and has since returned home where he lives in a trailer alone with 6 cats and 1 dog, he has not been taking care of himself.  He activated EMS today due to ongoing weakness, on arrival of EMS team, it was noted that his house was unkempt and feces were noted on the floor.  Patient was taken to the ED for further evaluation and management.  He denies chest pain, fever, chills, headache, nausea, vomiting or abdominal pain.  Last alcohol consumption was more than 24 hours prior to admission due to being too weak to get out of bed and to get to any alcoholic drink.    PT Comments    Patient completes all mobility today with slow, labored movements and requires min assist secondary to weakness and impaired balance. Patient able to ambulate to chair at beside with RW and assist but requests to return to bed due to fatigue. Patient assisted back to bed to end session. Patient will benefit from continued physical therapy in hospital and recommended venue below to increase strength, balance,  endurance for safe ADLs and gait.    Recommendations for follow up therapy are one component of a multi-disciplinary discharge planning process, led by the attending physician.  Recommendations may be updated based on patient status, additional functional criteria and insurance authorization.  Follow Up Recommendations  Skilled nursing-short term rehab (<3 hours/day)     Assistance Recommended at Discharge Frequent or constant Supervision/Assistance  Equipment Recommendations  None recommended by PT    Recommendations for Other Services       Precautions / Restrictions Precautions Precautions: Fall Restrictions Weight Bearing Restrictions: No     Mobility  Bed Mobility Overal bed mobility: Needs Assistance Bed Mobility: Supine to Sit;Sit to Supine     Supine to sit: Min assist Sit to supine: Min assist   General bed mobility comments: slow labored movement with min A, able to scoot to EOB on his own once sitting    Transfers Overall transfer level: Needs assistance Equipment used: Rolling walker (2 wheels) Transfers: Sit to/from Stand;Bed to chair/wheelchair/BSC Sit to Stand: Min assist Stand pivot transfers: Min assist   Step pivot transfers: Min assist     General transfer comment: slow labored movement, cueing for sequencing    Ambulation/Gait Ambulation/Gait assistance: Min assist Gait Distance (Feet): 6 Feet Assistive device: Rolling walker (2 wheels) Gait Pattern/deviations: Decreased step length - right;Decreased step length - left;Decreased stride length Gait velocity: Decreased     General Gait Details: slow, labored, unsteady cadence to/from chair at bedside   Stairs  Wheelchair Mobility    Modified Rankin (Stroke Patients Only)       Balance Overall balance assessment: Needs assistance Sitting-balance support: No upper extremity supported;Feet supported Sitting balance-Leahy Scale: Fair Sitting balance - Comments:  Fair/good seated at EOB   Standing balance support: Bilateral upper extremity supported;Reliant on assistive device for balance;During functional activity Standing balance-Leahy Scale: Poor Standing balance comment: poor/fair using RW for short period of time d/t weakness and fatigue                            Cognition Arousal/Alertness: Awake/alert Behavior During Therapy: WFL for tasks assessed/performed Overall Cognitive Status: Within Functional Limits for tasks assessed                                          Exercises General Exercises - Lower Extremity Long Arc Quad: AROM;Both;10 reps;Seated Hip Flexion/Marching: AROM;Both;5 reps;Seated    General Comments        Pertinent Vitals/Pain Pain Score: 5  Pain Location: R ribs Pain Descriptors / Indicators: Guarding Pain Intervention(s): Limited activity within patient's tolerance;Monitored during session    Home Living                          Prior Function            PT Goals (current goals can now be found in the care plan section) Acute Rehab PT Goals Patient Stated Goal: Return home after rehab PT Goal Formulation: With patient Time For Goal Achievement: 03/28/21 Potential to Achieve Goals: Fair Progress towards PT goals: Progressing toward goals    Frequency    Min 3X/week      PT Plan Current plan remains appropriate    Co-evaluation              AM-PAC PT "6 Clicks" Mobility   Outcome Measure  Help needed turning from your back to your side while in a flat bed without using bedrails?: A Little Help needed moving from lying on your back to sitting on the side of a flat bed without using bedrails?: A Little Help needed moving to and from a bed to a chair (including a wheelchair)?: A Lot Help needed standing up from a chair using your arms (e.g., wheelchair or bedside chair)?: A Lot Help needed to walk in hospital room?: A Lot Help needed climbing 3-5  steps with a railing? : Total 6 Click Score: 13    End of Session Equipment Utilized During Treatment: Gait belt Activity Tolerance: Patient tolerated treatment well;Patient limited by fatigue Patient left: in bed;with call bell/phone within reach;with bed alarm set Nurse Communication: Mobility status PT Visit Diagnosis: Unsteadiness on feet (R26.81);Other abnormalities of gait and mobility (R26.89);Muscle weakness (generalized) (M62.81)     Time: 3202-3343 PT Time Calculation (min) (ACUTE ONLY): 29 min  Charges:  $Therapeutic Exercise: 8-22 mins $Therapeutic Activity: 8-22 mins                     10:03 AM, 03/11/21 Wyman Songster PT, DPT Physical Therapist at [facility]

## 2021-03-11 NOTE — Progress Notes (Signed)
PROGRESS NOTE    Larry [name]  WUX:[id number] DOB: March 22, 1951 DOA: 03/09/2021 PCP: Benita Stabile, MD   Brief Narrative:   Patient 70 year old gentleman history of chronic alcohol use, hypertension presented to the ED with generalized weakness recurrent falls due to ambulatory difficulty.  Patient with difficulty getting out of bed over the past 4 days due to weakness with decreased oral intake.  Patient noted to have had a fall 4 days prior to admission landing on his right side complain of right-sided chest wall pain and was concerned for rib fractures.  Rib film chest x-ray done negative for any rib fractures or infiltrate.  Patient noted on admission to have some intermittent tachycardia, elevated blood pressure.  Patient admitted placed on the Ativan withdrawal protocol and is now stable on Librium taper.  PT consulted-with recommendations for SNF/rehab on discharge.  Currently awaiting placement.  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  AST of 57, ALT of 31. -Alcohol cessation stressed to patient. -Improving clinically on the Ativan withdrawal protocol and was started on Librium. -Discontinue current order for Librium and placed on the Librium detox protocol. -Continue Ativan withdrawal protocol in addition, thiamine, multivitamin, folate. -TOC consulted with placement pending  2.  High anion gap metabolic acidosis secondary to alcohol/starvation  ketoacidosis -Patient noted to have anion gap of 23, bicarb of 18 on admission and hydrated with IV fluids. -Anion gap improving.  Acidosis improved with bicarbonate 26. -IV fluids, supportive care.  3.  Dehydration IV fluids can now be dcd Diet tolerated  4.  Frequent falls due to ambulatory dysfunction secondary to generalized weakness/failure to thrive/physical deconditioning -Continue nutritional supplementation. -Fall precautions. -PT/OT.  5.  Elevated troponin secondary to type II demand ischemia -Troponins seem to be flattened. -Patient asymptomatic denies any chest pain. -Supportive care.  6.  Hypertension-stable Continue lisinopril, Lopressor. -HCTZ held secondary to dehydration.  7.  Elevated MCV -Vitamin B12 696, folate at 7.5. -Hemoglobin stable at 11.5.  8.  Pneumoconiosis -Chest x-ray with calcified granulomas throughout both lungs. -Patient endorsed to admitting physician having coworkers lung disease. -Asymptomatic, stable.  9.  Thrombocytopenia -Likely reactive versus secondary to chronic alcohol use. -No overt bleeding. -Discontinue Lovenox. -Follow.  10.  Eye discharge/? allergic conjunctivitis -Patient had presented white sticky mucus discharge in the right eye. -Continue eyedrops.     DVT prophylaxis: scd Code Status: Full Family Communication: Updated patient.  No family at bedside Disposition:    Status is: Inpatient   Remains inpatient appropriate because: Severity of illness/as safe discharge     Consultants:  None   Procedures:  Chest x-ray with rib films 03/09/2021 Plain films of the right elbow 03/09/2021   Antimicrobials:  None  Subjective: Patient seen and evaluated today with no new acute complaints or concerns. No acute concerns or events noted overnight. Eating breakfast this am.  Objective: Vitals:   03/10/21 1322 03/10/21 2357 03/11/21 0517 03/11/21 1004  BP: (!) 102/59 126/78 135/78  131/66  Pulse: 77 77 78 93   Resp:  18 19 20   Temp: 99.1 F (37.3 C) 98.1 F (36.7 C) 99.4 F (37.4 C)   TempSrc: Oral     SpO2: 98% 100% 97% 98%  Weight:      Height:        Intake/Output Summary (Last 24 hours) at 03/11/2021 1120 Last data filed at 03/11/2021 0500 Gross per 24 hour  Intake 2190.09 ml  Output 1250 ml  Net 940.09 ml   Filed Weights   03/09/21 1258  Weight: 61.2 kg    Examination:  General exam: Appears calm and comfortable  Respiratory system: Clear to auscultation. Respiratory effort normal. Cardiovascular system: S1 & S2 heard, RRR.  Gastrointestinal system: Abdomen is soft Central nervous system: Alert and awake Extremities: No edema Skin: No significant lesions noted Psychiatry: Flat affect.    Data Reviewed: I have personally reviewed following labs and imaging studies  CBC: Recent Labs  Lab 03/09/21 1400 03/10/21 0538 03/11/21 0518  WBC 5.6 5.2 4.3  NEUTROABS 3.6  --  2.3  HGB 14.2 11.5* 11.5*  HCT 41.1 34.1* 33.9*  MCV 100.5* 101.5* 99.7  PLT 110* 85* 60*   Basic Metabolic Panel: Recent Labs  Lab 03/09/21 1400 03/10/21 0538 03/11/21 0518  NA 137 134* 138  K 4.2 4.1 3.3*  CL 96* 95* 102  CO2 18* 26 27  GLUCOSE 75 90 100*  BUN 17 16 14   CREATININE 0.66 0.68 03/13/21*  CALCIUM 9.1 9.1 8.8*  MG  --  1.7 1.6*  PHOS  --  2.6  --    GFR: Estimated Creatinine Clearance: 74.4 mL/min (A) (by C-G formula based on SCr of 0.57 mg/dL (L)). Liver Function Tests: Recent Labs  Lab 03/09/21 1400 03/10/21 0538 03/11/21 0518  AST 57* 42* 32  ALT 31 26 20   ALKPHOS 88 80 85  BILITOT 1.4* 2.0* 1.0  PROT 7.1 6.3* 5.6*  ALBUMIN 3.8 3.4* 3.0*   No results for input(s): LIPASE, AMYLASE in the last 168 hours. No results for input(s): AMMONIA in the last 168 hours. Coagulation Profile: Recent Labs  Lab 03/10/21 0538  INR 1.0   Cardiac Enzymes: No results for input(s): CKTOTAL, CKMB, CKMBINDEX, TROPONINI in the last 168 hours. BNP (last 3 results) No results  for input(s): PROBNP in the last 8760 hours. HbA1C: No results for input(s): HGBA1C in the last 72 hours. CBG: Recent Labs  Lab 03/09/21 1426  GLUCAP 72   Lipid Profile: No results for input(s): CHOL, HDL, LDLCALC, TRIG, CHOLHDL, LDLDIRECT in the last 72 hours. Thyroid Function Tests: No results for input(s): TSH, T4TOTAL, FREET4, T3FREE, THYROIDAB in the last 72 hours. Anemia Panel: Recent Labs    03/10/21 0538  VITAMINB12 696  FOLATE 7.5   Sepsis Labs: No results for input(s): PROCALCITON, LATICACIDVEN in the last 168 hours.  Recent Results (from the past 240 hour(s))  Resp Panel by RT-PCR (Flu A&B, Covid) Nasopharyngeal Swab     Status: None   Collection Time: 03/09/21  8:40 PM   Specimen: Nasopharyngeal Swab; Nasopharyngeal(NP) swabs in vial transport medium  Result Value Ref Range Status   SARS Coronavirus 2 by RT PCR NEGATIVE NEGATIVE Final    Comment: (NOTE) SARS-CoV-2 target nucleic acids are NOT DETECTED.  The SARS-CoV-2 RNA is generally detectable in upper respiratory specimens during the acute phase of infection. The lowest concentration of SARS-CoV-2 viral copies this assay can detect is 138 copies/mL. A negative result does not  preclude SARS-Cov-2 infection and should not be used as the sole basis for treatment or other patient management decisions. A negative result may occur with  improper specimen collection/handling, submission of specimen other than nasopharyngeal swab, presence of viral mutation(s) within the areas targeted by this assay, and inadequate number of viral copies(<138 copies/mL). A negative result must be combined with clinical observations, patient history, and epidemiological information. The expected result is Negative.  Fact Sheet for Patients:  BloggerCourse.com  Fact Sheet for Healthcare Providers:  SeriousBroker.it  This test is no t yet approved or cleared by the [place]  FDA and  has been authorized for detection and/or diagnosis of SARS-CoV-2 by FDA under an Emergency Use Authorization (EUA). This EUA will remain  in effect (meaning this test can be used) for the duration of the COVID-19 declaration under Section 564(b)(1) of the Act, 21 U.S.C.section 360bbb-3(b)(1), unless the authorization is terminated  or revoked sooner.       Influenza A by PCR NEGATIVE NEGATIVE Final   Influenza B by PCR NEGATIVE NEGATIVE Final    Comment: (NOTE) The Xpert Xpress SARS-CoV-2/FLU/RSV plus assay is intended as an aid in the diagnosis of influenza from Nasopharyngeal swab specimens and should not be used as a sole basis for treatment. Nasal washings and aspirates are unacceptable for Xpert Xpress SARS-CoV-2/FLU/RSV testing.  Fact Sheet for Patients: BloggerCourse.com  Fact Sheet for Healthcare Providers: SeriousBroker.it  This test is not yet approved or cleared by the [place] FDA and has been authorized for detection and/or diagnosis of SARS-CoV-2 by FDA under an Emergency Use Authorization (EUA). This EUA will remain in effect (meaning this test can be used) for the duration of the COVID-19 declaration under Section 564(b)(1) of the Act, 21 U.S.C. section 360bbb-3(b)(1), unless the authorization is terminated or revoked.  Performed at [facility], [address], [place], [place] [address]          Radiology Studies: DG Ribs Unilateral W/Chest Right  Result Date: 03/09/2021 CLINICAL DATA:  Fall with right chest pain. EXAM: RIGHT RIBS AND CHEST - 3+ VIEW COMPARISON:  Chest x-ray 08/29/2020. FINDINGS: No acute fracture or other bone lesions are seen involving the ribs. There are chronic right 4th-8th rib fractures. ORIF proximal right humeral fracture is unchanged. There is no evidence of pneumothorax or pleural effusion. Numerous bilateral calcified nodular densities and calcified pleural plaques  are unchanged. There is no focal lung infiltrate. Heart size and mediastinal contours are within normal limits. IMPRESSION: 1. No acute right rib fractures. 2. No acute cardiopulmonary process. 3. Calcified granulomas throughout both lungs. Electronically Signed   By: Darliss Cheney M.D.   On: 03/09/2021 15:06   DG Elbow Complete Right  Result Date: 03/09/2021 CLINICAL DATA:  Fall. EXAM: RIGHT ELBOW - COMPLETE 3+ VIEW COMPARISON:  None. FINDINGS: There is no evidence of fracture, dislocation, or joint effusion. There is no evidence of arthropathy or other focal bone abnormality. Osteopenia. Soft tissues are unremarkable. IMPRESSION: 1.  No acute osseous abnormality. Electronically Signed   By: Obie Dredge M.D.   On: 03/09/2021 15:04        Scheduled Meds:  chlordiazePOXIDE  25 mg Oral TID   Followed by   Melene Muller ON 03/12/2021] chlordiazePOXIDE  25 mg Oral BH-qamhs   Followed by   Melene Muller ON 03/13/2021] chlordiazePOXIDE  25 mg Oral Daily   cholecalciferol  1,000 Units Oral Daily   feeding supplement  237 mL Oral BID BM   folic acid  1 mg Oral Daily   metoprolol tartrate  25 mg Oral BID   multivitamin with minerals  1 tablet Oral Daily   thiamine  100 mg Oral Daily   Or   thiamine  100 mg Intravenous Daily   cyanocobalamin  1,000 mcg Oral Daily     LOS: 2 days    Time spent: 35 minutes    Theodoro Koval Hoover Brunette, DO [facility]  If 7PM-7AM, please contact night-coverage [email or web address] 03/11/2021, 11:20 AM

## 2021-03-11 NOTE — TOC Progression Note (Signed)
Transition of Care [facility]) - Progression Note    Patient Details  Name: Larry [name] MRN: [id number] Date of Birth: April 30, 1950  [facility]) CM/SW Contact  Elliot Gault, LCSW Phone Number: 03/11/2021, 11:27 AM  Clinical Narrative:     TOC following. [facility] will accept pt again for rehab. Pt's [employer] Medicare is not managed by [employer] and so Debbie at [facility] will have to obtain SNF authorization.   Per MD, pt is medically stable for dc. Pt can transfer to [facility] once authorization is complete.  TOC will follow and assist with dc planning.  Expected Discharge Plan: Skilled Nursing Facility Barriers to Discharge: Continued Medical Work up  Expected Discharge Plan and Services Expected Discharge Plan: Skilled Nursing Facility In-house Referral: Clinical Social Work Discharge Planning Services: CM Consult Post Acute Care Choice: Skilled Nursing Facility Living arrangements for the past 2 months: Mobile Home                                       Social Determinants of Health (SDOH) Interventions    Readmission Risk Interventions Readmission Risk Prevention Plan 03/10/2021 02/11/2021  Medication Screening Complete -  Transportation Screening Complete Complete  Home Care Screening - Complete  Medication Review (RN CM) - Complete  Some recent data might be hidden

## 2021-03-11 NOTE — Progress Notes (Signed)
Has been calm and sleeping mostly alll night. Given scheduled librium but has needed no ativan.

## 2021-03-12 DIAGNOSIS — F10939 Alcohol use, unspecified with withdrawal, unspecified: Secondary | ICD-10-CM

## 2021-03-12 DIAGNOSIS — R531 Weakness: Secondary | ICD-10-CM | POA: Diagnosis not present

## 2021-03-12 DIAGNOSIS — E8889 Other specified metabolic disorders: Secondary | ICD-10-CM

## 2021-03-12 DIAGNOSIS — E860 Dehydration: Secondary | ICD-10-CM | POA: Diagnosis not present

## 2021-03-12 LAB — CBC
HCT: 32.1 % — ABNORMAL LOW (ref 39.0–52.0)
Hemoglobin: 11 g/dL — ABNORMAL LOW (ref 13.0–17.0)
MCH: 34.9 pg — ABNORMAL HIGH (ref 26.0–34.0)
MCHC: 34.3 g/dL (ref 30.0–36.0)
MCV: 101.9 fL — ABNORMAL HIGH (ref 80.0–100.0)
Platelets: 66 10*3/uL — ABNORMAL LOW (ref 150–400)
RBC: 3.15 MIL/uL — ABNORMAL LOW (ref 4.22–5.81)
RDW: 12.9 % (ref 11.5–15.5)
WBC: 4.7 10*3/uL (ref 4.0–10.5)
nRBC: 0 % (ref 0.0–0.2)

## 2021-03-12 LAB — BASIC METABOLIC PANEL
Anion gap: 5 (ref 5–15)
BUN: 16 mg/dL (ref 8–23)
CO2: 28 mmol/L (ref 22–32)
Calcium: 8.5 mg/dL — ABNORMAL LOW (ref 8.9–10.3)
Chloride: 104 mmol/L (ref 98–111)
Creatinine, Ser: 0.61 mg/dL (ref 0.61–1.24)
GFR, Estimated: >60 mL/min (ref >60)
Glucose, Bld: 110 mg/dL — ABNORMAL HIGH (ref 70–99)
Potassium: 3.6 mmol/L (ref 3.5–5.1)
Sodium: 137 mmol/L (ref 135–145)

## 2021-03-12 LAB — MAGNESIUM: Magnesium: 1.6 mg/dL — ABNORMAL LOW (ref 1.7–2.4)

## 2021-03-12 LAB — URINE CULTURE

## 2021-03-12 NOTE — Progress Notes (Signed)
PROGRESS NOTE    NATION CRADLE  TKP:[id number] DOB: 03-09-1951 DOA: 03/09/2021 PCP: Benita Stabile, MD   Brief Narrative:   Patient 70 year old gentleman history of chronic alcohol use, hypertension presented to the ED with generalized weakness recurrent falls due to ambulatory difficulty.  Patient with difficulty getting out of bed over the past 4 days due to weakness with decreased oral intake.  Patient noted to have had a fall 4 days prior to admission landing on his right side complain of right-sided chest wall pain and was concerned for rib fractures.  Rib film chest x-ray done negative for any rib fractures or infiltrate.  Patient noted on admission to have some intermittent tachycardia, elevated blood pressure.  Patient admitted placed on the Ativan withdrawal protocol and is now stable on Librium taper.  PT consulted-with recommendations for SNF/rehab on discharge.  Currently awaiting placement.  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  AST of 57, ALT of 31. -Alcohol cessation counseling has been encouraged. -Continue Librium detox protocol -Continue CIWA with as needed lorazepam; patient also receiving thiamine, folic acid and multivitamin. -TOC consulted with placement pending; still awaiting insurance authorization.  2.  High anion gap metabolic acidosis secondary to alcohol/starvation ketoacidosis -Patient noted to have anion gap of 23, bicarb of 18 on  admission and hydrated with IV fluids. -Anion gap improving acidosis improved after fluid resuscitation and bicarbonate. -Current bicarb 28  3.  Dehydration -Fluid resuscitation provided and well-tolerated -Tolerating oral intake  4.  Frequent falls due to ambulatory dysfunction secondary to generalized weakness/failure to thrive/physical deconditioning -Fall precautions. -PT/OT has seen patient and recommended skilled nursing facility for rehabilitation and conditioning. -Body mass index is 17.33 kg/m. -Feeding supplements are recommended.  5.  Elevated troponin secondary to type II demand ischemia -Troponins seem to be flattened. -Patient asymptomatic denies any chest pain. -Continue supportive care.  6.  Hypertension-stable Continue lisinopril, Lopressor. -HCTZ held secondary to dehydration. -Heart healthy diet has been encouraged.  7.  Elevated MCV -Vitamin B12 696, folate at 7.5. -Hemoglobin stable at 11.5. -Continue to follow hemoglobin trend.  8.  Pneumoconiosis -Chest x-ray with calcified granulomas throughout both lungs. -Patient endorsed to admitting physician having coworkers lung disease. -Asymptomatic, stable. -Outpatient follow-up with pulmonology recommended.  9.  Thrombocytopenia -Likely reactive versus secondary to chronic alcohol use. -No overt bleeding appreciated. -Avoid the use of heparin products. -SCDs for DVT prophylaxis; follow platelet counts.  10.  Eye discharge/? allergic conjunctivitis -Improvement in his right eye discharge appreciated. -Continue the use of eyedrops. -Patient educated about importance of not touching his eyes.     DVT prophylaxis: scd Code Status: Full Family Communication: Updated patient.  No family at bedside Disposition:    Status is: Inpatient   Remains inpatient appropriate because: Severity of illness/as safe discharge     Consultants:  None   Procedures:  Chest x-ray with rib films 03/09/2021 Plain  films of the right elbow 03/09/2021   Antimicrobials:  None  Subjective: In no acute distress; denies chest pain, no nausea, no vomiting.  No significant withdrawal symptoms appreciated.  Patient is weak and frail.  Objective: Vitals:   03/11/21 1907 03/12/21 0011 03/12/21 0830 03/12/21 1430  BP: 120/72 129/62 127/65 126/66  Pulse: 83 81 80 79  Resp: 17 20 19 19   Temp: 99.1 F (37.3 C) 98.5 F (36.9 C) 98.7 F (37.1 C) 98.6 F (37 C)  TempSrc: Oral Oral Oral Oral  SpO2: 98% 97% 96% 97%  Weight:      Height:        Intake/Output Summary (Last 24 hours) at 03/12/2021 1855 Last data filed at 03/12/2021 1747 Gross per 24 hour  Intake 360 ml  Output 900 ml  Net -540 ml   Filed Weights   03/09/21 1258  Weight: 61.2 kg    Examination: General exam: Alert, awake, following commands; no chest pain, no nausea, no vomiting. Respiratory system: Clear to auscultation. Respiratory effort normal.  No using accessory muscles. Cardiovascular system:RRR. No murmurs, rubs, gallops.  No JVD. Gastrointestinal system: Abdomen is nondistended, soft and nontender. No organomegaly or masses felt. Normal bowel sounds heard. Central nervous system: Alert and oriented. No focal neurological deficits. Extremities: No cyanosis or clubbing. Skin: No petechiae. Psychiatry: Stable mood.  Data Reviewed: I have personally reviewed following labs and imaging studies  CBC: Recent Labs  Lab 03/09/21 1400 03/10/21 0538 03/11/21 0518 03/12/21 0538  WBC 5.6 5.2 4.3 4.7  NEUTROABS 3.6  --  2.3  --   HGB 14.2 11.5* 11.5* 11.0*  HCT 41.1 34.1* 33.9* 32.1*  MCV 100.5* 101.5* 99.7 101.9*  PLT 110* 85* 60* 66*   Basic Metabolic Panel: Recent Labs  Lab 03/09/21 1400 03/10/21 0538 03/11/21 0518 03/12/21 0538  NA 137 134* 138 137  K 4.2 4.1 3.3* 3.6  CL 96* 95* 102 104  CO2 18* 26 27 28   GLUCOSE 75 90 100* 110*  BUN 17 16 14 16   CREATININE 0.66 0.68 0.57* 0.61  CALCIUM 9.1 9.1 8.8* 8.5*   MG  --  1.7 1.6* 1.6*  PHOS  --  2.6  --   --    GFR: Estimated Creatinine Clearance: 74.4 mL/min (by C-G formula based on SCr of 0.61 mg/dL). Liver Function Tests: Recent Labs  Lab 03/09/21 1400 03/10/21 0538 03/11/21 0518  AST 57* 42* 32  ALT 31 26 20   ALKPHOS 88 80 85  BILITOT 1.4* 2.0* 1.0  PROT 7.1 6.3* 5.6*  ALBUMIN 3.8 3.4* 3.0*    Coagulation Profile: Recent Labs  Lab 03/10/21 0538  INR 1.0   CBG: Recent Labs  Lab 03/09/21 1426  GLUCAP 72   Anemia Panel: Recent Labs    03/10/21 0538  VITAMINB12 696  FOLATE 7.5   Sepsis Labs: No results for input(s): PROCALCITON, LATICACIDVEN in the last 168 hours.  Recent Results (from the past 240 hour(s))  Resp Panel by RT-PCR (Flu A&B, Covid) Nasopharyngeal Swab     Status: None   Collection Time: 03/09/21  8:40 PM   Specimen: Nasopharyngeal Swab; Nasopharyngeal(NP) swabs in vial transport medium  Result Value Ref Range Status   SARS Coronavirus 2 by RT PCR NEGATIVE NEGATIVE Final    Comment: (NOTE) SARS-CoV-2 target nucleic acids are NOT DETECTED.  The SARS-CoV-2 RNA is generally detectable in upper respiratory specimens during the acute phase of infection. The lowest concentration of SARS-CoV-2 viral copies this assay can detect is 138 copies/mL. A negative result does not preclude  SARS-Cov-2 infection and should not be used as the sole basis for treatment or other patient management decisions. A negative result may occur with  improper specimen collection/handling, submission of specimen other than nasopharyngeal swab, presence of viral mutation(s) within the areas targeted by this assay, and inadequate number of viral copies(<138 copies/mL). A negative result must be combined with clinical observations, patient history, and epidemiological information. The expected result is Negative.  Fact Sheet for Patients:  BloggerCourse.com  Fact Sheet for Healthcare Providers:   SeriousBroker.it  This test is no t yet approved or cleared by the [place] FDA and  has been authorized for detection and/or diagnosis of SARS-CoV-2 by FDA under an Emergency Use Authorization (EUA). This EUA will remain  in effect (meaning this test can be used) for the duration of the COVID-19 declaration under Section 564(b)(1) of the Act, 21 U.S.C.section 360bbb-3(b)(1), unless the authorization is terminated  or revoked sooner.       Influenza A by PCR NEGATIVE NEGATIVE Final   Influenza B by PCR NEGATIVE NEGATIVE Final    Comment: (NOTE) The Xpert Xpress SARS-CoV-2/FLU/RSV plus assay is intended as an aid in the diagnosis of influenza from Nasopharyngeal swab specimens and should not be used as a sole basis for treatment. Nasal washings and aspirates are unacceptable for Xpert Xpress SARS-CoV-2/FLU/RSV testing.  Fact Sheet for Patients: BloggerCourse.com  Fact Sheet for Healthcare Providers: SeriousBroker.it  This test is not yet approved or cleared by the [place] FDA and has been authorized for detection and/or diagnosis of SARS-CoV-2 by FDA under an Emergency Use Authorization (EUA). This EUA will remain in effect (meaning this test can be used) for the duration of the COVID-19 declaration under Section 564(b)(1) of the Act, 21 U.S.C. section 360bbb-3(b)(1), unless the authorization is terminated or revoked.  Performed at [facility], [address], [place], [place] [address]   Urine Culture     Status: Abnormal   Collection Time: 03/10/21  4:18 PM   Specimen: Urine, Clean Catch  Result Value Ref Range Status   Specimen Description   Final    URINE, CLEAN CATCH Performed at [facility] [place], [address], [place], [place] [address]    Special Requests   Final    NONE Performed at [facility], [address] [place], [place] [address]    Culture MULTIPLE SPECIES PRESENT,  SUGGEST RECOLLECTION (A)  Final   Report Status 03/12/2021 FINAL  Final     Radiology Studies: No results found.   Scheduled Meds:  chlordiazePOXIDE  25 mg Oral BH-qamhs   Followed by   Melene Muller ON 03/13/2021] chlordiazePOXIDE  25 mg Oral Daily   cholecalciferol  1,000 Units Oral Daily   feeding supplement  237 mL Oral BID BM   folic acid  1 mg Oral Daily   metoprolol tartrate  25 mg Oral BID   multivitamin with minerals  1 tablet Oral Daily   thiamine  100 mg Oral Daily   Or   thiamine  100 mg Intravenous Daily   cyanocobalamin  1,000 mcg Oral Daily     LOS: 3 days    Time spent: 30 minutes    Vassie Loll, MD [facility]  If 7PM-7AM, please contact night-coverage [email or web address] 03/12/2021, 6:55 PM

## 2021-03-13 DIAGNOSIS — F10930 Alcohol use, unspecified with withdrawal, uncomplicated: Secondary | ICD-10-CM | POA: Diagnosis not present

## 2021-03-13 NOTE — TOC Progression Note (Signed)
Transition of Care [facility]) - Progression Note    Patient Details  Name: Larry [name] MRN: [id number] Date of Birth: 17-Mar-1951  [facility]) CM/SW Contact  Barry Brunner, LCSW Phone Number: 03/13/2021, 11:38 AM  Clinical Narrative:    CSW contacted Debbie with [employer] to inquire about auth. Debbie reported no auth received yet. TOC to follow.   Expected Discharge Plan: Skilled Nursing Facility Barriers to Discharge: Other (must enter comment) (Awaiting insurance auth for SNF)  Expected Discharge Plan and Services Expected Discharge Plan: Skilled Nursing Facility In-house Referral: Clinical Social Work Discharge Planning Services: CM Consult Post Acute Care Choice: Skilled Nursing Facility Living arrangements for the past 2 months: Mobile Home                                       Social Determinants of Health (SDOH) Interventions    Readmission Risk Interventions Readmission Risk Prevention Plan 03/10/2021 02/11/2021  Medication Screening Complete -  Transportation Screening Complete Complete  Home Care Screening - Complete  Medication Review (RN CM) - Complete  Some recent data might be hidden

## 2021-03-13 NOTE — Progress Notes (Signed)
PROGRESS NOTE    KENTO GOSSMAN  CLE:[id number] DOB: 1950/12/04 DOA: 03/09/2021 PCP: Benita Stabile, MD   Brief Narrative:   Patient 70 year old gentleman history of chronic alcohol use, hypertension presented to the ED with generalized weakness recurrent falls due to ambulatory difficulty.  Patient with difficulty getting out of bed over the past 4 days due to weakness with decreased oral intake.  Patient noted to have had a fall 4 days prior to admission landing on his right side complain of right-sided chest wall pain and was concerned for rib fractures.  Rib film chest x-ray done negative for any rib fractures or infiltrate.  Patient noted on admission to have some intermittent tachycardia, elevated blood pressure.  Patient admitted placed on the Ativan withdrawal protocol and is now stable on Librium taper.  PT consulted-with recommendations for SNF/rehab on discharge.  Currently awaiting placement.  Assessment & Plan:   Principal Problem:   Alcohol withdrawal (HCC) Active Problems:   Falls frequently   Essential hypertension   Alcohol intoxication (HCC)   Generalized weakness   Elevated troponin   Failure to thrive (child)   Physical deconditioning   Elevated AST (SGOT)   Thrombocytopenia (HCC)   Elevated MCV   High anion gap metabolic acidosis   Dehydration   Pneumoconiosis, coal, workers' [facility])   Eye discharge   Protein-calorie malnutrition, severe   #1 alcohol intoxication/withdrawal -Patient noted to have alcohol level of 47 on admission.  AST of 57, ALT of 31. -Alcohol cessation counseling has been encouraged. -Continue Librium detox protocol -Continue CIWA with as needed lorazepam; patient also receiving thiamine, folic acid and multivitamin. -TOC consulted with placement pending; still awaiting insurance authorization.  2.  High anion gap metabolic acidosis secondary to alcohol/starvation ketoacidosis -Patient noted to have anion gap of 23, bicarb of 18 on  admission and hydrated with IV fluids. -Anion gap improving acidosis improved after fluid resuscitation and bicarbonate. -Current bicarb 28  3.  Dehydration -Fluid resuscitation provided and well-tolerated -Tolerating oral intake  4.  Frequent falls due to ambulatory dysfunction secondary to generalized weakness/failure to thrive/physical deconditioning -Fall precautions. -PT/OT has seen patient and recommended skilled nursing facility for rehabilitation and conditioning. -Body mass index is 17.33 kg/m. -Feeding supplements are recommended.  5.  Elevated troponin secondary to type II demand ischemia -Troponins seem to be flattened. -Patient asymptomatic denies any chest pain. -Continue supportive care.  6.  Hypertension-stable Continue lisinopril, Lopressor. -HCTZ held secondary to dehydration. -Heart healthy diet has been encouraged.  7.  Elevated MCV -Vitamin B12 696, folate at 7.5. -Hemoglobin stable at 11.5. -Continue to follow hemoglobin trend.  8.  Pneumoconiosis -Chest x-ray with calcified granulomas throughout both lungs. -Patient endorsed to admitting physician having coworkers lung disease. -Asymptomatic, stable. -Outpatient follow-up with pulmonology recommended.  9.  Thrombocytopenia-stable -Likely reactive versus secondary to chronic alcohol use. -No overt bleeding appreciated. -Avoid the use of heparin products. -SCDs for DVT prophylaxis; follow platelet counts.  10.  Eye discharge/? allergic conjunctivitis -Improvement in his right eye discharge appreciated. -Continue the use of eyedrops. -Patient educated about importance of not touching his eyes.     DVT prophylaxis: scd Code Status: Full Family Communication: Updated patient.  No family at bedside Disposition:    Status is: Inpatient   Remains inpatient appropriate because: Severity of illness/as safe discharge     Consultants:  None   Procedures:  Chest x-ray with rib films  03/09/2021 Plain films of the right elbow 03/09/2021   Antimicrobials:  None   Subjective: Patient seen and evaluated today and is currently somnolent.  No acute overnight events noted.  Objective: Vitals:   03/12/21 0830 03/12/21 1430 03/12/21 2033 03/13/21 0555  BP: 127/65 126/66 127/65 139/77  Pulse: 80 79 81 82  Resp: Temp: 98.7 F (37.1 C) 98.6 F (37 C) 98 F (36.7 C) 97.9 F (36.6 C)  TempSrc: Oral Oral Oral   SpO2: 96% 97% 97% 97%  Weight:      Height:        Intake/Output Summary (Last 24 hours) at 03/13/2021 1203 Last data filed at 03/12/2021 1747 Gross per 24 hour  Intake 360 ml  Output 900 ml  Net -540 ml   Filed Weights   03/09/21 1258  Weight: 61.2 kg    Examination:  General exam: Appears calm and comfortable, somnolent but arousable Respiratory system: Clear to auscultation. Respiratory effort normal. Cardiovascular system: S1 & S2 heard, RRR.  Gastrointestinal system: Abdomen is soft Central nervous system: Somnolent Extremities: No edema Skin: No significant lesions noted Psychiatry: Flat affect.    Data Reviewed: I have personally reviewed following labs and imaging studies  CBC: Recent Labs  Lab 03/09/21 1400 03/10/21 0538 03/11/21 0518 03/12/21 0538  WBC 5.6 5.2 4.3 4.7  NEUTROABS 3.6  --  2.3  --   HGB 14.2 11.5* 11.5* 11.0*  HCT 41.1 34.1* 33.9* 32.1*  MCV 100.5* 101.5* 99.7 101.9*  PLT 110* 85* 60* 66*   Basic Metabolic Panel: Recent Labs  Lab 03/09/21 1400 03/10/21 0538 03/11/21 0518 03/12/21 0538  NA 137 134* 138 137  K 4.2 4.1 3.3* 3.6  CL 96* 95* 102 104  CO2 18* GLUCOSE 75 90 100* 110*  BUN CREATININE 0.66 0.68 0.57* 0.61  CALCIUM 9.1 9.1 8.8* 8.5*  MG  --  1.7 1.6* 1.6*  PHOS  --  2.6  --   --    GFR: Estimated Creatinine Clearance: 74.4 mL/min (by C-G formula based on SCr of 0.61 mg/dL). Liver Function Tests: Recent Labs  Lab 03/09/21 1400 03/10/21 0538  03/11/21 0518  AST 57* 42* 32  ALT ALKPHOS 88 80 85  BILITOT 1.4* 2.0* 1.0  PROT 7.1 6.3* 5.6*  ALBUMIN 3.8 3.4* 3.0*   No results for input(s): LIPASE, AMYLASE in the last 168 hours. No results for input(s): AMMONIA in the last 168 hours. Coagulation Profile: Recent Labs  Lab 03/10/21 0538  INR 1.0   Cardiac Enzymes: No results for input(s): CKTOTAL, CKMB, CKMBINDEX, TROPONINI in the last 168 hours. BNP (last 3 results) No results for input(s): PROBNP in the last 8760 hours. HbA1C: No results for input(s): HGBA1C in the last 72 hours. CBG: Recent Labs  Lab 03/09/21 1426  GLUCAP 72   Lipid Profile: No results for input(s): CHOL, HDL, LDLCALC, TRIG, CHOLHDL, LDLDIRECT in the last 72 hours. Thyroid Function Tests: No results for input(s): TSH, T4TOTAL, FREET4, T3FREE, THYROIDAB in the last 72 hours. Anemia Panel: No results for input(s): VITAMINB12, FOLATE, FERRITIN, TIBC, IRON, RETICCTPCT in the last 72 hours. Sepsis Labs: No results for input(s): PROCALCITON, LATICACIDVEN in the last 168 hours.  Recent Results (from the past 240 hour(s))  Resp Panel by RT-PCR (Flu A&B, Covid) Nasopharyngeal Swab     Status: None   Collection Time: 03/09/21  8:40 PM   Specimen: Nasopharyngeal Swab; Nasopharyngeal(NP) swabs in vial transport medium  Result Value Ref  Range Status   SARS Coronavirus 2 by RT PCR NEGATIVE NEGATIVE Final    Comment: (NOTE) SARS-CoV-2 target nucleic acids are NOT DETECTED.  The SARS-CoV-2 RNA is generally detectable in upper respiratory specimens during the acute phase of infection. The lowest concentration of SARS-CoV-2 viral copies this assay can detect is 138 copies/mL. A negative result does not preclude SARS-Cov-2 infection and should not be used as the sole basis for treatment or other patient management decisions. A negative result may occur with  improper specimen collection/handling, submission of specimen other than nasopharyngeal  swab, presence of viral mutation(s) within the areas targeted by this assay, and inadequate number of viral copies(<138 copies/mL). A negative result must be combined with clinical observations, patient history, and epidemiological information. The expected result is Negative.  Fact Sheet for Patients:  BloggerCourse.com  Fact Sheet for Healthcare Providers:  SeriousBroker.it  This test is no t yet approved or cleared by the [place] FDA and  has been authorized for detection and/or diagnosis of SARS-CoV-2 by FDA under an Emergency Use Authorization (EUA). This EUA will remain  in effect (meaning this test can be used) for the duration of the COVID-19 declaration under Section 564(b)(1) of the Act, 21 U.S.C.section 360bbb-3(b)(1), unless the authorization is terminated  or revoked sooner.       Influenza A by PCR NEGATIVE NEGATIVE Final   Influenza B by PCR NEGATIVE NEGATIVE Final    Comment: (NOTE) The Xpert Xpress SARS-CoV-2/FLU/RSV plus assay is intended as an aid in the diagnosis of influenza from Nasopharyngeal swab specimens and should not be used as a sole basis for treatment. Nasal washings and aspirates are unacceptable for Xpert Xpress SARS-CoV-2/FLU/RSV testing.  Fact Sheet for Patients: BloggerCourse.com  Fact Sheet for Healthcare Providers: SeriousBroker.it  This test is not yet approved or cleared by the [place] FDA and has been authorized for detection and/or diagnosis of SARS-CoV-2 by FDA under an Emergency Use Authorization (EUA). This EUA will remain in effect (meaning this test can be used) for the duration of the COVID-19 declaration under Section 564(b)(1) of the Act, 21 U.S.C. section 360bbb-3(b)(1), unless the authorization is terminated or revoked.  Performed at [facility], [address], [place], [place] [address]   Urine Culture      Status: Abnormal   Collection Time: 03/10/21  4:18 PM   Specimen: Urine, Clean Catch  Result Value Ref Range Status   Specimen Description   Final    URINE, CLEAN CATCH Performed at [facility], [address] [place], [place] [address]    Special Requests   Final    NONE Performed at [facility], [address], [place], [place] [address]    Culture MULTIPLE SPECIES PRESENT, SUGGEST RECOLLECTION (A)  Final   Report Status 03/12/2021 FINAL  Final         Radiology Studies: No results found.      Scheduled Meds:  cholecalciferol  1,000 Units Oral Daily   feeding supplement  237 mL Oral BID BM   folic acid  1 mg Oral Daily   metoprolol tartrate  25 mg Oral BID   multivitamin with minerals  1 tablet Oral Daily   thiamine  100 mg Oral Daily   Or   thiamine  100 mg Intravenous Daily   cyanocobalamin  1,000 mcg Oral Daily     LOS: 4 days    Time spent: 35 minutes    Kyvon Hu Hoover Brunette, DO [facility]  If 7PM-7AM,  please contact night-coverage [email or web address] 03/13/2021, 12:03 PM

## 2021-03-13 NOTE — TOC Progression Note (Signed)
Transition of Care [facility]) - Progression Note    Patient Details  Name: Larry [name] MRN: [id number] Date of Birth: 22-Mar-1951  [facility]) CM/SW Contact  Barry Brunner, LCSW Phone Number: 03/13/2021, 6:12 PM  Clinical Narrative:    [name] reported auth not yet received. TOC to follow.   Expected Discharge Plan: Skilled Nursing Facility Barriers to Discharge: Other (must enter comment) (Awaiting insurance auth for SNF)  Expected Discharge Plan and Services Expected Discharge Plan: Skilled Nursing Facility In-house Referral: Clinical Social Work Discharge Planning Services: CM Consult Post Acute Care Choice: Skilled Nursing Facility Living arrangements for the past 2 months: Mobile Home                                       Social Determinants of Health (SDOH) Interventions    Readmission Risk Interventions Readmission Risk Prevention Plan 03/10/2021 02/11/2021  Medication Screening Complete -  Transportation Screening Complete Complete  Home Care Screening - Complete  Medication Review (RN CM) - Complete  Some recent data might be hidden

## 2021-03-13 NOTE — Plan of Care (Signed)
  Problem: Education: Goal: Knowledge of General Education information will improve Description Including pain rating scale, medication(s)/side effects and non-pharmacologic comfort measures Outcome: Progressing   Problem: Health Behavior/Discharge Planning: Goal: Ability to manage health-related needs will improve Outcome: Progressing   

## 2021-03-14 DIAGNOSIS — J449 Chronic obstructive pulmonary disease, unspecified: Secondary | ICD-10-CM | POA: Diagnosis not present

## 2021-03-14 DIAGNOSIS — I10 Essential (primary) hypertension: Secondary | ICD-10-CM | POA: Diagnosis not present

## 2021-03-14 DIAGNOSIS — R531 Weakness: Secondary | ICD-10-CM | POA: Diagnosis not present

## 2021-03-14 DIAGNOSIS — E039 Hypothyroidism, unspecified: Secondary | ICD-10-CM | POA: Diagnosis not present

## 2021-03-14 DIAGNOSIS — R627 Adult failure to thrive: Secondary | ICD-10-CM

## 2021-03-14 LAB — GLUCOSE, CAPILLARY
Glucose-Capillary: 126 mg/dL — ABNORMAL HIGH (ref 70–99)
Glucose-Capillary: 141 mg/dL — ABNORMAL HIGH (ref 70–99)
Glucose-Capillary: 119 mg/dL — ABNORMAL HIGH (ref 70–99)

## 2021-03-14 MED ORDER — LORAZEPAM 1 MG PO TABS: 1.0000 mg | ORAL_TABLET | ORAL | Status: DC | PRN

## 2021-03-14 MED ORDER — LORAZEPAM 2 MG/ML IJ SOLN: 1.0000 mg | INTRAMUSCULAR | Status: DC | PRN

## 2021-03-14 MED ORDER — CHLORDIAZEPOXIDE HCL 5 MG PO CAPS
5.0000 mg | ORAL_CAPSULE | Freq: Two times a day (BID) | ORAL | Status: DC
  Administered 2021-03-14 – 2021-03-15 (×2): 5 mg via ORAL
  Filled 2021-03-14 (×2): qty 1

## 2021-03-14 NOTE — Progress Notes (Signed)
PROGRESS NOTE    Larry [name]  QQI:[id number] DOB: Jan 19, 1951 DOA: 03/09/2021 PCP: Benita Stabile, MD   Brief Narrative:   Patient 70 year old gentleman history of chronic alcohol use, hypertension presented to the ED with generalized weakness recurrent falls due to ambulatory difficulty.  Patient with difficulty getting out of bed over the past 4 days due to weakness with decreased oral intake.  Patient noted to have had a fall 4 days prior to admission landing on his right side complain of right-sided chest wall pain and was concerned for rib fractures.  Rib film chest x-ray done negative for any rib fractures or infiltrate.  Patient noted on admission to have some intermittent tachycardia, elevated blood pressure.  Patient admitted placed on the Ativan withdrawal protocol and is now stable on Librium taper.  PT consulted-with recommendations for SNF/rehab on discharge.  Currently awaiting placement.  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  AST of 57, ALT of 31. ---Delirium tremens symptoms are improving overall -Continue Librium detox protocol -Continue CIWA with as needed lorazepam; patient also receiving thiamine, folic acid and multivitamin. -TOC consulted with placement pending; still awaiting insurance authorization.  2.  High anion gap metabolic acidosis secondary to alcohol/starvation ketoacidosis -Patient noted to have anion gap of 23, bicarb of 18 on  admission and hydrated with IV fluids. -Improved with hydration  3.  Dehydration -Resolved with hydration  4.  Frequent falls due to ambulatory dysfunction secondary to generalized weakness/failure to thrive/physical deconditioning -Fall precautions. -PT/OT has seen patient and recommended skilled nursing facility for rehabilitation and conditioning. -Body mass index is 17.33 kg/m. -Feeding supplements are recommended.  5.  Elevated troponin secondary to type II demand ischemia -Troponins seem to be flattened. -Patient asymptomatic denies any chest pain. -Continue supportive care.  6.  Hypertension-stable Continue lisinopril, Lopressor. -HCTZ held secondary to dehydration. -Heart healthy diet has been encouraged.  7.  Elevated MCV -Due to EtOH abuse -Vitamin B12 696, folate at 7.5. -Hemoglobin stable at 11.5. -Continue to follow hemoglobin trend.  8.  Pneumoconiosis -Chest x-ray with calcified granulomas throughout both lungs. -Patient endorsed to admitting physician having coworkers lung disease. -Asymptomatic, stable. -Outpatient follow-up with pulmonology recommended.  9.  Thrombocytopenia-stable -Suspect secondary to chronic alcohol use. -No overt bleeding appreciated. -Avoid the use of heparin products. -SCDs for DVT prophylaxis; follow platelet counts.  10.  Eye discharge/? allergic conjunctivitis -Improvement in his right eye discharge appreciated. -Continue the use of eyedrops. -Patient educated about importance of not touching his eyes.     DVT prophylaxis: scd Code Status: Full Family Communication: Updated patient.  Patient's son visited  disposition: To SNF when insurance authorization is received   Status is: Inpatient   Remains inpatient appropriate because: Severity of illness/as safe discharge     Consultants:  None   Procedures:  Chest x-ray with rib films 03/09/2021 Plain films of the right elbow 03/09/2021   Antimicrobials:   None   Subjective: More awake,  talkative, interactive, cooperative -Tremors appears to be improving  Objective: Vitals:   03/13/21 1425 03/13/21 2107 03/14/21 0505 03/14/21 1101  BP: 114/65 (!) 113/57 126/73 97/69  Pulse: 76 81 73 78  Resp: 20 18 16    Temp: 98.6 F (37 C) 98.6 F (37 C) 99 F (37.2 C)   TempSrc: Oral     SpO2: 95% 99% 96%   Weight:      Height:        Intake/Output Summary (Last 24 hours) at 03/14/2021 1615 Last data filed at 03/14/2021 1300 Gross per 24 hour  Intake 960 ml  Output --  Net 960 ml   Filed Weights   03/09/21 1258  Weight: 61.2 kg    Examination:  Physical Exam Gen:- Awake Alert, in no acute distress  HEENT:- [place].AT, No sclera icterus Neck-Supple Neck,No JVD,.  Lungs-  CTAB , fair air movement bilaterally  CV- S1, S2 normal, RRR Abd-  +ve B.Sounds, Abd Soft, No tenderness,    Extremity/Skin:- No  edema,   good pedal pulses  Psych-affect is appropriate, oriented x3, cooperative Neuro-generalized weakness, no new focal deficits, improved tremors  Data Reviewed: I have personally reviewed following labs and imaging studies  CBC: Recent Labs  Lab 03/09/21 1400 03/10/21 0538 03/11/21 0518 03/12/21 0538  WBC 5.6 5.2 4.3 4.7  NEUTROABS 3.6  --  2.3  --   HGB 14.2 11.5* 11.5* 11.0*  HCT 41.1 34.1* 33.9* 32.1*  MCV 100.5* 101.5* 99.7 101.9*  PLT 110* 85* 60* 66*   Basic Metabolic Panel: Recent Labs  Lab 03/09/21 1400 03/10/21 0538 03/11/21 0518 03/12/21 0538  NA 137 134* 138 137  K 4.2 4.1 3.3* 3.6  CL 96* 95* 102 104  CO2 18* 26 27 28   GLUCOSE 75 90 100* 110*  BUN 17 16 14 16   CREATININE 0.66 0.68 0.57* 0.61  CALCIUM 9.1 9.1 8.8* 8.5*  MG  --  1.7 1.6* 1.6*  PHOS  --  2.6  --   --    GFR: Estimated Creatinine Clearance: 74.4 mL/min (by C-G formula based on SCr of 0.61 mg/dL). Liver Function Tests: Recent Labs  Lab 03/09/21 1400 03/10/21 0538 03/11/21 0518  AST 57* 42* 32  ALT 31 26 20   ALKPHOS 88 80 85   BILITOT 1.4* 2.0* 1.0  PROT 7.1 6.3* 5.6*  ALBUMIN 3.8 3.4* 3.0*   No results for input(s): LIPASE, AMYLASE in the last 168 hours. No results for input(s): AMMONIA in the last 168 hours. Coagulation Profile: Recent Labs  Lab 03/10/21 0538  INR 1.0   Cardiac Enzymes: No results for input(s): CKTOTAL, CKMB, CKMBINDEX, TROPONINI in the last 168 hours. BNP (last 3 results) No results for input(s): PROBNP in the last 8760 hours. HbA1C: No results for input(s): HGBA1C in the last 72 hours. CBG: Recent Labs  Lab 03/09/21 1426 03/14/21 0734 03/14/21 1131  GLUCAP 72 126* 141*   Lipid Profile: No results for input(s): CHOL, HDL, LDLCALC, TRIG, CHOLHDL, LDLDIRECT in the last 72 hours. Thyroid Function Tests: No results for input(s): TSH, T4TOTAL, FREET4, T3FREE, THYROIDAB in the last 72 hours. Anemia Panel: No results for input(s): VITAMINB12, FOLATE, FERRITIN, TIBC, IRON, RETICCTPCT in the last 72 hours. Sepsis Labs: No results for input(s): PROCALCITON, LATICACIDVEN in the last 168 hours.  Recent Results (from the past 240 hour(s))  Resp Panel by RT-PCR (Flu A&B, Covid) Nasopharyngeal Swab     Status: None   Collection Time: 03/09/21  8:40 PM  Specimen: Nasopharyngeal Swab; Nasopharyngeal(NP) swabs in vial transport medium  Result Value Ref Range Status   SARS Coronavirus 2 by RT PCR NEGATIVE NEGATIVE Final    Comment: (NOTE) SARS-CoV-2 target nucleic acids are NOT DETECTED.  The SARS-CoV-2 RNA is generally detectable in upper respiratory specimens during the acute phase of infection. The lowest concentration of SARS-CoV-2 viral copies this assay can detect is 138 copies/mL. A negative result does not preclude SARS-Cov-2 infection and should not be used as the sole basis for treatment or other patient management decisions. A negative result may occur with  improper specimen collection/handling, submission of specimen other than nasopharyngeal swab, presence of viral  mutation(s) within the areas targeted by this assay, and inadequate number of viral copies(<138 copies/mL). A negative result must be combined with clinical observations, patient history, and epidemiological information. The expected result is Negative.  Fact Sheet for Patients:  BloggerCourse.com  Fact Sheet for Healthcare Providers:  SeriousBroker.it  This test is no t yet approved or cleared by the [place] FDA and  has been authorized for detection and/or diagnosis of SARS-CoV-2 by FDA under an Emergency Use Authorization (EUA). This EUA will remain  in effect (meaning this test can be used) for the duration of the COVID-19 declaration under Section 564(b)(1) of the Act, 21 U.S.C.section 360bbb-3(b)(1), unless the authorization is terminated  or revoked sooner.       Influenza A by PCR NEGATIVE NEGATIVE Final   Influenza B by PCR NEGATIVE NEGATIVE Final    Comment: (NOTE) The Xpert Xpress SARS-CoV-2/FLU/RSV plus assay is intended as an aid in the diagnosis of influenza from Nasopharyngeal swab specimens and should not be used as a sole basis for treatment. Nasal washings and aspirates are unacceptable for Xpert Xpress SARS-CoV-2/FLU/RSV testing.  Fact Sheet for Patients: BloggerCourse.com  Fact Sheet for Healthcare Providers: SeriousBroker.it  This test is not yet approved or cleared by the [place] FDA and has been authorized for detection and/or diagnosis of SARS-CoV-2 by FDA under an Emergency Use Authorization (EUA). This EUA will remain in effect (meaning this test can be used) for the duration of the COVID-19 declaration under Section 564(b)(1) of the Act, 21 U.S.C. section 360bbb-3(b)(1), unless the authorization is terminated or revoked.  Performed at [facility], [address] [place], [place] [address]   Urine Culture     Status: Abnormal    Collection Time: 03/10/21  4:18 PM   Specimen: Urine, Clean Catch  Result Value Ref Range Status   Specimen Description   Final    URINE, CLEAN CATCH Performed at [facility], [address], [place], [place] [address]    Special Requests   Final    NONE Performed at [facility], [address], [place], [place] [address]    Culture MULTIPLE SPECIES PRESENT, SUGGEST RECOLLECTION (A)  Final   Report Status 03/12/2021 FINAL  Final     Radiology Studies: No results found.    Scheduled Meds:  cholecalciferol  1,000 Units Oral Daily   feeding supplement  237 mL Oral BID BM   folic acid  1 mg Oral Daily   metoprolol tartrate  25 mg Oral BID   multivitamin with minerals  1 tablet Oral Daily   thiamine  100 mg Oral Daily   Or   thiamine  100 mg Intravenous Daily   cyanocobalamin  1,000 mcg Oral Daily     LOS: 5 days   Shon Hale, MD [facility]  If 7PM-7AM, please contact  night-coverage [email or web address] 03/14/2021, 4:15 PM

## 2021-03-14 NOTE — Progress Notes (Signed)
Physical Therapy Treatment Patient Details Name: Larry [name] MRN: [id number] DOB: Jul 05, 1950 Today's Date: 03/14/2021   History of Present Illness Larry [name] is a 70 y.o. male with medical history significant for chronic alcohol abuse and hypertension who presented to the emergency department via EMS due to generalized weakness and recurrent falls due to ambulatory difficulty.  Patient states that he has not been able to get out of bed without falling since last 4 days due to weakness, he endorsed only little oral intake over this time.  Patient states he had a fall about 4 days ago whereby he landed on his right side and now complain of right-sided chest wall with concern for possible rib fractures.    He was recently admitted and discharged from 10/16-10/24 due to alcohol withdrawal/alcohol intoxication after which he was discharged to a rehab.  Patient left rehab after regaining some strength by signing AMA and has since returned home where he lives in a trailer alone with 6 cats and 1 dog, he has not been taking care of himself.  He activated EMS today due to ongoing weakness, on arrival of EMS team, it was noted that his house was unkempt and feces were noted on the floor.  Patient was taken to the ED for further evaluation and management.  He denies chest pain, fever, chills, headache, nausea, vomiting or abdominal pain.  Last alcohol consumption was more than 24 hours prior to admission due to being too weak to get out of bed and to get to any alcoholic drink.    PT Comments    Patient demonstrates fair/good return for sitting up at bedside with slow labored movement, increased endurance/distance for gait training using RW  with slow labored short steps and frequent verbal/tactile cueing for safety.  Patient able to ambulate to bathroom to attempt bowel movement and tolerated walking back to bedside before having to sit due to fatigue.  Patient tolerated sitting up in chair after therapy -  nursing staff notified.  Patient will benefit from continued physical therapy in hospital and recommended venue below to increase strength, balance, endurance for safe ADLs and gait.     Recommendations for follow up therapy are one component of a multi-disciplinary discharge planning process, led by the attending physician.  Recommendations may be updated based on patient status, additional functional criteria and insurance authorization.  Follow Up Recommendations  Skilled nursing-short term rehab (<3 hours/day)     Assistance Recommended at Discharge Intermittent Supervision/Assistance  Equipment Recommendations  None recommended by PT    Recommendations for Other Services       Precautions / Restrictions Precautions Precautions: Fall Restrictions Weight Bearing Restrictions: No     Mobility  Bed Mobility Overal bed mobility: Needs Assistance Bed Mobility: Supine to Sit     Supine to sit: Supervision;Min guard     General bed mobility comments: increased time, labored movement    Transfers Overall transfer level: Needs assistance Equipment used: Rolling walker (2 wheels) Transfers: Sit to/from Stand;Bed to chair/wheelchair/BSC Sit to Stand: Min assist     Step pivot transfers: Min assist     General transfer comment: fair/good return for transferring to [facility] in bathroom and to chair with slow labored movement    Ambulation/Gait Ambulation/Gait assistance: Min assist;Mod assist Gait Distance (Feet): 15 Feet Assistive device: Rolling walker (2 wheels) Gait Pattern/deviations: Decreased step length - right;Decreased step length - left;Decreased stride length;Trunk flexed Gait velocity: Decreased     General Gait Details: increased  endurance/distance with slow labored unsteady cadence and frequent verbal/tactile cueing for safety, limited mostly due to fatigue   Stairs             Wheelchair Mobility    Modified Rankin (Stroke Patients Only)        Balance Overall balance assessment: Needs assistance Sitting-balance support: Feet supported;No upper extremity supported Sitting balance-Leahy Scale: Fair Sitting balance - Comments: Fair/good seated at EOB   Standing balance support: Reliant on assistive device for balance;During functional activity;Bilateral upper extremity supported Standing balance-Leahy Scale: Poor Standing balance comment: fair/poor using RW                            Cognition Arousal/Alertness: Awake/alert Behavior During Therapy: WFL for tasks assessed/performed Overall Cognitive Status: Within Functional Limits for tasks assessed                                          Exercises General Exercises - Lower Extremity Long Arc Quad: AROM;Both;10 reps;Seated;Strengthening Hip Flexion/Marching: Seated;AROM;Strengthening;Both;10 reps Heel Raises: Seated;AROM;Strengthening;Both;10 reps    General Comments        Pertinent Vitals/Pain Pain Assessment: No/denies pain    Home Living                          Prior Function            PT Goals (current goals can now be found in the care plan section) Acute Rehab PT Goals Patient Stated Goal: Return home after rehab PT Goal Formulation: With patient Time For Goal Achievement: 03/28/21 Potential to Achieve Goals: Good Progress towards PT goals: Progressing toward goals    Frequency    Min 3X/week      PT Plan Current plan remains appropriate    Co-evaluation              AM-PAC PT "6 Clicks" Mobility   Outcome Measure  Help needed turning from your back to your side while in a flat bed without using bedrails?: A Little Help needed moving from lying on your back to sitting on the side of a flat bed without using bedrails?: A Little Help needed moving to and from a bed to a chair (including a wheelchair)?: A Lot Help needed standing up from a chair using your arms (e.g., wheelchair or bedside  chair)?: A Lot Help needed to walk in hospital room?: A Lot Help needed climbing 3-5 steps with a railing? : A Lot 6 Click Score: 14    End of Session   Activity Tolerance: Patient tolerated treatment well;Patient limited by fatigue Patient left: in chair;with call bell/phone within reach Nurse Communication: Mobility status PT Visit Diagnosis: Unsteadiness on feet (R26.81);Other abnormalities of gait and mobility (R26.89);Muscle weakness (generalized) (M62.81)     Time: 3254-9826 PT Time Calculation (min) (ACUTE ONLY): 28 min  Charges:  $Gait Training: 8-22 mins $Therapeutic Exercise: 8-22 mins                     3:46 PM, 03/14/21 Ocie Bob, MPT Physical Therapist with [facility] [phone] office [phone] mobile phone

## 2021-03-14 NOTE — TOC Progression Note (Signed)
Transition of Care [facility]) - Progression Note    Patient Details  Name: Larry [name] MRN: [id number] Date of Birth: [birth year]  [facility]) CM/SW Contact  Elliot Gault, LCSW Phone Number: 03/14/2021, 3:42 PM  Clinical Narrative:     TOC following. Debbie at [facility] states that they have received SNF auth from [employer] and they can accept pt tomorrow.   Will follow up in AM.  Expected Discharge Plan: Skilled Nursing Facility Barriers to Discharge: Other (must enter comment) (Awaiting insurance auth for SNF)  Expected Discharge Plan and Services Expected Discharge Plan: Skilled Nursing Facility In-house Referral: Clinical Social Work Discharge Planning Services: CM Consult Post Acute Care Choice: Skilled Nursing Facility Living arrangements for the past 2 months: Mobile Home Expected Discharge Date: 03/14/21                                     Social Determinants of Health (SDOH) Interventions    Readmission Risk Interventions Readmission Risk Prevention Plan 03/10/2021 02/11/2021  Medication Screening Complete -  Transportation Screening Complete Complete  Home Care Screening - Complete  Medication Review (RN CM) - Complete  Some recent data might be hidden

## 2021-03-14 NOTE — Care Management Important Message (Signed)
Important Message  Patient Details  Name: Larry [name] MRN: [id number] Date of Birth: 1951-02-27   Medicare Important Message Given:  Yes     Corey Harold 03/14/2021, 11:37 AM

## 2021-03-15 DIAGNOSIS — I10 Essential (primary) hypertension: Secondary | ICD-10-CM | POA: Diagnosis not present

## 2021-03-15 DIAGNOSIS — M6281 Muscle weakness (generalized): Secondary | ICD-10-CM | POA: Diagnosis not present

## 2021-03-15 DIAGNOSIS — M6259 Muscle wasting and atrophy, not elsewhere classified, multiple sites: Secondary | ICD-10-CM | POA: Diagnosis not present

## 2021-03-15 DIAGNOSIS — R1312 Dysphagia, oropharyngeal phase: Secondary | ICD-10-CM | POA: Diagnosis not present

## 2021-03-15 DIAGNOSIS — R262 Difficulty in walking, not elsewhere classified: Secondary | ICD-10-CM | POA: Diagnosis not present

## 2021-03-15 DIAGNOSIS — J449 Chronic obstructive pulmonary disease, unspecified: Secondary | ICD-10-CM | POA: Diagnosis not present

## 2021-03-15 DIAGNOSIS — J64 Unspecified pneumoconiosis: Secondary | ICD-10-CM | POA: Diagnosis not present

## 2021-03-15 DIAGNOSIS — R232 Flushing: Secondary | ICD-10-CM | POA: Diagnosis not present

## 2021-03-15 DIAGNOSIS — F10939 Alcohol use, unspecified with withdrawal, unspecified: Secondary | ICD-10-CM | POA: Diagnosis not present

## 2021-03-15 DIAGNOSIS — E782 Mixed hyperlipidemia: Secondary | ICD-10-CM | POA: Diagnosis not present

## 2021-03-15 DIAGNOSIS — F1090 Alcohol use, unspecified, uncomplicated: Secondary | ICD-10-CM | POA: Diagnosis not present

## 2021-03-15 DIAGNOSIS — F10931 Alcohol use, unspecified with withdrawal delirium: Secondary | ICD-10-CM | POA: Diagnosis not present

## 2021-03-15 DIAGNOSIS — F10920 Alcohol use, unspecified with intoxication, uncomplicated: Secondary | ICD-10-CM | POA: Diagnosis not present

## 2021-03-15 DIAGNOSIS — E872 Acidosis, unspecified: Secondary | ICD-10-CM | POA: Diagnosis not present

## 2021-03-15 DIAGNOSIS — E46 Unspecified protein-calorie malnutrition: Secondary | ICD-10-CM | POA: Diagnosis not present

## 2021-03-15 DIAGNOSIS — F10930 Alcohol use, unspecified with withdrawal, uncomplicated: Secondary | ICD-10-CM | POA: Diagnosis not present

## 2021-03-15 DIAGNOSIS — R627 Adult failure to thrive: Secondary | ICD-10-CM | POA: Diagnosis not present

## 2021-03-15 DIAGNOSIS — R5381 Other malaise: Secondary | ICD-10-CM | POA: Diagnosis not present

## 2021-03-15 DIAGNOSIS — D649 Anemia, unspecified: Secondary | ICD-10-CM | POA: Diagnosis not present

## 2021-03-15 DIAGNOSIS — D696 Thrombocytopenia, unspecified: Secondary | ICD-10-CM | POA: Diagnosis not present

## 2021-03-15 DIAGNOSIS — E039 Hypothyroidism, unspecified: Secondary | ICD-10-CM | POA: Diagnosis not present

## 2021-03-15 DIAGNOSIS — E860 Dehydration: Secondary | ICD-10-CM | POA: Diagnosis not present

## 2021-03-15 DIAGNOSIS — R2689 Other abnormalities of gait and mobility: Secondary | ICD-10-CM | POA: Diagnosis not present

## 2021-03-15 DIAGNOSIS — R296 Repeated falls: Secondary | ICD-10-CM | POA: Diagnosis not present

## 2021-03-15 DIAGNOSIS — F10929 Alcohol use, unspecified with intoxication, unspecified: Secondary | ICD-10-CM | POA: Diagnosis not present

## 2021-03-15 DIAGNOSIS — Z79899 Other long term (current) drug therapy: Secondary | ICD-10-CM | POA: Diagnosis not present

## 2021-03-15 DIAGNOSIS — R531 Weakness: Secondary | ICD-10-CM | POA: Diagnosis not present

## 2021-03-15 LAB — COMPREHENSIVE METABOLIC PANEL
ALT: 18 U/L (ref 0–44)
AST: 19 U/L (ref 15–41)
Albumin: 2.8 g/dL — ABNORMAL LOW (ref 3.5–5.0)
Alkaline Phosphatase: 92 U/L (ref 38–126)
Anion gap: 7 (ref 5–15)
BUN: 19 mg/dL (ref 8–23)
CO2: 27 mmol/L (ref 22–32)
Calcium: 8.8 mg/dL — ABNORMAL LOW (ref 8.9–10.3)
Chloride: 102 mmol/L (ref 98–111)
Creatinine, Ser: 0.67 mg/dL (ref 0.61–1.24)
GFR, Estimated: >60 mL/min (ref >60)
Glucose, Bld: 101 mg/dL — ABNORMAL HIGH (ref 70–99)
Potassium: 4.1 mmol/L (ref 3.5–5.1)
Sodium: 136 mmol/L (ref 135–145)
Total Bilirubin: 0.4 mg/dL (ref 0.3–1.2)
Total Protein: 5.8 g/dL — ABNORMAL LOW (ref 6.5–8.1)

## 2021-03-15 LAB — CBC
HCT: 32.7 % — ABNORMAL LOW (ref 39.0–52.0)
Hemoglobin: 10.8 g/dL — ABNORMAL LOW (ref 13.0–17.0)
MCH: 34.1 pg — ABNORMAL HIGH (ref 26.0–34.0)
MCHC: 33 g/dL (ref 30.0–36.0)
MCV: 103.2 fL — ABNORMAL HIGH (ref 80.0–100.0)
Platelets: 193 10*3/uL (ref 150–400)
RBC: 3.17 MIL/uL — ABNORMAL LOW (ref 4.22–5.81)
RDW: 13.2 % (ref 11.5–15.5)
WBC: 5.2 10*3/uL (ref 4.0–10.5)
nRBC: 0 % (ref 0.0–0.2)

## 2021-03-15 LAB — MAGNESIUM: Magnesium: 1.7 mg/dL (ref 1.7–2.4)

## 2021-03-15 MED ORDER — THIAMINE HCL 100 MG PO TABS: 100.0000 mg | ORAL_TABLET | Freq: Every day | ORAL | 3 refills | Status: DC

## 2021-03-15 MED ORDER — ADULT MULTIVITAMIN W/MINERALS CH: 1.0000 | ORAL_TABLET | Freq: Every day | ORAL | 5 refills | Status: DC

## 2021-03-15 MED ORDER — SENNOSIDES-DOCUSATE SODIUM 8.6-50 MG PO TABS: 2.0000 | ORAL_TABLET | Freq: Every day | ORAL | 3 refills | Status: DC

## 2021-03-15 MED ORDER — FOLIC ACID 1 MG PO TABS: 1.0000 mg | ORAL_TABLET | Freq: Every day | ORAL | 3 refills | Status: DC

## 2021-03-15 MED ORDER — METOPROLOL TARTRATE 25 MG PO TABS: 25.0000 mg | ORAL_TABLET | Freq: Two times a day (BID) | ORAL | 5 refills | Status: DC

## 2021-03-15 MED ORDER — CYANOCOBALAMIN 1000 MCG PO TABS: 1000.0000 ug | ORAL_TABLET | Freq: Every day | ORAL | 5 refills | Status: DC

## 2021-03-15 MED ORDER — TRAZODONE HCL 50 MG PO TABS
25.0000 mg | ORAL_TABLET | Freq: Every day | ORAL | 2 refills | Status: DC
Start: 2021-03-15 — End: 2021-11-02

## 2021-03-15 MED ORDER — METOPROLOL TARTRATE 25 MG PO TABS: 12.5000 mg | ORAL_TABLET | Freq: Two times a day (BID) | ORAL | 5 refills | Status: DC

## 2021-03-15 NOTE — Progress Notes (Signed)
Patient discharged to [facility] called and given to [facility].Transported by Juel Burrow to awaiting facility.

## 2021-03-15 NOTE — TOC Transition Note (Signed)
Transition of Care [facility]) - CM/SW Discharge Note   Patient Details  Name: JANE BROUGHTON MRN: [id number] Date of Birth: Sep 07, 1950  Transition of Care [facility]) CM/SW Contact:  Elliot Gault, LCSW Phone Number: 03/15/2021, 12:03 PM   Clinical Narrative:     Pt stable for dc today per MD. Pt has authorization and Debbie at [facility] states they can admit today. DC clinical will be sent electronically. RN to call report. Transport arranged with [facility] w/c [facility] Niece. There are no other TOC needs for dc.  Final next level of care: Skilled Nursing Facility Barriers to Discharge: Barriers Resolved   Patient Goals and CMS Choice Patient states their goals for this hospitalization and ongoing recovery are:: Go to SNF CMS Medicare.gov Compare Post Acute Care list provided to:: Patient Choice offered to / list presented to : Patient  Discharge Placement              Patient chooses bed at: Other - please specify in the comment section below: ([facility]) Patient to be transferred to facility by: w/c Zenaida Niece Name of family member notified: pt only Patient and family notified of of transfer: 03/15/21  Discharge Plan and Services In-house Referral: Clinical Social Work Discharge Planning Services: CM Consult Post Acute Care Choice: Skilled Nursing Facility                               Social Determinants of Health (SDOH) Interventions     Readmission Risk Interventions Readmission Risk Prevention Plan 03/10/2021 02/11/2021  Medication Screening Complete -  Transportation Screening Complete Complete  Home Care Screening - Complete  Medication Review (RN CM) - Complete  Some recent data might be hidden

## 2021-03-15 NOTE — Discharge Summary (Signed)
ZIMRI BRENNEN, is a 70 y.o. male  DOB October 30, 1950  MRN [id number].  Admission date:  03/09/2021  Admitting Physician  Frankey Shown, DO  Discharge Date:  03/15/2021   Primary MD  Benita Stabile, MD  Recommendations for primary care physician for things to follow:   1)Avoid ibuprofen/Advil/Aleve/Motrin/Goody Powders/Naproxen/BC powders/Meloxicam/Diclofenac/Indomethacin and other Nonsteroidal anti-inflammatory medications as these will make you more likely to bleed and can cause stomach ulcers, can also cause Kidney problems.   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] Alcohol withdrawal syndrome with complication (HCC) [F10.939]    Principal Problem:   Alcohol withdrawal (HCC) Active Problems:   Falls frequently   Essential hypertension   Alcohol intoxication (HCC)   Generalized weakness   Elevated troponin   Failure to thrive (child)   Physical deconditioning   Elevated AST (SGOT)   Thrombocytopenia (HCC)   Elevated MCV   High anion gap metabolic acidosis   Dehydration   Pneumoconiosis, coal, 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[facility])   Eye discharge   Protein-calorie malnutrition, severe      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   Hypertension    Shingles    Shortness of breath dyspnea    SOB with exertion   Thyroid disease    hypothyroidism    Past Surgical History:  Procedure Laterality Date   CATARACT EXTRACTION W/ INTRAOCULAR LENS  IMPLANT, BILATERAL     KNEE SURGERY     ORIF  HUMERUS FRACTURE Right 09/24/2014   Procedure: OPEN REDUCTION INTERNAL FIXATION (ORIF) PROXIMAL HUMERUS FRACTURE;  Surgeon: Jones Broom, MD;  Location: [facility] OR;  Service: Orthopedics;  Laterality: Right;   TONSILLECTOMY       HPI  from the history and physical done on the day of admission:   Chief Complaint: Weakness and recurrent falls   HPI: JAYMIEN LANDIN is a 70 y.o. male with medical history significant for chronic alcohol abuse and hypertension who presented to the emergency department via EMS due to generalized weakness and recurrent falls due to ambulatory difficulty.  Patient states that he has not been able to get out of bed without falling since last 4 days due to weakness, he endorsed only little oral intake over this time.  Patient states he had a fall about 4 days ago whereby he landed on his right side and now complain of right-sided chest wall with concern for possible rib fractures.   He was recently admitted and discharged from 10/16-10/24 due to alcohol withdrawal/alcohol intoxication after which he was discharged to a rehab.  Patient left rehab after regaining some strength by signing AMA and has since returned home where he  lives in a trailer alone with 6 cats and 1 dog, he has not been taking care of himself.  He activated EMS today due to ongoing weakness, on arrival of EMS team, it was noted that his house was unkempt and feces were noted on the floor.  Patient was taken to the ED for further evaluation and management.  He denies chest pain, fever, chills, headache, nausea, vomiting or abdominal pain.  Last alcohol consumption was more than 24 hours prior to admission due to being too weak to get out of bed and to get to any alcoholic drink.   ED Course:  In the emergency department, he was intermittently tachycardic, BP was 169/79, other vital signs were within normal range.  Work-up in the ED showed normal CBC except thrombocytopenia and MCV of 100.5, BMP showed acidosis with  bicarb of 18, AST 57 and anion gap of 23, troponin x2- 24 > 47. Chest x-ray showed no acute right rib fractures and no acute cardiopulmonary process.  Calcified granulomas throughout both lungs. Right elbow x-ray showed no acute osseous abnormality Lisinopril was given due to elevated BP, Ativan was given and patient was provided with IV hydration.  Hospitalist was asked to admit patient for further evaluation and management.       Hospital Course:     Brief Narrative:    Patient 70 year old gentleman history of chronic alcohol use, hypertension presented to the ED with generalized weakness recurrent falls due to ambulatory difficulty.  Patient with difficulty getting out of bed over the past 4 days due to weakness with decreased oral intake.  Patient noted to have had a fall 4 days prior to admission landing on his right side complain of right-sided chest wall pain and was concerned for rib fractures.  Rib film chest x-ray done negative for any rib fractures or infiltrate.  Patient noted on admission to have some intermittent tachycardia, elevated blood pressure.  Patient admitted placed on the Ativan withdrawal protocol and is now stable on Librium taper.  PT consulted-with recommendations for SNF/rehab on discharge.  Currently awaiting placement.   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  AST of 57, ALT of 31. -Treated with Librium detox protocol -Also received  lorazepam per CIWA protocol along with thiamine and folic acid and multivitamin ---Delirium tremens symptoms resolved - 2)High anion gap metabolic acidosis secondary to alcohol/starvation ketoacidosis -Patient noted to have anion gap of 23, bicarb of 18 on admission and hydrated with IV fluids. -Improved with hydration--anion gap is down to 7, bicarb is up to 27  3)HTN--okay to discontinue lisinopril and HCTZ, reduce Lopressor to 12.5 mg twice daily  4.  Frequent falls due to ambulatory dysfunction secondary to generalized weakness/failure to thrive/physical deconditioning -Fall precautions. -PT/OT has seen patient and recommended skilled nursing facility for rehabilitation and conditioning.  5) protein caloric malnutrition in the setting of alcohol abuse and poor nutritional intake-- -Body mass index is 17.33 kg/m. -Feeding supplements  are recommended.  6) Elevated troponin secondary to type II demand ischemia -Troponins seem to be flattened. - denies any chest pain, shortness of breath or dizziness Asymptomatic continue low-dose metoprolol  7.  Elevated MCV -Due to EtOH abuse -Vitamin B12 696, folate at 7.5. -Hemoglobin stable at 11.5.  8.  Pneumoconiosis -Chest x-ray with calcified granulomas throughout both lungs. -Patient endorsed to admitting physician having coworkers lung disease. -Asymptomatic, stable. -Outpatient follow-up with pulmonology recommended.  9.  Thrombocytopenia-stable -Suspect secondary to toxic effect of alcohol on bone marrow -No overt bleeding appreciated. -Platelets have normalized with abstinence from alcohol  10.  Eye discharge/? allergic conjunctivitis -Mostly resolved -Patient educated about importance of not touching his eyes.   Code Status: Full Family Communication: Updated patient.  Patient's son visited on 03/14/2021 disposition: To SNF     Consultants:  None   Procedures:  Chest x-ray with rib films 03/09/2021 Plain films of  the right elbow 03/09/2021   Antimicrobials:  None  Discharge Condition: stable  Diet and Activity recommendation:  As advised  Discharge Instructions    Discharge Instructions     Call MD for:  difficulty breathing, headache or visual disturbances   Complete by: As directed    Call MD for:  persistant dizziness or light-headedness   Complete by: As directed    Call MD for:  persistant nausea and vomiting   Complete by: As directed    Call MD for:  severe uncontrolled pain   Complete by: As directed    Call MD for:  temperature >100.4   Complete by: As directed    Diet - low sodium heart healthy   Complete by: As directed    Discharge instructions   Complete by: As directed    1)Avoid ibuprofen/Advil/Aleve/Motrin/Goody Powders/Naproxen/BC powders/Meloxicam/Diclofenac/Indomethacin and other Nonsteroidal anti-inflammatory medications as these will make you more likely to bleed and can cause stomach ulcers, can also cause Kidney problems.   2)Complete Abstinence from Alcohol advised  3)Repeat CBC and CMP on Monday  03/21/21   Increase activity slowly   Complete by: As directed         Discharge Medications     Allergies as of 03/15/2021   No Known Allergies      Medication List     STOP taking these medications    lisinopril-hydrochlorothiazide 10-12.5 MG tablet Commonly known as: ZESTORETIC       TAKE these medications    acetaminophen 325 MG tablet Commonly known as: TYLENOL Take 2 tablets (650 mg total) by mouth every 6 (six) hours as needed for mild pain or headache (or Fever >/= 101).   cholecalciferol 25 MCG (1000 UNIT) tablet Commonly known as: VITAMIN D3 Take 1,000 Units by mouth daily.   cyanocobalamin 1000 MCG tablet Take 1 tablet (1,000 mcg total) by mouth daily. What changed: when to take this   folic acid 1 MG tablet Commonly known as: FOLVITE Take 1 tablet (1 mg total) by mouth daily. Start taking on: March 16, 2021    metoprolol tartrate 25 MG tablet Commonly known as: LOPRESSOR Take 0.5 tablets (12.5 mg total) by mouth 2 (two) times daily. What changed: how much to take   multivitamin with minerals Tabs tablet Take 1 tablet by mouth daily. Start taking on: March 16, 2021   senna-docusate 8.6-50 MG tablet Commonly known as: Senokot-S Take 2 tablets by mouth at bedtime. What changed:  how much to take when to take this reasons to take this   thiamine  100 MG tablet Take 1 tablet (100 mg total) by mouth daily.   traZODone 50 MG tablet Commonly known as: DESYREL Take 0.5 tablets (25 mg total) by mouth at bedtime. What changed:  when to take this reasons to take this        Major procedures and Radiology Reports - PLEASE review detailed and final reports for all details, in brief -    DG Ribs Unilateral W/Chest Right  Result Date: 03/09/2021 CLINICAL DATA:  Fall with right chest pain. EXAM: RIGHT RIBS AND CHEST - 3+ VIEW COMPARISON:  Chest x-ray 08/29/2020. FINDINGS: No acute fracture or other bone lesions are seen involving the ribs. There are chronic right 4th-8th rib fractures. ORIF proximal right humeral fracture is unchanged. There is no evidence of pneumothorax or pleural effusion. Numerous bilateral calcified nodular densities and calcified pleural plaques are unchanged. There is no focal lung infiltrate. Heart size and mediastinal contours are within normal limits. IMPRESSION: 1. No acute right rib fractures. 2. No acute cardiopulmonary process. 3. Calcified granulomas throughout both lungs. Electronically Signed   By: Darliss Cheney M.D.   On: 03/09/2021 15:06   DG Elbow Complete Right  Result Date: 03/09/2021 CLINICAL DATA:  Fall. EXAM: RIGHT ELBOW - COMPLETE 3+ VIEW COMPARISON:  None. FINDINGS: There is no evidence of fracture, dislocation, or joint effusion. There is no evidence of arthropathy or other focal bone abnormality. Osteopenia. Soft tissues are unremarkable.  IMPRESSION: 1.  No acute osseous abnormality. Electronically Signed   By: Obie Dredge M.D.   On: 03/09/2021 15:04    Micro Results   Recent Results (from the past 240 hour(s))  Resp Panel by RT-PCR (Flu A&B, Covid) Nasopharyngeal Swab     Status: None   Collection Time: 03/09/21  8:40 PM   Specimen: Nasopharyngeal Swab; Nasopharyngeal(NP) swabs in vial transport medium  Result Value Ref Range Status   SARS Coronavirus 2 by RT PCR NEGATIVE NEGATIVE Final    Comment: (NOTE) SARS-CoV-2 target nucleic acids are NOT DETECTED.  The SARS-CoV-2 RNA is generally detectable in upper respiratory specimens during the acute phase of infection. The lowest concentration of SARS-CoV-2 viral copies this assay can detect is 138 copies/mL. A negative result does not preclude SARS-Cov-2 infection and should not be used as the sole basis for treatment or other patient management decisions. A negative result may occur with  improper specimen collection/handling, submission of specimen other than nasopharyngeal swab, presence of viral mutation(s) within the areas targeted by this assay, and inadequate number of viral copies(<138 copies/mL). A negative result must be combined with clinical observations, patient history, and epidemiological information. The expected result is Negative.  Fact Sheet for Patients:  BloggerCourse.com  Fact Sheet for Healthcare Providers:  SeriousBroker.it  This test is no t yet approved or cleared by the [place] FDA and  has been authorized for detection and/or diagnosis of SARS-CoV-2 by FDA under an Emergency Use Authorization (EUA). This EUA will remain  in effect (meaning this test can be used) for the duration of the COVID-19 declaration under Section 564(b)(1) of the Act, 21 U.S.C.section 360bbb-3(b)(1), unless the authorization is terminated  or revoked sooner.       Influenza A by PCR NEGATIVE NEGATIVE  Final   Influenza B by PCR NEGATIVE NEGATIVE Final    Comment: (NOTE) The Xpert Xpress SARS-CoV-2/FLU/RSV plus assay is intended as an aid in the diagnosis of influenza from Nasopharyngeal swab specimens and should not be used as a sole basis for  treatment. Nasal washings and aspirates are unacceptable for Xpert Xpress SARS-CoV-2/FLU/RSV testing.  Fact Sheet for Patients: BloggerCourse.com  Fact Sheet for Healthcare Providers: SeriousBroker.it  This test is not yet approved or cleared by the [place] FDA and has been authorized for detection and/or diagnosis of SARS-CoV-2 by FDA under an Emergency Use Authorization (EUA). This EUA will remain in effect (meaning this test can be used) for the duration of the COVID-19 declaration under Section 564(b)(1) of the Act, 21 U.S.C. section 360bbb-3(b)(1), unless the authorization is terminated or revoked.  Performed at [facility], [address], [place], [place] [address]   Urine Culture     Status: Abnormal   Collection Time: 03/10/21  4:18 PM   Specimen: Urine, Clean Catch  Result Value Ref Range Status   Specimen Description   Final    URINE, CLEAN CATCH Performed at [facility], [address], [place], [place] [address]    Special Requests   Final    NONE Performed at [facility], [address], [place], [place] [address]    Culture MULTIPLE SPECIES PRESENT, SUGGEST RECOLLECTION (A)  Final   Report Status 03/12/2021 FINAL  Final       Today   Subjective    Chales Pelissier today has no new concerns  No Nausea, Vomiting or Diarrhea No fever  Or chills          Patient has been seen and examined prior to discharge   Objective   Blood pressure 103/65, pulse 90, temperature (!) 97 F (36.1 C), resp. rate 16, height 6\' 2"  (1.88 m), weight 61.2 kg, SpO2 97 %.   Intake/Output Summary (Last 24 hours) at 03/15/2021 1203 Last data filed at 03/15/2021 1051 Gross  per 24 hour  Intake 960 ml  Output 600 ml  Net 360 ml    Exam Gen:- Awake Alert, no acute distress  HEENT:- [place].AT, No sclera icterus Neck-Supple Neck,No JVD,.  Lungs-  CTAB , good air movement bilaterally  CV- S1, S2 normal, regular Abd-  +ve B.Sounds, Abd Soft, No tenderness,    Extremity/Skin:- No  edema,   good pulses Psych-affect is appropriate, oriented x3, cooperative and interactive Neuro-generalized weakness, no new focal deficits, no tremors    Data Review   CBC w Diff:  Lab Results  Component Value Date   WBC 5.2 03/15/2021   HGB 10.8 (L) 03/15/2021   HCT 32.7 (L) 03/15/2021   PLT 193 03/15/2021   LYMPHOPCT 36 03/11/2021   MONOPCT 9 03/11/2021   EOSPCT 3 03/11/2021   BASOPCT 1 03/11/2021    CMP:  Lab Results  Component Value Date   NA 136 03/15/2021   K 4.1 03/15/2021   CL 102 03/15/2021   CO2 27 03/15/2021   BUN 19 03/15/2021   CREATININE 0.67 03/15/2021   PROT 5.8 (L) 03/15/2021   ALBUMIN 2.8 (L) 03/15/2021   BILITOT 0.4 03/15/2021   ALKPHOS 92 03/15/2021   AST 19 03/15/2021   ALT 18 03/15/2021    Total Discharge time is about 33 minutes  03/17/2021 M.D on 03/15/2021 at 12:03 PM  Go to [email or web address] -  for contact info  [facility] - Office  [phone]

## 2021-03-15 NOTE — Plan of Care (Signed)

## 2021-03-15 NOTE — Progress Notes (Signed)
Nsg Discharge Note  Admit Date:  03/09/2021 Discharge date: 03/15/2021   JSHON IBE to be D/C'd Skilled nursing facility per MD order.  AVS completed.  Copy for chart, and copy for patient signed, and dated. Patient/caregiver able to verbalize understanding.  Discharge Medication: Allergies as of 03/15/2021   No Known Allergies      Medication List     STOP taking these medications    lisinopril-hydrochlorothiazide 10-12.5 MG tablet Commonly known as: ZESTORETIC       TAKE these medications    acetaminophen 325 MG tablet Commonly known as: TYLENOL Take 2 tablets (650 mg total) by mouth every 6 (six) hours as needed for mild pain or headache (or Fever >/= 101).   cholecalciferol 25 MCG (1000 UNIT) tablet Commonly known as: VITAMIN D3 Take 1,000 Units by mouth daily.   cyanocobalamin 1000 MCG tablet Take 1 tablet (1,000 mcg total) by mouth daily. What changed: when to take this   folic acid 1 MG tablet Commonly known as: FOLVITE Take 1 tablet (1 mg total) by mouth daily. Start taking on: March 16, 2021   metoprolol tartrate 25 MG tablet Commonly known as: LOPRESSOR Take 0.5 tablets (12.5 mg total) by mouth 2 (two) times daily. What changed: how much to take   multivitamin with minerals Tabs tablet Take 1 tablet by mouth daily. Start taking on: March 16, 2021   senna-docusate 8.6-50 MG tablet Commonly known as: Senokot-S Take 2 tablets by mouth at bedtime. What changed:  how much to take when to take this reasons to take this   thiamine 100 MG tablet Take 1 tablet (100 mg total) by mouth daily.   traZODone 50 MG tablet Commonly known as: DESYREL Take 0.5 tablets (25 mg total) by mouth at bedtime. What changed:  when to take this reasons to take this        Discharge Assessment: Vitals:   03/15/21 0423 03/15/21 1057  BP: 119/61 103/65  Pulse: 79 90  Resp: 16   Temp: (!) 97 F (36.1 C)   SpO2: 97%    Skin clean, dry and intact  without evidence of skin break down, no evidence of skin tears noted. IV catheter discontinued intact. Site without signs and symptoms of complications - no redness or edema noted at insertion site, patient denies c/o pain - only slight tenderness at site.  Dressing with slight pressure applied.  D/c Instructions-Education: Discharge instructions given to patient/family with verbalized understanding. D/c education completed with patient/family including follow up instructions, medication list, d/c activities limitations if indicated, with other d/c instructions as indicated by MD - patient able to verbalize understanding, all questions fully answered. Patient instructed to return to ED, call 911, or call MD for any changes in condition.  Patient escorted via WC, and D/C home via private auto.  Brandy Hale, LPN 16/01/9603 5:40 PM

## 2021-03-15 NOTE — Discharge Instructions (Signed)
1)Avoid ibuprofen/Advil/Aleve/Motrin/Goody Powders/Naproxen/BC powders/Meloxicam/Diclofenac/Indomethacin and other Nonsteroidal anti-inflammatory medications as these will make you more likely to bleed and can cause stomach ulcers, can also cause Kidney problems.   2)Complete Abstinence from Alcohol advised  3)Repeat CBC and CMP on Monday  03/21/21

## 2021-03-18 DIAGNOSIS — J449 Chronic obstructive pulmonary disease, unspecified: Secondary | ICD-10-CM | POA: Diagnosis not present

## 2021-03-18 DIAGNOSIS — I10 Essential (primary) hypertension: Secondary | ICD-10-CM | POA: Diagnosis not present

## 2021-03-18 DIAGNOSIS — F1090 Alcohol use, unspecified, uncomplicated: Secondary | ICD-10-CM | POA: Diagnosis not present

## 2021-03-18 DIAGNOSIS — R531 Weakness: Secondary | ICD-10-CM | POA: Diagnosis not present

## 2021-03-18 DIAGNOSIS — E039 Hypothyroidism, unspecified: Secondary | ICD-10-CM | POA: Diagnosis not present

## 2021-03-18 DIAGNOSIS — Z79899 Other long term (current) drug therapy: Secondary | ICD-10-CM | POA: Diagnosis not present

## 2021-03-18 DIAGNOSIS — F10939 Alcohol use, unspecified with withdrawal, unspecified: Secondary | ICD-10-CM | POA: Diagnosis not present

## 2021-03-21 DIAGNOSIS — D649 Anemia, unspecified: Secondary | ICD-10-CM | POA: Diagnosis not present

## 2021-03-21 DIAGNOSIS — F10929 Alcohol use, unspecified with intoxication, unspecified: Secondary | ICD-10-CM | POA: Diagnosis not present

## 2021-03-21 DIAGNOSIS — F10931 Alcohol use, unspecified with withdrawal delirium: Secondary | ICD-10-CM | POA: Diagnosis not present

## 2021-03-21 DIAGNOSIS — J64 Unspecified pneumoconiosis: Secondary | ICD-10-CM | POA: Diagnosis not present

## 2021-03-21 DIAGNOSIS — I10 Essential (primary) hypertension: Secondary | ICD-10-CM | POA: Diagnosis not present

## 2021-03-21 DIAGNOSIS — E46 Unspecified protein-calorie malnutrition: Secondary | ICD-10-CM | POA: Diagnosis not present

## 2021-03-21 DIAGNOSIS — E872 Acidosis, unspecified: Secondary | ICD-10-CM | POA: Diagnosis not present

## 2021-03-21 DIAGNOSIS — J449 Chronic obstructive pulmonary disease, unspecified: Secondary | ICD-10-CM | POA: Diagnosis not present

## 2021-03-23 DIAGNOSIS — I10 Essential (primary) hypertension: Secondary | ICD-10-CM | POA: Diagnosis not present

## 2021-03-23 DIAGNOSIS — E782 Mixed hyperlipidemia: Secondary | ICD-10-CM | POA: Diagnosis not present

## 2021-03-25 ENCOUNTER — Other Ambulatory Visit: Payer: Self-pay

## 2021-03-25 NOTE — Patient Outreach (Signed)
[facility]) Care Management  03/25/2021  Larry [name] Aug 23, 1950 [id number]   Referral Date: 03/25/21 Referral Source: [employer] Report Date of Discharge: 03/24/21 Facility: [facility] Health Insurance: [facility]  Outreach attempt: No answer.  Unable to leave a message.    Plan: RN CM will attempt again within 4 business days and send letter.     [name], RN, MSN [facility] Care Management Care Management Coordinator Direct Line [phone] Toll Free: [phone]  Fax: [phone]

## 2021-03-26 DIAGNOSIS — E876 Hypokalemia: Secondary | ICD-10-CM | POA: Diagnosis not present

## 2021-03-26 DIAGNOSIS — E039 Hypothyroidism, unspecified: Secondary | ICD-10-CM | POA: Diagnosis not present

## 2021-03-26 DIAGNOSIS — I10 Essential (primary) hypertension: Secondary | ICD-10-CM | POA: Diagnosis not present

## 2021-03-26 DIAGNOSIS — F17200 Nicotine dependence, unspecified, uncomplicated: Secondary | ICD-10-CM | POA: Diagnosis not present

## 2021-03-26 DIAGNOSIS — F10939 Alcohol use, unspecified with withdrawal, unspecified: Secondary | ICD-10-CM | POA: Diagnosis not present

## 2021-03-26 DIAGNOSIS — J449 Chronic obstructive pulmonary disease, unspecified: Secondary | ICD-10-CM | POA: Diagnosis not present

## 2021-03-28 ENCOUNTER — Other Ambulatory Visit: Payer: Self-pay

## 2021-03-28 NOTE — Patient Outreach (Signed)
[facility]) Care Management  03/28/2021  Larry [name] 08-Nov-1950 [id number]   Referral Date: 03/25/21 Referral Source: [employer] Report Date of Discharge: 03/24/21 Facility: [facility] Health Insurance: [facility]   Outreach attempt: No answer.  Unable to leave a message.     Plan: RN CM will attempt again within 4 business days.       [name], RN, MSN [facility] Care Management Care Management Coordinator Direct Line [phone] Toll Free: [phone]  Fax: [phone]

## 2021-03-29 ENCOUNTER — Other Ambulatory Visit: Payer: Self-pay

## 2021-03-29 NOTE — Patient Outreach (Signed)
[facility]) Care Management  03/29/2021  Larry [name] Jul 16, 1950 [id number]   Referral Date: 03/25/21 Referral Source: [employer] Report Date of Discharge: 03/24/21 Facility: [facility] Health Insurance: [facility]   Outreach attempt: No answer.  Unable to leave a message.     Plan: RN CM will attempt again in 3 weeks.  [name], RN, MSN [facility] Care Management Care Management Coordinator Direct Line [phone] Cell [phone] Toll Free: [phone]  Fax: [phone]

## 2021-04-07 DIAGNOSIS — I10 Essential (primary) hypertension: Secondary | ICD-10-CM | POA: Diagnosis not present

## 2021-04-07 DIAGNOSIS — J64 Unspecified pneumoconiosis: Secondary | ICD-10-CM | POA: Diagnosis not present

## 2021-04-07 DIAGNOSIS — F1010 Alcohol abuse, uncomplicated: Secondary | ICD-10-CM | POA: Diagnosis not present

## 2021-04-15 ENCOUNTER — Other Ambulatory Visit: Payer: Self-pay

## 2021-04-15 NOTE — Patient Outreach (Signed)
[facility]) Care Management  04/15/2021  Larry [name] 24-Nov-1950 [id number]   Referral Date: 03/25/21 Referral Source: [employer] Report Date of Discharge: 03/24/21 Facility: [facility] Health Insurance: [facility]   Outreach attempt: No answer.  Unable to leave a message.     Plan: RN CM will close case.     [name], RN, MSN [facility] Care Management Care Management Coordinator Direct Line [phone] Cell [phone] Toll Free: [phone]  Fax: [phone]

## 2021-07-28 DIAGNOSIS — T1490XA Injury, unspecified, initial encounter: Secondary | ICD-10-CM | POA: Diagnosis not present

## 2021-07-28 DIAGNOSIS — R5381 Other malaise: Secondary | ICD-10-CM | POA: Diagnosis not present

## 2021-07-28 DIAGNOSIS — W19XXXA Unspecified fall, initial encounter: Secondary | ICD-10-CM | POA: Diagnosis not present

## 2021-10-13 ENCOUNTER — Emergency Department (HOSPITAL_COMMUNITY): Payer: Medicare HMO

## 2021-10-13 ENCOUNTER — Inpatient Hospital Stay (HOSPITAL_COMMUNITY)
Admission: EM | Admit: 2021-10-13 | Discharge: 2021-11-02 | DRG: 947 | Disposition: A | Discharge status: Skilled Nursing Facility | Payer: Medicare HMO | Attending: Internal Medicine | Admitting: Internal Medicine

## 2021-10-13 ENCOUNTER — Encounter (HOSPITAL_COMMUNITY): Payer: Self-pay | Admitting: Emergency Medicine

## 2021-10-13 ENCOUNTER — Other Ambulatory Visit: Payer: Self-pay

## 2021-10-13 DIAGNOSIS — E876 Hypokalemia: Secondary | ICD-10-CM | POA: Diagnosis not present

## 2021-10-13 DIAGNOSIS — F17210 Nicotine dependence, cigarettes, uncomplicated: Secondary | ICD-10-CM | POA: Diagnosis present

## 2021-10-13 DIAGNOSIS — J9601 Acute respiratory failure with hypoxia: Secondary | ICD-10-CM | POA: Diagnosis not present

## 2021-10-13 DIAGNOSIS — G4489 Other headache syndrome: Secondary | ICD-10-CM | POA: Diagnosis not present

## 2021-10-13 DIAGNOSIS — K449 Diaphragmatic hernia without obstruction or gangrene: Secondary | ICD-10-CM | POA: Diagnosis not present

## 2021-10-13 DIAGNOSIS — T17908D Unspecified foreign body in respiratory tract, part unspecified causing other injury, subsequent encounter: Secondary | ICD-10-CM | POA: Diagnosis not present

## 2021-10-13 DIAGNOSIS — I10 Essential (primary) hypertension: Secondary | ICD-10-CM | POA: Diagnosis not present

## 2021-10-13 DIAGNOSIS — S32020D Wedge compression fracture of second lumbar vertebra, subsequent encounter for fracture with routine healing: Secondary | ICD-10-CM | POA: Diagnosis not present

## 2021-10-13 DIAGNOSIS — K7010 Alcoholic hepatitis without ascites: Secondary | ICD-10-CM | POA: Diagnosis present

## 2021-10-13 DIAGNOSIS — R52 Pain, unspecified: Secondary | ICD-10-CM

## 2021-10-13 DIAGNOSIS — W57XXXA Bitten or stung by nonvenomous insect and other nonvenomous arthropods, initial encounter: Secondary | ICD-10-CM

## 2021-10-13 DIAGNOSIS — R7401 Elevation of levels of liver transaminase levels: Secondary | ICD-10-CM

## 2021-10-13 DIAGNOSIS — R471 Dysarthria and anarthria: Secondary | ICD-10-CM | POA: Diagnosis not present

## 2021-10-13 DIAGNOSIS — R0603 Acute respiratory distress: Secondary | ICD-10-CM | POA: Diagnosis not present

## 2021-10-13 DIAGNOSIS — R718 Other abnormality of red blood cells: Secondary | ICD-10-CM | POA: Diagnosis not present

## 2021-10-13 DIAGNOSIS — E039 Hypothyroidism, unspecified: Secondary | ICD-10-CM | POA: Diagnosis present

## 2021-10-13 DIAGNOSIS — E43 Unspecified severe protein-calorie malnutrition: Secondary | ICD-10-CM | POA: Diagnosis not present

## 2021-10-13 DIAGNOSIS — R059 Cough, unspecified: Secondary | ICD-10-CM | POA: Diagnosis not present

## 2021-10-13 DIAGNOSIS — W19XXXA Unspecified fall, initial encounter: Secondary | ICD-10-CM | POA: Diagnosis not present

## 2021-10-13 DIAGNOSIS — Z681 Body mass index (BMI) 19 or less, adult: Secondary | ICD-10-CM

## 2021-10-13 DIAGNOSIS — R338 Other retention of urine: Secondary | ICD-10-CM | POA: Diagnosis not present

## 2021-10-13 DIAGNOSIS — E440 Moderate protein-calorie malnutrition: Secondary | ICD-10-CM

## 2021-10-13 DIAGNOSIS — R5381 Other malaise: Secondary | ICD-10-CM | POA: Diagnosis not present

## 2021-10-13 DIAGNOSIS — Y92019 Unspecified place in single-family (private) house as the place of occurrence of the external cause: Secondary | ICD-10-CM | POA: Diagnosis not present

## 2021-10-13 DIAGNOSIS — J690 Pneumonitis due to inhalation of food and vomit: Secondary | ICD-10-CM | POA: Diagnosis not present

## 2021-10-13 DIAGNOSIS — R296 Repeated falls: Secondary | ICD-10-CM | POA: Diagnosis not present

## 2021-10-13 DIAGNOSIS — R1312 Dysphagia, oropharyngeal phase: Secondary | ICD-10-CM | POA: Diagnosis not present

## 2021-10-13 DIAGNOSIS — F10939 Alcohol use, unspecified with withdrawal, unspecified: Secondary | ICD-10-CM

## 2021-10-13 DIAGNOSIS — J9602 Acute respiratory failure with hypercapnia: Secondary | ICD-10-CM | POA: Diagnosis not present

## 2021-10-13 DIAGNOSIS — Z515 Encounter for palliative care: Secondary | ICD-10-CM | POA: Diagnosis not present

## 2021-10-13 DIAGNOSIS — M6281 Muscle weakness (generalized): Secondary | ICD-10-CM | POA: Diagnosis not present

## 2021-10-13 DIAGNOSIS — Z8349 Family history of other endocrine, nutritional and metabolic diseases: Secondary | ICD-10-CM

## 2021-10-13 DIAGNOSIS — G8911 Acute pain due to trauma: Secondary | ICD-10-CM | POA: Diagnosis not present

## 2021-10-13 DIAGNOSIS — S32010A Wedge compression fracture of first lumbar vertebra, initial encounter for closed fracture: Secondary | ICD-10-CM | POA: Diagnosis not present

## 2021-10-13 DIAGNOSIS — Z20822 Contact with and (suspected) exposure to covid-19: Secondary | ICD-10-CM | POA: Diagnosis present

## 2021-10-13 DIAGNOSIS — R079 Chest pain, unspecified: Secondary | ICD-10-CM | POA: Diagnosis not present

## 2021-10-13 DIAGNOSIS — R1310 Dysphagia, unspecified: Secondary | ICD-10-CM | POA: Diagnosis not present

## 2021-10-13 DIAGNOSIS — F1010 Alcohol abuse, uncomplicated: Secondary | ICD-10-CM

## 2021-10-13 DIAGNOSIS — Z79899 Other long term (current) drug therapy: Secondary | ICD-10-CM

## 2021-10-13 DIAGNOSIS — S32018A Other fracture of first lumbar vertebra, initial encounter for closed fracture: Secondary | ICD-10-CM | POA: Diagnosis not present

## 2021-10-13 DIAGNOSIS — N400 Enlarged prostate without lower urinary tract symptoms: Secondary | ICD-10-CM | POA: Diagnosis present

## 2021-10-13 DIAGNOSIS — S2243XA Multiple fractures of ribs, bilateral, initial encounter for closed fracture: Secondary | ICD-10-CM | POA: Diagnosis not present

## 2021-10-13 DIAGNOSIS — S32009A Unspecified fracture of unspecified lumbar vertebra, initial encounter for closed fracture: Secondary | ICD-10-CM | POA: Diagnosis present

## 2021-10-13 DIAGNOSIS — S2249XA Multiple fractures of ribs, unspecified side, initial encounter for closed fracture: Secondary | ICD-10-CM

## 2021-10-13 DIAGNOSIS — I959 Hypotension, unspecified: Secondary | ICD-10-CM | POA: Diagnosis not present

## 2021-10-13 DIAGNOSIS — J90 Pleural effusion, not elsewhere classified: Secondary | ICD-10-CM | POA: Diagnosis not present

## 2021-10-13 DIAGNOSIS — J449 Chronic obstructive pulmonary disease, unspecified: Secondary | ICD-10-CM | POA: Diagnosis not present

## 2021-10-13 DIAGNOSIS — R0602 Shortness of breath: Secondary | ICD-10-CM | POA: Diagnosis not present

## 2021-10-13 DIAGNOSIS — G9341 Metabolic encephalopathy: Secondary | ICD-10-CM | POA: Diagnosis not present

## 2021-10-13 DIAGNOSIS — K6389 Other specified diseases of intestine: Secondary | ICD-10-CM | POA: Diagnosis not present

## 2021-10-13 DIAGNOSIS — Z5919 Other inadequate housing: Secondary | ICD-10-CM

## 2021-10-13 DIAGNOSIS — M5416 Radiculopathy, lumbar region: Secondary | ICD-10-CM | POA: Diagnosis not present

## 2021-10-13 DIAGNOSIS — R531 Weakness: Secondary | ICD-10-CM

## 2021-10-13 DIAGNOSIS — E871 Hypo-osmolality and hyponatremia: Secondary | ICD-10-CM | POA: Diagnosis not present

## 2021-10-13 DIAGNOSIS — Z781 Physical restraint status: Secondary | ICD-10-CM

## 2021-10-13 DIAGNOSIS — F10231 Alcohol dependence with withdrawal delirium: Secondary | ICD-10-CM | POA: Diagnosis not present

## 2021-10-13 DIAGNOSIS — T17908A Unspecified foreign body in respiratory tract, part unspecified causing other injury, initial encounter: Secondary | ICD-10-CM | POA: Diagnosis not present

## 2021-10-13 DIAGNOSIS — M545 Low back pain, unspecified: Secondary | ICD-10-CM | POA: Diagnosis not present

## 2021-10-13 DIAGNOSIS — Y904 Blood alcohol level of 80-99 mg/100 ml: Secondary | ICD-10-CM | POA: Diagnosis present

## 2021-10-13 DIAGNOSIS — M1990 Unspecified osteoarthritis, unspecified site: Secondary | ICD-10-CM | POA: Diagnosis present

## 2021-10-13 DIAGNOSIS — R54 Age-related physical debility: Secondary | ICD-10-CM | POA: Diagnosis present

## 2021-10-13 DIAGNOSIS — E860 Dehydration: Secondary | ICD-10-CM | POA: Diagnosis present

## 2021-10-13 DIAGNOSIS — Z7401 Bed confinement status: Secondary | ICD-10-CM | POA: Diagnosis not present

## 2021-10-13 DIAGNOSIS — D696 Thrombocytopenia, unspecified: Secondary | ICD-10-CM | POA: Diagnosis present

## 2021-10-13 DIAGNOSIS — R9431 Abnormal electrocardiogram [ECG] [EKG]: Secondary | ICD-10-CM | POA: Diagnosis not present

## 2021-10-13 DIAGNOSIS — S2241XA Multiple fractures of ribs, right side, initial encounter for closed fracture: Secondary | ICD-10-CM | POA: Diagnosis not present

## 2021-10-13 DIAGNOSIS — R400 Somnolence: Secondary | ICD-10-CM | POA: Diagnosis not present

## 2021-10-13 DIAGNOSIS — J60 Coalworker's pneumoconiosis: Secondary | ICD-10-CM | POA: Diagnosis present

## 2021-10-13 DIAGNOSIS — R627 Adult failure to thrive: Secondary | ICD-10-CM

## 2021-10-13 DIAGNOSIS — J9690 Respiratory failure, unspecified, unspecified whether with hypoxia or hypercapnia: Secondary | ICD-10-CM | POA: Diagnosis not present

## 2021-10-13 DIAGNOSIS — R0600 Dyspnea, unspecified: Secondary | ICD-10-CM | POA: Diagnosis not present

## 2021-10-13 DIAGNOSIS — K760 Fatty (change of) liver, not elsewhere classified: Secondary | ICD-10-CM | POA: Diagnosis present

## 2021-10-13 DIAGNOSIS — Z7189 Other specified counseling: Secondary | ICD-10-CM | POA: Diagnosis not present

## 2021-10-13 DIAGNOSIS — S2242XD Multiple fractures of ribs, left side, subsequent encounter for fracture with routine healing: Secondary | ICD-10-CM | POA: Diagnosis not present

## 2021-10-13 DIAGNOSIS — F10221 Alcohol dependence with intoxication delirium: Secondary | ICD-10-CM | POA: Diagnosis present

## 2021-10-13 DIAGNOSIS — R2689 Other abnormalities of gait and mobility: Secondary | ICD-10-CM | POA: Diagnosis not present

## 2021-10-13 DIAGNOSIS — S32010D Wedge compression fracture of first lumbar vertebra, subsequent encounter for fracture with routine healing: Secondary | ICD-10-CM | POA: Diagnosis not present

## 2021-10-13 DIAGNOSIS — K5730 Diverticulosis of large intestine without perforation or abscess without bleeding: Secondary | ICD-10-CM | POA: Diagnosis not present

## 2021-10-13 DIAGNOSIS — D649 Anemia, unspecified: Secondary | ICD-10-CM | POA: Diagnosis not present

## 2021-10-13 DIAGNOSIS — W57XXXD Bitten or stung by nonvenomous insect and other nonvenomous arthropods, subsequent encounter: Secondary | ICD-10-CM | POA: Diagnosis not present

## 2021-10-13 DIAGNOSIS — S2242XA Multiple fractures of ribs, left side, initial encounter for closed fracture: Secondary | ICD-10-CM | POA: Diagnosis not present

## 2021-10-13 DIAGNOSIS — N401 Enlarged prostate with lower urinary tract symptoms: Secondary | ICD-10-CM | POA: Diagnosis present

## 2021-10-13 DIAGNOSIS — Z4682 Encounter for fitting and adjustment of non-vascular catheter: Secondary | ICD-10-CM | POA: Diagnosis not present

## 2021-10-13 DIAGNOSIS — D539 Nutritional anemia, unspecified: Secondary | ICD-10-CM | POA: Diagnosis not present

## 2021-10-13 DIAGNOSIS — J439 Emphysema, unspecified: Secondary | ICD-10-CM | POA: Diagnosis not present

## 2021-10-13 DIAGNOSIS — S0990XA Unspecified injury of head, initial encounter: Secondary | ICD-10-CM | POA: Diagnosis not present

## 2021-10-13 DIAGNOSIS — S32028D Other fracture of second lumbar vertebra, subsequent encounter for fracture with routine healing: Secondary | ICD-10-CM | POA: Diagnosis not present

## 2021-10-13 DIAGNOSIS — D7389 Other diseases of spleen: Secondary | ICD-10-CM | POA: Diagnosis not present

## 2021-10-13 DIAGNOSIS — S2249XD Multiple fractures of ribs, unspecified side, subsequent encounter for fracture with routine healing: Secondary | ICD-10-CM | POA: Diagnosis not present

## 2021-10-13 HISTORY — DX: Multiple fractures of ribs, unspecified side, initial encounter for closed fracture: S22.49XA

## 2021-10-13 LAB — CBC WITH DIFFERENTIAL/PLATELET
Abs Immature Granulocytes: 0.02 10*3/uL (ref 0.00–0.07)
Basophils Absolute: 0 10*3/uL (ref 0.0–0.1)
Basophils Relative: 0 %
Eosinophils Absolute: 0.1 10*3/uL (ref 0.0–0.5)
Eosinophils Relative: 1 %
HCT: 32.7 % — ABNORMAL LOW (ref 39.0–52.0)
Hemoglobin: 11.4 g/dL — ABNORMAL LOW (ref 13.0–17.0)
Immature Granulocytes: 0 %
Lymphocytes Relative: 33 %
Lymphs Abs: 1.8 10*3/uL (ref 0.7–4.0)
MCH: 35.1 pg — ABNORMAL HIGH (ref 26.0–34.0)
MCHC: 34.9 g/dL (ref 30.0–36.0)
MCV: 100.6 fL — ABNORMAL HIGH (ref 80.0–100.0)
Monocytes Absolute: 0.6 10*3/uL (ref 0.1–1.0)
Monocytes Relative: 10 %
Neutro Abs: 3 10*3/uL (ref 1.7–7.7)
Neutrophils Relative %: 56 %
Platelets: 148 10*3/uL — ABNORMAL LOW (ref 150–400)
RBC: 3.25 MIL/uL — ABNORMAL LOW (ref 4.22–5.81)
RDW: 13.6 % (ref 11.5–15.5)
WBC: 5.5 10*3/uL (ref 4.0–10.5)
nRBC: 0.4 % — ABNORMAL HIGH (ref 0.0–0.2)

## 2021-10-13 LAB — URINALYSIS, ROUTINE W REFLEX MICROSCOPIC
Bacteria, UA: NONE SEEN
Bilirubin Urine: NEGATIVE
Glucose, UA: NEGATIVE mg/dL
Ketones, ur: 20 mg/dL — AB
Leukocytes,Ua: NEGATIVE
Nitrite: NEGATIVE
Protein, ur: NEGATIVE mg/dL
Specific Gravity, Urine: 1.015 (ref 1.005–1.030)
pH: 5 (ref 5.0–8.0)

## 2021-10-13 LAB — COMPREHENSIVE METABOLIC PANEL
ALT: 37 U/L (ref 0–44)
AST: 54 U/L — ABNORMAL HIGH (ref 15–41)
Albumin: 3.4 g/dL — ABNORMAL LOW (ref 3.5–5.0)
Alkaline Phosphatase: 67 U/L (ref 38–126)
Anion gap: 13 (ref 5–15)
BUN: 15 mg/dL (ref 8–23)
CO2: 23 mmol/L (ref 22–32)
Calcium: 8.7 mg/dL — ABNORMAL LOW (ref 8.9–10.3)
Chloride: 97 mmol/L — ABNORMAL LOW (ref 98–111)
Creatinine, Ser: 0.67 mg/dL (ref 0.61–1.24)
GFR, Estimated: >60 mL/min (ref >60)
Glucose, Bld: 87 mg/dL (ref 70–99)
Potassium: 3.6 mmol/L (ref 3.5–5.1)
Sodium: 133 mmol/L — ABNORMAL LOW (ref 135–145)
Total Bilirubin: 1 mg/dL (ref 0.3–1.2)
Total Protein: 6.1 g/dL — ABNORMAL LOW (ref 6.5–8.1)

## 2021-10-13 LAB — PROTIME-INR
INR: 1 (ref 0.8–1.2)
Prothrombin Time: 12.7 seconds (ref 11.4–15.2)

## 2021-10-13 LAB — TROPONIN I (HIGH SENSITIVITY)
Troponin I (High Sensitivity): 27 ng/L — ABNORMAL HIGH (ref <18)
Troponin I (High Sensitivity): 26 ng/L — ABNORMAL HIGH (ref <18)

## 2021-10-13 LAB — RAPID URINE DRUG SCREEN, HOSP PERFORMED
Amphetamines: NOT DETECTED
Barbiturates: NOT DETECTED
Benzodiazepines: NOT DETECTED
Cocaine: NOT DETECTED
Opiates: NOT DETECTED
Tetrahydrocannabinol: NOT DETECTED

## 2021-10-13 LAB — SARS CORONAVIRUS 2 BY RT PCR: SARS Coronavirus 2 by RT PCR: NEGATIVE

## 2021-10-13 LAB — VITAMIN D 25 HYDROXY (VIT D DEFICIENCY, FRACTURES): Vit D, 25-Hydroxy: 50.11 ng/mL (ref 30–100)

## 2021-10-13 LAB — FOLATE: Folate: 3.8 ng/mL — ABNORMAL LOW (ref >5.9)

## 2021-10-13 LAB — VITAMIN B12: Vitamin B-12: 973 pg/mL — ABNORMAL HIGH (ref 180–914)

## 2021-10-13 LAB — ETHANOL: Alcohol, Ethyl (B): 85 mg/dL — ABNORMAL HIGH (ref <10)

## 2021-10-13 LAB — CK: Total CK: 74 U/L (ref 49–397)

## 2021-10-13 LAB — LIPASE, BLOOD: Lipase: 31 U/L (ref 11–51)

## 2021-10-13 MED ORDER — TRAZODONE HCL 50 MG PO TABS
25.0000 mg | ORAL_TABLET | Freq: Every day | ORAL | Status: DC
  Administered 2021-10-13 – 2021-10-17 (×5): 25 mg via ORAL
  Filled 2021-10-13 (×5): qty 1

## 2021-10-13 MED ORDER — SODIUM CHLORIDE 0.9 % IV SOLN
INTRAVENOUS | Status: AC
Start: 2021-10-13 — End: 2021-10-14

## 2021-10-13 MED ORDER — ADULT MULTIVITAMIN W/MINERALS CH
1.0000 | ORAL_TABLET | Freq: Every day | ORAL | Status: DC
  Administered 2021-10-13 – 2021-10-17 (×5): 1 via ORAL
  Filled 2021-10-13 (×7): qty 1

## 2021-10-13 MED ORDER — THIAMINE HCL 100 MG PO TABS
100.0000 mg | ORAL_TABLET | Freq: Every day | ORAL | Status: DC
  Administered 2021-10-13 – 2021-10-17 (×5): 100 mg via ORAL
  Filled 2021-10-13 (×7): qty 1

## 2021-10-13 MED ORDER — VITAMIN B-12 1000 MCG PO TABS
1000.0000 ug | ORAL_TABLET | Freq: Every day | ORAL | Status: DC
  Administered 2021-10-13 – 2021-10-17 (×5): 1000 ug via ORAL
  Filled 2021-10-13 (×7): qty 1

## 2021-10-13 MED ORDER — LORAZEPAM 1 MG PO TABS: 1.0000 mg | ORAL_TABLET | ORAL | Status: AC | PRN

## 2021-10-13 MED ORDER — OXYCODONE HCL 5 MG PO TABS
5.0000 mg | ORAL_TABLET | ORAL | Status: DC | PRN
  Administered 2021-10-14 – 2021-10-15 (×2): 5 mg via ORAL
  Filled 2021-10-13 (×2): qty 1

## 2021-10-13 MED ORDER — DOCUSATE SODIUM 100 MG PO CAPS
100.0000 mg | ORAL_CAPSULE | Freq: Two times a day (BID) | ORAL | Status: DC
  Administered 2021-10-13 – 2021-10-17 (×10): 100 mg via ORAL
  Filled 2021-10-13 (×12): qty 1

## 2021-10-13 MED ORDER — FOLIC ACID 1 MG PO TABS
1.0000 mg | ORAL_TABLET | Freq: Every day | ORAL | Status: DC
  Administered 2021-10-13 – 2021-10-17 (×5): 1 mg via ORAL
  Filled 2021-10-13 (×7): qty 1

## 2021-10-13 MED ORDER — IBUPROFEN 400 MG PO TABS
400.0000 mg | ORAL_TABLET | Freq: Four times a day (QID) | ORAL | Status: DC | PRN
  Administered 2021-10-29: 400 mg via ORAL
  Filled 2021-10-13 (×2): qty 1

## 2021-10-13 MED ORDER — LISINOPRIL 10 MG PO TABS
10.0000 mg | ORAL_TABLET | Freq: Every day | ORAL | Status: DC
  Administered 2021-10-13 – 2021-10-16 (×4): 10 mg via ORAL
  Filled 2021-10-13 (×4): qty 1

## 2021-10-13 MED ORDER — THIAMINE HCL 100 MG/ML IJ SOLN
100.0000 mg | Freq: Every day | INTRAMUSCULAR | Status: DC
  Administered 2021-10-19: 100 mg via INTRAVENOUS
  Filled 2021-10-13: qty 2

## 2021-10-13 MED ORDER — ENSURE ENLIVE PO LIQD
237.0000 mL | Freq: Two times a day (BID) | ORAL | Status: DC
  Administered 2021-10-13 – 2021-10-17 (×9): 237 mL via ORAL

## 2021-10-13 MED ORDER — CHLORDIAZEPOXIDE HCL 25 MG PO CAPS
25.0000 mg | ORAL_CAPSULE | Freq: Three times a day (TID) | ORAL | Status: DC
  Administered 2021-10-13 – 2021-10-15 (×8): 25 mg via ORAL
  Filled 2021-10-13 (×8): qty 1

## 2021-10-13 MED ORDER — NICOTINE 21 MG/24HR TD PT24
21.0000 mg | MEDICATED_PATCH | Freq: Every day | TRANSDERMAL | Status: DC
  Administered 2021-10-13 – 2021-11-02 (×20): 21 mg via TRANSDERMAL
  Filled 2021-10-13 (×21): qty 1

## 2021-10-13 MED ORDER — FENTANYL CITRATE PF 50 MCG/ML IJ SOSY
25.0000 ug | PREFILLED_SYRINGE | INTRAMUSCULAR | Status: DC | PRN
  Administered 2021-10-13: 25 ug via INTRAVENOUS
  Filled 2021-10-13: qty 1

## 2021-10-13 MED ORDER — LORAZEPAM 2 MG/ML IJ SOLN: 1.0000 mg | INTRAMUSCULAR | Status: AC | PRN

## 2021-10-13 MED ORDER — ONDANSETRON HCL 4 MG PO TABS: 4.0000 mg | ORAL_TABLET | Freq: Four times a day (QID) | ORAL | Status: DC | PRN

## 2021-10-13 MED ORDER — ONDANSETRON HCL 4 MG/2ML IJ SOLN: 4.0000 mg | Freq: Four times a day (QID) | INTRAMUSCULAR | Status: DC | PRN

## 2021-10-13 MED ORDER — VITAMIN D 25 MCG (1000 UNIT) PO TABS
1000.0000 [IU] | ORAL_TABLET | Freq: Every day | ORAL | Status: DC
  Administered 2021-10-13 – 2021-10-17 (×5): 1000 [IU] via ORAL
  Filled 2021-10-13 (×7): qty 1

## 2021-10-13 MED ORDER — ENOXAPARIN SODIUM 40 MG/0.4ML IJ SOSY
40.0000 mg | PREFILLED_SYRINGE | INTRAMUSCULAR | Status: DC
  Administered 2021-10-13 – 2021-11-01 (×20): 40 mg via SUBCUTANEOUS
  Filled 2021-10-13 (×20): qty 0.4

## 2021-10-13 MED ORDER — SODIUM CHLORIDE 0.9 % IV BOLUS
500.0000 mL | Freq: Once | INTRAVENOUS | Status: AC
  Administered 2021-10-13: 500 mL via INTRAVENOUS

## 2021-10-13 MED ORDER — OXYCODONE-ACETAMINOPHEN 5-325 MG PO TABS
1.0000 | ORAL_TABLET | Freq: Once | ORAL | Status: AC
  Administered 2021-10-13: 1 via ORAL
  Filled 2021-10-13: qty 1

## 2021-10-13 NOTE — Assessment & Plan Note (Signed)
-   Multifactorial given chronic alcohol abuse, multiple rib fractures and lumbar transverse process fracture - See work-up noted above, treating supportively and working on safe for home placement

## 2021-10-13 NOTE — Assessment & Plan Note (Addendum)
-   Continue the use of clonidine and flows on max -Follow heart healthy/low-sodium diet.

## 2021-10-13 NOTE — ED Triage Notes (Signed)
Pt arrives [facility] from home c/o generalized weakness and multiple falls for the past several months. Pt states most of his pain is mid to lower back pain.   Pt admits to drinking 1/2 a fifth of whisky tonight.

## 2021-10-13 NOTE — Assessment & Plan Note (Addendum)
-    PT recommending SNF placement -- He needs to get out of bed and ambulate more with his TLSO brace.  Ambulate with assistance orders placed.

## 2021-10-13 NOTE — ED Provider Notes (Signed)
[facility] EMERGENCY DEPARTMENT Provider Note   CSN: [id number] Arrival date & time: 10/13/21  4098     History  Chief Complaint  Patient presents with   Generalized Weakness    Larry [name] is a 71 y.o. male.  He is brought in from home by ambulance after generalized weakness and multiple falls.  He said he is fallen multiple times due to dizziness.  He describes this both as room spinning and lightheadedness.  He said he injured his left back during the last fall yesterday.  Endorses cough nonproductive along with chest congestion.  Complaining of pain all over.  Also states he drinks half 1/5 of whiskey a day.  Has not seen a doctor in over 2 years.  Has a history of hypertension.  Also smokes.  Lives alone  The history is provided by the patient.  Weakness Severity:  Severe Onset quality:  Gradual Duration: Few months. Progression:  Worsening Chronicity:  New Context: alcohol use   Relieved by:  Nothing Worsened by:  Activity Ineffective treatments:  Rest Associated symptoms: arthralgias, cough, difficulty walking, dizziness, falls and shortness of breath   Associated symptoms: no abdominal pain, no chest pain, no dysuria, no fever, no frequency, no headaches, no loss of consciousness, no nausea, no vision change and no vomiting        Home Medications Prior to Admission medications   Medication Sig Start Date End Date Taking? Authorizing Provider  acetaminophen (TYLENOL) 325 MG tablet Take 2 tablets (650 mg total) by mouth every 6 (six) hours as needed for mild pain or headache (or Fever >/= 101). 02/14/21   Vassie Loll, MD  cholecalciferol (VITAMIN D3) 25 MCG (1000 UNIT) tablet Take 1,000 Units by mouth daily.    [provider]  cyanocobalamin 1000 MCG tablet Take 1 tablet (1,000 mcg total) by mouth daily. 03/15/21   Shon Hale, MD  folic acid (FOLVITE) 1 MG tablet Take 1 tablet (1 mg total) by mouth daily. 03/16/21   Shon Hale, MD   metoprolol tartrate (LOPRESSOR) 25 MG tablet Take 0.5 tablets (12.5 mg total) by mouth 2 (two) times daily. 03/15/21   Shon Hale, MD  Multiple Vitamin (MULTIVITAMIN WITH MINERALS) TABS tablet Take 1 tablet by mouth daily. 03/16/21   Shon Hale, MD  senna-docusate (SENOKOT-S) 8.6-50 MG tablet Take 2 tablets by mouth at bedtime. 03/15/21   Shon Hale, MD  thiamine 100 MG tablet Take 1 tablet (100 mg total) by mouth daily. 03/15/21   Shon Hale, MD  traZODone (DESYREL) 50 MG tablet Take 0.5 tablets (25 mg total) by mouth at bedtime. 03/15/21   Shon Hale, MD      Allergies    Patient has no known allergies.    Review of Systems   Review of Systems  Constitutional:  Negative for fever.  HENT:  Negative for sore throat.   Eyes:  Negative for visual disturbance.  Respiratory:  Positive for cough and shortness of breath.   Cardiovascular:  Negative for chest pain.  Gastrointestinal:  Positive for constipation. Negative for abdominal pain, nausea and vomiting.  Genitourinary:  Negative for dysuria and frequency.  Musculoskeletal:  Positive for arthralgias, back pain and falls.  Neurological:  Positive for dizziness, weakness and light-headedness. Negative for loss of consciousness and headaches.    Physical Exam Updated Vital Signs BP 123/73   Pulse 94   Temp 97.6 F (36.4 C) (Oral)   Resp 18   Ht  (1.88 m)  Wt 61.2 kg   SpO2 92%   BMI 17.32 kg/m  Physical Exam Vitals and nursing note reviewed.  Constitutional:      General: He is not in acute distress.    Appearance: Normal appearance. He is well-developed and underweight.  HENT:     Head: Normocephalic and atraumatic.  Eyes:     Conjunctiva/sclera: Conjunctivae normal.  Cardiovascular:     Rate and Rhythm: Normal rate and regular rhythm.     Heart sounds: No murmur heard. Pulmonary:     Effort: Pulmonary effort is normal. No respiratory distress.     Breath sounds: Normal breath  sounds.  Abdominal:     Palpations: Abdomen is soft.     Tenderness: There is no abdominal tenderness. There is no guarding or rebound.  Musculoskeletal:        General: No deformity. Normal range of motion.     Cervical back: Neck supple.     Right lower leg: No edema.     Left lower leg: No edema.  Skin:    General: Skin is warm and dry.     Capillary Refill: Capillary refill takes less than 2 seconds.  Neurological:     General: No focal deficit present.     Mental Status: He is alert.  Psychiatric:        Behavior: Behavior is cooperative.     ED Results / Procedures / Treatments   Labs (all labs ordered are listed, but only abnormal results are displayed) Labs Reviewed  ETHANOL - Abnormal; Notable for the following components:      Result Value   Alcohol, Ethyl (B) 85 (*)    All other components within normal limits  COMPREHENSIVE METABOLIC PANEL - Abnormal; Notable for the following components:   Sodium 133 (*)    Chloride 97 (*)    Calcium 8.7 (*)    Total Protein 6.1 (*)    Albumin 3.4 (*)    AST 54 (*)    All other components within normal limits  CBC WITH DIFFERENTIAL/PLATELET - Abnormal; Notable for the following components:   RBC 3.25 (*)    Hemoglobin 11.4 (*)    HCT 32.7 (*)    MCV 100.6 (*)    MCH 35.1 (*)    Platelets 148 (*)    nRBC 0.4 (*)    All other components within normal limits  URINALYSIS, ROUTINE W REFLEX MICROSCOPIC - Abnormal; Notable for the following components:   Hgb urine dipstick LARGE (*)    Ketones, ur 20 (*)    All other components within normal limits  VITAMIN B12 - Abnormal; Notable for the following components:   Vitamin B-12 973 (*)    All other components within normal limits  FOLATE - Abnormal; Notable for the following components:   Folate 3.8 (*)    All other components within normal limits  TROPONIN I (HIGH SENSITIVITY) - Abnormal; Notable for the following components:   Troponin I (High Sensitivity) 27 (*)    All  other components within normal limits  TROPONIN I (HIGH SENSITIVITY) - Abnormal; Notable for the following components:   Troponin I (High Sensitivity) 26 (*)    All other components within normal limits  SARS CORONAVIRUS 2 BY RT PCR  RAPID URINE DRUG SCREEN, HOSP PERFORMED  PROTIME-INR  CK  LIPASE, BLOOD  VITAMIN D 25 HYDROXY (VIT D DEFICIENCY, FRACTURES)  COMPREHENSIVE METABOLIC PANEL  MAGNESIUM    EKG EKG Interpretation  Date/Time:  Thursday October 13 2021 07:37:54 EDT Ventricular Rate:  91 PR Interval:  166 QRS Duration: 135 QT Interval:  391 QTC Calculation: 482 R Axis:   -79 Text Interpretation: Sinus rhythm RBBB and LAFB Nonspecific T abnormalities, lateral leads ST elevation, consider inferior injury Baseline wander in lead(s) III No significant change since prior 11/22 Confirmed by Meridee Score [phone]) on 10/13/2021 7:48:07 AM  Radiology CT L-SPINE NO CHARGE  Result Date: 10/13/2021 CLINICAL DATA:  Lumbar radiculopathy, trauma; generalized weakness and multiple falls EXAM: CT Lumbar Spine without contrast TECHNIQUE: Technique: Multiplanar CT images of the lumbar spine were reconstructed from contemporary CT of the Abdomen and Pelvis. RADIATION DOSE REDUCTION: This exam was performed according to the departmental dose-optimization program which includes automated exposure control, adjustment of the mA and/or kV according to patient size and/or use of iterative reconstruction technique. CONTRAST:  None COMPARISON:  None Available. FINDINGS: Segmentation: 5 lumbar type vertebrae assuming rudimentary ribs at T12. Alignment: No significant listhesis. Vertebrae: Decreased osseous mineralization. Chronic appearing compression fracture of L1 with loss of height at superior and inferior endplates resulting in moderate loss of height. Otherwise multilevel degenerative plate irregularity and minor loss of vertebral body height. Acute nondisplaced fracture of the left L2 transverse process.  Paraspinal and other soft tissues: Acute fracture of the posterior left eleventh rib. Extra-spinal findings are better evaluated on concurrent dedicated source imaging. Disc levels: Multilevel degenerative changes with disc space narrowing, disc bulges, endplate osteophytes, facet hypertrophy, and ligamentum flavum thickening. Canal and subarticular recess narrowing is greatest at L4-L5. Foraminal narrowing is greatest on the left at L4-L5. IMPRESSION: Nondisplaced acute fracture of the left L2 transverse process. Acute fracture of the posterior left eleventh rib. Chronic L1 compression fracture. Electronically Signed   By: Guadlupe Spanish M.D.   On: 10/13/2021 10:50   CT Renal Stone Study  Result Date: 10/13/2021 CLINICAL DATA:  Flank pain, suspected kidney stone in a 71 year old male. EXAM: CT ABDOMEN AND PELVIS WITHOUT CONTRAST TECHNIQUE: Multidetector CT imaging of the abdomen and pelvis was performed following the standard protocol without IV contrast. RADIATION DOSE REDUCTION: This exam was performed according to the departmental dose-optimization program which includes automated exposure control, adjustment of the mA and/or kV according to patient size and/or use of iterative reconstruction technique. COMPARISON:  None available FINDINGS: Lower chest: Granulomatous changes throughout the chest. Basilar atelectasis. Trace LEFT-sided effusion. Marked pulmonary emphysema. Heart with normal size. Three-vessel coronary artery calcification. No pericardial effusion or nodularity. Heart is incompletely imaged. Hepatobiliary: Severe hepatic steatosis. No visible, focal suspicious hepatic lesion. No pericholecystic stranding. No gross biliary duct distension. Pancreas: Normal contour without signs of inflammation. Spleen: Low-density lesion in the spleen is unchanged dating back to May of 2022 at 1.7 cm, imaged on the chest CT that was performed in 2022. This displays near water density and is likely a benign cyst  or splenic hemangioma. Adrenals/Urinary Tract: Adrenal glands are normal. Smooth renal contours. No hydronephrosis. No nephrolithiasis. No perivesical stranding. Stomach/Bowel: Small hiatal hernia. Mild gaseous distension of small bowel without substantial dilation or adjacent stranding. Appendix not visible, no secondary signs to suggest acute appendicitis. Colonic diverticulosis without current evidence of diverticulitis. Diverticular changes are worse in the sigmoid colon. Vascular/Lymphatic: Aortic atherosclerosis. No sign of aneurysm. Smooth contour of the IVC. There is no gastrohepatic or hepatoduodenal ligament lymphadenopathy. No retroperitoneal or mesenteric lymphadenopathy. No pelvic sidewall lymphadenopathy. Marked atherosclerotic changes of the abdominal aorta. Assessment limited by lack of intravenous contrast. Reproductive: Unremarkable by CT. Other: No  pneumoperitoneum or ascites Musculoskeletal: Osteopenia. And spinal degenerative changes. Loss of height in the L1 level approximately 40-50% loss of height along the inferior endplate more anteriorly is unchanged based on comparison with 2019 imaging. Acute rib fractures of LEFT-sided posterior ribs 11 and 12 with displacement. Also LEFT tenth rib with mildly displaced fracture these are seen posterolateral E and may explain the small LEFT-sided effusion. This effusion does show low-density. Subacute fracture of RIGHT twelfth rib and healed fractures of RIGHT posterolateral ribs and LEFT lateral ribs. IMPRESSION: 1. Completely displaced fractures of posterior LEFT eleventh and twelfth ribs with mildly displaced fracture of LEFT tenth rib, associated with small LEFT-sided effusion. 2. Subacute fractures of RIGHT twelfth rib and other chronic appearing healed rib fractures compatible with multiple rib fractures of varying ages. Correlate with any history of recent and or remote trauma. 3. No nephrolithiasis or hydronephrosis. 4. Severe hepatic steatosis.  5. Colonic diverticulosis without current evidence of diverticulitis. 6. Small hiatal hernia. 7. Three-vessel coronary artery calcification. 8. Marked atherosclerotic changes of the abdominal aorta. Aortic Atherosclerosis (ICD10-I70.0) and Emphysema (ICD10-J43.9). Electronically Signed   By: Donzetta Kohut M.D.   On: 10/13/2021 10:33   CT Head Wo Contrast  Result Date: 10/13/2021 CLINICAL DATA:  Head trauma EXAM: CT HEAD WITHOUT CONTRAST TECHNIQUE: Contiguous axial images were obtained from the base of the skull through the vertex without intravenous contrast. RADIATION DOSE REDUCTION: This exam was performed according to the departmental dose-optimization program which includes automated exposure control, adjustment of the mA and/or kV according to patient size and/or use of iterative reconstruction technique. COMPARISON:  Head CT dated Aug 30, 2014 FINDINGS: Brain: Chronic white matter ischemic change. No evidence of acute infarction, hemorrhage, hydrocephalus, extra-axial collection or mass lesion/mass effect. Vascular: No hyperdense vessel or unexpected calcification. Skull: Normal. Negative for fracture or focal lesion. Sinuses/Orbits: No acute finding. Other: None. IMPRESSION: No acute intracranial abnormality. Electronically Signed   By: Allegra Lai M.D.   On: 10/13/2021 10:21   DG Lumbar Spine Complete  Result Date: 10/13/2021 CLINICAL DATA:  back pain fall EXAM: LUMBAR SPINE - COMPLETE 4+ VIEW COMPARISON:  CT 08/30/2020 FINDINGS: Unchanged chronic compression deformity of L1 and mild anterior wedging of T12. There is no evidence of fracture involving L2 through L5. There is multilevel degenerative disc disease, severe at L4-L5 with trace, grade 1 anterolisthesis at this level. There is mild to moderate lower lumbar predominant facet arthropathy. IMPRESSION: Unchanged chronic compression deformity of L1 and mild anterior wedging of T12. No evidence of acute lumbar spine fracture. Multilevel  degenerative disc disease, severe at L4-L5. Mild to moderate lower lumbar predominant facet arthropathy. Electronically Signed   By: Caprice Renshaw M.D.   On: 10/13/2021 08:39   DG Chest Port 1 View  Result Date: 10/13/2021 CLINICAL DATA:  Fall.  Cough.  Back pain. EXAM: PORTABLE CHEST 1 VIEW COMPARISON:  Two-view chest x-ray 08/29/2020 FINDINGS: Heart size normal. Atherosclerotic calcifications are present at the aortic arch. Extensive granulomatous disease is again noted. No superimposed airspace disease is present. No edema or effusion is present. Remote right-sided rib fractures are noted. Postoperative changes again noted in the right humerus. Chronic inferior dislocation of the right shoulder noted. IMPRESSION: 1. No acute cardiopulmonary disease or significant interval change. 2. Extensive granulomatous disease. 3. Remote right-sided rib fractures. 4. Chronic right shoulder dislocation Electronically Signed   By: Marin Roberts M.D.   On: 10/13/2021 08:19    Procedures Procedures    Medications Ordered in  ED Medications  lisinopril (ZESTRIL) tablet 10 mg (10 mg Oral Given 10/13/21 1516)  traZODone (DESYREL) tablet 25 mg (has no administration in time range)  vitamin B-12 (CYANOCOBALAMIN) tablet 1,000 mcg (1,000 mcg Oral Given 10/13/21 1516)  cholecalciferol (VITAMIN D3) tablet 1,000 Units (1,000 Units Oral Given 10/13/21 1516)  LORazepam (ATIVAN) tablet 1-4 mg (has no administration in time range)    Or  LORazepam (ATIVAN) injection 1-4 mg (has no administration in time range)  thiamine tablet 100 mg (100 mg Oral Given 10/13/21 1516)    Or  thiamine (B-1) injection 100 mg ( Intravenous See Alternative 10/13/21 1516)  folic acid (FOLVITE) tablet 1 mg (1 mg Oral Given 10/13/21 1516)  multivitamin with minerals tablet 1 tablet (1 tablet Oral Given 10/13/21 1515)  chlordiazePOXIDE (LIBRIUM) capsule 25 mg (25 mg Oral Given 10/13/21 1515)  enoxaparin (LOVENOX) injection 40 mg (40 mg Subcutaneous  Given 10/13/21 1514)  0.9 %  sodium chloride infusion ( Intravenous New Bag/Given 10/13/21 1738)  ibuprofen (ADVIL) tablet 400 mg (has no administration in time range)  oxyCODONE (Oxy IR/ROXICODONE) immediate release tablet 5 mg (has no administration in time range)  fentaNYL (SUBLIMAZE) injection 25 mcg (25 mcg Intravenous Given 10/13/21 1513)  docusate sodium (COLACE) capsule 100 mg (100 mg Oral Given 10/13/21 1515)  ondansetron (ZOFRAN) tablet 4 mg (has no administration in time range)    Or  ondansetron (ZOFRAN) injection 4 mg (has no administration in time range)  nicotine (NICODERM CQ - dosed in mg/24 hours) patch 21 mg (21 mg Transdermal Patch Applied 10/13/21 1517)  feeding supplement (ENSURE ENLIVE / ENSURE PLUS) liquid 237 mL (237 mLs Oral Given 10/13/21 1516)  sodium chloride 0.9 % bolus 500 mL (0 mLs Intravenous Stopped 10/13/21 0847)  oxyCODONE-acetaminophen (PERCOCET/ROXICET) 5-325 MG per tablet 1 tablet (1 tablet Oral Given 10/13/21 1107)    ED Course/ Medical Decision Making/ A&P Clinical Course as of 10/13/21 1836  Thu Oct 13, 2021  0747 Patient had multiple bugs crawling on him.  Nurses helped with dressing patient in a hospital gown.  Bedbug precaution instituted. [MB]  [id number] Chest x-ray showing multiple nodules consistent with granulomatous disease.  No acute findings noted.  Awaiting radiology reading. [MB]  1056 CT showing multiple rib fractures small pleural effusion low density.  Also shows acute transverse process fracture and old compression fracture.  Reviewed with patient.  He is agreeable to plan for admission.  He said he cannot care for himself in his current condition. [MB]  1109 Discussed with Dr. Laural Benes [facility] hospitalist who will evaluate patient for admission. [MB]    Clinical Course User Index [MB] Terrilee Files, MD                           Medical Decision Making Amount and/or Complexity of Data Reviewed Labs: ordered. Radiology:  ordered.  Risk Prescription drug management. Decision regarding hospitalization.   This patient complains of falls back pain alcohol abuse; this involves an extensive number of treatment Options and is a complaint that carries with it a high risk of complications and morbidity. The differential includes dehydration, metabolic derangement, fracture, contusion, muscle weakness, malnutrition  I ordered, reviewed and interpreted labs, which included CBC with normal white count, hemoglobin low stable from priors, sodium low chemistries otherwise fairly unremarkable, mild elevation of AST COVID-negative, urinalysis some hematuria I ordered medication iv fluids, oral pain medication with improvement in his symptoms and reviewed PMP when indicated.  I ordered imaging studies which included chest x-ray and lumbar spine, CT head renal and lumbar and I independently    visualized and interpreted imaging which showed rib fractures, lumbar transverse process fracture Additional history obtained from EMS Previous records obtained and reviewed, no recent admissions I consulted [facility] hospitalist Dr. Laural Benes and discussed lab and imaging findings and discussed disposition.  Cardiac monitoring reviewed, sinus rhythm Social determinants considered, ongoing tobacco and alcohol abuse Critical Interventions: None  After the interventions stated above, I reevaluated the patient and found patient to be significantly debilitated Admission and further testing considered, he would benefit from admission for continued hydration and nutrition, physical therapy evaluation, social work for home situation evaluation         Final Clinical Impression(s) / ED Diagnoses Final diagnoses:  Frequent falls  Closed fracture of multiple ribs of both sides, initial encounter  Moderate malnutrition (HCC)  Alcohol abuse  Closed fracture of transverse process of lumbar vertebra, initial encounter [facility])    Rx / DC  Orders ED Discharge Orders     None         Terrilee Files, MD 10/13/21 1842

## 2021-10-13 NOTE — Assessment & Plan Note (Signed)
-   Secondary to chronic alcohol abuse and nutritional deficiencies - Follow-up vitamin B12 and folic acid levels - Resume daily oral vitamin B12 supplement, multivitamin and folic acid

## 2021-10-13 NOTE — Evaluation (Signed)
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  He admits to drinking one half of 1/5 of whiskey daily.  He is mostly complaining of pain to his mid and lower back.  His home was noted to be in terrible condition and he was covered and bedbugs and all of his clothing had to be removed and he had to be bathed in the ED.  He was sent for work-up in the ED including CT scans and x-rays and noted to have displaced rib fractures in the posterior left 11th and 12th ribs and mildly displaced left 10th rib fracture and severe hepatic steatosis and left L2 transverse process acute fracture.  He is notably in severe pain and noted to be hyponatremic as well.  Admission was requested for pain management and possible placement.  He has severe problems with frequent falling likely related to alcohol intoxication   Clinical Impression  Patient presents supine in bed and consents to PT evaluation. Patient medicated by nursing during session. Patient is min assist with bed mobility requiring verbal cueing for hand placement and performing log roll. Trunk stability applied by PT along with LE assist to sit upright. TSLO applied once patient sat EOB. Patient is min assist with transfers and use of RW with TSLO maintained for spine stability. Minor balance deficits observed and base of support impacted by forward trunk lean. Reliant on RW for stability and balance in standing upright. Patient ambulated 15 steps in room to bathroom with RW and min  assist. Minor gait deviations observed with fair coordination. Balance deficits demonstrated requiring min assist for fall prevention. Patient able to perform sit to supine with supervision and no increase in sx. Patient left supine in bed with nursing staff present. Patient will benefit from continued skilled physical therapy in hospital and recommended venue below to increase strength, balance, endurance for safe ADLs and gait.      Recommendations for follow up therapy are one component of a multi-disciplinary discharge planning process, led by the attending physician.  Recommendations may be updated based on patient status, additional functional criteria and insurance authorization.  Follow Up Recommendations Skilled nursing-short term rehab (<3 hours/day) Can patient physically be transported by private vehicle: Yes    Assistance Recommended at Discharge Set up Supervision/Assistance  Patient can return home with the following  A little help with walking and/or transfers;Help with stairs or ramp for entrance;Assist for transportation;Assistance with cooking/housework    Equipment Recommendations None recommended by PT  Recommendations for Other Services       Functional Status Assessment Patient has had a recent decline in their functional status and demonstrates the ability to make significant improvements in function in a reasonable and predictable amount of time.     Precautions / Restrictions Precautions Precautions: Fall Restrictions Weight Bearing Restrictions: No      Mobility  Bed Mobility Overal bed mobility: Needs Assistance Bed Mobility: Supine to Sit     Supine to sit: Min assist     General bed mobility comments: Min assist with supine to sit. Verbal cueing to perform log  roll while providing trunk stability and LE assist.    Transfers Overall transfer level: Needs assistance Equipment used: Rolling walker (2 wheels) Transfers: Sit to/from Stand Sit to  Stand: Min assist           General transfer comment: Min assist with transfers and use of RW. TSLO applied for stability. Minor balance deficits standing upright while reliant on RW.    Ambulation/Gait Ambulation/Gait assistance: Min assist Gait Distance (Feet): 15 Feet Assistive device: Rolling walker (2 wheels) Gait Pattern/deviations: Step-to pattern, Decreased step length - left, Decreased step length - right, Decreased stride length, Narrow base of support, Trunk flexed Gait velocity: decreased     General Gait Details: Ambulated 15 feet in room to bathroom with RW and min assist. Minor gait deviations with fair coordination. Balance deficits observed requiring min assist.  Stairs            Wheelchair Mobility    Modified Rankin (Stroke Patients Only)       Balance Overall balance assessment: Needs assistance Sitting-balance support: Bilateral upper extremity supported, Feet supported Sitting balance-Leahy Scale: Good Sitting balance - Comments: Seated EOB with UE support   Standing balance support: Bilateral upper extremity supported, During functional activity, Reliant on assistive device for balance Standing balance-Leahy Scale: Fair Standing balance comment: Standing reliant on RW. Forward lean impacting base of support                             Pertinent Vitals/Pain Pain Assessment Pain Assessment: No/denies pain    Home Living Family/patient expects to be discharged to:: Private residence Living Arrangements: Alone Available Help at Discharge: Friend(s);Available PRN/intermittently Type of Home: Mobile home Home Access: Stairs to enter Entrance Stairs-Rails: Can reach both;Right;Left Entrance Stairs-Number of Steps: 4   Home Layout: One level Home Equipment: Conservation officer, nature (2 wheels)      Prior Function Prior Level of Function : Needs assist       Physical Assist : ADLs (physical)   ADLs (physical): IADLs Mobility  Comments: Patient reports limited ambulation at home with leaning on furniture for ambulating throughout household. 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 ADLs Comments: Patient reports needing assist for ADL's that is provided by friend prn.     Hand Dominance   Dominant Hand: Right    Extremity/Trunk Assessment   Upper Extremity Assessment Upper Extremity Assessment: Generalized weakness    Lower Extremity Assessment Lower Extremity Assessment: Generalized weakness    Cervical / Trunk Assessment Cervical / Trunk Assessment: Normal  Communication   Communication: No difficulties  Cognition Arousal/Alertness: Awake/alert Behavior During Therapy: WFL for tasks assessed/performed Overall Cognitive Status: Within Functional Limits for tasks assessed                                          General Comments      Exercises     Assessment/Plan    PT Assessment Patient needs continued PT services;All further PT needs can be met in the next venue of care  PT Problem List Decreased strength;Decreased activity tolerance;Decreased balance;Decreased mobility;Decreased coordination       PT Treatment Interventions DME instruction;Gait training;Functional mobility training;Therapeutic activities;Therapeutic exercise;Balance training    PT Goals (Current goals can be found in the Care Plan section)  Acute Rehab PT Goals Patient Stated Goal: return home PT  Goal Formulation: With patient Time For Goal Achievement: 10/27/21 Potential to Achieve Goals: Good    Frequency Min 3X/week     Co-evaluation               AM-PAC PT "6 Clicks" Mobility  Outcome Measure Help needed turning from your back to your side while in a flat bed without using bedrails?: A Little Help needed moving from lying on your back to sitting on the side of a flat bed without using bedrails?: A Little Help needed moving to and from a bed to a chair (including a  wheelchair)?: A Little Help needed standing up from a chair using your arms (e.g., wheelchair or bedside chair)?: A Little Help needed to walk in hospital room?: A Little Help needed climbing 3-5 steps with a railing? : A Little 6 Click Score: 18    End of Session   Activity Tolerance: Patient limited by fatigue;Patient tolerated treatment well;Patient limited by pain Patient left: in bed;with nursing/sitter in room;with call bell/phone within reach Nurse Communication: Mobility status PT Visit Diagnosis: Unsteadiness on feet (R26.81);Other abnormalities of gait and mobility (R26.89);Muscle weakness (generalized) (M62.81);History of falling (Z91.81)    Time: 2863-8177 PT Time Calculation (min) (ACUTE ONLY): 30 min   Charges:   PT Evaluation $PT Eval Moderate Complexity: 1 Mod PT Treatments $Therapeutic Activity: 23-37 mins        3:54 PM, 10/13/21 Lestine Box, S/PT

## 2021-10-13 NOTE — Assessment & Plan Note (Addendum)
-   Patient unfortunately is a chronic heavy alcohol user and is high risk for acute alcohol withdrawal - He reports history of tremor from alcohol withdrawal. -He is a heavy user and we have placed him on a CIWA alcohol withdrawal protocol and starting Librium 25 mg p.o. 3 times daily which hopefully we can start to titrate down over the next several days if possible - Resume per protocol thiamine multivitamin and folic acid -B12 973, vitamin D 50,  folic acid level low at 3.8, supplementing now.

## 2021-10-13 NOTE — Assessment & Plan Note (Signed)
-   Patient continues smoking cigarettes and we have ordered nicotine patch for him as well as bronchodilators as needed

## 2021-10-13 NOTE — Assessment & Plan Note (Addendum)
-   Likely secondary to dehydration, treating with normal saline infusion -- Treated and resolved now

## 2021-10-13 NOTE — Hospital Course (Signed)
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  He admits to drinking one half of 1/5 of whiskey daily.  He is mostly complaining of pain to his mid and lower back.  His home was noted to be in terrible condition and he was covered and bedbugs and all of his clothing had to be removed and he had to be bathed in the ED.  He was sent for work-up in the ED including CT scans and x-rays and noted to have displaced rib fractures in the posterior left 11th and 12th ribs and mildly displaced left 10th rib fracture and severe hepatic steatosis and left L2 transverse process acute fracture.  He is notably in severe pain and noted to be hyponatremic as well.  Admission was requested for pain management and possible placement.  He has severe problems with frequent falling likely related to alcohol intoxication.

## 2021-10-13 NOTE — Assessment & Plan Note (Addendum)
-   This is most likely secondary to chronic alcohol intoxication in addition to substandard living conditions - PT evaluation recommending SNF rehab  - Fall precautions recommended --working on SNF rehab placement

## 2021-10-13 NOTE — Plan of Care (Signed)

## 2021-10-13 NOTE — Assessment & Plan Note (Addendum)
-   Secondary to chronic alcohol abuse - He should follow-up with GI on outpatient basis for ongoing surveillance

## 2021-10-13 NOTE — Assessment & Plan Note (Addendum)
-   serum B12  Is 973, low folic acid level being repleted

## 2021-10-13 NOTE — Assessment & Plan Note (Addendum)
-   Pain management orders for moderate and severe pain, titrate as needed -- still not able to get pain controlled, we have asked patient to wear TLSO brace to assist with severe rib fracture pain

## 2021-10-14 DIAGNOSIS — W57XXXD Bitten or stung by nonvenomous insect and other nonvenomous arthropods, subsequent encounter: Secondary | ICD-10-CM | POA: Diagnosis not present

## 2021-10-14 DIAGNOSIS — I10 Essential (primary) hypertension: Secondary | ICD-10-CM | POA: Diagnosis not present

## 2021-10-14 DIAGNOSIS — R52 Pain, unspecified: Secondary | ICD-10-CM | POA: Diagnosis not present

## 2021-10-14 DIAGNOSIS — R627 Adult failure to thrive: Secondary | ICD-10-CM | POA: Diagnosis not present

## 2021-10-14 LAB — COMPREHENSIVE METABOLIC PANEL
ALT: 29 U/L (ref 0–44)
AST: 39 U/L (ref 15–41)
Albumin: 3 g/dL — ABNORMAL LOW (ref 3.5–5.0)
Alkaline Phosphatase: 77 U/L (ref 38–126)
Anion gap: 8 (ref 5–15)
BUN: 17 mg/dL (ref 8–23)
CO2: 26 mmol/L (ref 22–32)
Calcium: 8.4 mg/dL — ABNORMAL LOW (ref 8.9–10.3)
Chloride: 100 mmol/L (ref 98–111)
Creatinine, Ser: 0.62 mg/dL (ref 0.61–1.24)
GFR, Estimated: >60 mL/min (ref >60)
Glucose, Bld: 112 mg/dL — ABNORMAL HIGH (ref 70–99)
Potassium: 3.4 mmol/L — ABNORMAL LOW (ref 3.5–5.1)
Sodium: 134 mmol/L — ABNORMAL LOW (ref 135–145)
Total Bilirubin: 0.9 mg/dL (ref 0.3–1.2)
Total Protein: 5.6 g/dL — ABNORMAL LOW (ref 6.5–8.1)

## 2021-10-14 LAB — MAGNESIUM: Magnesium: 1.5 mg/dL — ABNORMAL LOW (ref 1.7–2.4)

## 2021-10-14 MED ORDER — POTASSIUM CHLORIDE CRYS ER 20 MEQ PO TBCR
40.0000 meq | EXTENDED_RELEASE_TABLET | Freq: Once | ORAL | Status: AC
  Administered 2021-10-14: 40 meq via ORAL
  Filled 2021-10-14: qty 2

## 2021-10-14 MED ORDER — MAGNESIUM SULFATE 4 GM/100ML IV SOLN
4.0000 g | Freq: Once | INTRAVENOUS | Status: AC
  Administered 2021-10-14: 4 g via INTRAVENOUS
  Filled 2021-10-14: qty 100

## 2021-10-14 NOTE — Progress Notes (Signed)
Patient aspirin and lisinopril put in bag and taken to pharmacy via supervisor.

## 2021-10-14 NOTE — NC FL2 (Addendum)
[place] MEDICAID FL2 LEVEL OF CARE SCREENING TOOL     IDENTIFICATION  Patient Name: Larry [name] Birthdate: 04/02/1951 Sex: male Admission Date (Current Location): 10/13/2021  [facility] and [place] Number:  [employer] and Address:  [facility],  [address] [place] [address]      Provider Number: [id number]  Attending Physician Name and Address:  Cleora Fleet, MD  Relative Name and Phone Number:       Current Level of Care: Hospital Recommended Level of Care: Skilled Nursing Facility Prior Approval Number:    Date Approved/Denied:   PASRR Number: [id number]  Discharge Plan: SNF    Current Diagnoses: Patient Active Problem List   Diagnosis Date Noted   Uncontrolled pain 10/13/2021   Failure to thrive in adult 10/13/2021   Lumbar transverse process fracture 10/13/2021   Alcohol abuse 10/13/2021   Severe hepatic steatosis 10/13/2021   Bedbug infestation 10/13/2021   Multiple rib fractures 10/13/2021   Macrocytic anemia 10/13/2021   Hyponatremia 10/13/2021   Protein-calorie malnutrition, severe 03/10/2021   Alcohol intoxication (HCC) 03/09/2021   Generalized weakness 03/09/2021   Elevated troponin 03/09/2021   Physical deconditioning 03/09/2021   Elevated AST (SGOT) 03/09/2021   Thrombocytopenia (HCC) 03/09/2021   Elevated MCV 03/09/2021   High anion gap metabolic acidosis 03/09/2021   Dehydration 03/09/2021   Pneumoconiosis, coal, 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   Eye discharge 03/09/2021   Alcohol withdrawal (HCC) 02/06/2021   Falls frequently 02/06/2021   Essential hypertension 02/06/2021   Proximal humerus fracture 09/24/2014    Orientation RESPIRATION BLADDER Height & Weight     Self, Time, Situation, Place  Normal Continent Weight: 126 lb 5.2 oz (57.3 kg) Height:  6\' 2"  (188 cm)  BEHAVIORAL SYMPTOMS/MOOD NEUROLOGICAL BOWEL NUTRITION STATUS      Continent Diet (regular)  AMBULATORY STATUS COMMUNICATION OF NEEDS Skin    Limited Assist Verbally Normal                       Personal Care Assistance Level of Assistance  Bathing, Feeding, Dressing Bathing Assistance: Limited assistance Feeding assistance: Independent Dressing Assistance: Limited assistance     Functional Limitations Info  Sight, Hearing, Speech Sight Info: Adequate Hearing Info: Adequate Speech Info: Adequate    SPECIAL CARE FACTORS FREQUENCY  PT (By licensed PT)     PT Frequency: 5x/week              Contractures Contractures Info: Not present    Additional Factors Info  Code Status, Allergies Code Status Info: full code Allergies Info: NKA           Current Medications (10/14/2021):  This is the current hospital active medication list Current Facility-Administered Medications  Medication Dose Route Frequency Provider Last Rate Last Admin   chlordiazePOXIDE (LIBRIUM) capsule 25 mg  25 mg Oral TID Laural Benes, Clanford L, MD   25 mg at 10/14/21 0834   cholecalciferol (VITAMIN D3) tablet 1,000 Units  1,000 Units Oral Daily Johnson, Clanford L, MD   1,000 Units at 10/14/21 0834   docusate sodium (COLACE) capsule 100 mg  100 mg Oral BID Johnson, Clanford L, MD   100 mg at 10/14/21 0834   enoxaparin (LOVENOX) injection 40 mg  40 mg Subcutaneous Q24H Johnson, Clanford L, MD   40 mg at 10/14/21 1013   feeding supplement (ENSURE ENLIVE / ENSURE PLUS) liquid 237 mL  237 mL Oral BID BM Johnson, Clanford L, MD   237  mL at 10/14/21 1429   fentaNYL (SUBLIMAZE) injection 25 mcg  25 mcg Intravenous Q2H PRN Laural Benes, Clanford L, MD   25 mcg at 10/13/21 1513   folic acid (FOLVITE) tablet 1 mg  1 mg Oral Daily Johnson, Clanford L, MD   1 mg at 10/14/21 0834   ibuprofen (ADVIL) tablet 400 mg  400 mg Oral Q6H PRN Johnson, Clanford L, MD       lisinopril (ZESTRIL) tablet 10 mg  10 mg Oral Daily Johnson, Clanford L, MD   10 mg at 10/14/21 0834   LORazepam (ATIVAN) tablet 1-4 mg  1-4 mg Oral Q1H PRN Johnson, Clanford L, MD       Or    LORazepam (ATIVAN) injection 1-4 mg  1-4 mg Intravenous Q1H PRN Laural Benes, Clanford L, MD       multivitamin with minerals tablet 1 tablet  1 tablet Oral Daily Johnson, Clanford L, MD   1 tablet at 10/14/21 0834   nicotine (NICODERM CQ - dosed in mg/24 hours) patch 21 mg  21 mg Transdermal Daily Johnson, Clanford L, MD   21 mg at 10/14/21 0836   ondansetron (ZOFRAN) tablet 4 mg  4 mg Oral Q6H PRN Johnson, Clanford L, MD       Or   ondansetron (ZOFRAN) injection 4 mg  4 mg Intravenous Q6H PRN Johnson, Clanford L, MD       oxyCODONE (Oxy IR/ROXICODONE) immediate release tablet 5 mg  5 mg Oral Q4H PRN Johnson, Clanford L, MD       thiamine tablet 100 mg  100 mg Oral Daily Johnson, Clanford L, MD   100 mg at 10/14/21 8657   Or   thiamine (B-1) injection 100 mg  100 mg Intravenous Daily Johnson, Clanford L, MD       traZODone (DESYREL) tablet 25 mg  25 mg Oral QHS Johnson, Clanford L, MD   25 mg at 10/13/21 2159   vitamin B-12 (CYANOCOBALAMIN) tablet 1,000 mcg  1,000 mcg Oral Daily Laural Benes, Clanford L, MD   1,000 mcg at 10/14/21 8469     Discharge Medications: Please see discharge summary for a list of discharge medications.  Relevant Imaging Results:  Relevant Lab Results:   Additional Information SS# [id number] Patient is NOT vaccinated for COVID.  Day Greb, Juleen China, LCSW

## 2021-10-14 NOTE — TOC Initial Note (Addendum)
Transition of Care [facility]) - Initial/Assessment Note    Patient Details  Name: Larry [name] MRN: [id number] Date of Birth: 09/07/1950  Transition of [facility]) CM/SW Contact:    Annice Needy, LCSW Phone Number: 10/14/2021, 2:55 PM  Clinical Narrative:                 Patient from home alone. Admitted for uncontrolled pain. PT recommends SNF. Patient is agreeable to SNF. Referred [place] SNFs in county.  Attending consulted TOC for APS referral due to to patient's condition upon admission. Referral made to APS.  Patient is not vaccinated for COVID.  Expected Discharge Plan: Skilled Nursing Facility Barriers to Discharge: Continued Medical Work up   Patient Goals and CMS Choice Patient states their goals for this hospitalization and ongoing recovery are:: SNF then home      Expected Discharge Plan and Services Expected Discharge Plan: Skilled Nursing Facility     Post Acute Care Choice: Skilled Nursing Facility Living arrangements for the past 2 months: Single Family Home                                      Prior Living Arrangements/Services Living arrangements for the past 2 months: Single Family Home Lives with:: Self Patient language and need for interpreter reviewed:: Yes Do you feel safe going back to the place where you live?: Yes      Need for Family Participation in Patient Care: Yes (Comment) Care giver support system in place?: Yes (comment) Current home services: DME (2 wheeled RW) Criminal Activity/Legal Involvement Pertinent to Current Situation/Hospitalization: No - Comment as needed  Activities of Daily Living Home Assistive Devices/Equipment: Walker (specify type) ADL Screening (condition at time of admission) Patient's cognitive ability adequate to safely complete daily activities?: Yes Is the patient deaf or have difficulty hearing?: No Does the patient have difficulty seeing, even when wearing glasses/contacts?: No Does the patient have  difficulty concentrating, remembering, or making decisions?: No Patient able to express need for assistance with ADLs?: Yes Does the patient have difficulty dressing or bathing?: Yes Independently performs ADLs?: No Communication: Independent Dressing (OT): Needs assistance Is this a change from baseline?: Change from baseline, expected to last <3days Grooming: Needs assistance Is this a change from baseline?: Change from baseline, expected to last <3 days Feeding: Independent Bathing: Needs assistance Is this a change from baseline?: Change from baseline, expected to last <3 days Toileting: Needs assistance Is this a change from baseline?: Change from baseline, expected to last <3 days In/Out Bed: Needs assistance Is this a change from baseline?: Change from baseline, expected to last <3 days Walks in Home: Needs assistance Is this a change from baseline?: Change from baseline, expected to last <3 days Does the patient have difficulty walking or climbing stairs?: Yes Weakness of Legs: Both Weakness of Arms/Hands: None  Permission Sought/Granted                  Emotional Assessment     Affect (typically observed): Appropriate Orientation: : Oriented to Self, Oriented to Place, Oriented to  Time, Oriented to Situation Alcohol / Substance Use: Not Applicable    Admission diagnosis:  Uncontrolled pain [R52] Patient Active Problem List   Diagnosis Date Noted   Uncontrolled pain 10/13/2021   Failure to thrive in adult 10/13/2021   Lumbar transverse process fracture 10/13/2021   Alcohol abuse 10/13/2021  Severe hepatic steatosis 10/13/2021   Bedbug infestation 10/13/2021   Multiple rib fractures 10/13/2021   Macrocytic anemia 10/13/2021   Hyponatremia 10/13/2021   Protein-calorie malnutrition, severe 03/10/2021   Alcohol intoxication (HCC) 03/09/2021   Generalized weakness 03/09/2021   Elevated troponin 03/09/2021   Physical deconditioning 03/09/2021   Elevated AST  (SGOT) 03/09/2021   Thrombocytopenia (HCC) 03/09/2021   Elevated MCV 03/09/2021   High anion gap metabolic acidosis 03/09/2021   Dehydration 03/09/2021   Pneumoconiosis, coal, 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   Eye discharge 03/09/2021   Alcohol withdrawal (HCC) 02/06/2021   Falls frequently 02/06/2021   Essential hypertension 02/06/2021   Proximal humerus fracture 09/24/2014   PCP:  Benita Stabile, MD Pharmacy:   [facility] [place] [place] - [address] [place] [address] Phone: [phone] Fax: [phone]     Social Determinants of Health (SDOH) Interventions    Readmission Risk Interventions    03/10/2021    2:14 PM 02/11/2021    1:11 PM  Readmission Risk Prevention Plan  Medication Screening Complete   Transportation Screening Complete Complete  Home Care Screening  Complete  Medication Review (RN CM)  Complete

## 2021-10-15 DIAGNOSIS — R627 Adult failure to thrive: Secondary | ICD-10-CM | POA: Diagnosis not present

## 2021-10-15 DIAGNOSIS — W57XXXD Bitten or stung by nonvenomous insect and other nonvenomous arthropods, subsequent encounter: Secondary | ICD-10-CM | POA: Diagnosis not present

## 2021-10-15 DIAGNOSIS — R52 Pain, unspecified: Secondary | ICD-10-CM | POA: Diagnosis not present

## 2021-10-15 DIAGNOSIS — I10 Essential (primary) hypertension: Secondary | ICD-10-CM | POA: Diagnosis not present

## 2021-10-15 LAB — BASIC METABOLIC PANEL
Anion gap: 6 (ref 5–15)
BUN: 10 mg/dL (ref 8–23)
CO2: 26 mmol/L (ref 22–32)
Calcium: 8.6 mg/dL — ABNORMAL LOW (ref 8.9–10.3)
Chloride: 103 mmol/L (ref 98–111)
Creatinine, Ser: 0.55 mg/dL — ABNORMAL LOW (ref 0.61–1.24)
GFR, Estimated: >60 mL/min (ref >60)
Glucose, Bld: 104 mg/dL — ABNORMAL HIGH (ref 70–99)
Potassium: 3.8 mmol/L (ref 3.5–5.1)
Sodium: 135 mmol/L (ref 135–145)

## 2021-10-15 LAB — MAGNESIUM: Magnesium: 1.8 mg/dL (ref 1.7–2.4)

## 2021-10-15 MED ORDER — MAGNESIUM SULFATE 4 GM/100ML IV SOLN
4.0000 g | Freq: Once | INTRAVENOUS | Status: AC
  Administered 2021-10-15: 4 g via INTRAVENOUS
  Filled 2021-10-15: qty 100

## 2021-10-15 NOTE — Progress Notes (Signed)
Patient does not have any bed offers at this time. CSW will present bed offers to patient if they become available.  Edwin Dada, MSW, LCSW Transitions of Care  Clinical Social Worker II [phone]

## 2021-10-16 ENCOUNTER — Inpatient Hospital Stay (HOSPITAL_COMMUNITY): Payer: Medicare HMO

## 2021-10-16 DIAGNOSIS — R627 Adult failure to thrive: Secondary | ICD-10-CM | POA: Diagnosis not present

## 2021-10-16 DIAGNOSIS — I10 Essential (primary) hypertension: Secondary | ICD-10-CM | POA: Diagnosis not present

## 2021-10-16 DIAGNOSIS — R400 Somnolence: Secondary | ICD-10-CM | POA: Diagnosis not present

## 2021-10-16 DIAGNOSIS — R52 Pain, unspecified: Secondary | ICD-10-CM | POA: Diagnosis not present

## 2021-10-16 DIAGNOSIS — W57XXXD Bitten or stung by nonvenomous insect and other nonvenomous arthropods, subsequent encounter: Secondary | ICD-10-CM | POA: Diagnosis not present

## 2021-10-16 LAB — CBC
HCT: 36.3 % — ABNORMAL LOW (ref 39.0–52.0)
Hemoglobin: 12.1 g/dL — ABNORMAL LOW (ref 13.0–17.0)
MCH: 34.6 pg — ABNORMAL HIGH (ref 26.0–34.0)
MCHC: 33.3 g/dL (ref 30.0–36.0)
MCV: 103.7 fL — ABNORMAL HIGH (ref 80.0–100.0)
Platelets: 157 10*3/uL (ref 150–400)
RBC: 3.5 MIL/uL — ABNORMAL LOW (ref 4.22–5.81)
RDW: 13.5 % (ref 11.5–15.5)
WBC: 7 10*3/uL (ref 4.0–10.5)
nRBC: 0 % (ref 0.0–0.2)

## 2021-10-16 LAB — GLUCOSE, CAPILLARY: Glucose-Capillary: 116 mg/dL — ABNORMAL HIGH (ref 70–99)

## 2021-10-16 LAB — PHOSPHORUS: Phosphorus: 3.1 mg/dL (ref 2.5–4.6)

## 2021-10-16 LAB — MAGNESIUM: Magnesium: 2.1 mg/dL (ref 1.7–2.4)

## 2021-10-16 MED ORDER — LISINOPRIL 5 MG PO TABS
5.0000 mg | ORAL_TABLET | Freq: Every day | ORAL | Status: DC
  Administered 2021-10-17: 5 mg via ORAL
  Filled 2021-10-16 (×3): qty 1

## 2021-10-16 MED ORDER — LACTATED RINGERS IV BOLUS
250.0000 mL | Freq: Once | INTRAVENOUS | Status: AC
  Administered 2021-10-16: 250 mL via INTRAVENOUS

## 2021-10-16 MED ORDER — OXYCODONE HCL 5 MG PO TABS
2.5000 mg | ORAL_TABLET | ORAL | Status: DC | PRN
  Filled 2021-10-16: qty 1

## 2021-10-16 MED ORDER — IPRATROPIUM-ALBUTEROL 0.5-2.5 (3) MG/3ML IN SOLN
3.0000 mL | Freq: Four times a day (QID) | RESPIRATORY_TRACT | Status: DC
  Administered 2021-10-16 – 2021-10-26 (×39): 3 mL via RESPIRATORY_TRACT
  Filled 2021-10-16 (×36): qty 3

## 2021-10-16 MED ORDER — FENTANYL CITRATE PF 50 MCG/ML IJ SOSY: 12.5000 ug | PREFILLED_SYRINGE | INTRAMUSCULAR | Status: DC | PRN

## 2021-10-16 MED ORDER — LACTATED RINGERS IV SOLN: INTRAVENOUS | Status: AC

## 2021-10-16 MED ORDER — CHLORDIAZEPOXIDE HCL 5 MG PO CAPS
5.0000 mg | ORAL_CAPSULE | Freq: Three times a day (TID) | ORAL | Status: DC
  Administered 2021-10-16 – 2021-10-17 (×4): 5 mg via ORAL
  Filled 2021-10-16 (×5): qty 1

## 2021-10-16 NOTE — Assessment & Plan Note (Addendum)
--   likely from librium and pain meds, I have decreased librium to 5 mg to PRN only and decreased oxycodone to 2.5 mg

## 2021-10-16 NOTE — Progress Notes (Signed)
   10/16/21 1339  Assess: MEWS Score  Temp 98.8 F (37.1 C)  BP (!) 97/56  MAP (mmHg) 69  Pulse Rate (!) 102  Resp (!) 26  SpO2 95 %  O2 Device Room Air  Assess: MEWS Score  MEWS Temp 0  MEWS Systolic 1  MEWS Pulse 1  MEWS RR 2  MEWS LOC 0  MEWS Score 4  MEWS Score Color Red  Assess: if the MEWS score is Yellow or Red  Were vital signs taken at a resting state? Yes  Focused Assessment No change from prior assessment  Does the patient meet 2 or more of the SIRS criteria? No  MEWS guidelines implemented *See Row Information* Yes  Treat  MEWS Interventions Administered prn meds/treatments  Take Vital Signs  Increase Vital Sign Frequency  Red: Q 1hr X 4 then Q 4hr X 4, if remains red, continue Q 4hrs  Escalate  MEWS: Escalate Red: discuss with charge nurse/RN and provider, consider discussing with RRT  Notify: Charge Nurse/RN  Name of Charge Nurse/RN Notified Lonn Georgia, RN  Date Charge Nurse/RN Notified 10/16/21  Notify: Provider  Provider Name/Title Dr. Laural Benes  Date Provider Notified 10/16/21  Document  Patient Outcome Stabilized after interventions  Assess: SIRS CRITERIA  SIRS Temperature  0  SIRS Pulse 1  SIRS Respirations  1  SIRS WBC 0  SIRS Score Sum  2

## 2021-10-17 ENCOUNTER — Encounter (HOSPITAL_COMMUNITY): Payer: Self-pay | Admitting: Family Medicine

## 2021-10-17 DIAGNOSIS — K760 Fatty (change of) liver, not elsewhere classified: Secondary | ICD-10-CM

## 2021-10-17 DIAGNOSIS — Z515 Encounter for palliative care: Secondary | ICD-10-CM | POA: Diagnosis not present

## 2021-10-17 DIAGNOSIS — F1010 Alcohol abuse, uncomplicated: Secondary | ICD-10-CM | POA: Diagnosis not present

## 2021-10-17 DIAGNOSIS — R52 Pain, unspecified: Secondary | ICD-10-CM | POA: Diagnosis not present

## 2021-10-17 DIAGNOSIS — Z7189 Other specified counseling: Secondary | ICD-10-CM

## 2021-10-17 DIAGNOSIS — W57XXXD Bitten or stung by nonvenomous insect and other nonvenomous arthropods, subsequent encounter: Secondary | ICD-10-CM | POA: Diagnosis not present

## 2021-10-17 DIAGNOSIS — I10 Essential (primary) hypertension: Secondary | ICD-10-CM | POA: Diagnosis not present

## 2021-10-17 LAB — BASIC METABOLIC PANEL
Anion gap: 6 (ref 5–15)
BUN: 15 mg/dL (ref 8–23)
CO2: 28 mmol/L (ref 22–32)
Calcium: 8.3 mg/dL — ABNORMAL LOW (ref 8.9–10.3)
Chloride: 101 mmol/L (ref 98–111)
Creatinine, Ser: 0.73 mg/dL (ref 0.61–1.24)
GFR, Estimated: >60 mL/min (ref >60)
Glucose, Bld: 110 mg/dL — ABNORMAL HIGH (ref 70–99)
Potassium: 3.6 mmol/L (ref 3.5–5.1)
Sodium: 135 mmol/L (ref 135–145)

## 2021-10-17 NOTE — Progress Notes (Signed)
Palliative: [name], Larry [name], is sitting on the edge of the bed.  He appears acutely/chronically ill and very frail, cachectic.  Physical therapy is present at bedside working with him for mobility.  Limited interactions today.  Encouragement offered.  PMT to follow tomorrow.  Conference with attending, bedside nursing staff, transition of care team, physical therapy, related to patient condition, needs, goals of care, disposition.  Plan: Inpatient palliative consult pending.  Accepted for short-term rehab at [facility].  25 minutes [name], NP Palliative medicine team Team phone [phone] Greater than 50% of this time was spent counseling and coordinating care related to the above assessment and plan.

## 2021-10-18 ENCOUNTER — Encounter (HOSPITAL_COMMUNITY): Payer: Self-pay | Admitting: Family Medicine

## 2021-10-18 DIAGNOSIS — F1010 Alcohol abuse, uncomplicated: Secondary | ICD-10-CM | POA: Diagnosis not present

## 2021-10-18 DIAGNOSIS — T17908A Unspecified foreign body in respiratory tract, part unspecified causing other injury, initial encounter: Secondary | ICD-10-CM | POA: Diagnosis not present

## 2021-10-18 DIAGNOSIS — S2243XA Multiple fractures of ribs, bilateral, initial encounter for closed fracture: Secondary | ICD-10-CM | POA: Diagnosis not present

## 2021-10-18 DIAGNOSIS — W57XXXD Bitten or stung by nonvenomous insect and other nonvenomous arthropods, subsequent encounter: Secondary | ICD-10-CM | POA: Diagnosis not present

## 2021-10-18 DIAGNOSIS — R52 Pain, unspecified: Secondary | ICD-10-CM | POA: Diagnosis not present

## 2021-10-18 DIAGNOSIS — S32009A Unspecified fracture of unspecified lumbar vertebra, initial encounter for closed fracture: Secondary | ICD-10-CM

## 2021-10-18 DIAGNOSIS — Z7189 Other specified counseling: Secondary | ICD-10-CM | POA: Diagnosis not present

## 2021-10-18 DIAGNOSIS — Z515 Encounter for palliative care: Secondary | ICD-10-CM | POA: Diagnosis not present

## 2021-10-18 MED ORDER — CHLORDIAZEPOXIDE HCL 5 MG PO CAPS: 5.0000 mg | ORAL_CAPSULE | Freq: Three times a day (TID) | ORAL | Status: DC | PRN

## 2021-10-18 NOTE — Progress Notes (Signed)
Pt had a lot of coughing while trying to swallow morning meds, despite being crushed in applesauce. Pt spit meds out immediately. Pt repositioned in bed, will continue to monitor closely. Palliative NP in room at bedside.

## 2021-10-18 NOTE — Assessment & Plan Note (Signed)
--   Pt had a bad choking spell when taking meds this morning 6/27, likely aspirated, will request an SLP evaluation, did not get his morning meds today

## 2021-10-19 ENCOUNTER — Inpatient Hospital Stay (HOSPITAL_COMMUNITY): Payer: Medicare HMO

## 2021-10-19 DIAGNOSIS — T17908D Unspecified foreign body in respiratory tract, part unspecified causing other injury, subsequent encounter: Secondary | ICD-10-CM | POA: Diagnosis not present

## 2021-10-19 DIAGNOSIS — T17908A Unspecified foreign body in respiratory tract, part unspecified causing other injury, initial encounter: Secondary | ICD-10-CM | POA: Diagnosis not present

## 2021-10-19 DIAGNOSIS — F1010 Alcohol abuse, uncomplicated: Secondary | ICD-10-CM | POA: Diagnosis not present

## 2021-10-19 DIAGNOSIS — J9601 Acute respiratory failure with hypoxia: Secondary | ICD-10-CM | POA: Diagnosis not present

## 2021-10-19 DIAGNOSIS — R52 Pain, unspecified: Secondary | ICD-10-CM | POA: Diagnosis not present

## 2021-10-19 DIAGNOSIS — R627 Adult failure to thrive: Secondary | ICD-10-CM | POA: Diagnosis not present

## 2021-10-19 DIAGNOSIS — Z7189 Other specified counseling: Secondary | ICD-10-CM | POA: Diagnosis not present

## 2021-10-19 LAB — BLOOD GAS, ARTERIAL
Acid-Base Excess: 4.7 mmol/L — ABNORMAL HIGH (ref 0.0–2.0)
Bicarbonate: 29.2 mmol/L — ABNORMAL HIGH (ref 20.0–28.0)
Drawn by: 38235
FIO2: 100 %
O2 Saturation: 100 %
Patient temperature: 37
pCO2 arterial: 42 mmHg (ref 32–48)
pH, Arterial: 7.45 (ref 7.35–7.45)
pO2, Arterial: 150 mmHg — ABNORMAL HIGH (ref 83–108)

## 2021-10-19 LAB — CBC
HCT: 28.6 % — ABNORMAL LOW (ref 39.0–52.0)
Hemoglobin: 10.1 g/dL — ABNORMAL LOW (ref 13.0–17.0)
MCH: 35.6 pg — ABNORMAL HIGH (ref 26.0–34.0)
MCHC: 35.3 g/dL (ref 30.0–36.0)
MCV: 100.7 fL — ABNORMAL HIGH (ref 80.0–100.0)
Platelets: 278 10*3/uL (ref 150–400)
RBC: 2.84 MIL/uL — ABNORMAL LOW (ref 4.22–5.81)
RDW: 13.3 % (ref 11.5–15.5)
WBC: 6.1 10*3/uL (ref 4.0–10.5)
nRBC: 0 % (ref 0.0–0.2)

## 2021-10-19 LAB — BASIC METABOLIC PANEL
Anion gap: 8 (ref 5–15)
BUN: 15 mg/dL (ref 8–23)
CO2: 25 mmol/L (ref 22–32)
Calcium: 8.7 mg/dL — ABNORMAL LOW (ref 8.9–10.3)
Chloride: 100 mmol/L (ref 98–111)
Creatinine, Ser: 0.52 mg/dL — ABNORMAL LOW (ref 0.61–1.24)
GFR, Estimated: >60 mL/min (ref >60)
Glucose, Bld: 112 mg/dL — ABNORMAL HIGH (ref 70–99)
Potassium: 3.9 mmol/L (ref 3.5–5.1)
Sodium: 133 mmol/L — ABNORMAL LOW (ref 135–145)

## 2021-10-19 LAB — MAGNESIUM: Magnesium: 1.9 mg/dL (ref 1.7–2.4)

## 2021-10-19 MED ORDER — THIAMINE HCL 100 MG/ML IJ SOLN
100.0000 mg | Freq: Every day | INTRAMUSCULAR | Status: AC
Start: 2021-10-19 — End: 2021-10-21
  Administered 2021-10-20 – 2021-10-21 (×3): 100 mg via INTRAVENOUS
  Filled 2021-10-19 (×3): qty 2

## 2021-10-19 MED ORDER — LORAZEPAM 2 MG/ML IJ SOLN
0.0000 mg | Freq: Four times a day (QID) | INTRAMUSCULAR | Status: DC
  Filled 2021-10-19 (×2): qty 2

## 2021-10-19 MED ORDER — NOREPINEPHRINE 4 MG/250ML-% IV SOLN: 0.0000 ug/min | INTRAVENOUS | Status: DC

## 2021-10-19 MED ORDER — SODIUM CHLORIDE 0.9 % IV SOLN
3.0000 g | Freq: Four times a day (QID) | INTRAVENOUS | Status: AC
  Administered 2021-10-19 – 2021-10-29 (×40): 3 g via INTRAVENOUS
  Filled 2021-10-19 (×3): qty 8
  Filled 2021-10-19: qty 3
  Filled 2021-10-19 (×5): qty 8
  Filled 2021-10-19 (×2): qty 3
  Filled 2021-10-19 (×6): qty 8
  Filled 2021-10-19: qty 3
  Filled 2021-10-19 (×23): qty 8
  Filled 2021-10-19: qty 3

## 2021-10-19 MED ORDER — PROPOFOL 1000 MG/100ML IV EMUL
5.0000 ug/kg/min | INTRAVENOUS | Status: DC
  Administered 2021-10-19: 15 ug/kg/min via INTRAVENOUS
  Administered 2021-10-20: 30 ug/kg/min via INTRAVENOUS
  Administered 2021-10-20: 20 ug/kg/min via INTRAVENOUS
  Administered 2021-10-21: 40 ug/kg/min via INTRAVENOUS
  Administered 2021-10-21: 35 ug/kg/min via INTRAVENOUS
  Filled 2021-10-19 (×4): qty 100

## 2021-10-19 MED ORDER — MIDAZOLAM-SODIUM CHLORIDE 100-0.9 MG/100ML-% IV SOLN
INTRAVENOUS | Status: AC
  Administered 2021-10-19: 1 mg/h via INTRAVENOUS
  Filled 2021-10-19: qty 100

## 2021-10-19 MED ORDER — SUCCINYLCHOLINE CHLORIDE 200 MG/10ML IV SOSY
PREFILLED_SYRINGE | INTRAVENOUS | Status: AC
  Administered 2021-10-19: 100 mg
  Filled 2021-10-19: qty 10

## 2021-10-19 MED ORDER — DEXTROSE-NACL 5-0.9 % IV SOLN: INTRAVENOUS | Status: DC

## 2021-10-19 MED ORDER — ETOMIDATE 2 MG/ML IV SOLN
INTRAVENOUS | Status: AC
  Administered 2021-10-19: 20 mg
  Filled 2021-10-19: qty 20

## 2021-10-19 MED ORDER — NOREPINEPHRINE 4 MG/250ML-% IV SOLN
INTRAVENOUS | Status: AC
  Administered 2021-10-19: 2 ug/min via INTRAVENOUS
  Filled 2021-10-19: qty 250

## 2021-10-19 MED ORDER — MIDAZOLAM-SODIUM CHLORIDE 100-0.9 MG/100ML-% IV SOLN: 0.5000 mg/h | INTRAVENOUS | Status: DC

## 2021-10-19 MED ORDER — MIDAZOLAM HCL 2 MG/2ML IJ SOLN
INTRAMUSCULAR | Status: AC
  Filled 2021-10-19: qty 2

## 2021-10-19 MED ORDER — LORAZEPAM 2 MG/ML IJ SOLN
2.0000 mg | Freq: Once | INTRAMUSCULAR | Status: AC
  Administered 2021-10-19: 2 mg via INTRAVENOUS
  Filled 2021-10-19: qty 1

## 2021-10-19 MED ORDER — ACETAMINOPHEN 650 MG RE SUPP: 650.0000 mg | RECTAL | Status: DC | PRN

## 2021-10-19 MED ORDER — LORAZEPAM 1 MG PO TABS: 1.0000 mg | ORAL_TABLET | ORAL | Status: DC | PRN

## 2021-10-19 MED ORDER — FOLIC ACID 5 MG/ML IJ SOLN
1.0000 mg | Freq: Every day | INTRAMUSCULAR | Status: AC
  Administered 2021-10-20 – 2021-10-25 (×7): 1 mg via INTRAVENOUS
  Filled 2021-10-19 (×7): qty 0.2

## 2021-10-19 MED ORDER — FENTANYL 2500MCG IN NS 250ML (10MCG/ML) PREMIX INFUSION
0.0000 ug/h | INTRAVENOUS | Status: DC
  Administered 2021-10-19: 25 ug/h via INTRAVENOUS
  Filled 2021-10-19: qty 250

## 2021-10-19 MED ORDER — LORAZEPAM 2 MG/ML IJ SOLN: 0.0000 mg | Freq: Two times a day (BID) | INTRAMUSCULAR | Status: DC

## 2021-10-19 MED ORDER — SODIUM CHLORIDE 0.9 % IV SOLN
250.0000 mL | INTRAVENOUS | Status: DC
  Administered 2021-10-21: 250 mL via INTRAVENOUS

## 2021-10-19 MED ORDER — ROCURONIUM BROMIDE 10 MG/ML (PF) SYRINGE
PREFILLED_SYRINGE | INTRAVENOUS | Status: AC
  Filled 2021-10-19: qty 10

## 2021-10-19 MED ORDER — PROPOFOL 1000 MG/100ML IV EMUL
INTRAVENOUS | Status: AC
  Filled 2021-10-19: qty 100

## 2021-10-19 MED ORDER — LORAZEPAM 2 MG/ML IJ SOLN
1.0000 mg | INTRAMUSCULAR | Status: DC | PRN
  Administered 2021-10-19: 4 mg via INTRAVENOUS

## 2021-10-19 MED ORDER — THIAMINE HCL 100 MG/ML IJ SOLN: 100.0000 mg | INTRAMUSCULAR | Status: DC

## 2021-10-19 MED ORDER — MIDAZOLAM HCL 2 MG/2ML IJ SOLN
2.0000 mg | INTRAMUSCULAR | Status: AC | PRN
  Administered 2021-10-20: 2 mg via INTRAVENOUS
  Filled 2021-10-19: qty 2

## 2021-10-19 MED ORDER — FENTANYL CITRATE (PF) 100 MCG/2ML IJ SOLN
INTRAMUSCULAR | Status: AC
  Filled 2021-10-19: qty 2

## 2021-10-19 MED ORDER — NOREPINEPHRINE 4 MG/250ML-% IV SOLN
0.0000 ug/min | INTRAVENOUS | Status: AC
  Administered 2021-10-20: 3 ug/min via INTRAVENOUS

## 2021-10-19 MED ORDER — ALBUTEROL SULFATE (2.5 MG/3ML) 0.083% IN NEBU: 2.5000 mg | INHALATION_SOLUTION | RESPIRATORY_TRACT | Status: DC | PRN

## 2021-10-19 MED ORDER — M.V.I. ADULT IV INJ
INTRAVENOUS | Status: AC
  Filled 2021-10-19 (×2): qty 1000

## 2021-10-19 MED ORDER — CHLORHEXIDINE GLUCONATE CLOTH 2 % EX PADS
6.0000 | MEDICATED_PAD | Freq: Every day | CUTANEOUS | Status: DC
  Administered 2021-10-19 – 2021-11-02 (×16): 6 via TOPICAL

## 2021-10-19 NOTE — Progress Notes (Signed)
Palliative:    [name], [name], is sitting up in the [place] chair in his room.  Physical therapy has just finished working with him.  Larry [name] will briefly make but not keep eye contact.  He appears acutely/chronically ill and very frail.  He is alert and oriented to person; tells me that we are at "[facility]" but that we are in [place]; he offers the year, 1923, but then states 2023, when asked the month he states "May".  Mostly he seems oriented, able to make his needs known.  But as we continue in our discussion, and I share that his landlord was able to get his dog from his home, he tells me that he has 2 cats and a cage in the corner of the hospital room.  He continues to state this even when redirected.  We talk about his acute health concerns, his frailty and weakness.  Throughout our conversation he has wet productive cough with wet voice quality.  He is able to have productive cough, but it takes him several tries to clear his airway/mouth.  I shared that speech therapy will follow-up with him today.  I shared that he seems to need short-term rehab.  He does not disagree, but instead asks where he would go.  I shared that our transition of care team is working to help him find a place.  We talk about healthcare power of attorney.  At this point Larry [name] tells me that he has not seen his wife of 18 years, in 4 months.  He does not offer her telephone number.   He also tells me that he has 2 sons living in [place], but he has not had contact with them in 9 years.  When asked who would make choices if he could not, he states that his friend, Larry [name], helps him.  I shared that it may be too much to ask Larry [name] to make life and death choices for him.  He states understanding.  We talk about DSS social work, someone who could help make sure his needs were met.  At this point he is agreeable to DSS referral.    We talk about CODE STATUS, the concept of "treat the treatable, but  allowing natural passing".  At this point Larry [name] states that he would like to be full scope/full code.  Conference with attending, bedside nursing staff, transition of care team related to patient condition, needs, goals of care, disposition.  Plan: At this point continue full scope/full code.  Time for outcomes.  Anticipate need for short-term rehab, possible need for long-term care.  Agreeable for APS/guardianship referral.  50 minutes Larry Axe, NP  Palliative medicine team Team phone [phone] Greater than 50% of this time was spent counseling and coordinating care related to the above assessment and plan.

## 2021-10-19 NOTE — Progress Notes (Signed)
Attempted to call report to ICU. Spoke with [employer]. They are currently switching beds around so they have an empty bed for patient.

## 2021-10-19 NOTE — ED Provider Notes (Signed)
Department of Emergency Medicine CODE BLUE CONSULTATION    Code Blue CONSULT NOTE  Chief Complaint: Cardiac arrest/unresponsive   Level V Caveat: Unresponsive  History of present illness: I was contacted by the hospital for a rapid response airway emergency upstairs and presented to the patient's bedside.    ROS: Unable to obtain, Level V caveat  Scheduled Meds:  Chlorhexidine Gluconate Cloth  6 each Topical Daily   cholecalciferol  1,000 Units Oral Daily   enoxaparin (LOVENOX) injection  40 mg Subcutaneous Q24H   feeding supplement  237 mL Oral BID BM   fentaNYL       ipratropium-albuterol  3 mL Nebulization Q6H   lisinopril  5 mg Oral Daily   LORazepam  0-4 mg Intravenous Q6H   Followed by   Melene Muller ON 10/21/2021] LORazepam  0-4 mg Intravenous Q12H   midazolam       nicotine  21 mg Transdermal Daily   rocuronium bromide       Continuous Infusions:  ampicillin-sulbactam (UNASYN) IV 3 g (10/19/21 1353)   dextrose 5 % and 0.9% NaCl 1,000 mL with M.V.I. 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   Hypertension    Shingles    Shortness of breath dyspnea    SOB with exertion   Thyroid disease    hypothyroidism   Past Surgical History:  Procedure Laterality Date   CATARACT EXTRACTION W/ INTRAOCULAR LENS  IMPLANT, BILATERAL     KNEE SURGERY     ORIF HUMERUS FRACTURE Right 09/24/2014   Procedure: OPEN REDUCTION INTERNAL FIXATION (ORIF) PROXIMAL HUMERUS FRACTURE;  Surgeon: Jones Broom, MD;  Location: [facility] OR;  Service: Orthopedics;  Laterality: Right;   TONSILLECTOMY      Social History   Socioeconomic History   Marital status: Legally Separated    Spouse name: Not on file   Number of children: Not on file   Years of education: Not on file   Highest education level: Not on file  Occupational History   Not on file  Tobacco Use   Smoking status: Every Day    Packs/day: 1.00    Types: Cigarettes   Smokeless tobacco: Never  Vaping Use   Vaping Use: Never used  Substance and Sexual Activity   Alcohol use: Yes    Alcohol/week: 10.0 standard drinks of alcohol    Types: 10 Shots of liquor per week    Comment: drinks 1/5 daily   Drug use: No   Sexual activity: Not on file  Other Topics Concern   Not on file  Social History Narrative   Not on file   Social Determinants of Health   Financial Resource Strain: Not on file  Food Insecurity: Not on file  Transportation Needs: Not on file  Physical Activity: Not on file  Stress: Not on file  Social Connections: Not on file  Intimate Partner Violence: Not on file   No Known Allergies  Last set of Vital Signs (not current) Vitals:   10/19/21 1936 10/19/21 1955  BP:    Pulse:    Resp:  Temp: 97.8 F (36.6 C)   SpO2:  95%      Physical Exam  Gen: unresponsive Cardiovascular: Good pulses tachycardic Resp: Decreased respiratory effort. Breath sounds equal bilaterally with assisted bag-valve-mask ventilation Abd: nondistended  Neuro: minimally responsive to pain  HEENT: No blood in posterior pharynx, gag reflex absent  Neck: No crepitus  Musculoskeletal: No deformity  Skin: warm  Procedures  INTUBATION Performed by: Vida Roller Required items: required blood products, implants, devices, and special equipment available Patient identity confirmed: provided demographic data and hospital-assigned identification number Time out: Immediately prior to procedure a "time out" was called to verify the correct patient, procedure, equipment, support staff and site/side marked as  required. Indications: Protecting airway, virtually unresponsive Intubation method: Direct laryngoscopy Preoxygenation: BVM Sedatives: 20 mg of etomidate Paralytic: 100 mg of succinylcholine Tube Size: 7.5 cuffed Post-procedure assessment: chest rise and ETCO2 monitor Breath sounds: equal and absent over the epigastrium Tube secured by Respiratory Therapy Patient tolerated the procedure well with no immediate complications.  Medical Decision making  Dr. Thomes Dinning with the hospitalist service present at the bedside to take over care at this point  Assessment and Plan  Asked to assist with intubation in a patient that has been in the ICU for several days gradually worsening and now has decreased mental status and not protecting his airway with a decreased gag reflex and propensity to vomit.  Tachycardic and decreased respiratory effort successfully intubated on first attempt, care turned over to the hospitalist at the time of intubation.  Patient has normal oxygen level as I left the room    Eber Hong, MD 10/19/21 2102

## 2021-10-19 NOTE — Progress Notes (Signed)
Attempted to give report to ICU on patient. ICU does not currently have a bed. Nurse Victorino Dike will give this LPN a call when room is ready.

## 2021-10-19 NOTE — Progress Notes (Signed)
MD notified that upon assessment patient was unresponsive and unable to protect airway himself with copious secretions present. Audible stridor and gurgling heard. Subsequently Adefeso MD ordered for patient to be intubated. ED MD called to bedside to intubate.  20 of etomidate given 2051  100 of succinylcholine given 2052  Patient intubated with a 7.5 ETT @ 2053 positive color changed noted with equal bilateral breath sounds

## 2021-10-19 NOTE — Progress Notes (Addendum)
After pt was given his breathing treatment he was noted to have copious thick secretions in back of throat. Patient has poor cough and minimal gag reflex. Suctioned a moderate amount of thick tan secretions from airway and notified RN. This RT's opinion is that patient has been declining for the past couple days and is now not protecting his airway sufficiently. RN to call MD and plan for intubation.

## 2021-10-19 NOTE — Progress Notes (Signed)
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  He admits to drinking one half of 1/5 of whiskey daily.  He is mostly complaining of pain to his mid and lower back.  His home was noted to be in terrible condition and he was covered and bedbugs and all of his clothing had to be removed and he had to be bathed in the ED.  He was sent for work-up in the ED including CT scans and x-rays and noted to have displaced rib fractures in the posterior left 11th and 12th ribs and mildly displaced left 10th rib fracture and severe hepatic steatosis and left L2 transverse process acute fracture.  He is notably in severe pain and noted to be hyponatremic as well.  Admission was requested for pain management and possible placement.  He has severe problems with frequent falling likely related to alcohol intoxication.   Assessment and Plan: * Uncontrolled pain - Pain management orders for moderate and severe pain, titrate as needed -- still not able to get pain controlled, we have asked patient to wear TLSO brace to assist with severe rib fracture pain   Aspiration pneumonia with acute hypoxic respiratory failure -Start IV Unasyn, bronchodilators, -Supplemental oxygen, -Transfer to stepdown  Delirium tremens----patient with significant confusion, psychosis, tremors, tachycardia, tachypnea--- start aggressive lorazepam CIWA protocol -Transfer to  stepdown   Hypomagnesemia/hyponatremia -- IV replacement given and repleted   Hyponatremia - Likely secondary to dehydration, treating with normal saline infusion -- Treated and resolved now  Macrocytic anemia - serum B12  Is 973, low folic acid level being repleted  Multiple rib fractures - Pain management and supportive care, brace ordered and PT evaluation requested - Fall precautions -- brace ordered  -- he needs more PT to work with him but due to staffing they have not been able to work with him for last couple of days   Bedbug infestation - Reportedly noted to be living in substandard horrible conditions according to [facility] report - He has been thoroughly cleansed and clothing changed out in the emergency department - [facility] consultation with social work for APS referral  Severe hepatic steatosis - Secondary to chronic alcohol abuse - He should follow-up with GI on outpatient basis for ongoing surveillance  Lumbar transverse process fracture - Pain management and ordered TLSO brace for support and pain relief  Failure to thrive in adult - Patient is severely deconditioned and malnourished and living in substandard conditions and we have asked for a physical therapy consultation and TOC social work consultation for possible APS referral and placement in a safe for home environment.  Protein-calorie malnutrition, severe - Consultation to dietitian for treatment recommendations and monitor for refeeding syndrome  Pneumoconiosis, coal, workers' [facility]) - Patient continues smoking cigarettes and we have ordered nicotine patch for him as well as bronchodilators as needed  Elevated MCV - Secondary to chronic alcohol abuse and nutritional deficiencies - Follow-up vitamin B12 and folic acid levels - Resume daily oral vitamin B12 supplement, multivitamin  and folic acid  Thrombocytopenia (HCC) - Secondary to chronic alcohol abuse - Monitoring  Elevated AST (SGOT) - Secondary  to chronic alcohol abuse unchanged from recent testing done in November 2022  Physical deconditioning -  PT recommending SNF placement -- He needs to get out of bed and ambulate more with his TLSO brace.  Ambulate with assistance orders placed.    Generalized weakness - Multifactorial given chronic alcohol abuse, multiple rib fractures and lumbar transverse process fracture - See work-up noted above, treating supportively and working on safe for home placement  Essential hypertension - Resumed home lisinopril 10 mg daily and follow  Falls frequently - This is most likely secondary to chronic alcohol intoxication in addition to substandard living conditions - PT evaluation recommending SNF rehab  - Fall precautions recommended --working on SNF rehab placement   DVT prophylaxis: enoxaparin Code Status: full  Family Communication: none present  Disposition: Status is: Inpatient Remains inpatient appropriate because: IV fluids, electrolyte replacement    Consultants: palliative care, PT   Procedures:   Antimicrobials:    Subjective: Obvious aspiration, shob, coughing +ve Fevers -confusion, disorientation, tremors---full Blown DTs -  Objective: Vitals:   10/19/21 1512 10/19/21 1558 10/19/21 1600 10/19/21 1700  BP: (!) 160/75  (!) 153/85 (!) 146/83  Pulse: (!) 123  (!) 120 (!) 115  Resp: (!) 32  20 (!) 23  Temp: (!) 100.6 F (38.1 C) 99.9 F (37.7 C)    TempSrc:  Oral    SpO2: 97%  97% 94%  Weight:      Height:        Intake/Output Summary (Last 24 hours) at 10/19/2021 1819 Last data filed at 10/19/2021 1300 Gross per 24 hour  Intake 0 ml  Output 600 ml  Net -600 ml   Filed Weights   10/13/21 0621 10/13/21 1347  Weight: 61.2 kg 57.3 kg   Examination:  General exam: cachectic poorly groomed male, awake, alert, cooperative, he is somnolent today but arousable and appropriate when awake, Appears calm and comfortable  Respiratory system: shallow breathing due to  pain.  Large bruise to left anterior chest wall.  Nose- [place] 4L/min Cardiovascular system: normal S1 & S2 heard. No JVD, murmurs, rubs, gallops or clicks. No pedal edema. Gastrointestinal system: Abdomen is nondistended, soft and nontender.  . Normal bowel sounds heard. Central nervous system: significant tremor  extremities: Symmetric 5 x 5 power. Skin: No rashes, lesions or ulcers. Psychiatry: Confused, disoriented, hallucinating Data Reviewed: I have personally reviewed following labs and imaging studies  CBC: Recent Labs  Lab 10/13/21 0642 10/16/21 0605 10/19/21 0548  WBC 5.5 7.0 6.1  NEUTROABS 3.0  --   --   HGB 11.4* 12.1* 10.1*  HCT 32.7* 36.3* 28.6*  MCV 100.6* 103.7* 100.7*  PLT 148* 157 0000000    Basic Metabolic Panel: Recent Labs  Lab 10/13/21 0642 10/14/21 0618 10/15/21 0845 10/16/21 0605 10/17/21 0600 10/19/21 0548  NA 133* 134* 135  --  135 133*  K 3.6 3.4* 3.8  --  3.6 3.9  CL 97* 100 103  --  101 100  CO2 23 26 26   --  28 25  GLUCOSE 87 112* 104*  --  110* 112*  BUN 15 17 10   --  15 15  CREATININE 0.67 0.62 0.55*  --  0.73 0.52*  CALCIUM 8.7* 8.4* 8.6*  --  8.3* 8.7*  MG  --  1.5* 1.8 2.1  --  1.9  PHOS  --   --   --  3.1  --   --     CBG: Recent Labs  Lab 10/16/21 0734  GLUCAP 116*    Recent Results (from the past 240 hour(s))  SARS Coronavirus 2 by RT PCR (hospital order, performed in [facility] hospital lab) *cepheid single result test* Anterior Nasal Swab     Status: None   Collection Time: 10/13/21  7:13 AM   Specimen: Anterior Nasal Swab  Result Value Ref Range Status   SARS Coronavirus 2 by RT PCR NEGATIVE NEGATIVE Final    Comment: (NOTE) SARS-CoV-2 target nucleic acids are NOT DETECTED.  The SARS-CoV-2 RNA is generally detectable in upper and lower respiratory specimens during the acute phase of infection. The lowest concentration of SARS-CoV-2 viral copies this assay can detect is 250 copies / mL. A negative result does not  preclude SARS-CoV-2 infection and should not be used as the sole basis for treatment or other patient management decisions.  A negative result may occur with improper specimen collection / handling, submission of specimen other than nasopharyngeal swab, presence of viral mutation(s) within the areas targeted by this assay, and inadequate number of viral copies (<250 copies / mL). A negative result must be combined with clinical observations, patient history, and epidemiological information.  Fact Sheet for Patients:   https://www.patel.info/  Fact Sheet for Healthcare Providers: https://hall.com/  This test is not yet approved or  cleared by the [place] FDA and has been authorized for detection and/or diagnosis of SARS-CoV-2 by FDA under an Emergency Use Authorization (EUA).  This EUA will remain in effect (meaning this test can be used) for the duration of the COVID-19 declaration under Section 564(b)(1) of the Act, 21 U.S.C. section 360bbb-3(b)(1), unless the authorization is terminated or revoked sooner.  Performed at [facility], [address] [place], [place] [address]      Radiology Studies: DG CHEST PORT 1 VIEW  Result Date: 10/19/2021 CLINICAL DATA:  Pain and weakness. EXAM: PORTABLE CHEST 1 VIEW COMPARISON:  10/16/2021. FINDINGS: 5:03 a.m., 10/19/2021.  The patient is rotated to the left. Innumerable calcified lung granulomas are again noted and scattered calcified pleural plaquing. The lungs are emphysematous. Patchy hazy increased opacity is noted today in the right lung base and increased denser consolidation/atelectasis in the left lower lobe with development of small bilateral pleural effusions. No other new lung opacity is seen. The cardiac size is normal. There is aortic atherosclerosis with stable mediastinum. Osteopenia. 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 IMPRESSION: Increased opacities in the left-greater-than-right lung bases and small pleural effusions. Findings concerning for pneumonia or aspiration. Background chronic change described above. Electronically Signed   By: Telford Nab M.D.   On: 10/19/2021 07:11    Scheduled Meds:  Chlorhexidine Gluconate Cloth  6 each Topical Daily   cholecalciferol  1,000 Units Oral Daily   enoxaparin (LOVENOX) injection  40 mg Subcutaneous Q24H   feeding supplement  237 mL Oral BID BM   ipratropium-albuterol  3 mL Nebulization Q6H   lisinopril  5 mg Oral Daily   LORazepam  0-4 mg Intravenous Q6H   Followed by   Derrill Memo ON 10/21/2021] LORazepam  0-4 mg Intravenous Q12H   nicotine  21 mg Transdermal Daily   Continuous Infusions:  ampicillin-sulbactam (UNASYN) IV 3 g (10/19/21 1353)   dextrose 5 % and 0.9% NaCl 1,000 mL with M.V.I. Adult (INFUVITE ADULT) 10 mL infusion 100 mL/hr at 10/19/21 1430   [START ON  10/20/2021] dextrose 5 % and 0.9% NaCl      LOS: 6 days   Shon Hale, MD How to contact the [facility] Attending or Consulting provider 7A - 7P or covering provider during after hours 7P -7A, for this patient?  Check the care team in [facility] and look for a) attending/consulting [facility] provider listed and b) the [facility] team listed Log into [email or web address] and use [place]'s universal password to access. If you do not have the password, please contact the hospital operator. Locate the [facility] provider you are looking for under [facility] and page to a number that you can be directly reached. If you still have difficulty reaching the provider, please page the [facility] (Director on Call) for the Hospitalists listed on amion for assistance.  10/19/2021, 6:19 PM

## 2021-10-19 NOTE — Progress Notes (Signed)
Walked in patient's room and noticed tremors. Patient scored a 20 on CIWA. No orders were in for Ativan. MD Emokpae notified. MD assessed patient and put in orders. Check orders. Patient placed on continuous pulse ox and tele. Administered 2 mg of Ativan per order.

## 2021-10-19 NOTE — Progress Notes (Signed)
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  He admits to drinking one half of 1/5 of whiskey daily.  He is mostly complaining of pain to his mid and lower back.  His home was noted to be in terrible condition and he was covered and bedbugs and all of his clothing had to be removed and he had to be bathed in the ED.  He was sent for work-up in the ED including CT scans and x-rays and noted to have displaced rib fractures in the posterior left 11th and 12th ribs and mildly displaced left 10th rib fracture and severe hepatic steatosis and left L2 transverse process acute fracture.  He is notably in severe pain and noted to be hyponatremic as well.  Admission was requested for pain management and possible placement.  He has severe problems with frequent falling likely related to alcohol intoxication    PT Comments    Patient presents slightly lethargic, apprehensive to get up and required Max verbal/tactile cueing to participate with therapy.  Patient demonstrates slow labored movement for rolling to side and sitting up from side lying position requiring Mod assist and HOB partially raised, very unsteady on feet and limited to a few side steps before having to sit due to buckling of knees with near loss of balance.  Patient tolerated sitting up in chair after therapy.  Patient will benefit from continued skilled physical therapy in hospital and recommended venue below to increase strength, balance, endurance for safe ADLs and  gait.     Recommendations for follow up therapy are one component of a multi-disciplinary discharge planning process, led by the attending physician.  Recommendations may be updated based on patient status, additional functional criteria and insurance authorization.  Follow Up Recommendations  Skilled nursing-short term rehab (<3 hours/day)     Assistance Recommended at Discharge Intermittent Supervision/Assistance  Patient can return home with the following A lot of help with bathing/dressing/bathroom;A lot of help with walking and/or transfers;Help with stairs or ramp for entrance;Assistance with cooking/housework   Equipment Recommendations  None recommended by PT    Recommendations for Other Services       Precautions / Restrictions Precautions Precautions: Fall Restrictions Weight Bearing Restrictions: No     Mobility  Bed Mobility Overal bed mobility: Needs Assistance Bed Mobility: Rolling, Sidelying to Sit Rolling: Mod assist, Max assist Sidelying to sit: HOB elevated, Mod assist, Max assist       General bed mobility comments: slow labored movement with fair/good return for holding onto bed rail during side lying to sitting    Transfers Overall transfer level: Needs assistance Equipment used: Rolling walker (2 wheels) Transfers: Sit to/from Stand, Bed to chair/wheelchair/BSC Sit to Stand: Mod assist   Step pivot transfers: Mod assist       General transfer comment: shaky unsteady movement with buckling of knees    Ambulation/Gait Ambulation/Gait assistance: Mod assist, Max assist Gait Distance (Feet): 5 Feet Assistive device: Rolling walker (2 wheels) Gait Pattern/deviations: Decreased step length - right, Decreased step length - left, Decreased stride length, Trunk flexed Gait velocity:  decreased     General Gait Details: limited to a few slow labored unsteady side steps with buckling of knees and poor standing balance   Stairs              Wheelchair Mobility    Modified Rankin (Stroke Patients Only)       Balance Overall balance assessment: Needs assistance Sitting-balance support: Feet supported, No upper extremity supported Sitting balance-Leahy Scale: Fair Sitting balance - Comments: fair/poor seated at EOB with anterior leaning   Standing balance support: Reliant on assistive device for balance, During functional activity, Bilateral upper extremity supported Standing balance-Leahy Scale: Poor Standing balance comment: fair/poor using RW                            Cognition Arousal/Alertness: Awake/alert, Lethargic Behavior During Therapy: WFL for tasks assessed/performed Overall Cognitive Status: Within Functional Limits for tasks assessed                                 General Comments: apprehensive secondary to c/o fatigue, dizziness        Exercises General Exercises - Lower Extremity Long Arc Quad: Seated, AAROM, Strengthening, Both, 10 reps Hip Flexion/Marching: Seated, AAROM, Strengthening, Both, 10 reps Toe Raises: Seated, AROM, Strengthening, 5 reps, Both Heel Raises: Seated, AROM, Strengthening, Both, 10 reps    General Comments        Pertinent Vitals/Pain Pain Assessment Pain Assessment: Faces Faces Pain Scale: Hurts little more Pain Location: back Pain Descriptors / Indicators: Sore, Grimacing Pain Intervention(s): Limited activity within patient's tolerance, Monitored during session, Repositioned    Home Living                          Prior Function            PT Goals (current goals can now be found in the care plan section) Acute Rehab PT Goals Patient Stated Goal: return home PT Goal Formulation: With patient Time For Goal Achievement: 10/27/21 Potential to Achieve Goals: Good Progress towards PT goals: Progressing toward goals    Frequency    Min 3X/week      PT Plan Current plan remains appropriate     Co-evaluation              AM-PAC PT "6 Clicks" Mobility   Outcome Measure  Help needed turning from your back to your side while in a flat bed without using bedrails?: A Lot Help needed moving from lying on your back to sitting on the side of a flat bed without using bedrails?: A Lot Help needed moving to and from a bed to a chair (including a wheelchair)?: A Lot Help needed standing up from a chair using your arms (e.g., wheelchair or bedside chair)?: A Lot Help needed to walk in hospital room?: A Lot Help needed climbing 3-5 steps with a railing? : Total 6 Click Score: 11    End of Session   Activity Tolerance: Patient tolerated treatment well;Patient limited by fatigue Patient left: in chair;with call bell/phone within reach Nurse Communication: Mobility status PT Visit Diagnosis: Unsteadiness on feet (R26.81);Other abnormalities of gait and mobility (R26.89);Muscle weakness (generalized) (M62.81);History of falling (Z91.81)     Time: 1062-6948 PT Time Calculation (min) (ACUTE ONLY): 21 min  Charges:  $Therapeutic Exercise: 8-22 mins $Therapeutic Activity: 8-22 mins  1:54 PM, 10/19/21 Lonell Grandchild, MPT Physical Therapist with [facility] [phone] office [phone] mobile phone

## 2021-10-19 NOTE — Progress Notes (Signed)
Critical care: RN called due to patient being more somnolent and unresponsive to pain and tactile stimuli. There was concern for ongoing aspiration. At bedside, he was lethargic.  He was arousable but unable to answer questions, he was noted to be drooling.  Chest x-ray done this morning showed increased opacities in the left-greater-than-right lung bases and small pleural effusions with findings concerning for pneumonia or aspiration.  Decision was made to intubate patient.    BP (!) 166/56   Pulse (!) 115   Temp 97.8 F (36.6 C) (Oral)   Resp (!) 29   Ht 6\' 2"  (1.88 m)   Wt 57.3 kg   SpO2 95%   BMI 16.22 kg/m    Physical Exam  Gen:-Lethargic, ill-appearing, drooling and unable to protect airway HEENT:- .AT, No sclera icterus Neck-Supple Neck,No JVD,.  Lungs-decreased breath sounds with rales bilaterally in lower lobes  CV-tachycardia.  S1, S2 normal Abd-  +ve B.Sounds, Abd Soft, No tenderness,    Extremity/Skin:- No  edema,   warm and dry Psych-decreased mental status Neuro- no tremors, decreased response to painful and tactile stimuli   ED physician (Dr. ) was consulted, patient was intubated and a left EJ was placed.  OG was placed. Postintubation Chest x-ray showed appliances appear in satisfactory position. Otherwise no change since previous study today.  Assessment and plan Altered mental status in the setting of aspiration pneumonia with acute hypoxic respiratory failure requiring IPPV Patient was intubated due to inability to protect airway Patient was started on Versed drip without much sedation and propofol was added for improved sedation.  Levophed was started due to decreased BP in the setting of propofol drip.  Goal is MAP of 65 Critical care will be consulted and we shall await further recommendation. Please refer to the details regarding admission history, assessment and plan in day physician's medical record.   Critical time: 57 minutes   Critical  care personally provided  managing the patient due to high probability of clinically significant and life threatening deterioration. This critical care time included obtaining a history; examining the patient, pulse oximetry; ordering and review of studies; arranging urgent treatment with development of a management plan; evaluation of patient's response of treatment; frequent reassessment; and discussions with other providers.  This critical care time was performed to assess and manage the high probability of imminent and life threatening deterioration that could result in multi-organ failure.

## 2021-10-19 NOTE — Progress Notes (Signed)
eLink Physician-Brief Progress Note Patient Name: Larry [name] DOB: 28-Jan-1951 MRN: [id number]   Date of Service  10/19/2021  HPI/Events of Note  Patient admitted for ETOH withdrawal, and subsequently intubated for altered mental status, aspiration, and acute respiratory failure, following intubation he was hypotensive, requiring a fluid bolus and Norepinephrine gtt.  eICU Interventions  New Patient Evaluation. Patient switched to Propofol + Fentanyl gtt, with PRN Versed boluses for agitation, Levophed switched to peripheral gtt protocol.        Thomasene Lot Eternity Dexter 10/19/2021, 10:22 PM

## 2021-10-19 NOTE — Progress Notes (Addendum)
Pharmacy Antibiotic Note  DAKWAN PRIDGEN a 71 y.o. male admitted on 10/19/2021 with aspiration pneumonia.  Pharmacy has been consulted for unasyn dosing.  Plan: Unasyn 3gm iv q6h  Medical History: 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   Hypertension    Shingles    Shortness of breath dyspnea    SOB with exertion   Thyroid disease    hypothyroidism    Allergies:  No Known Allergies  Filed Weights   10/13/21 0621 10/13/21 1347  Weight: 61.2 kg (134 lb 14.7 oz) 57.3 kg (126 lb 5.2 oz)       Latest Ref Rng & Units 10/19/2021    5:48 AM 10/16/2021    6:05 AM 10/13/2021    6:42 AM  CBC  WBC 4.0 - 10.5 K/uL 6.1  7.0  5.5   Hemoglobin 13.0 - 17.0 g/dL 95.6  38.7  56.4   Hematocrit 39.0 - 52.0 % 28.6  36.3  32.7   Platelets 150 - 400 K/uL 278  157  148      Estimated Creatinine Clearance: 69.6 mL/min (A) (by C-G formula based on SCr of 0.52 mg/dL (L)).  Antibiotics Given (last 72 hours)     None       Antimicrobials this admission:  unasyn 10/19/2021  >>   Microbiology results: 10/19/2021 Resp Panel: negative   Thank you for allowing pharmacy to be a part of this patient's care.  Luan Pulling, PharmD Clinical Pharmacist

## 2021-10-19 NOTE — Progress Notes (Signed)
This LPN scored patient a 24 on CIWA scale. MD Emokpae notified. MD Emokpae ordered to give patient 4 mg of ativan. 4 mg of ativan given.

## 2021-10-19 NOTE — TOC Progression Note (Signed)
Transition of Care [facility]) - Progression Note    Patient Details  Name: Larry [name] MRN: [id number] Date of Birth: 10-Apr-1951  [facility] CM/SW Contact  Annice Needy, LCSW Phone Number: 10/19/2021, 4:11 PM  Clinical Narrative:    Report made to [facility] APS.    Expected Discharge Plan: Skilled Nursing Facility Barriers to Discharge: Continued Medical Work up  Expected Discharge Plan and Services Expected Discharge Plan: Skilled Nursing Facility     Post Acute Care Choice: Skilled Nursing Facility Living arrangements for the past 2 months: Single Family Home                                       Social Determinants of Health (SDOH) Interventions    Readmission Risk Interventions    03/10/2021    2:14 PM 02/11/2021    1:11 PM  Readmission Risk Prevention Plan  Medication Screening Complete   Transportation Screening Complete Complete  Home Care Screening  Complete  Medication Review (RN CM)  Complete

## 2021-10-19 NOTE — Progress Notes (Addendum)
Report was given to ICU nurse Sarah.

## 2021-10-19 NOTE — Progress Notes (Signed)
Assessed patient to see if he was alert. Patient answered all questions appropriately. Night shift RN stated she held PO meds due to swallowing difficulties. This LPN attempted thickened ensure to see if patient could take oral meds. Patient started coughing. This LPN held PO meds and informed MD Emokpae.

## 2021-10-20 ENCOUNTER — Inpatient Hospital Stay (HOSPITAL_COMMUNITY): Payer: Medicare HMO

## 2021-10-20 DIAGNOSIS — R52 Pain, unspecified: Secondary | ICD-10-CM | POA: Diagnosis not present

## 2021-10-20 DIAGNOSIS — Z7189 Other specified counseling: Secondary | ICD-10-CM | POA: Diagnosis not present

## 2021-10-20 DIAGNOSIS — R627 Adult failure to thrive: Secondary | ICD-10-CM | POA: Diagnosis not present

## 2021-10-20 DIAGNOSIS — T17908A Unspecified foreign body in respiratory tract, part unspecified causing other injury, initial encounter: Secondary | ICD-10-CM | POA: Diagnosis not present

## 2021-10-20 DIAGNOSIS — J9601 Acute respiratory failure with hypoxia: Secondary | ICD-10-CM | POA: Diagnosis not present

## 2021-10-20 LAB — COMPREHENSIVE METABOLIC PANEL
ALT: 23 U/L (ref 0–44)
AST: 21 U/L (ref 15–41)
Albumin: 2.4 g/dL — ABNORMAL LOW (ref 3.5–5.0)
Alkaline Phosphatase: 87 U/L (ref 38–126)
Anion gap: 4 — ABNORMAL LOW (ref 5–15)
BUN: 16 mg/dL (ref 8–23)
CO2: 27 mmol/L (ref 22–32)
Calcium: 8.8 mg/dL — ABNORMAL LOW (ref 8.9–10.3)
Chloride: 107 mmol/L (ref 98–111)
Creatinine, Ser: 0.52 mg/dL — ABNORMAL LOW (ref 0.61–1.24)
GFR, Estimated: >60 mL/min (ref >60)
Glucose, Bld: 136 mg/dL — ABNORMAL HIGH (ref 70–99)
Potassium: 3.3 mmol/L — ABNORMAL LOW (ref 3.5–5.1)
Sodium: 138 mmol/L (ref 135–145)
Total Bilirubin: 0.5 mg/dL (ref 0.3–1.2)
Total Protein: 5.8 g/dL — ABNORMAL LOW (ref 6.5–8.1)

## 2021-10-20 LAB — GLUCOSE, CAPILLARY
Glucose-Capillary: 134 mg/dL — ABNORMAL HIGH (ref 70–99)
Glucose-Capillary: 128 mg/dL — ABNORMAL HIGH (ref 70–99)
Glucose-Capillary: 116 mg/dL — ABNORMAL HIGH (ref 70–99)
Glucose-Capillary: 126 mg/dL — ABNORMAL HIGH (ref 70–99)
Glucose-Capillary: 132 mg/dL — ABNORMAL HIGH (ref 70–99)
Glucose-Capillary: 116 mg/dL — ABNORMAL HIGH (ref 70–99)

## 2021-10-20 LAB — BLOOD GAS, ARTERIAL
Acid-Base Excess: 5 mmol/L — ABNORMAL HIGH (ref 0.0–2.0)
Acid-Base Excess: 5.7 mmol/L — ABNORMAL HIGH (ref 0.0–2.0)
Bicarbonate: 28.6 mmol/L — ABNORMAL HIGH (ref 20.0–28.0)
Bicarbonate: 29.9 mmol/L — ABNORMAL HIGH (ref 20.0–28.0)
Drawn by: 38235
Drawn by: 38235
FIO2: 40 %
FIO2: 60 %
O2 Saturation: 100 %
O2 Saturation: 100 %
Patient temperature: 37
Patient temperature: 37
pCO2 arterial: 35 mmHg (ref 32–48)
pCO2 arterial: 44 mmHg (ref 32–48)
pH, Arterial: 7.44 (ref 7.35–7.45)
pH, Arterial: 7.52 — ABNORMAL HIGH (ref 7.35–7.45)
pO2, Arterial: 123 mmHg — ABNORMAL HIGH (ref 83–108)
pO2, Arterial: 90 mmHg (ref 83–108)

## 2021-10-20 LAB — CBC
HCT: 31.2 % — ABNORMAL LOW (ref 39.0–52.0)
Hemoglobin: 10.2 g/dL — ABNORMAL LOW (ref 13.0–17.0)
MCH: 34.2 pg — ABNORMAL HIGH (ref 26.0–34.0)
MCHC: 32.7 g/dL (ref 30.0–36.0)
MCV: 104.7 fL — ABNORMAL HIGH (ref 80.0–100.0)
Platelets: 298 10*3/uL (ref 150–400)
RBC: 2.98 MIL/uL — ABNORMAL LOW (ref 4.22–5.81)
RDW: 13.8 % (ref 11.5–15.5)
WBC: 7.5 10*3/uL (ref 4.0–10.5)
nRBC: 0 % (ref 0.0–0.2)

## 2021-10-20 LAB — TRIGLYCERIDES: Triglycerides: 81 mg/dL (ref <150)

## 2021-10-20 MED ORDER — LISINOPRIL 5 MG PO TABS: 5.0000 mg | ORAL_TABLET | Freq: Every day | ORAL | Status: DC

## 2021-10-20 MED ORDER — PANTOPRAZOLE SODIUM 40 MG IV SOLR
40.0000 mg | Freq: Every day | INTRAVENOUS | Status: DC
  Administered 2021-10-20 – 2021-10-21 (×2): 40 mg via INTRAVENOUS
  Filled 2021-10-20 (×2): qty 10

## 2021-10-20 MED ORDER — VITAMIN D 25 MCG (1000 UNIT) PO TABS
1000.0000 [IU] | ORAL_TABLET | Freq: Every day | ORAL | Status: DC
  Administered 2021-10-20 – 2021-10-27 (×8): 1000 [IU]
  Filled 2021-10-20 (×8): qty 1

## 2021-10-20 MED ORDER — OSMOLITE 1.2 CAL PO LIQD
1000.0000 mL | ORAL | Status: DC
  Administered 2021-10-20 – 2021-10-23 (×4): 1000 mL
  Administered 2021-10-25: 50 mL
  Administered 2021-10-26: 1000 mL
  Filled 2021-10-20: qty 1000

## 2021-10-20 MED ORDER — ORAL CARE MOUTH RINSE
15.0000 mL | OROMUCOSAL | Status: DC
Start: 2021-10-20 — End: 2021-10-28
  Administered 2021-10-20 – 2021-10-28 (×93): 15 mL via OROMUCOSAL

## 2021-10-20 MED ORDER — FENTANYL CITRATE PF 50 MCG/ML IJ SOSY
25.0000 ug | PREFILLED_SYRINGE | INTRAMUSCULAR | Status: DC | PRN
  Administered 2021-10-25: 25 ug via INTRAVENOUS
  Administered 2021-10-27: 100 ug via INTRAVENOUS
  Administered 2021-10-27: 50 ug via INTRAVENOUS
  Filled 2021-10-20: qty 2
  Filled 2021-10-20 (×2): qty 1

## 2021-10-20 MED ORDER — ORAL CARE MOUTH RINSE: 15.0000 mL | OROMUCOSAL | Status: DC | PRN

## 2021-10-20 MED ORDER — PROSOURCE TF PO LIQD
45.0000 mL | Freq: Three times a day (TID) | ORAL | Status: DC
Start: 2021-10-20 — End: 2021-10-27
  Administered 2021-10-20 – 2021-10-27 (×21): 45 mL
  Filled 2021-10-20 (×21): qty 45

## 2021-10-20 MED ORDER — POTASSIUM CHLORIDE 10 MEQ/100ML IV SOLN
10.0000 meq | INTRAVENOUS | Status: AC
  Administered 2021-10-20 (×4): 10 meq via INTRAVENOUS
  Filled 2021-10-20 (×4): qty 100

## 2021-10-20 MED ORDER — POTASSIUM CHLORIDE 10 MEQ/100ML IV SOLN
10.0000 meq | INTRAVENOUS | Status: AC
  Administered 2021-10-20 (×3): 10 meq via INTRAVENOUS
  Filled 2021-10-20 (×3): qty 100

## 2021-10-20 NOTE — Progress Notes (Signed)
Propofol restarted at 1731 at . See MAR for details. Patient was coughing and was having generalized tremors. Dr Vassie Loll and Dr. Mariea Clonts notified.

## 2021-10-20 NOTE — Progress Notes (Signed)
PT Cancellation Note  Patient Details Name: Larry [name] MRN: [id number] DOB: 1950-06-04   Cancelled Treatment:    Reason Eval/Treat Not Completed: Medical issues which prohibited therapy.  Patient transferred to a higher level of care and will need new PT consult to resume therapy when patient is medically stable.  Thank you.   7:37 AM, 10/20/21 Ocie Bob, MPT Physical Therapist with [facility] 336 [phone] office [phone] mobile phone

## 2021-10-20 NOTE — Consult Note (Signed)
NAME:  Larry [name], MRN:  [id number], DOB:  08-27-1950, LOS: 7 ADMISSION DATE:  10/13/2021, CONSULTATION DATE:  10/20/2021  REFERRING MD:  Marisa Severin, TRH , CHIEF COMPLAINT: Respiratory failure  History of Present Illness:  71 year old EtOH user, smoker BIBEMS on 6/22 for generalized weakness and multiple falls over the past several months.  He had pain in his mid to lower back .  His home was in a terrible condition, there were bedbugs.  He was severely malnourished initial imaging showed displaced rib fracture posterior left 10th, 11th and 12th ribs and left T2 transverse process fracture. Initial labs were significant for hyponatremia. Social work and APS referrals were made On 6/28 he was noted to be confused, disoriented with tremors consistent with DTs.  CIWA protocol was started and he was transferred to stepdown.  He was noted to be febrile, coughing and dyspnea consistent with aspiration.  He required intubation and mechanical ventilation, hypotensive requiring fluid bolus and Levophed drip  Pertinent  Medical History  EtOH abuse Hypertension Pneumoconiosis -coalminer's disease  Significant Hospital Events: Including procedures, antibiotic start and stop dates in addition to other pertinent events     Interim History / Subjective:  Defervesced Intubated, sedated with propofol On low-dose Levophed drip  Objective   Blood pressure (!) 104/58, pulse 96, temperature 97.6 F (36.4 C), temperature source Axillary, resp. rate 18, height  (1.88 m), weight 61.1 kg, SpO2 97 %.    Vent Mode: PRVC FiO2 (%):  [40 %-60 %] 40 % Set Rate:  [18 bmp-24 bmp] 18 bmp Vt Set:  [640 mL] 640 mL PEEP:  [5 cmH20] 5 cmH20 Plateau Pressure:  [13 cmH20-17 cmH20] 16 cmH20   Intake/Output Summary (Last 24 hours) at 10/20/2021 1341 Last data filed at 10/20/2021 1300 Gross per 24 hour  Intake 3158.47 ml  Output --  Net 3158.47 ml   Filed Weights   10/13/21 0621 10/13/21 1347 10/20/21 0513   Weight: 61.2 kg 57.3 kg 61.1 kg    Examination: General: Chronically ill-appearing man, intubated and sedated HENT: Mild pallor, no icterus Lungs: Bilateral air entry present, no accessory muscle use Cardiovascular: S1-S2 regular, no murmur Abdomen: Soft, nontender, no hepatosplenomegaly Extremities: No deformity, no edema Neuro: Sedated, RASS -3, on propofol and fentanyl drips    Labs show normal sodium, mild hypokalemia, albumin 2.4, no leukocytosis, stable anemia  Chest x-ray shows hyperinflation, extensive granulomatous disease which is chronic and new bibasilar infiltrates, ET tube in position Resolved Hospital Problem list   Hyponatremia  Assessment & Plan:  Acute hypoxic respiratory failure Aspiration pneumonia   -Vent settings reviewed and adjusted -Spontaneous breathing trials , hopeful to work towards extubation -Continue Unasyn for now, obtain respiratory culture  Left-sided rib fractures, vertebral fracture  acute pain -Consider nonnarcotics  Acute metabolic encephalopathy Delirium tremens  -Using propofol and switch to intermittent fentanyl with goal RASS 0 to -1 -Consider switching to Precedex -Versed as needed for breakthrough agitation  Hypokalemia and hypomagnesemia being repleted  Best Practice (right click and "Reselect all SmartList Selections" daily)   Diet/type: tubefeeds DVT prophylaxis: LMWH GI prophylaxis: PPI Lines: N/A Foley:  N/A Code Status:  full code Last date of multidisciplinary goals of care discussion [palliative care involved, no HCPOA, full code by default, TCC team working on APS referral/DSS guardianship]  Labs   CBC: Recent Labs  Lab 10/16/21 0605 10/19/21 0548 10/20/21 0320  WBC 7.0 6.1 7.5  HGB 12.1* 10.1* 10.2*  HCT 36.3* 28.6* 31.2*  MCV  103.7* 100.7* 104.7*  PLT 157 278 298    Basic Metabolic Panel: Recent Labs  Lab 10/14/21 0618 10/15/21 0845 10/16/21 0605 10/17/21 0600 10/19/21 0548  10/20/21 0320  NA 134* 135  --  135 133* 138  K 3.4* 3.8  --  3.6 3.9 3.3*  CL 100 103  --  101 100 107  CO2 26 26  --  28 25 27   GLUCOSE 112* 104*  --  110* 112* 136*  BUN 17 10  --  15 15 16   CREATININE 0.62 0.55*  --  0.73 0.52* 0.52*  CALCIUM 8.4* 8.6*  --  8.3* 8.7* 8.8*  MG 1.5* 1.8 2.1  --  1.9  --   PHOS  --   --  3.1  --   --   --    GFR: Estimated Creatinine Clearance: 74.3 mL/min (A) (by C-G formula based on SCr of 0.52 mg/dL (L)). Recent Labs  Lab 10/16/21 0605 10/19/21 0548 10/20/21 0320  WBC 7.0 6.1 7.5    Liver Function Tests: Recent Labs  Lab 10/14/21 0618 10/20/21 0320  AST 39 21  ALT 29 23  ALKPHOS 77 87  BILITOT 0.9 0.5  PROT 5.6* 5.8*  ALBUMIN 3.0* 2.4*   No results for input(s): "LIPASE", "AMYLASE" in the last 168 hours. No results for input(s): "AMMONIA" in the last 168 hours.  ABG    Component Value Date/Time   PHART 7.44 10/20/2021 0520   PCO2ART 44 10/20/2021 0520   PO2ART 90 10/20/2021 0520   HCO3 29.9 (H) 10/20/2021 0520   TCO2 23 02/28/2018 1643   O2SAT 100 10/20/2021 0520     Coagulation Profile: No results for input(s): "INR", "PROTIME" in the last 168 hours.  Cardiac Enzymes: No results for input(s): "CKTOTAL", "CKMB", "CKMBINDEX", "TROPONINI" in the last 168 hours.  HbA1C: No results found for: "HGBA1C"  CBG: Recent Labs  Lab 10/16/21 0734 10/20/21 0642 10/20/21 0737 10/20/21 1142  GLUCAP 116* 134* 128* 116*    Review of Systems:   Unable to obtain since intubated and sedated  Past Medical History:  He,  has a past medical history of Alcohol abuse, Arthritis, COPD (chronic obstructive pulmonary disease) (HCC), Hypertension, Shingles, Shortness of breath dyspnea, and Thyroid disease.   Surgical History:   Past Surgical History:  Procedure Laterality Date   CATARACT EXTRACTION W/ INTRAOCULAR LENS  IMPLANT, BILATERAL     KNEE SURGERY     ORIF HUMERUS FRACTURE Right 09/24/2014   Procedure: OPEN REDUCTION  INTERNAL FIXATION (ORIF) PROXIMAL HUMERUS FRACTURE;  Surgeon: 10/22/21, MD;  Location: [facility] OR;  Service: Orthopedics;  Laterality: Right;   TONSILLECTOMY       Social History:   reports that he has been smoking cigarettes. He has been smoking an average of 1 pack per day. He has never used smokeless tobacco. He reports current alcohol use of about 10.0 standard drinks of alcohol per week. He reports that he does not use drugs.   Family History:  His family history includes Diabetes in an other family member; Thyroid disease in his mother.   Allergies No Known Allergies   Home Medications  Prior to Admission medications   Medication Sig Start Date End Date Taking? Authorizing Provider  cholecalciferol (VITAMIN D3) 25 MCG (1000 UNIT) tablet Take 1,000 Units by mouth daily.   Yes [provider]  cyanocobalamin 1000 MCG tablet Take 1 tablet (1,000 mcg total) by mouth daily. 03/15/21  Yes Jones Broom, MD  lisinopril (  ZESTRIL) 10 MG tablet Take 10 mg by mouth daily. 08/27/21  Yes [provider]  thiamine 100 MG tablet Take 1 tablet (100 mg total) by mouth daily. 03/15/21  Yes Shon Hale, MD  folic acid (FOLVITE) 1 MG tablet Take 1 tablet (1 mg total) by mouth daily. Patient not taking: Reported on 10/13/2021 03/16/21   Shon Hale, MD  metoprolol tartrate (LOPRESSOR) 25 MG tablet Take 0.5 tablets (12.5 mg total) by mouth 2 (two) times daily. Patient not taking: Reported on 10/13/2021 03/15/21   Shon Hale, MD  Multiple Vitamin (MULTIVITAMIN WITH MINERALS) TABS tablet Take 1 tablet by mouth daily. Patient not taking: Reported on 10/13/2021 03/16/21   Shon Hale, MD  senna-docusate (SENOKOT-S) 8.6-50 MG tablet Take 2 tablets by mouth at bedtime. Patient not taking: Reported on 10/13/2021 03/15/21   Shon Hale, MD  traZODone (DESYREL) 50 MG tablet Take 0.5 tablets (25 mg total) by mouth at bedtime. Patient not taking: Reported on 10/13/2021  03/15/21   Shon Hale, MD     Critical care time: 45 minutes       Cyril Mourning MD. [facility].  Pulmonary & Critical care Pager : [phone]  If no response to pager , please call [phone] until 7 pm After 7:00 pm call Elink  [phone]   10/20/2021

## 2021-10-20 NOTE — Progress Notes (Signed)
This AM on sedation with propofol and fentanyl patient resistive with nursing care and positioning. Grimacing and coughing. Dr Marisa Severin verbal order to not perform wake trial today. This afternoon per Dr. Vassie Loll verbal order, sedation was turned down for wake trial.Pt did not open eyes to voice or follow commands but continues to cough and grimace. Currently all sedation is off. Patient is not opening eyes to voice or pain but does cough and grimace with nursing care.

## 2021-10-20 NOTE — Progress Notes (Addendum)
Palliative:   [name] is lying quietly in bed.  He has had acute respiratory failure and was intubated yesterday.  He appears acutely/chronically ill and quite frail.  Per his neighbor/friend/caregiver, Larry [name], Larry [name] has experienced a functional decline over the last few months, unable to complete his ADLs without assistance.  He has no emergency contact, and this NP has been able to reach next of kin.  His friend, Larry [name], is also unable to provide contact information.  He was found in dire circumstances and would benefit from APS/DSS guardianship.  Conference with attending, pulmonologist, bedside nursing staff, transition of care team related to patient condition, needs, goals of care, disposition.  Addendum: Per nursing staff, phone call from spouse, Larry [name] yesterday.  [phone].  Left voicemail message requesting return call.  Second call to Larry [name], no message left.  Third attempt to reach Larry [name], left voicemail message asking verification about to married or divorced and if she could provide contact information for Larry [name]'s sons who live in [place].  Plan:   At this point continue full scope/full code by default.  Transition of care team is working closely for APS referral/DSS guardianship.  Would clearly benefit from comfort and dignity at end-of-life, "let nature take its course" based on chronic illness burden, decreased functional status over the last 1 to 2 months, poor nutrition.       50 minutes  [name], NP Palliative medicine team Team phone [phone] Greater than 50% of this time was spent counseling and coordinating care related to the above assessment and plan.

## 2021-10-20 NOTE — Progress Notes (Signed)
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  He admits to drinking one half of 1/5 of whiskey daily.  He is mostly complaining of pain to his mid and lower back.  His home was noted to be in terrible condition and he was covered and bedbugs and all of his clothing had to be removed and he had to be bathed in the ED.  He was sent for work-up in the ED including CT scans and x-rays and noted to have displaced rib fractures in the posterior left 11th and 12th ribs and mildly displaced left 10th rib fracture and severe hepatic steatosis and left L2 transverse process acute fracture.  He is notably in severe pain and noted to be hyponatremic as well.  Admission was requested for pain management and possible placement.  He has severe problems with frequent falling likely related to alcohol intoxication.   Assessment and Plan: 1) acute hypoxic respiratory failure--- in the setting of aspiration pneumonia and full-blown delirium tremens -Intubated 10/19/2021 -Did not do well with sedation vacation today -Pulmonary consult appreciated -Continue propofol and fentanyl  2)Uncontrolled pain/multiple rib fractures/Lumbar transverse process fracture - TLSO brace to assist with severe rib fracture pain when he comes off vent -Opiates as above  3)Aspiration pneumonia with acute hypoxic respiratory failure -In the setting of alcohol abuse/delirium tremens  -Continue IV Unasyn (started  on 10/19/2021) -c/n  bronchodilators, -Currently intubated and sedated  4)Delirium tremens----currently intubated and sedated -May transition to Precedex once extubated  5)Hypomagnesemia/hyponatremia -- Hydrate and replace lites  6)Macrocytic anemia - serum B12  Is 973, low folic acid level being repleted  7)Bedbug infestation - Reportedly noted to be living in substandard horrible conditions according to [facility] report - He has been thoroughly cleansed and clothing changed out in the emergency department - [facility] consultation with social work for APS referral  8)Severe hepatic steatosis/alcoholic hepatitis - Secondary to chronic alcohol abuse - He should follow-up with GI on outpatient basis for ongoing surveillance  9)Failure to thrive in adult/severe protein caloric malnutrition - Patient is severely deconditioned and malnourished and living in substandard conditions and we have asked for a physical therapy consultation and TOC social work consultation for possible APS referral and placement in a safe for home environment. -Dietitian consult appreciated, at risk for refeeding syndrome  10)Pneumoconiosis, coal, workers' [facility]) - Patient continues smoking cigarettes and we have ordered nicotine patch for him as well as bronchodilators as needed  11)HTN--- had hypotension postintubation, required Levophed for pressure support - Falls frequently - This is most likely secondary to chronic alcohol intoxication in addition to substandard living conditions - PT evaluation recommending SNF rehab  - Fall precautions recommended --working on SNF rehab placement   CRITICAL CARE Performed by: Shon Hale   Total critical care time: 48 minutes  Critical care time was exclusive of separately billable procedures and treating other patients.  Critical care was necessary to treat or prevent imminent or life-threatening deterioration. -Remains intubated and  sedated-propofol/fentanyl -Hypotension requiring  pressors Levophed -Vent--PRVC /40%/5/18/640 TV  Critical care was time spent personally by me on the following activities: development of treatment plan with patient and/or surrogate as well as nursing, discussions with consultants, evaluation of patient's response to treatment, examination of patient, obtaining history from patient or surrogate, ordering and performing treatments and interventions, ordering and review of laboratory studies, ordering and review of radiographic studies, pulse oximetry and re-evaluation of patient's condition.   DVT prophylaxis: enoxaparin Code Status: full  Family Communication: none present  Disposition: Currently intubated and sedated Not medically ready for discharge Status is: Inpatient Remains inpatient appropriate because: IV fluids, electrolyte replacement , intubated and sedated   Consultants: palliative care, PT /pulmonary critical care  Procedures:  10/19/2021 intubation Antimicrobials:  Unasyn started on 10/19/2021  Subjective: Intubated and sedated overnight, -Developed hypotension requiring pressors  Objective: Vitals:   10/20/21 1345 10/20/21 1503 10/20/21 1506 10/20/21 1607  BP: (!) 107/56     Pulse:      Resp: 18     Temp:    98.1 F (36.7 C)  TempSrc:    Axillary  SpO2:  99% 100%   Weight:      Height:        Intake/Output Summary (Last 24 hours) at 10/20/2021 1744 Last data filed at 10/20/2021 1736 Gross per 24 hour  Intake 3908.55 ml  Output --  Net 3908.55 ml   Filed Weights   10/13/21 0621 10/13/21 1347 10/20/21 0513  Weight: 61.2 kg 57.3 kg 61.1 kg   Physical Exam  Gen:-Intubated and sedated HEENT:-  .AT, No sclera icterus HEENT-OG and ET tube Lungs-diminished breath sounds, no wheezing  CV- S1, S2 normal, RRR Abd-  +ve B.Sounds, Abd Soft, No tenderness,    Extremity/Skin:- No  edema,   good pedal pulses  Neuro-Psych-intubated and sedated  Data Reviewed: I  have personally reviewed following labs and imaging studies  CBC: Recent Labs  Lab 10/16/21 0605 10/19/21 0548 10/20/21 0320  WBC 7.0 6.1 7.5  HGB 12.1* 10.1* 10.2*  HCT 36.3* 28.6* 31.2*  MCV 103.7* 100.7* 104.7*  PLT 157 278 [id number]    Basic Metabolic Panel: Recent Labs  Lab 10/14/21 0618 10/15/21 0845 10/16/21 0605 10/17/21 0600 10/19/21 0548 10/20/21 0320  NA 134* 135  --  135 133* 138  K 3.4* 3.8  --  3.6 3.9 3.3*  CL 100 103  --  101 100 107  CO2 26 26  --  28 25 27   GLUCOSE 112* 104*  --  110* 112* 136*  BUN 17 10  --  15 15 16   CREATININE 0.62 0.55*  --  0.73 0.52* 0.52*  CALCIUM 8.4* 8.6*  --  8.3* 8.7* 8.8*  MG 1.5* 1.8 2.1  --  1.9  --   PHOS  --   --  3.1  --   --   --     CBG: Recent Labs  Lab 10/16/21 0734 10/20/21 0642 10/20/21 0737 10/20/21 1142 10/20/21 1537  GLUCAP 116* 134* 128* 116* 126*    Recent Results (from the past 240 hour(s))  SARS Coronavirus 2 by RT PCR (hospital order, performed in [facility] hospital lab) *cepheid single result test* Anterior Nasal Swab     Status: None   Collection Time: 10/13/21  7:13 AM   Specimen: Anterior Nasal Swab  Result Value Ref Range Status   SARS Coronavirus 2 by RT PCR NEGATIVE NEGATIVE Final    Comment: (NOTE) SARS-CoV-2 target nucleic acids are NOT DETECTED.  The SARS-CoV-2 RNA is generally detectable in upper and lower respiratory specimens during the acute phase of infection. The lowest concentration of SARS-CoV-2 viral copies this assay can detect is 250 copies / mL. A negative result does not preclude SARS-CoV-2 infection and should not be used as the sole basis for treatment or other patient management decisions.  A negative result may occur with improper specimen collection / handling, submission of specimen other than nasopharyngeal swab, presence of viral mutation(s) within the areas targeted by this assay, and inadequate number of viral copies (<250 copies / mL). A negative result  must be combined with clinical observations, patient history, and epidemiological information.  Fact Sheet for Patients:   RoadLapTop.co.za  Fact Sheet for Healthcare Providers: http://kim-miller.com/  This test is not yet approved or  cleared by the [place] FDA and has been authorized for detection and/or diagnosis of SARS-CoV-2 by FDA under an Emergency Use Authorization (EUA).  This EUA will remain in effect (meaning this test can be used) for the duration of the COVID-19 declaration under Section 564(b)(1) of the Act, 21 U.S.C. section 360bbb-3(b)(1), unless the authorization is terminated or revoked sooner.  Performed at [facility], [address], [place], [place] [address]      Radiology Studies: DG CHEST PORT 1 VIEW  Result Date: 10/20/2021 CLINICAL DATA:  Shortness of breath COPD EXAM: PORTABLE CHEST 1 VIEW COMPARISON:  Previous studies including the examination of 10/19/2021 FINDINGS: Transverse diameter of heart is slightly increased. There is dense infiltrate in the medial left lower lung fields obscuring left hemidiaphragm. There is blunting of left lateral CP angle suggesting small effusion. Low position of diaphragms suggests COPD. There are numerous scattered calcifications in both lungs suggesting healed granulomas. Tip of endotracheal tube is 4.1 cm above the carina. Enteric tube is noted traversing the esophagus. There is dislocation in the right shoulder. IMPRESSION: Infiltrates in the left lower lung fields suggest atelectasis/pneumonia. Small left pleural effusion. COPD. No significant interval changes are noted. Electronically Signed   By: Ernie Avena M.D.   On: 10/20/2021 08:19   DG Chest 1 View  Result Date: 10/19/2021 CLINICAL DATA:  Intubation and NG placement. Multiple left rib fractures. EXAM: CHEST  1 VIEW COMPARISON:  10/19/2021 FINDINGS: Interval placement of an endotracheal tube with tip measuring  6 cm above the carina. An enteric tube has been placed with tip off the field of view but below the left hemidiaphragm. Minimally displaced left lower rib fractures are identified. Old appearing fracture deformities on the right ribs. No visualized pneumothorax. Postoperative changes in the right shoulder. Anterior dislocation of the right shoulder was better seen on prior study from earlier today. This is a chronic finding and is been present on multiple prior studies dating back to 08/29/2020. Multiple calcified granulomas are demonstrated throughout the lungs. Probable atelectasis in the left lung base behind the heart. No pleural effusions. No focal consolidation. IMPRESSION: Appliances appear in satisfactory position. Otherwise no change since previous study today. Electronically Signed   By: Burman Nieves M.D.   On: 10/19/2021 21:27   DG CHEST PORT 1 VIEW  Result Date: 10/19/2021 CLINICAL DATA:  Pain and weakness. EXAM: PORTABLE CHEST 1 VIEW COMPARISON:  10/16/2021. FINDINGS: 5:03 a.m., 10/19/2021.  The patient is rotated to the left. Innumerable calcified lung granulomas are again noted and scattered calcified pleural plaquing. The lungs are emphysematous. Patchy hazy increased opacity is noted today in the right lung base and increased denser consolidation/atelectasis in the left  lower lobe with development of small bilateral pleural effusions. No other new lung opacity is seen. The cardiac size is normal. There is aortic atherosclerosis with stable mediastinum. Osteopenia. 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 IMPRESSION: Increased opacities in the left-greater-than-right lung bases and small pleural effusions. Findings concerning for pneumonia or aspiration. Background chronic change described above. Electronically Signed   By: Telford Nab M.D.   On: 10/19/2021 07:11    Scheduled Meds:  Chlorhexidine Gluconate Cloth  6  each Topical Daily   cholecalciferol  1,000 Units Per Tube Daily   enoxaparin (LOVENOX) injection  40 mg Subcutaneous Q24H   feeding supplement (PROSource TF)  45 mL Per Tube TID   folic acid  1 mg Intravenous Daily   ipratropium-albuterol  3 mL Nebulization Q6H   nicotine  21 mg Transdermal Daily   mouth rinse  15 mL Mouth Rinse Q2H   pantoprazole (PROTONIX) IV  40 mg Intravenous Daily   thiamine injection  100 mg Intravenous Daily   Continuous Infusions:  sodium chloride Stopped (10/19/21 2251)   ampicillin-sulbactam (UNASYN) IV 3 g (10/20/21 1426)   dextrose 5 % and 0.9% NaCl 100 mL/hr at 10/20/21 1736   feeding supplement (OSMOLITE 1.2 CAL) 20 mL/hr at 10/20/21 1736   fentaNYL infusion INTRAVENOUS Stopped (10/20/21 1501)   norepinephrine (LEVOPHED) Adult infusion Stopped (10/20/21 1425)   propofol (DIPRIVAN) infusion 20 mcg/kg/min (10/20/21 1736)    LOS: 7 days   Roxan Hockey, MD How to contact the [facility] Attending or Consulting provider [place] or covering provider during after hours [place], for this patient?  Check the care team in [facility] and look for a) attending/consulting [facility] provider listed and b) the [facility] team listed Log into [email or web address] and use [facility] universal password to access. If you do not have the password, please contact the hospital operator. Locate the [facility] provider you are looking for under [facility] and page to a number that you can be directly reached. If you still have difficulty reaching the provider, please page the [facility] (Director on Call) for the Hospitalists listed on amion for assistance.  10/20/2021, 5:44 PM

## 2021-10-20 NOTE — Progress Notes (Signed)
eLink Physician-Brief Progress Note Patient Name: Larry [name] DOB: 1951/04/04 MRN: [id number]   Date of Service  10/20/2021  HPI/Events of Note  Patient needs an order for bilateral soft wrist  restraints to prevent self extubation.  eICU Interventions  Restraints ordered.        Makenzye Troutman U Aric Jost 10/20/2021, 1:12 AM

## 2021-10-20 NOTE — Progress Notes (Signed)
Nutrition Follow-up  DOCUMENTATION CODES:   Severe malnutrition in context of social or environmental circumstances, Underweight  INTERVENTION:  -Osmolite 1.2 @ 20 ml/hr to advance 10 ml/hr q 6 hr to goal rate of 50 ml/hr (1200 ml) per tube   -ProSource TF 45 ml TID per tube  Provides: 1560 kcal, 99 gr protein and 984 ml water.   If no IVF- add free water 200 ml TID per tube  Monitor magnesium, potassium, and phosphorus BID for at least 3 days, MD to replete as needed, as pt is at risk for refeeding syndrome given severe PCM.   NUTRITION DIAGNOSIS:   Severe Malnutrition related to social / environmental circumstances as evidenced by severe fat depletion, severe muscle depletion.  GOAL:   Patient will meet greater than or equal to 90% of their needs   MONITOR:   PO intake, Supplement acceptance, Labs, Weight trends  REASON FOR ASSESSMENT:   Malnutrition Screening Tool    ASSESSMENT:   Pt admitted from home with uncontrolled pain and weakness. 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  Patient is currently intubated on ventilator support since 6/28 @ 2053. Prior to intubation 6/26-breakfast 20%, 0% of lunch and dinner. 6/27 and 6/28 -0% of all meals. Estimated nutrition needs re-assessed.   Patient started in Osmolite 1.2 @ 20 ml/hr to advance to goal rate of 50 ml/hr (1200 ml). Provides: 1440 kcal, 66 gr protein and 984 ml water. Patient will need additional protein to meet needs- Prosource TF 45 ml TID per tube.   IVF- D5 NS@100  m l/hr  Palliative medicine following. Patient remains full code.   MV: 10.3 L/min Temp (24hrs), Avg:98.1 F (36.7 C), Min:97.6 F (36.4 C), Max:98.7 F (37.1 C) MAP-71 Norepinephrine @ 7.5 ml/hr Propofol: 6.88 ml/hr  Fentanyl: 5 ml/hr  Intake/Output Summary (Last 24 hours) at  10/20/2021 1642 Last data filed at 10/20/2021 1610 Gross per 24 hour  Intake 3525.1 ml  Output --  Net 3525.1 ml    Last BM-6/22 (bowel incontinence)  Medications: vitamin D3, Folic acid, nicotine, thiamin and protonix.     Latest Ref Rng & Units 10/20/2021    3:20 AM 10/19/2021    5:48 AM 10/17/2021    6:00 AM  BMP  Glucose 70 - 99 mg/dL 789  381  017   BUN 8 - 23 mg/dL 16  15  15    Creatinine 0.61 - 1.24 mg/dL  5.10  2.58   Sodium 135 - 145 mmol/L 138  133  135   Potassium 3.5 - 5.1 mmol/L 3.3  3.9  3.6   Chloride 98 - 111 mmol/L 107  100  101   CO2 22 - 32 mmol/L 27  25  28    Calcium 8.9 - 10.3 mg/dL 8.8  8.7  8.3      Diet Order:   Diet Order             Diet NPO time specified Except for: Sips with Meds  Diet effective now                  EDUCATION NEEDS:   No education needs have been identified at this time  Skin:  Skin Assessment: Reviewed RN Assessment  Last BM:  6/22  Height:   Ht Readings from Last 1 Encounters:  10/19/21 6\' 2"  (1.88 m)    Weight:  Wt Readings from Last 1 Encounters:  10/20/21 61.1 kg    Ideal Body Weight:   86 kg  BMI:  Body mass index is 17.29 kg/m.  Estimated Nutritional Needs:   Kcal:  1674  Protein:  90-100 gr  Fluid:  >1600 ml daily  Royann Shivers MS,RD,CSG,LDN Contact: Loretha Stapler

## 2021-10-20 NOTE — Progress Notes (Signed)
Lavaged and suctioned patient. Patient tolerated with saturations maintaining around 100%. RT will cont. To monitor

## 2021-10-21 ENCOUNTER — Inpatient Hospital Stay (HOSPITAL_COMMUNITY): Payer: Medicare HMO

## 2021-10-21 DIAGNOSIS — J9601 Acute respiratory failure with hypoxia: Secondary | ICD-10-CM | POA: Diagnosis not present

## 2021-10-21 DIAGNOSIS — T17908A Unspecified foreign body in respiratory tract, part unspecified causing other injury, initial encounter: Secondary | ICD-10-CM | POA: Diagnosis not present

## 2021-10-21 DIAGNOSIS — R52 Pain, unspecified: Secondary | ICD-10-CM | POA: Diagnosis not present

## 2021-10-21 LAB — RENAL FUNCTION PANEL
Albumin: 2.1 g/dL — ABNORMAL LOW (ref 3.5–5.0)
Anion gap: 6 (ref 5–15)
BUN: 16 mg/dL (ref 8–23)
CO2: 23 mmol/L (ref 22–32)
Calcium: 8.5 mg/dL — ABNORMAL LOW (ref 8.9–10.3)
Chloride: 111 mmol/L (ref 98–111)
Creatinine, Ser: 0.48 mg/dL — ABNORMAL LOW (ref 0.61–1.24)
GFR, Estimated: >60 mL/min (ref >60)
Glucose, Bld: 127 mg/dL — ABNORMAL HIGH (ref 70–99)
Phosphorus: 3.3 mg/dL (ref 2.5–4.6)
Potassium: 3.7 mmol/L (ref 3.5–5.1)
Sodium: 140 mmol/L (ref 135–145)

## 2021-10-21 LAB — CBC
HCT: 26.3 % — ABNORMAL LOW (ref 39.0–52.0)
Hemoglobin: 8.5 g/dL — ABNORMAL LOW (ref 13.0–17.0)
MCH: 33.9 pg (ref 26.0–34.0)
MCHC: 32.3 g/dL (ref 30.0–36.0)
MCV: 104.8 fL — ABNORMAL HIGH (ref 80.0–100.0)
Platelets: 296 10*3/uL (ref 150–400)
RBC: 2.51 MIL/uL — ABNORMAL LOW (ref 4.22–5.81)
RDW: 14.2 % (ref 11.5–15.5)
WBC: 6.1 10*3/uL (ref 4.0–10.5)
nRBC: 0 % (ref 0.0–0.2)

## 2021-10-21 LAB — GLUCOSE, CAPILLARY
Glucose-Capillary: 137 mg/dL — ABNORMAL HIGH (ref 70–99)
Glucose-Capillary: 126 mg/dL — ABNORMAL HIGH (ref 70–99)
Glucose-Capillary: 135 mg/dL — ABNORMAL HIGH (ref 70–99)
Glucose-Capillary: 124 mg/dL — ABNORMAL HIGH (ref 70–99)
Glucose-Capillary: 134 mg/dL — ABNORMAL HIGH (ref 70–99)

## 2021-10-21 LAB — MAGNESIUM: Magnesium: 1.9 mg/dL (ref 1.7–2.4)

## 2021-10-21 MED ORDER — FREE WATER
200.0000 mL | Status: DC
Start: 2021-10-21 — End: 2021-10-27
  Administered 2021-10-21 – 2021-10-27 (×34): 200 mL

## 2021-10-21 MED ORDER — PANTOPRAZOLE 2 MG/ML SUSPENSION
40.0000 mg | Freq: Every day | ORAL | Status: DC
  Administered 2021-10-22 – 2021-10-27 (×6): 40 mg
  Filled 2021-10-21 (×6): qty 20

## 2021-10-21 MED ORDER — PANTOPRAZOLE 2 MG/ML SUSPENSION: 40.0000 mg | Freq: Every day | ORAL | Status: DC

## 2021-10-21 MED ORDER — DOCUSATE SODIUM 50 MG/5ML PO LIQD: 100.0000 mg | Freq: Two times a day (BID) | ORAL | Status: DC | PRN

## 2021-10-21 MED ORDER — DEXMEDETOMIDINE HCL IN NACL 400 MCG/100ML IV SOLN
0.4000 ug/kg/h | INTRAVENOUS | Status: DC
  Administered 2021-10-21: 0.4 ug/kg/h via INTRAVENOUS
  Administered 2021-10-22 (×2): 1 ug/kg/h via INTRAVENOUS
  Administered 2021-10-23: 1.2 ug/kg/h via INTRAVENOUS
  Administered 2021-10-23: 0.4 ug/kg/h via INTRAVENOUS
  Administered 2021-10-24: 0.7 ug/kg/h via INTRAVENOUS
  Administered 2021-10-24: 0.6 ug/kg/h via INTRAVENOUS
  Administered 2021-10-25: 0.8 ug/kg/h via INTRAVENOUS
  Administered 2021-10-25: 0.6 ug/kg/h via INTRAVENOUS
  Administered 2021-10-25: 0.7 ug/kg/h via INTRAVENOUS
  Administered 2021-10-26 (×2): 0.6 ug/kg/h via INTRAVENOUS
  Filled 2021-10-21 (×15): qty 100

## 2021-10-21 MED ORDER — ALBUTEROL SULFATE (2.5 MG/3ML) 0.083% IN NEBU
2.5000 mg | INHALATION_SOLUTION | RESPIRATORY_TRACT | Status: DC | PRN
  Administered 2021-10-30: 2.5 mg via RESPIRATORY_TRACT
  Filled 2021-10-21: qty 3

## 2021-10-21 NOTE — Progress Notes (Signed)
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  He admits to drinking one half of 1/5 of whiskey daily.  He is mostly complaining of pain to his mid and lower back.  His home was noted to be in terrible condition and he was covered and bedbugs and all of his clothing had to be removed and he had to be bathed in the ED.  He was sent for work-up in the ED including CT scans and x-rays and noted to have displaced rib fractures in the posterior left 11th and 12th ribs and mildly displaced left 10th rib fracture and severe hepatic steatosis and left L2 transverse process acute fracture.  He is notably in severe pain and noted to be hyponatremic as well.  Admission was requested for pain management and possible placement.  He has severe problems with frequent falling likely related to alcohol intoxication.   Assessment and Plan: 1) acute hypoxic respiratory failure--- in the setting of aspiration pneumonia and full-blown delirium tremens -Intubated 10/19/2021 -Pulmonary consult appreciated --Waking up a bit better with sedation vacation -Did better today with CPAP trial  2)Uncontrolled pain/multiple rib fractures/Lumbar transverse process fracture - TLSO brace to assist with severe rib fracture pain when he comes off vent -Opiates as above  3)Aspiration pneumonia with acute hypoxic respiratory failure -In the setting of alcohol abuse/delirium tremens  -Continue IV Unasyn  (started on 10/19/2021) -c/n  bronchodilators, -Currently intubated and sedated  4)Delirium tremens----currently intubated and sedated - transitioned Precedex from propofol on 10/21/2021  5)Hypomagnesemia/hyponatremia -- Hydrate and replace lites  6)Macrocytic anemia - serum B12  Is 973, low folic acid level being repleted  7)Bedbug infestation - Reportedly noted to be living in substandard horrible conditions according to [facility] EMS report - He has been thoroughly cleansed and clothing changed out in the emergency department - [facility] consultation with social work for APS referral  8)Severe hepatic steatosis/alcoholic hepatitis - Secondary to chronic alcohol abuse - He should follow-up with GI on outpatient basis for ongoing surveillance  9)Failure to thrive in adult/severe protein caloric malnutrition - Patient is severely deconditioned and malnourished and living in substandard conditions and we have asked for a physical therapy consultation and TOC social work consultation for possible APS referral and placement in a safe for home environment. -Dietitian consult appreciated, at risk for refeeding syndrome  10)Pneumoconiosis, coal, workers' [facility]) - Patient continues smoking cigarettes and we have ordered nicotine patch for him as well as bronchodilators as needed  11)HTN--- had hypotension postintubation, required Levophed for pressure support -  12)FEN--continue Osmolite tube feed with water flushes  Falls frequently - This is most likely secondary to chronic alcohol intoxication in addition to substandard living conditions - PT evaluation recommending SNF rehab  - Fall precautions recommended --working on SNF rehab placement   CRITICAL CARE Performed by: Shon Hale   Total critical care time: 43 minutes  Critical care time was exclusive of separately billable procedures and treating other patients.  Critical care was necessary to treat or  prevent imminent or  life-threatening deterioration. -Remains intubated and sedated-propofol/fentanyl -Hypotension requiring pressors Levophed -Vent--PRVC /40%/5/18/640 TV  Critical care was time spent personally by me on the following activities: development of treatment plan with patient and/or surrogate as well as nursing, discussions with consultants, evaluation of patient's response to treatment, examination of patient, obtaining history from patient or surrogate, ordering and performing treatments and interventions, ordering and review of laboratory studies, ordering and review of radiographic studies, pulse oximetry and re-evaluation of patient's condition.   DVT prophylaxis: enoxaparin Code Status: full  Family Communication: none present  Disposition: Currently intubated and sedated Not medically ready for discharge Status is: Inpatient Remains inpatient appropriate because: IV fluids, electrolyte replacement , intubated and sedated   Consultants: palliative care, PT /pulmonary critical care  Procedures:  10/19/2021 intubation Antimicrobials:  Unasyn started on 10/19/2021  Subjective: Intubated and sedated -Waking up a bit better with sedation vacation -Did better today with CPAP trial  Objective: Vitals:   10/21/21 1830 10/21/21 1845 10/21/21 1900 10/21/21 1945  BP: 115/61 117/62 140/71   Pulse: 94 91 97   Resp: (!) 31 (!) 28 (!) 31   Temp:    98.9 F (37.2 C)  TempSrc:    Axillary  SpO2: 98% 98% 99%   Weight:      Height:        Intake/Output Summary (Last 24 hours) at 10/21/2021 1951 Last data filed at 10/21/2021 1939 Gross per 24 hour  Intake 3074.76 ml  Output 575 ml  Net 2499.76 ml   Filed Weights   10/13/21 1347 10/20/21 0513 10/21/21 0500  Weight: 57.3 kg 61.1 kg 65 kg   Physical Exam  Gen:-Intubated and sedated  HEENT:- [place].AT, No sclera icterus HEENT-OG and ET tube Lungs-improving air movement, no wheezing  CV- S1, S2 normal, RRR Abd-  +ve B.Sounds, Abd Soft, No  tenderness,    Extremity/Skin:- No  edema,   good pedal pulses  Neuro-Psych-intubated and sedated GU--good urine output  Data Reviewed: I have personally reviewed following labs and imaging studies  CBC: Recent Labs  Lab 10/16/21 0605 10/19/21 0548 10/20/21 0320 10/21/21 0406  WBC 7.0 6.1 7.5 6.1  HGB 12.1* 10.1* 10.2* 8.5*  HCT 36.3* 28.6* 31.2* 26.3*  MCV 103.7* 100.7* 104.7* 104.8*  PLT 157 278 298 296    Basic Metabolic Panel: Recent Labs  Lab 10/15/21 0845 10/16/21 0605 10/17/21 0600 10/19/21 0548 10/20/21 0320 10/21/21 0406  NA 135  --  135 133* 138 140  K 3.8  --  3.6 3.9 3.3* 3.7  CL 103  --  101 100 107 111  CO2 26  --  GLUCOSE 104*  --  110* 112* 136* 127*  BUN 10  --  CREATININE 0.55*  --  0.73 0.52* 0.52* 0.48*  CALCIUM 8.6*  --  8.3* 8.7* 8.8* 8.5*  MG 1.8 2.1  --  1.9  --  1.9  PHOS  --  3.1  --   --   --  3.3    CBG: Recent Labs  Lab 10/21/21 0500 10/21/21 0745 10/21/21 1132 10/21/21 1652 10/21/21 1943  GLUCAP 137* 126* 135* 124* 134*    Recent Results (from the past 240 hour(s))  SARS Coronavirus 2 by RT PCR (hospital order, performed in [facility] hospital lab) *cepheid single result test* Anterior Nasal Swab     Status: None   Collection Time: 10/13/21  7:13 AM   Specimen:  Anterior Nasal Swab  Result Value Ref Range Status   SARS Coronavirus 2 by RT PCR NEGATIVE NEGATIVE Final    Comment: (NOTE) SARS-CoV-2 target nucleic acids are NOT DETECTED.  The SARS-CoV-2 RNA is generally detectable in upper and lower respiratory specimens during the acute phase of infection. The lowest concentration of SARS-CoV-2 viral copies this assay can detect is 250 copies / mL. A negative result does not preclude SARS-CoV-2 infection and should not be used as the sole basis for treatment or other patient management decisions.  A negative result may occur with improper specimen collection / handling, submission of specimen  other than nasopharyngeal swab, presence of viral mutation(s) within the areas targeted by this assay, and inadequate number of viral copies (<250 copies / mL). A negative result must be combined with clinical observations, patient history, and epidemiological information.  Fact Sheet for Patients:   RoadLapTop.co.za  Fact Sheet for Healthcare Providers: http://kim-miller.com/  This test is not yet approved or  cleared by the [place] FDA and has been authorized for detection and/or diagnosis of SARS-CoV-2 by FDA under an Emergency Use Authorization (EUA).  This EUA will remain in effect (meaning this test can be used) for the duration of the COVID-19 declaration under Section 564(b)(1) of the Act, 21 U.S.C. section 360bbb-3(b)(1), unless the authorization is terminated or revoked sooner.  Performed at [facility], [address] [place], [place] [address]   Culture, Respiratory w Gram Stain     Status: None (Preliminary result)   Collection Time: 10/20/21  3:00 PM   Specimen: Tracheal Aspirate; Respiratory  Result Value Ref Range Status   Specimen Description   Final    TRACHEAL ASPIRATE Performed at [facility], [address] [place], [place] [address]    Special Requests   Final    NONE Performed at [facility], [address], [place], [place] [address]    Gram Stain   Final    NO SQUAMOUS EPITHELIAL CELLS SEEN FEW WBC SEEN FEW GRAM POSITIVE COCCI    Culture   Final    TOO YOUNG TO READ Performed at [facility], [address], [place], [place] [address]    Report Status PENDING  Incomplete     Radiology Studies: DG CHEST PORT 1 VIEW  Result Date: 10/21/2021 CLINICAL DATA:  Dysarthria/copd/htn/smoker EXAM: PORTABLE CHEST - 1 VIEW COMPARISON:  10/20/2021 FINDINGS: Endotracheal tube and gastric tube stable position. Improved aeration of the left lung base with some residual linear scarring or atelectasis.  Scattered calcified granulomas bilaterally are again noted. Heart size and mediastinal contours are within normal limits. No effusion. Orthopedic hardware in the proximal right humerus. IMPRESSION: Improving left lower lung aeration. Stable support hardware Electronically Signed   By: Corlis Leak M.D.   On: 10/21/2021 07:57   DG CHEST PORT 1 VIEW  Result Date: 10/20/2021 CLINICAL DATA:  Shortness of breath COPD EXAM: PORTABLE CHEST 1 VIEW COMPARISON:  Previous studies including the examination of 10/19/2021 FINDINGS: Transverse diameter of heart is slightly increased. There is dense infiltrate in the medial left lower lung fields obscuring left hemidiaphragm. There is blunting of left lateral CP angle suggesting small effusion. Low position of diaphragms suggests COPD. There are numerous scattered calcifications in both lungs suggesting healed granulomas. Tip of endotracheal tube is 4.1 cm above the carina. Enteric tube is noted traversing the esophagus. There is dislocation in the right shoulder. IMPRESSION: Infiltrates in the left lower lung fields suggest atelectasis/pneumonia. Small left  pleural effusion. COPD. No significant interval changes are noted. Electronically Signed   By: Ernie Avena M.D.   On: 10/20/2021 08:19   DG Chest 1 View  Result Date: 10/19/2021 CLINICAL DATA:  Intubation and NG placement. Multiple left rib fractures. EXAM: CHEST  1 VIEW COMPARISON:  10/19/2021 FINDINGS: Interval placement of an endotracheal tube with tip measuring 6 cm above the carina. An enteric tube has been placed with tip off the field of view but below the left hemidiaphragm. Minimally displaced left lower rib fractures are identified. Old appearing fracture deformities on the right ribs. No visualized pneumothorax. Postoperative changes in the right shoulder. Anterior dislocation of the right shoulder was better seen on prior study from earlier today. This is a chronic finding and is been present on  multiple prior studies dating back to 08/29/2020. Multiple calcified granulomas are demonstrated throughout the lungs. Probable atelectasis in the left lung base behind the heart. No pleural effusions. No focal consolidation. IMPRESSION: Appliances appear in satisfactory position. Otherwise no change since previous study today. Electronically Signed   By: Burman Nieves M.D.   On: 10/19/2021 21:27    Scheduled Meds:  Chlorhexidine Gluconate Cloth  6 each Topical Daily   cholecalciferol  1,000 Units Per Tube Daily   enoxaparin (LOVENOX) injection  40 mg Subcutaneous Q24H   feeding supplement (PROSource TF)  45 mL Per Tube TID   folic acid  1 mg Intravenous Daily   free water  200 mL Per Tube Q4H   ipratropium-albuterol  3 mL Nebulization Q6H   nicotine  21 mg Transdermal Daily   mouth rinse  15 mL Mouth Rinse Q2H   [START ON 10/22/2021] pantoprazole sodium  40 mg Per Tube Daily   Continuous Infusions:  sodium chloride 10 mL/hr at 10/21/21 1939   ampicillin-sulbactam (UNASYN) IV Stopped (10/21/21 1505)   dexmedetomidine (PRECEDEX) IV infusion 0.4 mcg/kg/hr (10/21/21 1939)   feeding supplement (OSMOLITE 1.2 CAL) 50 mL/hr at 10/21/21 1939    LOS: 8 days   Shon Hale, MD How to contact the [facility] Attending or Consulting provider 7A - 7P or covering provider during after hours 7P -7A, for this patient?  Check the care team in [facility] and look for a) attending/consulting TRH provider listed and b) the [facility] team listed Log into [email or web address] and use [place]'s universal password to access. If you do not have the password, please contact the hospital operator. Locate the [facility] provider you are looking for under [facility] and page to a number that you can be directly reached. If you still have difficulty reaching the provider, please page the [facility] (Director on Call) for the Hospitalists listed on amion for assistance.  10/21/2021, 7:51 PM

## 2021-10-21 NOTE — Progress Notes (Signed)
NAME:  Larry [name], MRN:  [id number], DOB:  October 28, 1950, LOS: 8 ADMISSION DATE:  10/13/2021, CONSULTATION DATE:  10/21/2021  REFERRING MD:  Marisa Severin TRH , CHIEF COMPLAINT: Respiratory failure  History of Present Illness:  71 year old EtOH user, smoker BIBEMS on 6/22 for generalized weakness and multiple falls over the past several months.  He had pain in his mid to lower back .  His home was in a terrible condition, there were bedbugs.  He was severely malnourished initial imaging showed displaced rib fracture posterior left 10th, 11th and 12th ribs and left T2 transverse process fracture. Initial labs were significant for hyponatremia. Social work and APS referrals were made On 6/28 he was noted to be confused, disoriented with tremors consistent with DTs.  CIWA protocol was started and he was transferred to stepdown.  He was noted to be febrile, coughing and dyspnea consistent with aspiration.  He required intubation and mechanical ventilation, hypotensive requiring fluid bolus and Levophed drip  Pertinent  Medical History  EtOH abuse Hypertension Pneumoconiosis -coalminer's disease  Significant Hospital Events: Including procedures, antibiotic start and stop dates in addition to other pertinent events   6/29 ETT >>  Interim History / Subjective:   Critically ill, off Levophed drip. Remains intubated, off propofol Weaning on pressure support 14/5 Afebrile , tachycardic  Objective   Blood pressure (!) 158/75, pulse (!) 105, temperature 99.2 F (37.3 C), temperature source Axillary, resp. rate 20, height 6\' 2"  (1.88 m), weight 65 kg, SpO2 100 %.    Vent Mode: CPAP FiO2 (%):  [40 %] 40 % Set Rate:  [18 bmp] 18 bmp Vt Set:  [640 mL] 640 mL PEEP:  [5 cmH20] 5 cmH20 Pressure Support:  [14 cmH20] 14 cmH20 Plateau Pressure:  [10 cmH20-20 cmH20] 20 cmH20   Intake/Output Summary (Last 24 hours) at 10/21/2021 1355 Last data filed at 10/21/2021 1306 Gross per 24 hour  Intake 3358.85  ml  Output 900 ml  Net 2458.85 ml    Filed Weights   10/13/21 1347 10/20/21 0513 10/21/21 0500  Weight: 57.3 kg 61.1 kg 65 kg    Examination: General: Chronically ill-appearing man, intubated and sedated HENT: Mild pallor, no icterus Lungs: Mild accessory muscle use, bilateral air entry, no rhonchi Cardiovascular: S1-S2 tacky, no murmur Abdomen: Soft, nontender, no hepatosplenomegaly Extremities: No deformity, no edema Neuro: Sedate, does not follow commands, off drips    Labs show normal sodium, mild hypokalemia, albumin 2.4, no leukocytosis, stable anemia  Chest x-ray shows hyperinflation, extensive granulomatous disease which is chronic bibasilar infiltrates have improved Resolved Hospital Problem list   Hyponatremia  Assessment & Plan:  Acute hypoxic respiratory failure Aspiration pneumonia   -Vent settings reviewed and adjusted -Continue spontaneous breathing trials daily, decrease pressure support as tolerated -Continue Unasyn for now, await respiratory culture -Hopeful that we can get him extubated in 1 to 2 days  Left-sided rib fractures, vertebral fracture  acute pain -Consider nonnarcotics  Acute metabolic encephalopathy Delirium tremens  -Switch to Precedex and intermittent fentanyl for goal RASS 0 to -1 -Versed as needed for breakthrough agitation    Best Practice (right click and "Reselect all SmartList Selections" daily)   Diet/type: tubefeeds DVT prophylaxis: LMWH GI prophylaxis: PPI Lines: N/A Foley:  N/A Code Status:  full code Last date of multidisciplinary goals of care discussion [palliative care involved, no HCPOA, full code by default, TCC team working on APS referral/DSS guardianship]  Labs   CBC: Recent Labs  Lab 10/16/21 0605 10/19/21 0548  10/20/21 0320 10/21/21 0406  WBC 7.0 6.1 7.5 6.1  HGB 12.1* 10.1* 10.2* 8.5*  HCT 36.3* 28.6* 31.2* 26.3*  MCV 103.7* 100.7* 104.7* 104.8*  PLT 157 278 298 296     Basic Metabolic  Panel: Recent Labs  Lab 10/15/21 0845 10/16/21 0605 10/17/21 0600 10/19/21 0548 10/20/21 0320 10/21/21 0406  NA 135  --  135 133* 138 140  K 3.8  --  3.6 3.9 3.3* 3.7  CL 103  --  101 100 107 111  CO2 26  --  28 25 27 23   GLUCOSE 104*  --  110* 112* 136* 127*  BUN 10  --  15 15 16 16   CREATININE 0.55*  --  0.73 0.52* 0.52* 0.48*  CALCIUM 8.6*  --  8.3* 8.7* 8.8* 8.5*  MG 1.8 2.1  --  1.9  --  1.9  PHOS  --  3.1  --   --   --  3.3    GFR: Estimated Creatinine Clearance: 79 mL/min (A) (by C-G formula based on SCr of 0.48 mg/dL (L)). Recent Labs  Lab 10/16/21 0605 10/19/21 0548 10/20/21 0320 10/21/21 0406  WBC 7.0 6.1 7.5 6.1     Liver Function Tests: Recent Labs  Lab 10/20/21 0320 10/21/21 0406  AST 21  --   ALT 23  --   ALKPHOS 87  --   BILITOT 0.5  --   PROT 5.8*  --   ALBUMIN 2.4* 2.1*    No results for input(s): "LIPASE", "AMYLASE" in the last 168 hours. No results for input(s): "AMMONIA" in the last 168 hours.  ABG    Component Value Date/Time   PHART 7.44 10/20/2021 0520   PCO2ART 44 10/20/2021 0520   PO2ART 90 10/20/2021 0520   HCO3 29.9 (H) 10/20/2021 0520   TCO2 23 02/28/2018 1643   O2SAT 100 10/20/2021 0520     Coagulation Profile: No results for input(s): "INR", "PROTIME" in the last 168 hours.  Cardiac Enzymes: No results for input(s): "CKTOTAL", "CKMB", "CKMBINDEX", "TROPONINI" in the last 168 hours.  HbA1C: No results found for: "HGBA1C"  CBG: Recent Labs  Lab 10/20/21 1931 10/20/21 2304 10/21/21 0500 10/21/21 0745 10/21/21 1132  GLUCAP 132* 116* 137* 126* 135*       Critical care time: 32 minutes       10/23/21 MD. FCCP. [place] [facility] Pager : [phone]  If no response to pager , please call [phone] until 7 pm After 7:00 pm call Elink  [phone]   10/21/2021

## 2021-10-22 ENCOUNTER — Inpatient Hospital Stay (HOSPITAL_COMMUNITY): Payer: Medicare HMO

## 2021-10-22 DIAGNOSIS — R52 Pain, unspecified: Secondary | ICD-10-CM | POA: Diagnosis not present

## 2021-10-22 LAB — COMPREHENSIVE METABOLIC PANEL
ALT: 73 U/L — ABNORMAL HIGH (ref 0–44)
AST: 82 U/L — ABNORMAL HIGH (ref 15–41)
Albumin: 2 g/dL — ABNORMAL LOW (ref 3.5–5.0)
Alkaline Phosphatase: 172 U/L — ABNORMAL HIGH (ref 38–126)
Anion gap: 5 (ref 5–15)
BUN: 15 mg/dL (ref 8–23)
CO2: 25 mmol/L (ref 22–32)
Calcium: 8.5 mg/dL — ABNORMAL LOW (ref 8.9–10.3)
Chloride: 109 mmol/L (ref 98–111)
Creatinine, Ser: 0.51 mg/dL — ABNORMAL LOW (ref 0.61–1.24)
GFR, Estimated: >60 mL/min (ref >60)
Glucose, Bld: 144 mg/dL — ABNORMAL HIGH (ref 70–99)
Potassium: 3.7 mmol/L (ref 3.5–5.1)
Sodium: 139 mmol/L (ref 135–145)
Total Bilirubin: 0.5 mg/dL (ref 0.3–1.2)
Total Protein: 5.4 g/dL — ABNORMAL LOW (ref 6.5–8.1)

## 2021-10-22 LAB — CBC
HCT: 26.3 % — ABNORMAL LOW (ref 39.0–52.0)
Hemoglobin: 8.6 g/dL — ABNORMAL LOW (ref 13.0–17.0)
MCH: 33.9 pg (ref 26.0–34.0)
MCHC: 32.7 g/dL (ref 30.0–36.0)
MCV: 103.5 fL — ABNORMAL HIGH (ref 80.0–100.0)
Platelets: 302 10*3/uL (ref 150–400)
RBC: 2.54 MIL/uL — ABNORMAL LOW (ref 4.22–5.81)
RDW: 14.5 % (ref 11.5–15.5)
WBC: 4.8 10*3/uL (ref 4.0–10.5)
nRBC: 0 % (ref 0.0–0.2)

## 2021-10-22 LAB — GLUCOSE, CAPILLARY
Glucose-Capillary: 118 mg/dL — ABNORMAL HIGH (ref 70–99)
Glucose-Capillary: 126 mg/dL — ABNORMAL HIGH (ref 70–99)
Glucose-Capillary: 134 mg/dL — ABNORMAL HIGH (ref 70–99)
Glucose-Capillary: 141 mg/dL — ABNORMAL HIGH (ref 70–99)
Glucose-Capillary: 143 mg/dL — ABNORMAL HIGH (ref 70–99)
Glucose-Capillary: 151 mg/dL — ABNORMAL HIGH (ref 70–99)

## 2021-10-22 MED ORDER — DOCUSATE SODIUM 50 MG/5ML PO LIQD
200.0000 mg | Freq: Two times a day (BID) | ORAL | Status: DC
  Administered 2021-10-22 – 2021-10-23 (×2): 200 mg
  Filled 2021-10-22 (×2): qty 20

## 2021-10-22 NOTE — Progress Notes (Signed)
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  He admits to drinking one half of 1/5 of whiskey daily.  He is mostly complaining of pain to his mid and lower back.  His home was noted to be in terrible condition and he was covered and bedbugs and all of his clothing had to be removed and he had to be bathed in the ED.  He was sent for work-up in the ED including CT scans and x-rays and noted to have displaced rib fractures in the posterior left 11th and 12th ribs and mildly displaced left 10th rib fracture and severe hepatic steatosis and left L2 transverse process acute fracture.  He is notably in severe pain and noted to be hyponatremic as well.  Admission was requested for pain management and possible placement.  He has severe problems with frequent falling likely related to alcohol intoxication.   Assessment and Plan: 1) acute hypoxic respiratory failure--- in the setting of aspiration pneumonia and full-blown delirium tremens -Intubated 10/19/2021 -Pulmonary consult appreciated -Currently on Precedex for sedation and as needed fentanyl -10/22/21--Taking a long time to wake up with sedation vacation -Not doing well with CPAP trial -Is back on the vent sedated  2)Uncontrolled pain/multiple rib fractures/Lumbar transverse process fracture - TLSO brace to assist with severe rib fracture pain when he comes off vent -Opiates as above  3)Aspiration pneumonia with acute  hypoxic respiratory failure -In the setting of alcohol abuse/delirium tremens  -Continue IV Unasyn (started on 10/19/2021) -c/n  bronchodilators, -Currently intubated and sedated  4)Delirium tremens----currently intubated and sedated - transitioned Precedex from propofol on 10/21/2021  5)Hypomagnesemia/hyponatremia -- Hydrate and replace lites  6)Macrocytic anemia - serum [id number]  Is [id number], low folic acid level being repleted  7)Bedbug infestation - Reportedly noted to be living in substandard horrible conditions according to [facility] report - He has been thoroughly cleansed and clothing changed out in the emergency department - [facility] consultation with social work for APS referral  8)Severe hepatic steatosis/alcoholic hepatitis - Secondary to chronic alcohol abuse - He should follow-up with GI on outpatient basis for ongoing surveillance  9)Failure to thrive in adult/severe protein caloric malnutrition - Patient is severely deconditioned and malnourished and living in substandard conditions and we have asked for a physical therapy consultation and TOC social work consultation for possible APS referral and placement in a safe for home environment. -Dietitian consult appreciated, at risk for refeeding syndrome  10)Pneumoconiosis, coal, workers' [facility]) - Patient continues smoking cigarettes and we have ordered nicotine patch for him as well as bronchodilators as needed  11)HTN--- had hypotension postintubation, required Levophed for pressure support -Off Levophed at this time -BP stable  12)FEN--continue Osmolite tube feed with water flushes  Falls frequently - This is most likely secondary to chronic alcohol intoxication in addition to substandard living conditions - PT evaluation recommending SNF rehab  - Fall precautions recommended --working on SNF rehab placement   CRITICAL CARE Performed by: Roxan Hockey   Total critical care time: 62  minutes  Critical care time was  exclusive of separately billable procedures and treating other patients.  Critical care was necessary to treat or prevent imminent or life-threatening deterioration. -Remains intubated and sedated--Precedex/fentanyl -Vent--PRVC /35%/5/18/640 TV  Critical care was time spent personally by me on the following activities: development of treatment plan with patient and/or surrogate as well as nursing, discussions with consultants, evaluation of patient's response to treatment, examination of patient, obtaining history from patient or surrogate, ordering and performing treatments and interventions, ordering and review of laboratory studies, ordering and review of radiographic studies, pulse oximetry and re-evaluation of patient's condition.   DVT prophylaxis: enoxaparin Code Status: full code Family Communication: none present  Disposition: Currently intubated and sedated Not medically ready for discharge Status is: Inpatient Remains inpatient appropriate because: IV fluids, electrolyte replacement , intubated and sedated   Consultants: palliative care, PT /pulmonary critical care  Procedures:  10/19/2021 intubation Antimicrobials:  Unasyn started on 10/19/2021  Subjective: Intubated and sedated -Did not do well with CPAP trial -With sedation vacation and takes a long time to become minimally responsive -Tolerating tube feeding well  Objective: Vitals:   10/22/21 1415 10/22/21 1424 10/22/21 1430 10/22/21 1601  BP: (!) 145/78  133/68   Pulse: 96  94   Resp: (!) 31  (!) 27   Temp:    98.2 F (36.8 C)  TempSrc:    Axillary  SpO2: 100% 100% 99%   Weight:      Height:        Intake/Output Summary (Last 24 hours) at 10/22/2021 1623 Last data filed at 10/22/2021 1440 Gross per 24 hour  Intake 2156.1 ml  Output 945 ml  Net 1211.1 ml   Filed Weights   10/20/21 0513 10/21/21 0500 10/22/21 0500  Weight: 61.1 kg 65 kg 67.3 kg   Physical Exam  Gen:-Intubated and sedated  HEENT:-  [place].AT, No sclera icterus HEENT-OG and ET tube Lungs-improving air movement, no wheezing  CV- S1, S2 normal, RRR Abd-  +ve B.Sounds, Abd Soft, No tenderness,    Extremity/Skin:- No  edema,   good pedal pulses  Neuro-Psych-intubated and sedated GU--good urine output  Data Reviewed: I have personally reviewed following labs and imaging studies  CBC: Recent Labs  Lab 10/16/21 0605 10/19/21 0548 10/20/21 0320 10/21/21 0406 10/22/21 0536  WBC 7.0 6.1 7.5 6.1 4.8  HGB 12.1* 10.1* 10.2* 8.5* 8.6*  HCT 36.3* 28.6* 31.2* 26.3* 26.3*  MCV 103.7* 100.7* 104.7* 104.8* 103.5*  PLT 157 278 298 296 302    Basic Metabolic Panel: Recent Labs  Lab 10/16/21 0605 10/17/21 0600 10/19/21 0548 10/20/21 0320 10/21/21 0406 10/22/21 0536  NA  --  135 133* 138 140 139  K  --  3.6 3.9 3.3* 3.7 3.7  CL  --  101 100 107 111 109  CO2  --  28 25 27 23 25   GLUCOSE  --  110* 112* 136* 127* 144*  BUN  --  15 15 16 16 15   CREATININE  --  0.73 0.52* 0.52* 0.48* 0.51*  CALCIUM  --  8.3* 8.7* 8.8* 8.5* 8.5*  MG 2.1  --  1.9  --  1.9  --   PHOS 3.1  --   --   --  3.3  --     CBG: Recent Labs  Lab 10/22/21 0013 10/22/21 0458 10/22/21 0743 10/22/21 1125 10/22/21 1549  GLUCAP 118* 143* 151* 141* 134*    Recent Results (from the past 240 hour(s))  SARS  Coronavirus 2 by RT PCR (hospital order, performed in [facility] hospital lab) *cepheid single result test* Anterior Nasal Swab     Status: None   Collection Time: 10/13/21  7:13 AM   Specimen: Anterior Nasal Swab  Result Value Ref Range Status   SARS Coronavirus 2 by RT PCR NEGATIVE NEGATIVE Final    Comment: (NOTE) SARS-CoV-2 target nucleic acids are NOT DETECTED.  The SARS-CoV-2 RNA is generally detectable in upper and lower respiratory specimens during the acute phase of infection. The lowest concentration of SARS-CoV-2 viral copies this assay can detect is 250 copies / mL. A negative result does not preclude SARS-CoV-2 infection and  should not be used as the sole basis for treatment or other patient management decisions.  A negative result may occur with improper specimen collection / handling, submission of specimen other than nasopharyngeal swab, presence of viral mutation(s) within the areas targeted by this assay, and inadequate number of viral copies (<250 copies / mL). A negative result must be combined with clinical observations, patient history, and epidemiological information.  Fact Sheet for Patients:   https://www.patel.info/  Fact Sheet for Healthcare Providers: https://hall.com/  This test is not yet approved or  cleared by the [place] FDA and has been authorized for detection and/or diagnosis of SARS-CoV-2 by FDA under an Emergency Use Authorization (EUA).  This EUA will remain in effect (meaning this test can be used) for the duration of the COVID-19 declaration under Section 564(b)(1) of the Act, 21 U.S.C. section 360bbb-3(b)(1), unless the authorization is terminated or revoked sooner.  Performed at [facility], [address] [place], [place] [address]   Culture, Respiratory w Gram Stain     Status: None (Preliminary result)   Collection Time: 10/20/21  3:00 PM   Specimen: Tracheal Aspirate; Respiratory  Result Value Ref Range Status   Specimen Description   Final    TRACHEAL ASPIRATE Performed at [facility], [address], [place], [place] [address]    Special Requests   Final    NONE Performed at [address] [place], [place] [address]    Gram Stain   Final    NO SQUAMOUS EPITHELIAL CELLS SEEN FEW WBC SEEN FEW GRAM POSITIVE COCCI    Culture   Final    CULTURE REINCUBATED FOR BETTER GROWTH Performed at [facility], [place] [address], [place], [place] [address]    Report Status PENDING  Incomplete     Radiology Studies: DG CHEST PORT 1 VIEW  Result Date: 10/22/2021 CLINICAL DATA:  Respiratory distress.   Intubated patient. EXAM: PORTABLE CHEST 1 VIEW COMPARISON:  Radiographs 10/21/2021 and 10/20/2021.  CT 08/30/2020. FINDINGS: 0633 hours. Two views obtained. The endotracheal and enteric tubes are unchanged in position. The heart size and mediastinal contours are stable with aortic atherosclerosis. Mild residual airspace disease medially at the left lung base is unchanged from yesterday. Numerous calcified granulomas are present in both lungs. There is no pleural effusion or pneumothorax. No acute osseous findings are seen. Posttraumatic and post surgical deformities of the right shoulder are noted. IMPRESSION: No significant change from yesterday in residual left basilar airspace disease. Stable support system. Electronically Signed   By: Richardean Sale M.D.   On: 10/22/2021 09:07   DG CHEST PORT 1 VIEW  Result Date: 10/21/2021 CLINICAL DATA:  Dysarthria/copd/htn/smoker EXAM: PORTABLE CHEST - 1 VIEW COMPARISON:  10/20/2021 FINDINGS: Endotracheal tube and gastric tube stable position. Improved aeration of the left lung base with  some residual linear scarring or atelectasis. Scattered calcified granulomas bilaterally are again noted. Heart size and mediastinal contours are within normal limits. No effusion. Orthopedic hardware in the proximal right humerus. IMPRESSION: Improving left lower lung aeration. Stable support hardware Electronically Signed   By: Corlis Leak M.D.   On: 10/21/2021 07:57    Scheduled Meds:  Chlorhexidine Gluconate Cloth  6 each Topical Daily   cholecalciferol  1,000 Units Per Tube Daily   enoxaparin (LOVENOX) injection  40 mg Subcutaneous Q24H   feeding supplement (PROSource TF)  45 mL Per Tube TID   folic acid  1 mg Intravenous Daily   free water  200 mL Per Tube Q4H   ipratropium-albuterol  3 mL Nebulization Q6H   nicotine  21 mg Transdermal Daily   mouth rinse  15 mL Mouth Rinse Q2H   pantoprazole sodium  40 mg Per Tube Daily   Continuous Infusions:  sodium chloride  Stopped (10/22/21 0802)   ampicillin-sulbactam (UNASYN) IV 3 g (10/22/21 1440)   dexmedetomidine (PRECEDEX) IV infusion 1 mcg/kg/hr (10/22/21 1518)   feeding supplement (OSMOLITE 1.2 CAL) 50 mL/hr at 10/22/21 1440    LOS: 9 days   Shon Hale, MD How to contact the [facility] Attending or Consulting provider 7A - 7P or covering provider during after hours 7P -7A, for this patient?  Check the care team in [facility] and look for a) attending/consulting TRH provider listed and b) the [facility] team listed Log into [email or web address] and use [place]'s universal password to access. If you do not have the password, please contact the hospital operator. Locate the [facility] provider you are looking for under [facility] and page to a number that you can be directly reached. If you still have difficulty reaching the provider, please page the [facility] (Director on Call) for the Hospitalists listed on amion for assistance.  10/22/2021, 4:23 PM

## 2021-10-22 NOTE — Progress Notes (Signed)
Pharmacy Antibiotic Note  Larry [name] a 71 y.o. male admitted on 10/22/2021 with aspiration pneumonia.  Pharmacy has been consulted for unasyn dosing.  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   Hypertension    Shingles    Shortness of breath dyspnea    SOB with exertion   Thyroid disease    hypothyroidism    Allergies:  No Known Allergies  Filed Weights   10/20/21 0513 10/21/21 0500 10/22/21 0500  Weight: 61.1 kg (134 lb 11.2 oz) 65 kg (143 lb 4.8 oz) 67.3 kg (148 lb 5.9 oz)       Latest Ref Rng & Units 10/22/2021    5:36 AM 10/21/2021    4:06 AM 10/20/2021    3:20 AM  CBC  WBC 4.0 - 10.5 K/uL 4.8  6.1  7.5   Hemoglobin 13.0 - 17.0 g/dL 8.6  8.5  76.7   Hematocrit 39.0 - 52.0 % 26.3  26.3  31.2   Platelets 150 - 400 K/uL 302  296  298      Estimated Creatinine Clearance: 81.8 mL/min (A) (by C-G formula based on SCr of 0.51 mg/dL (L)).  Antibiotics Given (last 72 hours)     Date/Time Action Medication Dose Rate   10/19/21 1353 New Bag/Given   Ampicillin-Sulbactam (UNASYN) 3 g in sodium chloride 0.9 % 100 mL IVPB 3 g 200 mL/hr   10/19/21 2144 New Bag/Given   Ampicillin-Sulbactam (UNASYN) 3 g in sodium chloride 0.9 % 100 mL IVPB 3 g 200 mL/hr   10/20/21 0215 New Bag/Given   Ampicillin-Sulbactam (UNASYN) 3 g in sodium chloride 0.9 % 100 mL IVPB 3 g 200 mL/hr   10/20/21 0832 New Bag/Given   Ampicillin-Sulbactam (UNASYN) 3 g in sodium chloride 0.9 % 100 mL IVPB 3 g 200 mL/hr   10/20/21 1426 New Bag/Given   Ampicillin-Sulbactam (UNASYN) 3 g in sodium chloride 0.9 % 100 mL IVPB 3 g 200 mL/hr   10/20/21 2131 New Bag/Given   Ampicillin-Sulbactam (UNASYN) 3 g in sodium chloride 0.9 % 100 mL IVPB 3 g 200 mL/hr   10/21/21 0206 New Bag/Given   Ampicillin-Sulbactam (UNASYN) 3 g in sodium chloride 0.9 % 100 mL IVPB 3 g 200 mL/hr    10/21/21 2094 New Bag/Given   Ampicillin-Sulbactam (UNASYN) 3 g in sodium chloride 0.9 % 100 mL IVPB 3 g 200 mL/hr   10/21/21 1435 New Bag/Given   Ampicillin-Sulbactam (UNASYN) 3 g in sodium chloride 0.9 % 100 mL IVPB 3 g 200 mL/hr   10/21/21 2016 New Bag/Given   Ampicillin-Sulbactam (UNASYN) 3 g in sodium chloride 0.9 % 100 mL IVPB 3 g 200 mL/hr   10/22/21 0140 New Bag/Given   Ampicillin-Sulbactam (UNASYN) 3 g in sodium chloride 0.9 % 100 mL IVPB 3 g 200 mL/hr   10/22/21 0802 New Bag/Given   Ampicillin-Sulbactam (UNASYN) 3 g in sodium chloride 0.9 % 100 mL IVPB 3 g 200 mL/hr       Antimicrobials this admission:  unasyn 10/19/2021  >>   Microbiology results: 10/19/2021 Resp Cx: pneding  Thank you for allowing pharmacy to be a part of this patient's care.  Tad Moore, PharmD Clinical Pharmacist

## 2021-10-23 ENCOUNTER — Inpatient Hospital Stay (HOSPITAL_COMMUNITY): Payer: Medicare HMO

## 2021-10-23 DIAGNOSIS — R52 Pain, unspecified: Secondary | ICD-10-CM | POA: Diagnosis not present

## 2021-10-23 LAB — GLUCOSE, CAPILLARY
Glucose-Capillary: 118 mg/dL — ABNORMAL HIGH (ref 70–99)
Glucose-Capillary: 118 mg/dL — ABNORMAL HIGH (ref 70–99)
Glucose-Capillary: 119 mg/dL — ABNORMAL HIGH (ref 70–99)
Glucose-Capillary: 122 mg/dL — ABNORMAL HIGH (ref 70–99)
Glucose-Capillary: 124 mg/dL — ABNORMAL HIGH (ref 70–99)
Glucose-Capillary: 137 mg/dL — ABNORMAL HIGH (ref 70–99)

## 2021-10-23 LAB — COMPREHENSIVE METABOLIC PANEL
ALT: 60 U/L — ABNORMAL HIGH (ref 0–44)
AST: 49 U/L — ABNORMAL HIGH (ref 15–41)
Albumin: 2 g/dL — ABNORMAL LOW (ref 3.5–5.0)
Alkaline Phosphatase: 182 U/L — ABNORMAL HIGH (ref 38–126)
Anion gap: 6 (ref 5–15)
BUN: 22 mg/dL (ref 8–23)
CO2: 25 mmol/L (ref 22–32)
Calcium: 8.3 mg/dL — ABNORMAL LOW (ref 8.9–10.3)
Chloride: 107 mmol/L (ref 98–111)
Creatinine, Ser: 0.49 mg/dL — ABNORMAL LOW (ref 0.61–1.24)
GFR, Estimated: >60 mL/min (ref >60)
Glucose, Bld: 122 mg/dL — ABNORMAL HIGH (ref 70–99)
Potassium: 4.1 mmol/L (ref 3.5–5.1)
Sodium: 138 mmol/L (ref 135–145)
Total Bilirubin: 0.6 mg/dL (ref 0.3–1.2)
Total Protein: 5.1 g/dL — ABNORMAL LOW (ref 6.5–8.1)

## 2021-10-23 LAB — CULTURE, RESPIRATORY W GRAM STAIN
Culture: NORMAL
Gram Stain: NONE SEEN

## 2021-10-23 LAB — PROTIME-INR
INR: 1.1 (ref 0.8–1.2)
Prothrombin Time: 13.8 seconds (ref 11.4–15.2)

## 2021-10-23 MED ORDER — DOCUSATE SODIUM 50 MG/5ML PO LIQD
200.0000 mg | Freq: Every day | ORAL | Status: DC
  Administered 2021-10-24 – 2021-10-26 (×3): 200 mg
  Filled 2021-10-23 (×5): qty 20

## 2021-10-23 MED ORDER — OXYCODONE HCL 5 MG PO TABS
2.5000 mg | ORAL_TABLET | ORAL | Status: DC | PRN
  Administered 2021-10-23 – 2021-10-27 (×5): 2.5 mg
  Filled 2021-10-23 (×5): qty 1

## 2021-10-23 MED ORDER — ONDANSETRON HCL 4 MG/2ML IJ SOLN: 4.0000 mg | Freq: Four times a day (QID) | INTRAMUSCULAR | Status: DC | PRN

## 2021-10-23 MED ORDER — ONDANSETRON HCL 4 MG PO TABS: 4.0000 mg | ORAL_TABLET | Freq: Four times a day (QID) | ORAL | Status: DC | PRN

## 2021-10-23 NOTE — Progress Notes (Signed)
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  He admits to drinking one half of 1/5 of whiskey daily.  He is mostly complaining of pain to his mid and lower back.  His home was noted to be in terrible condition and he was covered and bedbugs and all of his clothing had to be removed and he had to be bathed in the ED.  He was sent for work-up in the ED including CT scans and x-rays and noted to have displaced rib fractures in the posterior left 11th and 12th ribs and mildly displaced left 10th rib fracture and severe hepatic steatosis and left L2 transverse process acute fracture.  He is notably in severe pain and noted to be hyponatremic as well.  Admission was requested for pain management and possible placement.  He has severe problems with frequent falling likely related to alcohol intoxication.   Assessment and Plan: 1) acute hypoxic respiratory failure--- in the setting of aspiration pneumonia and full-blown delirium tremens -Intubated 10/19/2021 -Pulmonary consult appreciated -Currently on Precedex for sedation and as needed fentanyl -10/23/21--- did better with sedation vacation -Not doing well with CPAP trial -Is back on the vent sedated  2)Uncontrolled pain/multiple rib fractures/Lumbar transverse process fracture - TLSO brace to assist with severe rib fracture pain when he comes off vent -Opiates as above  3)Aspiration pneumonia with acute hypoxic respiratory  failure -In the setting of alcohol abuse/delirium tremens  -Continue IV Unasyn (started on 10/19/2021) -c/n  bronchodilators, -Currently intubated and sedated  4)Delirium tremens----currently intubated and sedated - transitioned Precedex from propofol on 10/21/2021  5)Hypomagnesemia/hyponatremia -- Hydrate and replace lites  6)Macrocytic anemia - serum B12  Is 973, low folic acid level being repleted  7)Bedbug infestation - Reportedly noted to be living in substandard horrible conditions according to [facility] report - He has been thoroughly cleansed and clothing changed out in the emergency department - [facility] consultation with social work for APS referral  8)Severe hepatic steatosis/alcoholic hepatitis - Secondary to chronic alcohol abuse - He should follow-up with GI on outpatient basis for ongoing surveillance  9)Failure to thrive in adult/severe protein caloric malnutrition - Patient is severely deconditioned and malnourished and living in substandard conditions and we have asked for a physical therapy consultation and TOC social work consultation for possible APS referral and placement in a safe for home environment. -Dietitian consult appreciated, at risk for refeeding syndrome  10)Pneumoconiosis, coal, workers' [facility]) - Patient continues smoking cigarettes and we have ordered nicotine patch for him as well as bronchodilators as needed  11)HTN--- had hypotension postintubation, required Levophed for pressure support -Off Levophed at this time -BP stable  12)FEN--tolerating Osmolite tube feed with water flushes  Falls frequently - This is most likely secondary to chronic alcohol intoxication in addition to substandard living conditions - PT evaluation recommending SNF rehab  - Fall precautions recommended --working on SNF rehab placement   CRITICAL CARE Performed by: Shon Hale   Total critical care time: 41 minutes  Critical care  time was exclusive of separately  billable procedures and treating other patients.  Critical care was necessary to treat or prevent imminent or life-threatening deterioration. -Remains intubated and sedated--Precedex/fentanyl -Vent--PRVC /35%/5/18/640 TV  Critical care was time spent personally by me on the following activities: development of treatment plan with patient and/or surrogate as well as nursing, discussions with consultants, evaluation of patient's response to treatment, examination of patient, obtaining history from patient or surrogate, ordering and performing treatments and interventions, ordering and review of laboratory studies, ordering and review of radiographic studies, pulse oximetry and re-evaluation of patient's condition.   DVT prophylaxis: enoxaparin Code Status: full code Family Communication: none present  Disposition: Currently intubated and sedated Not medically ready for discharge Status is: Inpatient Remains inpatient appropriate because: IV fluids, electrolyte replacement , intubated and sedated   Consultants: palliative care, PT /pulmonary critical care  Procedures:  10/19/2021 intubation Antimicrobials:  Unasyn started on 10/19/2021  Subjective: Intubated and sedated -Did not do well with CPAP trial -Did better with sedation vacation today -Had BM  Objective: Vitals:   10/23/21 1000 10/23/21 1117 10/23/21 1300 10/23/21 1330  BP: (!) 141/77 (!) 110/50 (!) 115/48 (!) 107/50  Pulse: (!) 120 (!) 107 93 88  Resp:  (!) 29 (!) 21 20  Temp:  99.4 F (37.4 C)    TempSrc:  Axillary    SpO2: 95% 96% 97% 98%  Weight:      Height:        Intake/Output Summary (Last 24 hours) at 10/23/2021 1419 Last data filed at 10/23/2021 1336 Gross per 24 hour  Intake 2604.42 ml  Output 1525 ml  Net 1079.42 ml   Filed Weights   10/21/21 0500 10/22/21 0500 10/23/21 0400  Weight: 65 kg 67.3 kg 68.1 kg   Physical Exam  Gen:-Intubated and sedated  HEENT:- [place].AT, No sclera icterus HEENT-OG and ET  tube Lungs-fair, symmetrical air movement, no wheezing  CV- S1, S2 normal, RRR Abd-  +ve B.Sounds, Abd Soft, No tenderness,    Extremity/Skin:- No  edema,   good pedal pulses  Neuro-Psych-intubated and sedated GU--- Foley with clear urine  Data Reviewed: I have personally reviewed following labs and imaging studies  CBC: Recent Labs  Lab 10/19/21 0548 10/20/21 0320 10/21/21 0406 10/22/21 0536  WBC 6.1 7.5 6.1 4.8  HGB 10.1* 10.2* 8.5* 8.6*  HCT 28.6* 31.2* 26.3* 26.3*  MCV 100.7* 104.7* 104.8* 103.5*  PLT 278 298 296 302    Basic Metabolic Panel: Recent Labs  Lab 10/19/21 0548 10/20/21 0320 10/21/21 0406 10/22/21 0536 10/23/21 0445  NA 133* 138 140 139 138  K 3.9 3.3* 3.7 3.7 4.1  CL 100 107 111 109 107  CO2 GLUCOSE 112* 136* 127* 144* 122*  BUN CREATININE 0.52* 0.52* 0.48* 0.51* 0.49*  CALCIUM 8.7* 8.8* 8.5* 8.5* 8.3*  MG 1.9  --  1.9  --   --   PHOS  --   --  3.3  --   --     CBG: Recent Labs  Lab 10/22/21 2003 10/23/21 0013 10/23/21 0430 10/23/21 0708 10/23/21 1117  GLUCAP 126* 122* 124* 137* 119*    Recent Results (from the past 240 hour(s))  Culture, Respiratory w Gram Stain     Status: None   Collection Time: 10/20/21  3:00 PM   Specimen: Tracheal Aspirate; Respiratory  Result Value Ref Range Status   Specimen Description   Final  TRACHEAL ASPIRATE Performed at [facility], [address], [place], [place] [address]    Special Requests   Final    NONE Performed at [facility], [address], [place], [place] [address]    Gram Stain   Final    NO SQUAMOUS EPITHELIAL CELLS SEEN FEW WBC SEEN FEW GRAM POSITIVE COCCI    Culture   Final    MODERATE Normal respiratory flora-no Staph aureus or Pseudomonas seen Performed at [facility], [address] [place], [place] [address]    Report Status 10/23/2021 FINAL  Final     Radiology Studies: DG CHEST PORT 1 VIEW  Result Date: 10/23/2021 CLINICAL  DATA:  Dyspnea. History of hypertension and chronic obstructive pulmonary disease. EXAM: PORTABLE CHEST 1 VIEW COMPARISON:  Multiple prior radiographs, most recently 10/22/2021 and 10/21/2021. Chest CT 08/30/2020. FINDINGS: 0801 hours. The endotracheal and enteric tubes are unchanged in position. The heart size and mediastinal contours are stable. Left lower lobe airspace disease appears slightly increased from yesterday, although improved from 10/20/2021. The lungs are otherwise clear aside from numerous calcified granulomas. No evidence of pneumothorax or significant pleural effusion. 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 IMPRESSION: Slightly increased retrocardiac opacity since yesterday. Otherwise stable chest. Electronically Signed   By: Carey Bullocks M.D.   On: 10/23/2021 10:37   DG CHEST PORT 1 VIEW  Result Date: 10/22/2021 CLINICAL DATA:  Respiratory distress.  Intubated patient. EXAM: PORTABLE CHEST 1 VIEW COMPARISON:  Radiographs 10/21/2021 and 10/20/2021.  CT 08/30/2020. FINDINGS: 0633 hours. Two views obtained. The endotracheal and enteric tubes are unchanged in position. The heart size and mediastinal contours are stable with aortic atherosclerosis. Mild residual airspace disease medially at the left lung base is unchanged from yesterday. Numerous calcified granulomas are present in both lungs. There is no pleural effusion or pneumothorax. No acute osseous findings are seen. Posttraumatic and post surgical deformities of the right shoulder are noted. IMPRESSION: No significant change from yesterday in residual left basilar airspace disease. Stable support system. Electronically Signed   By: Carey Bullocks M.D.   On: 10/22/2021 09:07    Scheduled Meds:  Chlorhexidine Gluconate Cloth  6 each Topical Daily   cholecalciferol  1,000 Units Per Tube Daily   docusate  200 mg Per Tube BID   enoxaparin (LOVENOX) injection  40 mg Subcutaneous Q24H   feeding supplement (PROSource TF)  45  mL Per Tube TID   folic acid  1 mg Intravenous Daily   free water  200 mL Per Tube Q4H   ipratropium-albuterol  3 mL Nebulization Q6H   nicotine  21 mg Transdermal Daily   mouth rinse  15 mL Mouth Rinse Q2H   pantoprazole sodium  40 mg Per Tube Daily   Continuous Infusions:  sodium chloride Stopped (10/22/21 0802)   ampicillin-sulbactam (UNASYN) IV Stopped (10/23/21 1310)   dexmedetomidine (PRECEDEX) IV infusion 0.2 mcg/kg/hr (10/23/21 1336)   feeding supplement (OSMOLITE 1.2 CAL) 50 mL/hr at 10/23/21 1336    LOS: 10 days   Shon Hale, MD How to contact the [facility] Attending or Consulting provider 7A - 7P or covering provider during after hours 7P -7A, for this patient?  Check the care team in [facility] and look for a) attending/consulting TR[facility] provider listed and b) the [facility] team listed Log into [email or web address] and use [place]'s universal password to access. If you do not have the password, please contact the hospital operator. Locate the [facility] provider you are looking for under  [facility] and page to a number that you can be directly reached. If you still have difficulty reaching the provider, please page the [facility] (Director on Call) for the Hospitalists listed on amion for assistance.  10/23/2021, 2:19 PM

## 2021-10-24 ENCOUNTER — Inpatient Hospital Stay (HOSPITAL_COMMUNITY): Payer: Medicare HMO

## 2021-10-24 DIAGNOSIS — J9602 Acute respiratory failure with hypercapnia: Secondary | ICD-10-CM | POA: Diagnosis not present

## 2021-10-24 DIAGNOSIS — T17908D Unspecified foreign body in respiratory tract, part unspecified causing other injury, subsequent encounter: Secondary | ICD-10-CM | POA: Diagnosis not present

## 2021-10-24 DIAGNOSIS — R52 Pain, unspecified: Secondary | ICD-10-CM | POA: Diagnosis not present

## 2021-10-24 DIAGNOSIS — J9601 Acute respiratory failure with hypoxia: Secondary | ICD-10-CM | POA: Diagnosis not present

## 2021-10-24 DIAGNOSIS — Z7189 Other specified counseling: Secondary | ICD-10-CM | POA: Diagnosis not present

## 2021-10-24 DIAGNOSIS — Z515 Encounter for palliative care: Secondary | ICD-10-CM | POA: Diagnosis not present

## 2021-10-24 LAB — GLUCOSE, CAPILLARY
Glucose-Capillary: 115 mg/dL — ABNORMAL HIGH (ref 70–99)
Glucose-Capillary: 115 mg/dL — ABNORMAL HIGH (ref 70–99)
Glucose-Capillary: 124 mg/dL — ABNORMAL HIGH (ref 70–99)
Glucose-Capillary: 125 mg/dL — ABNORMAL HIGH (ref 70–99)
Glucose-Capillary: 139 mg/dL — ABNORMAL HIGH (ref 70–99)
Glucose-Capillary: 92 mg/dL (ref 70–99)

## 2021-10-24 LAB — CBC
HCT: 26.9 % — ABNORMAL LOW (ref 39.0–52.0)
Hemoglobin: 8.8 g/dL — ABNORMAL LOW (ref 13.0–17.0)
MCH: 34 pg (ref 26.0–34.0)
MCHC: 32.7 g/dL (ref 30.0–36.0)
MCV: 103.9 fL — ABNORMAL HIGH (ref 80.0–100.0)
Platelets: 305 10*3/uL (ref 150–400)
RBC: 2.59 MIL/uL — ABNORMAL LOW (ref 4.22–5.81)
RDW: 14.6 % (ref 11.5–15.5)
WBC: 6.6 10*3/uL (ref 4.0–10.5)
nRBC: 0 % (ref 0.0–0.2)

## 2021-10-24 LAB — BASIC METABOLIC PANEL
Anion gap: 5 (ref 5–15)
BUN: 19 mg/dL (ref 8–23)
CO2: 24 mmol/L (ref 22–32)
Calcium: 8.2 mg/dL — ABNORMAL LOW (ref 8.9–10.3)
Chloride: 107 mmol/L (ref 98–111)
Creatinine, Ser: 0.5 mg/dL — ABNORMAL LOW (ref 0.61–1.24)
GFR, Estimated: >60 mL/min (ref >60)
Glucose, Bld: 150 mg/dL — ABNORMAL HIGH (ref 70–99)
Potassium: 3.7 mmol/L (ref 3.5–5.1)
Sodium: 136 mmol/L (ref 135–145)

## 2021-10-24 NOTE — Progress Notes (Signed)
NAME:  Larry [name], MRN:  [id number], DOB:  02-Nov-1950, LOS: 11 ADMISSION DATE:  10/13/2021, CONSULTATION DATE:  10/24/2021  REFERRING MD:  Marisa Severin TRH , CHIEF COMPLAINT: Respiratory failure  History of Present Illness:  71 year old EtOH user, smoker BIBEMS on 6/22 for generalized weakness and multiple falls over the past several months.  He had pain in his mid to lower back .  His home was in a terrible condition, there were bedbugs.  He was severely malnourished initial imaging showed displaced rib fracture posterior left 10th, 11th and 12th ribs and left T2 transverse process fracture. Initial labs were significant for hyponatremia. Social work and APS referrals were made On 6/28 he was noted to be confused, disoriented with tremors consistent with DTs.  CIWA protocol was started and he was transferred to stepdown.  He was noted to be febrile, coughing and dyspnea consistent with aspiration.  He required intubation and mechanical ventilation, hypotensive requiring fluid bolus and Levophed drip  Pertinent  Medical History  EtOH abuse Hypertension Pneumoconiosis -coalminer's disease  Significant Hospital Events: Including procedures, antibiotic start and stop dates in addition to other pertinent events   6/29 ETT >> 6/29 ET culture : few wbc's, nl flora    Scheduled Meds:  Chlorhexidine Gluconate Cloth  6 each Topical Daily   cholecalciferol  1,000 Units Per Tube Daily   docusate  200 mg Per Tube Daily   enoxaparin (LOVENOX) injection  40 mg Subcutaneous Q24H   feeding supplement (PROSource TF)  45 mL Per Tube TID   folic acid  1 mg Intravenous Daily   free water  200 mL Per Tube Q4H   ipratropium-albuterol  3 mL Nebulization Q6H   nicotine  21 mg Transdermal Daily   mouth rinse  15 mL Mouth Rinse Q2H   pantoprazole sodium  40 mg Per Tube Daily   Continuous Infusions:  sodium chloride Stopped (10/22/21 0802)   ampicillin-sulbactam (UNASYN) IV 3 g (10/24/21 0800)    dexmedetomidine (PRECEDEX) IV infusion 0.5 mcg/kg/hr (10/24/21 0639)   feeding supplement (OSMOLITE 1.2 CAL) 50 mL/hr at 10/24/21 0639   PRN Meds:.acetaminophen, albuterol, fentaNYL (SUBLIMAZE) injection, ibuprofen, ondansetron **OR** ondansetron (ZOFRAN) IV, mouth rinse, oxyCODONE     Interim History / Subjective:  Not able to tol PSV this am/ air trapping on vent at rate 18 breathing just over that rate but does follow commands on precedex @ 3 mcg  Objective   Blood pressure 103/67, pulse 87, temperature 97.7 F (36.5 C), temperature source Axillary, resp. rate (!) 44, height 6\' 2"  (1.88 m), weight 70.6 kg, SpO2 100 %.    Vent Mode: CPAP;PSV FiO2 (%):  [35 %-40 %] 40 % Set Rate:  [18 bmp] 18 bmp Vt Set:  [640 mL] 640 mL PEEP:  [5 cmH20] 5 cmH20 Pressure Support:  [10 cmH20] 10 cmH20 Plateau Pressure:  [14 cmH20-24 cmH20] 16 cmH20   Intake/Output Summary (Last 24 hours) at 10/24/2021 0920 Last data filed at 10/24/2021 12/25/2021 Gross per 24 hour  Intake 1882.52 ml  Output 1200 ml  Net 682.52 ml   Filed Weights   10/22/21 0500 10/23/21 0400 10/24/21 0500  Weight: 67.3 kg 68.1 kg 70.6 kg    Examination:  Tmax:  100.7  (new spike)  General appearance:    as above/ no increased wob back on PRVC p failing PSV this am    No jvd Oropharynx et/ og  Neck supple Lungs with distant bs bilaterally RRR no s3 or or sign  murmur Abd mildly distended limited  excursion  Extr warm with no edema or clubbing noted Neuro  Sensorium wiggles both toes to command,  no apparent motor deficits     I personally reviewed images and agree with radiology impression as follows:  CXR:   portable 7/3 1.  Stable lines and tubes. 2.  Stable ventilation with probable small bilateral pleural effusions superimposed on chronic lung disease.   Resolved Hospital Problem list   Hyponatremia  Assessment & Plan:  Acute hypoxic respiratory failure Aspiration pneumonia - Air trapping on vent 7/3 and failed  PSV wean >>>Continue duoneb   >>>Continue Unasyn with no pathologic org on ET culture from 6/29     Left-sided rib fractures, vertebral fracture  acute pain -Consider nonnarcotics eg clonidine for hbp/sedationa and nsaids  Acute metabolic encephalopathy Delirium tremens >>>for now main on Precedex and intermittent fentanyl for goal RASS 0 to -1 but wean to clonidine likely to be needed  >>> Versed as needed for breakthrough agitation   Severe proteil cal manutrition present on admit - note albumin very low  >>> tf per [facility]/nutrition    Best Practice (right click and "Reselect all SmartList Selections" daily)    Per [facility]   Labs   CBC: Recent Labs  Lab 10/19/21 0548 10/20/21 0320 10/21/21 0406 10/22/21 0536 10/24/21 0419  WBC 6.1 7.5 6.1 4.8 6.6  HGB 10.1* 10.2* 8.5* 8.6* 8.8*  HCT 28.6* 31.2* 26.3* 26.3* 26.9*  MCV 100.7* 104.7* 104.8* 103.5* 103.9*  PLT 278 298 296 302 305    Basic Metabolic Panel: Recent Labs  Lab 10/19/21 0548 10/20/21 0320 10/21/21 0406 10/22/21 0536 10/23/21 0445 10/24/21 0419  NA 133* 138 140 139 138 136  K 3.9 3.3* 3.7 3.7 4.1 3.7  CL 100 107 111 109 107 107  CO2 25 27 23 25 25 24   GLUCOSE 112* 136* 127* 144* 122* 150*  BUN 15 16 16 15 22 19   CREATININE 0.52* 0.52* 0.48* 0.51* 0.49* 0.50*  CALCIUM 8.7* 8.8* 8.5* 8.5* 8.3* 8.2*  MG 1.9  --  1.9  --   --   --   PHOS  --   --  3.3  --   --   --    GFR: Estimated Creatinine Clearance: 85.8 mL/min (A) (by C-G formula based on SCr of 0.5 mg/dL (L)). Recent Labs  Lab 10/20/21 0320 10/21/21 0406 10/22/21 0536 10/24/21 0419  WBC 7.5 6.1 4.8 6.6    Liver Function Tests: Recent Labs  Lab 10/20/21 0320 10/21/21 0406 10/22/21 0536 10/23/21 0445  AST 21  --  82* 49*  ALT 23  --  73* 60*  ALKPHOS 87  --  172* 182*  BILITOT 0.5  --  0.5 0.6  PROT 5.8*  --  5.4* 5.1*  ALBUMIN 2.4* 2.1* 2.0* 2.0*   No results for input(s): "LIPASE", "AMYLASE" in the last 168 hours. No  results for input(s): "AMMONIA" in the last 168 hours.  ABG    Component Value Date/Time   PHART 7.44 10/20/2021 0520   PCO2ART 44 10/20/2021 0520   PO2ART 90 10/20/2021 0520   HCO3 29.9 (H) 10/20/2021 0520   TCO2 23 02/28/2018 1643   O2SAT 100 10/20/2021 0520     Coagulation Profile: Recent Labs  Lab 10/23/21 0445  INR 1.1    Cardiac Enzymes: No results for input(s): "CKTOTAL", "CKMB", "CKMBINDEX", "TROPONINI" in the last 168 hours.  HbA1C: No results found for: "HGBA1C"  CBG: Recent Labs  Lab 10/23/21 1117 10/23/21 1635 10/23/21 2224 10/24/21 0127 10/24/21 0750  GLUCAP 119* 118* 118* 125* 92      The patient is critically ill with multiple organ systems failure and requires high complexity decision making for assessment and support, frequent evaluation and titration of therapies, application of advanced monitoring technologies and extensive interpretation of multiple databases. Critical Care Time devoted to patient care services described in this note is 45  minutes.   Christinia Gully, MD Pulmonary and [place] [phone]   After 7:00 pm call Elink  [phone]

## 2021-10-24 NOTE — Progress Notes (Signed)
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  He admits to drinking one half of 1/5 of whiskey daily.  He is mostly complaining of pain to his mid and lower back.  His home was noted to be in terrible condition and he was covered and bedbugs and all of his clothing had to be removed and he had to be bathed in the ED.  He was sent for work-up in the ED including CT scans and x-rays and noted to have displaced rib fractures in the posterior left 11th and 12th ribs and mildly displaced left 10th rib fracture and severe hepatic steatosis and left L2 transverse process acute fracture.  He is notably in severe pain and noted to be hyponatremic as well.  Admission was requested for pain management and possible placement.  He has severe problems with frequent falling likely related to alcohol intoxication.   Assessment and Plan: 1) acute hypoxic respiratory failure--- in the setting of aspiration pneumonia and full-blown delirium tremens -Intubated 10/19/2021 -Pulmonary consult appreciated -Currently on Precedex for sedation and as needed fentanyl -10/24/21--- more awake and following commands on Precedex -Not doing well with CPAP trial -Is back on the vent  2)Uncontrolled pain/multiple rib fractures/Lumbar transverse process fracture - TLSO brace to assist with severe rib fracture pain when he comes off vent -Opiates as above  3)Aspiration pneumonia with acute hypoxic  respiratory failure -In the setting of alcohol abuse/delirium tremens  -Continue IV Unasyn (started on 10/19/2021) -c/n  bronchodilators, -Currently intubated and sedated  4)Delirium tremens----currently intubated and sedated - transitioned Precedex from propofol on 10/21/2021  5)Hypomagnesemia/hyponatremia -- Hydrate and replace lites  6)Macrocytic anemia - serum B12  Is 973, low folic acid level being repleted  7)Bedbug infestation - Reportedly noted to be living in substandard horrible conditions according to [facility] report - He has been thoroughly cleansed and clothing changed out in the emergency department - [facility] consultation with social work for APS referral  8)Severe hepatic steatosis/alcoholic hepatitis - Secondary to chronic alcohol abuse - He should follow-up with GI on outpatient basis for ongoing surveillance  9)Failure to thrive in adult/severe protein caloric malnutrition - Patient is severely deconditioned and malnourished and living in substandard conditions and we have asked for a physical therapy consultation and TOC social work consultation for possible APS referral and placement in a safe for home environment. -Dietitian consult appreciated, at risk for refeeding syndrome  10)Pneumoconiosis, coal, workers' [facility]) - Patient continues smoking cigarettes and we have ordered nicotine patch for him as well as bronchodilators as needed  11)HTN--- had hypotension postintubation, required Levophed for pressure support -Off Levophed  -BP stable  12)FEN--tolerating Osmolite tube feed with water flushes  Falls frequently - This is most likely secondary to chronic alcohol intoxication in addition to substandard living conditions - PT evaluation recommending SNF rehab  - Fall precautions recommended --working on SNF rehab placement   CRITICAL CARE Performed by: Shon Hale   Total critical care time: 46 minutes  Critical care time  was exclusive of separately  billable procedures and treating other patients.  Critical care was necessary to treat or prevent imminent or life-threatening deterioration. -Remains intubated and sedated--Precedex/fentanyl -Vent--PRVC /35%/5/18/640 TV  Critical care was time spent personally by me on the following activities: development of treatment plan with patient and/or surrogate as well as nursing, discussions with consultants, evaluation of patient's response to treatment, examination of patient, obtaining history from patient or surrogate, ordering and performing treatments and interventions, ordering and review of laboratory studies, ordering and review of radiographic studies, pulse oximetry and re-evaluation of patient's condition.   DVT prophylaxis: enoxaparin Code Status: full code Family Communication: none present  Disposition: Currently intubated - Not medically ready for discharge Status is: Inpatient Remains inpatient appropriate because: IV fluids, electrolyte replacement , intubated and sedated   Consultants: palliative care, PT /pulmonary critical care  Procedures:  10/19/2021 intubation Antimicrobials:  Unasyn started on 10/19/2021  Subjective: Intubated and more awake and following commands on Precedex -Did not do well with CPAP trial No fevers  Objective: Vitals:   10/24/21 1700 10/24/21 1800 10/24/21 1900 10/24/21 1920  BP: 118/65 (!) 125/56 132/65   Pulse: 76 67 73   Resp: (!) 21 (!) 0 (!) 24   Temp:    98 F (36.7 C)  TempSrc:    Axillary  SpO2: 94% 100% 99%   Weight:      Height:        Intake/Output Summary (Last 24 hours) at 10/24/2021 1959 Last data filed at 10/24/2021 1927 Gross per 24 hour  Intake 1596.76 ml  Output 3600 ml  Net -2003.24 ml   Filed Weights   10/22/21 0500 10/23/21 0400 10/24/21 0500  Weight: 67.3 kg 68.1 kg 70.6 kg   Physical Exam  Gen:-Intubated, more awake and following commands on Precedex  HEENT:- [place].AT, No sclera icterus HEENT-OG and ET  tube Lungs-fair, symmetrical air movement, no wheezing  CV- S1, S2 normal, RRR Abd-  +ve B.Sounds, Abd Soft, No tenderness,    Extremity/Skin:- No  edema,   good pedal pulses  Neuro-Psych-intubated and more awake and following commands on Precedex GU--- Foley with clear urine  Data Reviewed: I have personally reviewed following labs and imaging studies  CBC: Recent Labs  Lab 10/19/21 0548 10/20/21 0320 10/21/21 0406 10/22/21 0536 10/24/21 0419  WBC 6.1 7.5 6.1 4.8 6.6  HGB 10.1* 10.2* 8.5* 8.6* 8.8*  HCT 28.6* 31.2* 26.3* 26.3* 26.9*  MCV 100.7* 104.7* 104.8* 103.5* 103.9*  PLT 278 298 296 302 305    Basic Metabolic Panel: Recent Labs  Lab 10/19/21 0548 10/20/21 0320 10/21/21 0406 10/22/21 0536 10/23/21 0445 10/24/21 0419  NA 133* 138 140 139 138 136  K 3.9 3.3* 3.7 3.7 4.1 3.7  CL 100 107 111 109 107 107  CO2 25 27 23 25 25 24   GLUCOSE 112* 136* 127* 144* 122* 150*  BUN 15 16 16 15 22 19   CREATININE 0.52* 0.52* 0.48* 0.51* 0.49* 0.50*  CALCIUM 8.7* 8.8* 8.5* 8.5* 8.3* 8.2*  MG 1.9  --  1.9  --   --   --   PHOS  --   --  3.3  --   --   --     CBG: Recent Labs  Lab 10/24/21 0127 10/24/21 0750 10/24/21 1127 10/24/21 1524 10/24/21 1920  GLUCAP 125* 92 139* 124* 115*    Recent Results (from the past 240 hour(s))  Culture, Respiratory w Gram Stain     Status: None  Collection Time: 10/20/21  3:00 PM   Specimen: Tracheal Aspirate; Respiratory  Result Value Ref Range Status   Specimen Description   Final    TRACHEAL ASPIRATE Performed at [facility], [address], [place], [place] [address]    Special Requests   Final    NONE Performed at [facility], [address], [place], [place] [address]    Gram Stain   Final    NO SQUAMOUS EPITHELIAL CELLS SEEN FEW WBC SEEN FEW GRAM POSITIVE COCCI    Culture   Final    MODERATE Normal respiratory flora-no Staph aureus or Pseudomonas seen Performed at [facility], [address], [place],  [place] [address]    Report Status 10/23/2021 FINAL  Final     Radiology Studies: DG CHEST PORT 1 VIEW  Result Date: 10/24/2021 CLINICAL DATA:  72 year old male with pneumoconiosis, respiratory failure, intubated. EXAM: PORTABLE CHEST 1 VIEW COMPARISON:  Portable chest 10/23/2021 and earlier. FINDINGS: Portable AP semi upright view at 0512 hours. Mildly rotated to the right today. Endotracheal tube tip in good position between the clavicles and carina. Enteric tube courses to the abdomen, tip not included. Stable lung volumes and mediastinal contours. Extensive and frequently calcified bilateral nodular lung disease. Mild veiling opacity at both lung bases with small volume pleural fluid demonstrated by CT Abdomen and Pelvis last month. Stable ventilation. No pneumothorax. Stable visualized osseous structures. IMPRESSION: 1.  Stable lines and tubes. 2. Stable ventilation with probable small bilateral pleural effusions superimposed on chronic lung disease. Electronically Signed   By: Odessa Fleming M.D.   On: 10/24/2021 08:24   DG CHEST PORT 1 VIEW  Result Date: 10/23/2021 CLINICAL DATA:  Dyspnea. History of hypertension and chronic obstructive pulmonary disease. EXAM: PORTABLE CHEST 1 VIEW COMPARISON:  Multiple prior radiographs, most recently 10/22/2021 and 10/21/2021. Chest CT 08/30/2020. FINDINGS: 0801 hours. The endotracheal and enteric tubes are unchanged in position. The heart size and mediastinal contours are stable. Left lower lobe airspace disease appears slightly increased from yesterday, although improved from 10/20/2021. The lungs are otherwise clear aside from numerous calcified granulomas. No evidence of pneumothorax or significant pleural effusion. 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 IMPRESSION: Slightly increased retrocardiac opacity since yesterday. Otherwise stable chest. Electronically Signed   By: Carey Bullocks M.D.   On: 10/23/2021 10:37    Scheduled Meds:  Chlorhexidine  Gluconate Cloth  6 each Topical Daily   cholecalciferol  1,000 Units Per Tube Daily   docusate  200 mg Per Tube Daily   enoxaparin (LOVENOX) injection  40 mg Subcutaneous Q24H   feeding supplement (PROSource TF)  45 mL Per Tube TID   folic acid  1 mg Intravenous Daily   free water  200 mL Per Tube Q4H   ipratropium-albuterol  3 mL Nebulization Q6H   nicotine  21 mg Transdermal Daily   mouth rinse  15 mL Mouth Rinse Q2H   pantoprazole sodium  40 mg Per Tube Daily   Continuous Infusions:  sodium chloride Stopped (10/22/21 0802)   ampicillin-sulbactam (UNASYN) IV 3 g (10/24/21 1942)   dexmedetomidine (PRECEDEX) IV infusion 0.6 mcg/kg/hr (10/24/21 1743)   feeding supplement (OSMOLITE 1.2 CAL) 50 mL/hr at 10/24/21 1927    LOS: 11 days   Shon Hale, MD How to contact the [facility] Attending or Consulting provider 7A - 7P or covering provider during after hours 7P -7A, for this patient?  Check the care team in [facility] and look for a) attending/consulting TRH  provider listed and b) the [facility] team listed Log into [email or web address] and use [place]'s universal password to access. If you do not have the password, please contact the hospital operator. Locate the [facility] provider you are looking for under [facility] and page to a number that you can be directly reached. If you still have difficulty reaching the provider, please page the [facility] (Director on Call) for the Hospitalists listed on amion for assistance.  10/24/2021, 7:59 PM

## 2021-10-24 NOTE — Progress Notes (Signed)
Weaning stopped at 0744 due to increased RR. Placed back on full support. RN aware.

## 2021-10-24 NOTE — Progress Notes (Signed)
Palliative: [name], [name], is lying quietly in bed.  He appears acutely/chronically ill and frail.  He remains intubated/ventilated on Precedex.  He clearly cannot make his needs known.  There is no family at bedside.  Call to wife, Larry [name] at [phone].  Left somewhat detailed voice message as answering machine states "this is Larry [name]".  Ask for return phone call to verify relationship and or give contact information from Larry [name]'s sons in [place].  Conference with attending and bedside nursing staff related to patient condition, needs, goals of care.  Plan:   At this point continue full scope/full code by default.  Time for outcomes    .  PMT to follow.     35 minutes  [name], NP Palliative medicine team Team phone [phone] Greater than 50% of this time was spent counseling and coordinating care related to the above assessment and plan.

## 2021-10-24 NOTE — Progress Notes (Signed)
Nutrition Follow-up  DOCUMENTATION CODES:   Severe malnutrition in context of social or environmental circumstances, Underweight  INTERVENTION:  -Continue Osmolite 1.2 @ 20 ml/hr to advance 10 ml/hr q 6 hr to goal rate of 50 ml/hr (1200 ml) per tube +-ProSource TF 45 ml TID per tube   Provides: 1560 kcal, 99 gr protein and 984 ml water.    If no IVF- add free water 200 ml TID per tube  NUTRITION DIAGNOSIS:   Severe Malnutrition related to social / environmental circumstances as evidenced by severe fat depletion, severe muscle depletion.  - enteral feeding provided  GOAL:   Patient will meet greater than or equal to 90% of their needs  -ongoing   MONITOR:   PO intake, Supplement acceptance, Labs, Weight trends    ASSESSMENT:  Pt admitted from home with uncontrolled pain and weakness. 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  7/3 Patient remains intubated on ventilator support. Failed weaning attempt this morning. Discussed with nursing. Patient continues to tolerate tube feeding (regimen noted below). Flexiseal placed 7/2 and no skin breakdown reported. Weight gain noted.  On 6/29 patient started in Osmolite 1.2 @ 20 ml/hr to advance to goal rate of 50 ml/hr (1200 ml). Provides: 1440 kcal, 66 gr protein and 984 ml water. Patient will need additional protein to meet needs- Prosource TF 45 ml TID per tube.   MV: 16 L/min Temp (24hrs), Avg:98.2 F (36.8 C), Min:97.7 F (36.5 C), Max:99.4 F (37.4 C) MAP-67  Precedex: 8.13 ml/hr (214.6 kcal)   Intake/Output Summary (Last 24 hours) at 10/24/2021 1108 Last data filed at 10/24/2021 1028 Gross per 24 hour  Intake 1882.52 ml  Output 2700 ml  Net -817.48 ml   Since admission- +6.2 liters     Latest Ref Rng & Units 10/24/2021    4:19 AM 10/23/2021    4:45 AM 10/22/2021     5:36 AM  BMP  Glucose 70 - 99 mg/dL 191  478  295   BUN 8 - 23 mg/dL 19  22  15    Creatinine 0.61 - 1.24 mg/dL  6.21  3.08   Sodium 135 - 145 mmol/L 136  138  139   Potassium 3.5 - 5.1 mmol/L 3.7  4.1  3.7   Chloride 98 - 111 mmol/L 107  107  109   CO2 22 - 32 mmol/L 24  25  25    Calcium 8.9 - 10.3 mg/dL 8.2  8.3  8.5      Diet Order:   Diet Order             Diet NPO time specified Except for: Sips with Meds  Diet effective now                   EDUCATION NEEDS:   No education needs have been identified at this time  Skin:  Skin Assessment: Reviewed RN Assessment  Last BM:  7/2 flexiseal in place   Height:   Ht Readings from Last 1 Encounters:  10/23/21 6\' 2"  (1.88 m)    Weight:   Wt Readings from Last 1 Encounters:  10/24/21 70.6 kg  Admit weight- 61.2 kg  Ideal Body Weight:   86 kg  BMI:  Body mass index is 19.98 kg/m.  Estimated Nutritional Needs:   Kcal:  1674  Protein:  90-100 gr  Fluid:  >1600  ml daily   Royann Shivers MS,RD,CSG,LDN Contact: Loretha Stapler

## 2021-10-25 DIAGNOSIS — Z7189 Other specified counseling: Secondary | ICD-10-CM | POA: Diagnosis not present

## 2021-10-25 DIAGNOSIS — Z515 Encounter for palliative care: Secondary | ICD-10-CM | POA: Diagnosis not present

## 2021-10-25 DIAGNOSIS — J9602 Acute respiratory failure with hypercapnia: Secondary | ICD-10-CM | POA: Diagnosis not present

## 2021-10-25 DIAGNOSIS — T17908D Unspecified foreign body in respiratory tract, part unspecified causing other injury, subsequent encounter: Secondary | ICD-10-CM | POA: Diagnosis not present

## 2021-10-25 DIAGNOSIS — R52 Pain, unspecified: Secondary | ICD-10-CM | POA: Diagnosis not present

## 2021-10-25 DIAGNOSIS — F1010 Alcohol abuse, uncomplicated: Secondary | ICD-10-CM | POA: Diagnosis not present

## 2021-10-25 DIAGNOSIS — J9601 Acute respiratory failure with hypoxia: Secondary | ICD-10-CM | POA: Diagnosis not present

## 2021-10-25 LAB — GLUCOSE, CAPILLARY
Glucose-Capillary: 111 mg/dL — ABNORMAL HIGH (ref 70–99)
Glucose-Capillary: 120 mg/dL — ABNORMAL HIGH (ref 70–99)
Glucose-Capillary: 142 mg/dL — ABNORMAL HIGH (ref 70–99)
Glucose-Capillary: 125 mg/dL — ABNORMAL HIGH (ref 70–99)
Glucose-Capillary: 111 mg/dL — ABNORMAL HIGH (ref 70–99)

## 2021-10-25 MED ORDER — CLONAZEPAM 0.5 MG PO TABS
1.0000 mg | ORAL_TABLET | Freq: Two times a day (BID) | ORAL | Status: DC
Start: 2021-10-25 — End: 2021-10-25

## 2021-10-25 MED ORDER — CLONAZEPAM 0.5 MG PO TABS
1.0000 mg | ORAL_TABLET | Freq: Two times a day (BID) | ORAL | Status: DC
  Administered 2021-10-25 – 2021-10-27 (×5): 1 mg
  Filled 2021-10-25 (×5): qty 2

## 2021-10-25 NOTE — Plan of Care (Signed)

## 2021-10-25 NOTE — Progress Notes (Addendum)
NAME:  Larry [name], MRN:  [id number], DOB:  August 08, 1950, LOS: 12 ADMISSION DATE:  10/13/2021, CONSULTATION DATE:  10/25/2021  REFERRING MD:  Marisa Severin, TRH , CHIEF COMPLAINT: Respiratory failure  History of Present Illness:  71 year old EtOH user, smoker BIBEMS on 6/22 for generalized weakness and multiple falls over the past several months.  He had pain in his mid to lower back .  His home was in a terrible condition, there were bedbugs.  He was severely malnourished initial imaging showed displaced rib fracture posterior left 10th, 11th and 12th ribs and left T2 transverse process fracture. Initial labs were significant for hyponatremia. Social work and APS referrals were made On 6/28 he was noted to be confused, disoriented with tremors consistent with DTs.  CIWA protocol was started and he was transferred to stepdown.  He was noted to be febrile, coughing and dyspnea consistent with aspiration.  He required intubation and mechanical ventilation, hypotensive requiring fluid bolus and Levophed drip  Pertinent  Medical History  EtOH abuse Hypertension Pneumoconiosis -coalminer's disease  Significant Hospital Events: Including procedures, antibiotic start and stop dates in addition to other pertinent events   6/29 ETT >> 6/29 ET culture : few wbc's, nl flora    Scheduled Meds:  Chlorhexidine Gluconate Cloth  6 each Topical Daily   cholecalciferol  1,000 Units Per Tube Daily   docusate  200 mg Per Tube Daily   enoxaparin (LOVENOX) injection  40 mg Subcutaneous Q24H   feeding supplement (PROSource TF)  45 mL Per Tube TID   free water  200 mL Per Tube Q4H   ipratropium-albuterol  3 mL Nebulization Q6H   nicotine  21 mg Transdermal Daily   mouth rinse  15 mL Mouth Rinse Q2H   pantoprazole sodium  40 mg Per Tube Daily   Continuous Infusions:  sodium chloride Stopped (10/22/21 0802)   ampicillin-sulbactam (UNASYN) IV 3 g (10/25/21 0746)   dexmedetomidine (PRECEDEX) IV infusion 0.8  mcg/kg/hr (10/25/21 1206)   feeding supplement (OSMOLITE 1.2 CAL) 50 mL/hr at 10/25/21 1152   PRN Meds:.acetaminophen, albuterol, fentaNYL (SUBLIMAZE) injection, ibuprofen, ondansetron **OR** ondansetron (ZOFRAN) IV, mouth rinse, oxyCODONE     Interim History / Subjective:  Chronically ill breathing above back up rate on vent and following one step commnds   Objective   Blood pressure 123/61, pulse 78, temperature 97.9 F (36.6 C), temperature source Axillary, resp. rate 16, height 6\' 2"  (1.88 m), weight 69.1 kg, SpO2 98 %.    Vent Mode: PRVC FiO2 (%):  [40 %] 40 % Set Rate:  [18 bmp] 18 bmp Vt Set:  [640 mL] 640 mL PEEP:  [5 cmH20] 5 cmH20 Pressure Support:  [10 cmH20] 10 cmH20 Plateau Pressure:  [11 cmH20-23 cmH20] 17 cmH20   Intake/Output Summary (Last 24 hours) at 10/25/2021 1249 Last data filed at 10/25/2021 1152 Gross per 24 hour  Intake 5742.62 ml  Output 1350 ml  Net 4392.62 ml   Filed Weights   10/23/21 0400 10/24/21 0500 10/25/21 0621  Weight: 68.1 kg 70.6 kg 69.1 kg    Examination:  Tmax:  99.2 trending down General appearance:   chronically ill frail elderly wm on precedex drip/ full vent support    No jvd Oropharynx oral ET  Neck supple Lungs with a few scattered exp > insp rhonchi bilaterally RRR no s3 or or sign murmur Abd mildly distended, soft with limited excursion  Extr warm with no edema or clubbing noted Neuro  Sensorium follows one step commands ,  no apparent motor deficits   Cxr pending    Resolved Hospital Problem list   Hyponatremia  Assessment & Plan:  Acute hypoxic respiratory failure Aspiration pneumonia - Air trapping on vent 7/3 and failed PSV wean/ still trapping today and suspect he has more copd than apparent by hx which results in air trapping > panic > increased RR > more air trapping in a cyclical patter so may need "extubate to wean" approach AM 7/5 and immediate transition to bipap at time of extubation  >>>Continue duoneb    >>>Continue Unasyn snce no pathologic org on ET culture from 6/29  >>> continue precedex and added clonazepam pm 10/25/2021     Left-sided rib fractures, vertebral fracture  acute pain -Consider nonnarcotics eg clonidine for hbp/sedation  and nsaids  Acute metabolic encephalopathy Delirium tremens >>>for now maint on Precedex and intermittent fentanyl for goal RASS 0 to -1 but wean to clonidine likely to be needed  >>> Versed as needed for breakthrough agitation   Severe protein cal manutrition present on admit - note albumin very low  >>> tf per [facility]/nutrition  Will check cxr w/a and bnp/ tsh in am to be complete re why he can't wean better.  Best Practice (right click and "Reselect all SmartList Selections" daily)    Per [facility]   Labs   CBC: Recent Labs  Lab 10/19/21 0548 10/20/21 0320 10/21/21 0406 10/22/21 0536 10/24/21 0419  WBC 6.1 7.5 6.1 4.8 6.6  HGB 10.1* 10.2* 8.5* 8.6* 8.8*  HCT 28.6* 31.2* 26.3* 26.3* 26.9*  MCV 100.7* 104.7* 104.8* 103.5* 103.9*  PLT 278 298 296 302 305    Basic Metabolic Panel: Recent Labs  Lab 10/19/21 0548 10/20/21 0320 10/21/21 0406 10/22/21 0536 10/23/21 0445 10/24/21 0419  NA 133* 138 140 139 138 136  K 3.9 3.3* 3.7 3.7 4.1 3.7  CL 100 107 111 109 107 107  CO2 25 27 23 25 25 24   GLUCOSE 112* 136* 127* 144* 122* 150*  BUN 15 16 16 15 22 19   CREATININE 0.52* 0.52* 0.48* 0.51* 0.49* 0.50*  CALCIUM 8.7* 8.8* 8.5* 8.5* 8.3* 8.2*  MG 1.9  --  1.9  --   --   --   PHOS  --   --  3.3  --   --   --    GFR: Estimated Creatinine Clearance: 84 mL/min (A) (by C-G formula based on SCr of 0.5 mg/dL (L)). Recent Labs  Lab 10/20/21 0320 10/21/21 0406 10/22/21 0536 10/24/21 0419  WBC 7.5 6.1 4.8 6.6    Liver Function Tests: Recent Labs  Lab 10/20/21 0320 10/21/21 0406 10/22/21 0536 10/23/21 0445  AST 21  --  82* 49*  ALT 23  --  73* 60*  ALKPHOS 87  --  172* 182*  BILITOT 0.5  --  0.5 0.6  PROT 5.8*  --  5.4* 5.1*   ALBUMIN 2.4* 2.1* 2.0* 2.0*   No results for input(s): "LIPASE", "AMYLASE" in the last 168 hours. No results for input(s): "AMMONIA" in the last 168 hours.  ABG    Component Value Date/Time   PHART 7.44 10/20/2021 0520   PCO2ART 44 10/20/2021 0520   PO2ART 90 10/20/2021 0520   HCO3 29.9 (H) 10/20/2021 0520   TCO2 23 02/28/2018 1643   O2SAT 100 10/20/2021 0520     Coagulation Profile: Recent Labs  Lab 10/23/21 0445  INR 1.1    Cardiac Enzymes: No results for input(s): "CKTOTAL", "CKMB", "CKMBINDEX", "TROPONINI" in the  last 168 hours.  HbA1C: No results found for: "HGBA1C"  CBG: Recent Labs  Lab 10/24/21 1920 10/24/21 2351 10/25/21 0510 10/25/21 0753 10/25/21 1135  GLUCAP 115* 115* 111* 120* 142*      The patient is critically ill with multiple organ systems failure and requires high complexity decision making for assessment and support, frequent evaluation and titration of therapies, application of advanced monitoring technologies and extensive interpretation of multiple databases. Critical Care Time devoted to patient care services described in this note is 38 minutes.   Sandrea Hughs, MD Pulmonary and Critical Care Medicine [facility] Cell [phone]   After 7:00 pm call Elink  [phone]

## 2021-10-25 NOTE — Progress Notes (Signed)
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  He admits to drinking one half of 1/5 of whiskey daily.  He is mostly complaining of pain to his mid and lower back.  His home was noted to be in terrible condition and he was covered and bedbugs and all of his clothing had to be removed and he had to be bathed in the ED.  He was sent for work-up in the ED including CT scans and x-rays and noted to have displaced rib fractures in the posterior left 11th and 12th ribs and mildly displaced left 10th rib fracture and severe hepatic steatosis and left L2 transverse process acute fracture.  He is notably in severe pain and noted to be hyponatremic as well.  Admission was requested for pain management and possible placement.  He has severe problems with frequent falling likely related to alcohol intoxication.   Assessment and Plan: 1) acute hypoxic respiratory failure--- in the setting of aspiration pneumonia and full-blown delirium tremens -Intubated 10/19/2021 -Pulmonary consult appreciated -Currently on Precedex for sedation and as needed fentanyl -10/25/21---  awake and following commands on Precedex -Not doing well with CPAP trial--becomes very tachypneic  -Is back on the vent Repeat CXR on 10/26/21  2)multiple rib fractures/Lumbar transverse process fracture/Falls - TLSO brace to assist with severe rib fracture pain when he comes off vent -  3)Aspiration pneumonia with acute  hypoxic respiratory failure -In the setting of alcohol abuse/delirium tremens  -Continue IV Unasyn (started on 10/19/2021) -c/n  bronchodilators, -Currently intubated/.vented  4)Delirium tremens----currently intubated and sedated - transitioned Precedex from propofol on 10/21/2021  5)Hypomagnesemia/hyponatremia -- Hydrate and replace lites  6)Macrocytic anemia - serum B12  Is 973, low folic acid level being repleted  7)Bedbug infestation - Reportedly noted to be living in substandard horrible conditions according to [facility] EMS report - He has been thoroughly cleansed and clothing changed out in the emergency department - [facility] consultation with social work for APS referral  8)Severe hepatic steatosis/alcoholic hepatitis - Secondary to chronic alcohol abuse - He should follow-up with GI on outpatient basis for ongoing surveillance  9)Failure to thrive in adult/severe protein caloric malnutrition - Patient is severely deconditioned and malnourished and living in substandard conditions and we have asked for a physical therapy consultation and TOC social work consultation for possible APS referral and placement in a safe for home environment. -Dietitian consult appreciated,   10)Pneumoconiosis, coal, workers' [facility] [employer]) - smoker- use bronchodilators as needed  11)HTN--- had hypotension postintubation, required Levophed for pressure support -Off Levophed  -BP stable  12)FEN--tolerating Osmolite tube feed with water flushes Having BMs  13)Social/Ethics- Unable to reach family/next of kin for decision-making purposes--DSS/APS notified -TOC and palliative care helping to reach family  Falls frequently - This is most likely secondary to chronic alcohol intoxication in addition to substandard living conditions - PT evaluation recommending SNF rehab  - Fall precautions recommended --working on SNF rehab placement   CRITICAL CARE Performed by: Shon Hale   Total critical care  time: 44  minutes  Critical care time was exclusive of separately billable procedures and treating other patients.  Critical care was necessary to treat or prevent imminent or life-threatening deterioration. -Remains intubated --Precedex/and prn fentanyl -Vent--PRVC /40%/5/18/640 TV  Critical care was time spent personally by me on the following activities: development of treatment plan with patient and/or surrogate as well as nursing, discussions with consultants, evaluation of patient's response to treatment, examination of patient, obtaining history from patient or surrogate, ordering and performing treatments and interventions, ordering and review of laboratory studies, ordering and review of radiographic studies, pulse oximetry and re-evaluation of patient's condition.   DVT prophylaxis: enoxaparin Code Status: full code Family Communication: none present  Disposition: Currently intubated - Not medically ready for discharge Status is: Inpatient Remains inpatient appropriate because: IV fluids, electrolyte replacement , intubated and sedated   Consultants: palliative care, PT /pulmonary critical care  Procedures:  10/19/2021 intubation Antimicrobials:  Unasyn started on 10/19/2021  Subjective: Intubated - awake and following commands on Precedex No fevers Still Unable to reach family/next of kin for decision-making purposes--DSS/APS notified -Difficulty extubating patient due to significant tachypnea with CPAP trials -Had BMs  Objective: Vitals:   10/25/21 0900 10/25/21 1000 10/25/21 1129 10/25/21 1137  BP: (!) 102/44 123/61    Pulse: 76 76    Resp: 18 18    Temp:    97.9 F (36.6 C)  TempSrc:    Axillary  SpO2: 97% 100% 100%   Weight:      Height:        Intake/Output Summary (Last 24 hours) at 10/25/2021 1209 Last data filed at 10/25/2021 1152 Gross per 24 hour  Intake 6176.45 ml  Output 1350 ml  Net 4826.45 ml   Filed Weights   10/23/21 0400 10/24/21 0500 10/25/21 0621   Weight: 68.1 kg 70.6 kg 69.1 kg   Physical Exam  Gen:-Intubated, awake and following commands on Precedex  HEENT:- [place].AT, No sclera icterus HEENT-OG and ET tube Lungs-fair, symmetrical air movement, no wheezing  CV- S1, S2 normal, RRR Abd-  +ve B.Sounds, Abd Soft, No tenderness,    Extremity/Skin:- No  edema,   good pedal pulses  Neuro-Psych-intubated,  awake and following commands on Precedex GU--- Foley with clear urine Rectal Tube with brown stool  Data Reviewed: I have personally reviewed following labs and imaging studies  CBC: Recent Labs  Lab 10/19/21 0548 10/20/21 0320 10/21/21 0406 10/22/21 0536 10/24/21 0419  WBC 6.1 7.5 6.1 4.8 6.6  HGB 10.1* 10.2* 8.5* 8.6* 8.8*  HCT 28.6* 31.2* 26.3* 26.3* 26.9*  MCV 100.7* 104.7* 104.8* 103.5* 103.9*  PLT 278 298 296 302 305    Basic Metabolic Panel: Recent Labs  Lab 10/19/21 0548 10/20/21 0320 10/21/21 0406 10/22/21 0536 10/23/21 0445 10/24/21 0419  NA 133* 138 140 139 138 136  K 3.9 3.3* 3.7 3.7 4.1 3.7  CL 100 107 111 109 107 107  CO2 GLUCOSE 112* 136* 127* 144* 122* 150*  BUN CREATININE 0.52* 0.52* 0.48* 0.51* 0.49* 0.50*  CALCIUM 8.7* 8.8* 8.5* 8.5* 8.3* 8.2*  MG 1.9  --  1.9  --   --   --   PHOS  --   --  3.3  --   --   --     CBG: Recent Labs  Lab 10/24/21 1920 10/24/21 2351 10/25/21 0510 10/25/21 0753 10/25/21 1135  GLUCAP 115* 115* 111* 120* 142*  Recent Results (from the past 240 hour(s))  Culture, Respiratory w Gram Stain     Status: None   Collection Time: 10/20/21  3:00 PM   Specimen: Tracheal Aspirate; Respiratory  Result Value Ref Range Status   Specimen Description   Final    TRACHEAL ASPIRATE Performed at [facility], [address] [place], [place] [address]    Special Requests   Final    NONE Performed at [facility], [address] [place], [place] [address]    Gram Stain   Final    NO SQUAMOUS EPITHELIAL CELLS SEEN FEW WBC  SEEN FEW GRAM POSITIVE COCCI    Culture   Final    MODERATE Normal respiratory flora-no Staph aureus or Pseudomonas seen Performed at [facility], [address], [place], [place] [address]    Report Status 10/23/2021 FINAL  Final     Radiology Studies: DG CHEST PORT 1 VIEW  Result Date: 10/24/2021 CLINICAL DATA:  71 year old male with pneumoconiosis, respiratory failure, intubated. EXAM: PORTABLE CHEST 1 VIEW COMPARISON:  Portable chest 10/23/2021 and earlier. FINDINGS: Portable AP semi upright view at 0512 hours. Mildly rotated to the right today. Endotracheal tube tip in good position between the clavicles and carina. Enteric tube courses to the abdomen, tip not included. Stable lung volumes and mediastinal contours. Extensive and frequently calcified bilateral nodular lung disease. Mild veiling opacity at both lung bases with small volume pleural fluid demonstrated by CT Abdomen and Pelvis last month. Stable ventilation. No pneumothorax. Stable visualized osseous structures. IMPRESSION: 1.  Stable lines and tubes. 2. Stable ventilation with probable small bilateral pleural effusions superimposed on chronic lung disease. Electronically Signed   By: Odessa Fleming M.D.   On: 10/24/2021 08:24    Scheduled Meds:  Chlorhexidine Gluconate Cloth  6 each Topical Daily   cholecalciferol  1,000 Units Per Tube Daily   docusate  200 mg Per Tube Daily   enoxaparin (LOVENOX) injection  40 mg Subcutaneous Q24H   feeding supplement (PROSource TF)  45 mL Per Tube TID   free water  200 mL Per Tube Q4H   ipratropium-albuterol  3 mL Nebulization Q6H   nicotine  21 mg Transdermal Daily   mouth rinse  15 mL Mouth Rinse Q2H   pantoprazole sodium  40 mg Per Tube Daily   Continuous Infusions:  sodium chloride Stopped (10/22/21 0802)   ampicillin-sulbactam (UNASYN) IV 3 g (10/25/21 0746)   dexmedetomidine (PRECEDEX) IV infusion 0.8 mcg/kg/hr (10/25/21 1206)   feeding supplement (OSMOLITE 1.2 CAL) 50 mL/hr at  10/25/21 1152    LOS: 12 days   Shon Hale, MD How to contact the [facility] Attending or Consulting provider 7A - 7P or covering provider during after hours 7P -7A, for this patient?  Check the care team in [facility] and look for a) attending/consulting TRH provider listed and b) the [facility] team listed Log into [email or web address] and use [place]'s universal password to access. If you do not have the password, please contact the hospital operator. Locate the [facility] provider you are looking for under [facility] and page to a number that you can be directly reached. If you still have difficulty reaching the provider, please page the [facility] (Director on Call) for the Hospitalists listed on amion for assistance.  10/25/2021, 12:09 PM

## 2021-10-25 NOTE — Progress Notes (Addendum)
Pharmacy Antibiotic Note  Larry [name] a 71 y.o. male admitted on 10/25/2021 with aspiration pneumonia.  Pharmacy has been consulted for unasyn dosing.  Plan: Continue Unasyn 3gm iv q6h F/U duration  Medical History: 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   Hypertension    Shingles    Shortness of breath dyspnea    SOB with exertion   Thyroid disease    hypothyroidism    Allergies:  No Known Allergies  Filed Weights   10/23/21 0400 10/24/21 0500 10/25/21 0621  Weight: 68.1 kg (150 lb 2.1 oz) 70.6 kg (155 lb 10.3 oz) 69.1 kg (152 lb 5.4 oz)       Latest Ref Rng & Units 10/24/2021    4:19 AM 10/22/2021    5:36 AM 10/21/2021    4:06 AM  CBC  WBC 4.0 - 10.5 K/uL 6.6  4.8  6.1   Hemoglobin 13.0 - 17.0 g/dL 8.8  8.6  8.5   Hematocrit 39.0 - 52.0 % 26.9  26.3  26.3   Platelets 150 - 400 K/uL 305  302  296      Estimated Creatinine Clearance: 84 mL/min (A) (by C-G formula based on SCr of 0.5 mg/dL (L)).  Antibiotics Given (last 72 hours)     Date/Time Action Medication Dose Rate   10/22/21 1440 New Bag/Given   Ampicillin-Sulbactam (UNASYN) 3 g in sodium chloride 0.9 % 100 mL IVPB 3 g 200 mL/hr   10/22/21 2005 New Bag/Given   Ampicillin-Sulbactam (UNASYN) 3 g in sodium chloride 0.9 % 100 mL IVPB 3 g 200 mL/hr   10/23/21 0137 New Bag/Given   Ampicillin-Sulbactam (UNASYN) 3 g in sodium chloride 0.9 % 100 mL IVPB 3 g 200 mL/hr   10/23/21 0741 New Bag/Given   Ampicillin-Sulbactam (UNASYN) 3 g in sodium chloride 0.9 % 100 mL IVPB 3 g 200 mL/hr   10/23/21 1240 New Bag/Given   Ampicillin-Sulbactam (UNASYN) 3 g in sodium chloride 0.9 % 100 mL IVPB 3 g 200 mL/hr   10/23/21 2054 New Bag/Given   Ampicillin-Sulbactam (UNASYN) 3 g in sodium chloride 0.9 % 100 mL IVPB 3 g 200 mL/hr   10/24/21 0224 New Bag/Given   Ampicillin-Sulbactam (UNASYN) 3 g in sodium chloride 0.9 % 100 mL IVPB 3 g  200 mL/hr   10/24/21 0800 New Bag/Given   Ampicillin-Sulbactam (UNASYN) 3 g in sodium chloride 0.9 % 100 mL IVPB 3 g 200 mL/hr   10/24/21 1446 New Bag/Given   Ampicillin-Sulbactam (UNASYN) 3 g in sodium chloride 0.9 % 100 mL IVPB 3 g 200 mL/hr   10/24/21 1942 New Bag/Given   Ampicillin-Sulbactam (UNASYN) 3 g in sodium chloride 0.9 % 100 mL IVPB 3 g 200 mL/hr   10/25/21 0226 New Bag/Given   Ampicillin-Sulbactam (UNASYN) 3 g in sodium chloride 0.9 % 100 mL IVPB 3 g 200 mL/hr   10/25/21 0746 New Bag/Given   Ampicillin-Sulbactam (UNASYN) 3 g in sodium chloride 0.9 % 100 mL IVPB 3 g 200 mL/hr       Antimicrobials this admission:  unasyn 10/19/2021  >>   Microbiology results: 10/19/2021 Resp Cx: few gram + cocci  Thank you for allowing pharmacy to be a part of this patient's care.  Tad Moore, PharmD Clinical Pharmacist

## 2021-10-25 NOTE — Progress Notes (Signed)
Palliative: [name], [name], is lying quietly in bed.  He is intubated/ventilated and sedated.  There is no family present at this time.  Call to estranged wife, Larry [name], at [phone].  Left voicemail message requesting return call and contact information for sons in [place].  Conference with attending, bedside nursing staff, transition of care team related to patient condition, needs, goals of care, disposition.  Plan:  At this point, continue full scope/code.   Time for outcomes.  PMT to follow.  PMT to work with [facility] team for guardianship goals.  35 minutes [name], NP Palliative medicine team Team phone [phone] Greater than 50% of this time was spent counseling and coordinating care related to the above assessment and plan.

## 2021-10-26 ENCOUNTER — Inpatient Hospital Stay (HOSPITAL_COMMUNITY): Payer: Medicare HMO

## 2021-10-26 DIAGNOSIS — T17908D Unspecified foreign body in respiratory tract, part unspecified causing other injury, subsequent encounter: Secondary | ICD-10-CM | POA: Diagnosis not present

## 2021-10-26 DIAGNOSIS — R52 Pain, unspecified: Secondary | ICD-10-CM | POA: Diagnosis not present

## 2021-10-26 DIAGNOSIS — F1010 Alcohol abuse, uncomplicated: Secondary | ICD-10-CM | POA: Diagnosis not present

## 2021-10-26 DIAGNOSIS — S2243XA Multiple fractures of ribs, bilateral, initial encounter for closed fracture: Secondary | ICD-10-CM | POA: Diagnosis not present

## 2021-10-26 DIAGNOSIS — Z515 Encounter for palliative care: Secondary | ICD-10-CM | POA: Diagnosis not present

## 2021-10-26 DIAGNOSIS — R7401 Elevation of levels of liver transaminase levels: Secondary | ICD-10-CM | POA: Diagnosis not present

## 2021-10-26 DIAGNOSIS — Z7189 Other specified counseling: Secondary | ICD-10-CM | POA: Diagnosis not present

## 2021-10-26 DIAGNOSIS — J9601 Acute respiratory failure with hypoxia: Secondary | ICD-10-CM | POA: Diagnosis not present

## 2021-10-26 LAB — BASIC METABOLIC PANEL
Anion gap: 7 (ref 5–15)
BUN: 16 mg/dL (ref 8–23)
CO2: 27 mmol/L (ref 22–32)
Calcium: 8 mg/dL — ABNORMAL LOW (ref 8.9–10.3)
Chloride: 103 mmol/L (ref 98–111)
Creatinine, Ser: 0.46 mg/dL — ABNORMAL LOW (ref 0.61–1.24)
GFR, Estimated: >60 mL/min (ref >60)
Glucose, Bld: 120 mg/dL — ABNORMAL HIGH (ref 70–99)
Potassium: 3.8 mmol/L (ref 3.5–5.1)
Sodium: 137 mmol/L (ref 135–145)

## 2021-10-26 LAB — CBC
HCT: 27.5 % — ABNORMAL LOW (ref 39.0–52.0)
Hemoglobin: 8.6 g/dL — ABNORMAL LOW (ref 13.0–17.0)
MCH: 32.8 pg (ref 26.0–34.0)
MCHC: 31.3 g/dL (ref 30.0–36.0)
MCV: 105 fL — ABNORMAL HIGH (ref 80.0–100.0)
Platelets: 239 10*3/uL (ref 150–400)
RBC: 2.62 MIL/uL — ABNORMAL LOW (ref 4.22–5.81)
RDW: 14.5 % (ref 11.5–15.5)
WBC: 4.9 10*3/uL (ref 4.0–10.5)
nRBC: 0 % (ref 0.0–0.2)

## 2021-10-26 LAB — GLUCOSE, CAPILLARY
Glucose-Capillary: 107 mg/dL — ABNORMAL HIGH (ref 70–99)
Glucose-Capillary: 109 mg/dL — ABNORMAL HIGH (ref 70–99)
Glucose-Capillary: 111 mg/dL — ABNORMAL HIGH (ref 70–99)
Glucose-Capillary: 112 mg/dL — ABNORMAL HIGH (ref 70–99)
Glucose-Capillary: 117 mg/dL — ABNORMAL HIGH (ref 70–99)
Glucose-Capillary: 120 mg/dL — ABNORMAL HIGH (ref 70–99)

## 2021-10-26 LAB — BRAIN NATRIURETIC PEPTIDE: B Natriuretic Peptide: 125 pg/mL — ABNORMAL HIGH (ref 0.0–100.0)

## 2021-10-26 LAB — TSH: TSH: 3.716 u[IU]/mL (ref 0.350–4.500)

## 2021-10-26 NOTE — Progress Notes (Signed)
Palliative: Larry [name] is lying quietly in bed.  He is intubated/ventilated.  He will make an somewhat keep eye contact.  He nods yes and no to questions, but I am not sure that his answers are accurate.  I reassure him that we are caring for him.  I do not believe that he can make his basic needs known.  There is no family present at bedside at this time.  Larry [name] has a personal notebook with numbers.  I point to different names and ask if they are friends and family.  Unfortunately, there is no contact information for his sons who live in [place].  There are no family member phone numbers listed either.  Call to estranged wife, Larry [name].  Left voicemail message requesting return phone call with contact information for sons.   Detail conference with transition of care team related to patient needs.  Conference with attending, pulmonologist, bedside nursing staff, transition of care team related to patient condition, needs, goals of care, disposition.  Plan: At this point full scope/full code.  Time for outcomes.  Once extubated goals of care discussions should be held with Larry [name] to determine if he would want reintubation.  35 minutes [name], NP Palliative medicine team Team phone [phone] Greater than 50% of this time was spent counseling and coordinating care related to the above assessment and plan.

## 2021-10-26 NOTE — Progress Notes (Signed)
NAME:  Larry [name], MRN:  [id number], DOB:  Sep 08, 1950, LOS: 13 ADMISSION DATE:  10/13/2021, CONSULTATION DATE:  10/26/2021  REFERRING MD:  Marisa Severin TRH , CHIEF COMPLAINT: Respiratory failure  History of Present Illness:  71 year old EtOH user, smoker BIBEMS on 6/22 for generalized weakness and multiple falls over the past several months.  He had pain in his mid to lower back .  His home was in a terrible condition, there were bedbugs.  He was severely malnourished initial imaging showed displaced rib fracture posterior left 10th, 11th and 12th ribs and left T2 transverse process fracture. Initial labs were significant for hyponatremia. Social work and APS referrals were made On 6/28 he was noted to be confused, disoriented with tremors consistent with DTs.  CIWA protocol was started and he was transferred to stepdown.  He was noted to be febrile, coughing and dyspnea consistent with aspiration.  He required intubation and mechanical ventilation, hypotensive requiring fluid bolus and Levophed drip  Pertinent  Medical History  EtOH abuse Hypertension Pneumoconiosis -coalminer's disease  Significant Hospital Events: Including procedures, antibiotic start and stop dates in addition to other pertinent events   6/29 ETT >> 6/29 ET culture : few wbc's, nl flora    Scheduled Meds:  Chlorhexidine Gluconate Cloth  6 each Topical Daily   cholecalciferol  1,000 Units Per Tube Daily   clonazePAM  1 mg Per Tube BID   docusate  200 mg Per Tube Daily   enoxaparin (LOVENOX) injection  40 mg Subcutaneous Q24H   feeding supplement (PROSource TF)  45 mL Per Tube TID   free water  200 mL Per Tube Q4H   ipratropium-albuterol  3 mL Nebulization Q6H   nicotine  21 mg Transdermal Daily   mouth rinse  15 mL Mouth Rinse Q2H   pantoprazole sodium  40 mg Per Tube Daily   Continuous Infusions:  sodium chloride Stopped (10/22/21 0802)   ampicillin-sulbactam (UNASYN) IV 200 mL/hr at 10/26/21 0821    dexmedetomidine (PRECEDEX) IV infusion 0.4 mcg/kg/hr (10/26/21 0821)   feeding supplement (OSMOLITE 1.2 CAL) 50 mL/hr at 10/26/21 0821   PRN Meds:.acetaminophen, albuterol, fentaNYL (SUBLIMAZE) injection, ibuprofen, ondansetron **OR** ondansetron (ZOFRAN) IV, mouth rinse, oxyCODONE     Interim History / Subjective:  Appears alert enough for extubation and motions he wants ET out   Objective   Blood pressure 121/64, pulse 84, temperature 99.1 F (37.3 C), temperature source Axillary, resp. rate (!) 24, height 6\' 2"  (1.88 m), weight 69.5 kg, SpO2 97 %.    Vent Mode: PRVC FiO2 (%):  [40 %] 40 % Set Rate:  [18 bmp] 18 bmp Vt Set:  [640 mL] 640 mL PEEP:  [5 cmH20] 5 cmH20 Plateau Pressure:  [15 cmH20-23 cmH20] 22 cmH20   Intake/Output Summary (Last 24 hours) at 10/26/2021 1232 Last data filed at 10/26/2021 12/27/2021 Gross per 24 hour  Intake 3187.27 ml  Output 2550 ml  Net 637.27 ml   Filed Weights   10/24/21 0500 10/25/21 0621 10/26/21 0458  Weight: 70.6 kg 69.1 kg 69.5 kg    Examination: Tmax:  99.1 General appearance:    chronically ill appearin / nad on PS 12   No jvd Oropharynx  et in place  Neck supple Lungs with distant bsbilaterally RRR no s3 or or sign murmur Abd soft/ lilmited excursion  Extr warm with no edema or clubbing noted Neuro  Sensorium f/c wants et out ,  no apparent motor deficits    I personally reviewed  images and agree with radiology impression as follows:  CXR:   portable 7/5   No change LLL decreased aeration    Resolved Hospital Problem list   Hyponatremia  Assessment & Plan:  Acute hypoxic respiratory failure Aspiration pneumonia - Air trapping on vent 7/4and failed PSV wean/ still trapping today and suspect he has more copd than apparent by hx which results in air trapping > panic > increased RR > more air trapping in a cyclical patter so plan on "extubate to wean" approach  7/5 and immediate transition to bipap at time of extubation if needed   >>>Continue duoneb   >>>Continue Unasyn snce no pathologic org on ET culture from 6/29  >>> continue precedex and added clonazepam pm 10/26/2021     Left-sided rib fractures, vertebral fracture  acute pain -Consider nonnarcotics eg clonidine for hbp/sedation  and nsaids  Acute metabolic encephalopathy Delirium tremens >>>for now maint on Precedex and intermittent fentanyl for goal RASS 0 to -1 but wean to clonidine likely to be needed  >>> added clonazepam 1 mg bid 7/4 to help with weaning (see above) if tolerates and allows wean off precedex    Severe protein cal manutrition present on admit - note albumin very low  >>> tf per [facility]/nutrition    Best Practice (right click and "Reselect all SmartList Selections" daily)    Per [facility]   Labs   CBC: Recent Labs  Lab 10/20/21 0320 10/21/21 0406 10/22/21 0536 10/24/21 0419 10/26/21 0411  WBC 7.5 6.1 4.8 6.6 4.9  HGB 10.2* 8.5* 8.6* 8.8* 8.6*  HCT 31.2* 26.3* 26.3* 26.9* 27.5*  MCV 104.7* 104.8* 103.5* 103.9* 105.0*  PLT 298 296 302 305 239    Basic Metabolic Panel: Recent Labs  Lab 10/21/21 0406 10/22/21 0536 10/23/21 0445 10/24/21 0419 10/26/21 0411  NA 140 139 138 136 137  K 3.7 3.7 4.1 3.7 3.8  CL 111 109 107 107 103  CO2 23 25 25 24 27   GLUCOSE 127* 144* 122* 150* 120*  BUN 16 15 22 19 16   CREATININE 0.48* 0.51* 0.49* 0.50* 0.46*  CALCIUM 8.5* 8.5* 8.3* 8.2* 8.0*  MG 1.9  --   --   --   --   PHOS 3.3  --   --   --   --    GFR: Estimated Creatinine Clearance: 84.5 mL/min (A) (by C-G formula based on SCr of 0.46 mg/dL (L)). Recent Labs  Lab 10/21/21 0406 10/22/21 0536 10/24/21 0419 10/26/21 0411  WBC 6.1 4.8 6.6 4.9    Liver Function Tests: Recent Labs  Lab 10/20/21 0320 10/21/21 0406 10/22/21 0536 10/23/21 0445  AST 21  --  82* 49*  ALT 23  --  73* 60*  ALKPHOS 87  --  172* 182*  BILITOT 0.5  --  0.5 0.6  PROT 5.8*  --  5.4* 5.1*  ALBUMIN 2.4* 2.1* 2.0* 2.0*   No results for input(s):  "LIPASE", "AMYLASE" in the last 168 hours. No results for input(s): "AMMONIA" in the last 168 hours.  ABG    Component Value Date/Time   PHART 7.44 10/20/2021 0520   PCO2ART 44 10/20/2021 0520   PO2ART 90 10/20/2021 0520   HCO3 29.9 (H) 10/20/2021 0520   TCO2 23 02/28/2018 1643   O2SAT 100 10/20/2021 0520     Coagulation Profile: Recent Labs  Lab 10/23/21 0445  INR 1.1    Cardiac Enzymes: No results for input(s): "CKTOTAL", "CKMB", "CKMBINDEX", "TROPONINI" in the last 168 hours.  HbA1C: No results found for: "HGBA1C"  CBG: Recent Labs  Lab 10/25/21 1950 10/26/21 0056 10/26/21 0501 10/26/21 0739 10/26/21 1118  GLUCAP 111* 109* 107* 120* 111*        The patient is critically ill with multiple organ systems failure and requires high complexity decision making for assessment and support, frequent evaluation and titration of therapies, application of advanced monitoring technologies and extensive interpretation of multiple databases. Critical Care Time devoted to patient care services described in this note is 35 minutes.   Christinia Gully, MD Pulmonary and [place] [phone]   After 7:00 pm call Elink  [phone]

## 2021-10-26 NOTE — TOC Progression Note (Signed)
Transition of Care [facility]) - Progression Note    Patient Details  Name: Larry [name] MRN: [id number] Date of Birth: 04-25-1950  Transition of Care [facility]) CM/SW Contact  Karn Cassis, [place] Phone Number: 10/26/2021, 3:34 PM  Clinical Narrative: LCSW researched contact information for pt's wife and found several numbers. All were not in service, except one and HIPAA compliant voicemail left. LCSW found [place] address for wife and requested [employer] do welfare check. No response back at this time from police department. LCSW left voicemail for pt's neighbor, Vonna Kotyk [phone]) with no response. Pt had indicated prior to being intubated that Vonna Kotyk assisted him with some decisions/other needs. Per palliative, pt told her that his wife had been staying in [place] recently. Discussed with supervisor. Requested that [facility] for possible [facility]. TOC will follow up in AM.       Expected Discharge Plan: Skilled Nursing Facility Barriers to Discharge: Continued Medical Work up  Expected Discharge Plan and Services Expected Discharge Plan: Skilled Nursing Facility     Post Acute Care Choice: Skilled Nursing Facility Living arrangements for the past 2 months: Single Family Home                                       Social Determinants of Health (SDOH) Interventions    Readmission Risk Interventions    03/10/2021    2:14 PM 02/11/2021    1:11 PM  Readmission Risk Prevention Plan  Medication Screening Complete   Transportation Screening Complete Complete  Home Care Screening  Complete  Medication Review (RN CM)  Complete

## 2021-10-26 NOTE — Progress Notes (Signed)
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  He admits to drinking one half of 1/5 of whiskey daily.  He is mostly complaining of pain to his mid and lower back.  His home was noted to be in terrible condition and he was covered and bedbugs and all of his clothing had to be removed and he had to be bathed in the ED.  He was sent for work-up in the ED including CT scans and x-rays and noted to have displaced rib fractures in the posterior left 11th and 12th ribs and mildly displaced left 10th rib fracture and severe hepatic steatosis and left L2 transverse process acute fracture.  He is notably in severe pain and noted to be hyponatremic as well.  Admission was requested for pain management and possible placement.  He has severe problems with frequent falling likely related to alcohol intoxication.   Assessment and Plan: 1) acute hypoxic respiratory failure--- in the setting of aspiration pneumonia and full-blown delirium tremens -Intubated 10/19/2021 -Pulmonary consult appreciated -Currently on Precedex for sedation and as needed fentanyl -10/25/21---  awake and following commands on Precedex -Not doing well with CPAP trial--becomes very tachypneic and demonstrate labored breathing. -Continue SBT's/CPAP while receiving Precedex and as needed clonazepam.  2)multiple rib fractures/Lumbar transverse process fracture/Falls - TLSO brace to assist with severe rib fracture pain  when he comes off vent -Continue as needed analgesics.  3)Aspiration pneumonia with acute hypoxic respiratory failure -In the setting of alcohol abuse/delirium tremens  -Continue IV Unasyn (started on 10/19/2021) -c/n  bronchodilators, -Currently intubated/ventilated -Planning for a total of 8 days of antibiotics.  4)Delirium tremens----currently intubated and sedated -Continue the use of Precedex and clonazepam as per PCCM recommendations.  5)Hypomagnesemia/hyponatremia -- Continue to maintain adequate hydration and further replete electrolytes as needed.  6)Macrocytic anemia - serum B12  Is 973 -Continue repletion of folic acid -Macrocytosis appears to be in the setting of alcohol abuse/consumption.  7)Bedbug infestation - Reportedly noted to be living in substandard horrible conditions according to [facility] report - He has been thoroughly cleansed and clothing changed out in the emergency department - [facility] consultation with social work for APS referral.  8)Severe hepatic steatosis/alcoholic hepatitis - Secondary to chronic alcohol abuse - He should follow-up with GI on outpatient basis for ongoing surveillance.  9)Failure to thrive in adult/severe protein caloric malnutrition - Patient is severely deconditioned and malnourished and living in substandard conditions and we have asked for a physical therapy consultation and TOC social work consultation for possible APS referral and placement in a safe for home environment. -Dietitian consult appreciated  10)Pneumoconiosis, coal, workers' [facility]) - smoker -Continue the use of bronchodilators as needed.  11)HTN--- had hypotension post-intubation -required Levophed for pressure support -Very likely in the setting of sedation -Stable blood pressure: -Patient of pressors.  12)FEN--tolerating Osmolite tube feed with water flushes -Having multiple BMs -Rectal tube in place.  13)Social/Ethics- Unable to reach family/next of kin for  decision-making purposes--DSS/APS notified. -TOC and palliative care helping  to reach family. -Continue to follow progress.  Falls frequently - This is most likely secondary to chronic alcohol intoxication in addition to substandard living conditions - PT evaluation recommending SNF rehab  - Fall precautions recommended --working on SNF rehab placement   CRITICAL CARE Performed by: Vassie Loll   Total critical care time: 44 minutes  Critical care time was exclusive of separately billable procedures and treating other patients.  Critical care was necessary to treat or prevent imminent or life-threatening deterioration. -Remains intubated --Precedex/and prn fentanyl -Vent--PRVC /40%/5/18/640 TV  Critical care was time spent personally by me on the following activities: development of treatment plan with patient and/or surrogate as well as nursing, discussions with consultants, evaluation of patient's response to treatment, examination of patient, obtaining history from patient or surrogate, ordering and performing treatments and interventions, ordering and review of laboratory studies, ordering and review of radiographic studies, pulse oximetry and re-evaluation of patient's condition.   DVT prophylaxis: enoxaparin Code Status: full code Family Communication: none present  Disposition: Currently intubated - Not medically ready for discharge.  Difficult to extubate. Status is: Inpatient Remains inpatient appropriate because: IV fluids, electrolyte replacement , intubated and sedated   Consultants: palliative care, PT /pulmonary critical care  Procedures:  10/19/2021 intubation Antimicrobials:  Unasyn started on 10/19/2021 (planning for a total of 8 days).  Subjective: Remains intubated, mechanically ventilated and is still experiencing episodes of tachypnea/increased labored breathing while attempting SBT/CPAP.   Currently receiving Precedex and clonazepam.     Objective: Vitals:   10/26/21 1600 10/26/21 1608 10/26/21 1800 10/26/21 1900  BP: 123/68  139/68   Pulse: 71  74   Resp: 17  (!) 21   Temp:  98.7 F (37.1 C)  (!) 96.7 F (35.9 C)  TempSrc:  Axillary  Axillary  SpO2: 99%  100%   Weight:      Height:        Intake/Output Summary (Last 24 hours) at 10/26/2021 1958 Last data filed at 10/26/2021 1814 Gross per 24 hour  Intake 3209.48 ml  Output 2475 ml  Net 734.48 ml   Filed Weights   10/24/21 0500 10/25/21 0621 10/26/21 0458  Weight: 70.6 kg 69.1 kg 69.5 kg   Physical Exam General exam: Alert, awake, able to follow simple commands; still intubated, mechanically ventilated and experiencing tachypnea while attempting SBT/CPAP. Respiratory system: Positive rhonchi bilaterally, no active wheezing appreciated on exam. Cardiovascular system:RRR.  No rubs, no gallops, no JVD. Gastrointestinal system: Abdomen is nondistended, soft and nontender. No organomegaly or masses felt. Normal bowel sounds heard. Central nervous system: Alert and oriented.  Moving 4 limbs spontaneously.  Limited examination while intubated and receiving sedation. Extremities: No cyanosis or clubbing; no pedal edema. Skin: No petechiae.  Rectal tube in place. Psychiatry: Judgement and insight appear stable; but limited examination due to sedation/mechanical ventilation.  Data Reviewed: I have personally reviewed following labs and imaging studies  CBC: Recent Labs  Lab 10/20/21 0320 10/21/21 0406 10/22/21 0536 10/24/21 0419 10/26/21 0411  WBC 7.5 6.1 4.8 6.6 4.9  HGB 10.2* 8.5* 8.6* 8.8* 8.6*  HCT 31.2* 26.3* 26.3* 26.9* 27.5*  MCV 104.7* 104.8* 103.5* 103.9* 105.0*  PLT 298 296 302 305 239    Basic Metabolic Panel: Recent Labs  Lab 10/21/21 0406 10/22/21 0536 10/23/21 0445 10/24/21 0419 10/26/21 0411  NA 140 139 138 136 137  K 3.7 3.7 4.1 3.7 3.8  CL 111 109 107 107 103  CO2 23 25 25 24  27  GLUCOSE 127* 144* 122* 150* 120*  BUN 16 15 22  19 16   CREATININE 0.48* 0.51* 0.49* 0.50* 0.46*  CALCIUM 8.5* 8.5* 8.3* 8.2* 8.0*  MG 1.9  --   --   --   --   PHOS 3.3  --   --   --   --     CBG: Recent Labs  Lab 10/26/21 0056 10/26/21 0501 10/26/21 0739 10/26/21 1118 10/26/21 1605  GLUCAP 109* 107* 120* 111* 112*    Recent Results (from the past 240 hour(s))  Culture, Respiratory w Gram Stain     Status: None   Collection Time: 10/20/21  3:00 PM   Specimen: Tracheal Aspirate; Respiratory  Result Value Ref Range Status   Specimen Description   Final    TRACHEAL ASPIRATE Performed at [facility], [address] [place], [place]    Special Requests   Final    NONE Performed at [facility], [address] [place], [place]    Gram Stain   Final    NO SQUAMOUS EPITHELIAL CELLS SEEN FEW WBC SEEN FEW GRAM POSITIVE COCCI    Culture   Final    MODERATE Normal respiratory flora-no Staph aureus or Pseudomonas seen Performed at [facility], [address], [place], [place]    Report Status 10/23/2021 FINAL  Final     Radiology Studies: DG CHEST PORT 1 VIEW  Result Date: 10/26/2021 CLINICAL DATA:  Weakness EXAM: PORTABLE CHEST 1 VIEW COMPARISON:  Chest x-ray dated September 24, 2021 FINDINGS: Cardiac and mediastinal contours are unchanged. Numerous bilateral calcified granulomas. Unchanged retrocardiac opacity. No new focal consolidation. No large pleural effusion or pneumothorax. IMPRESSION: Unchanged retrocardiac opacity, likely due to atelectasis. Electronically Signed   By: September 26, 2021 M.D.   On: 10/26/2021 08:08    Scheduled Meds:  Chlorhexidine Gluconate Cloth  6 each Topical Daily   cholecalciferol  1,000 Units Per Tube Daily   clonazePAM  1 mg Per Tube BID   docusate  200 mg Per Tube Daily   enoxaparin (LOVENOX) injection  40 mg Subcutaneous Q24H   feeding supplement (PROSource TF)  45 mL Per Tube TID   free water  200 mL Per Tube Q4H   nicotine  21 mg Transdermal Daily    mouth rinse  15 mL Mouth Rinse Q2H   pantoprazole sodium  40 mg Per Tube Daily   Continuous Infusions:  sodium chloride Stopped (10/22/21 0802)   ampicillin-sulbactam (UNASYN) IV Stopped (10/26/21 1450)   dexmedetomidine (PRECEDEX) IV infusion 0.6 mcg/kg/hr (10/26/21 1814)   feeding supplement (OSMOLITE 1.2 CAL) 50 mL/hr at 10/26/21 1814    LOS: 13 days   12/27/21, MD How to contact the [facility] Attending or Consulting provider 7A - 7P or covering provider during after hours 7P -7A, for this patient?  Check the care team in [facility] and look for a) attending/consulting TRH provider listed and b) the [facility] team listed Log into [email or web address] and use [place] universal password to access. If you do not have the password, please contact the hospital operator. Locate the [facility] provider you are looking for under [facility] and page to a number that you can be directly reached. If you still have difficulty reaching the provider, please page the [facility] (Director on Call) for the Hospitalists listed on amion for assistance.  10/26/2021, 7:58 PM

## 2021-10-26 NOTE — Progress Notes (Signed)
RT attempted to wean patient for the second time today because now he is wide awake. Attempted wean on PS 12 / Peep 5. Patient breathing in the 40s. Placed back on full ventilator support.

## 2021-10-26 NOTE — Progress Notes (Signed)
Patient placed on wean 12/5 by MD Wert. Patient breathing 8-24 times a minute with tidal volumes [phone]. Patient stayed on wean until 1242. MD wanted patient placed back on full support because of RR in the 50s. Plan is to extubate tomorrow, possibly to BiPAP.

## 2021-10-26 NOTE — Progress Notes (Signed)
Nutrition Follow-up  DOCUMENTATION CODES:   Severe malnutrition in context of social or environmental circumstances, Underweight  INTERVENTION:  -Continue Osmolite 1.2 @ 20 ml/hr to advance 10 ml/hr q 6 hr to goal rate of 50 ml/hr (1200 ml) per tube +-ProSource TF 45 ml TID per tube   Provides: 1560 kcal, 99 gr protein and 984 ml water.    If no IVF- add free water 200 ml TID per tube  NUTRITION DIAGNOSIS:   Severe Malnutrition related to social / environmental circumstances as evidenced by severe fat depletion, severe muscle depletion.  - enteral feeding provided  GOAL:   Patient will meet greater than or equal to 90% of their needs  -ongoing   MONITOR:   PO intake, Supplement acceptance, Labs, Weight trends    ASSESSMENT:  Pt admitted from home with uncontrolled pain and weakness. 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  On 6/29 patient started in Osmolite 1.2 @ 20 ml/hr to advance to goal rate of 50 ml/hr (1200 ml). Provides: 1440 kcal, 66 gr protein and 984 ml water. Patient will need additional protein to meet needs- Prosource TF 45 ml TID per tube.   7/3 Patient remains intubated on ventilator support. Failed weaning attempt this morning. Discussed with nursing. Patient continues to tolerate tube feeding (regimen noted below). Flexiseal placed 7/2 and no skin breakdown reported. Weight gain noted.  7/5 Patient eyes open today during RD visit. Nursing in room providing care. He continues on enteral feeding at goal. No problems with tolerance. Possible wean attempt tomorrow. I/O's reviewed.   MV: 15.1 L/min Temp (24hrs), Avg:98.4 F (36.9 C), Min:97.8 F (36.6 C), Max:99.1 F (37.3 C) MAP-86  Precedex: 6.5 ml/hr (171.6 kcal)   Intake/Output Summary (Last 24 hours) at 10/26/2021 1539 Last data filed at  10/26/2021 1434 Gross per 24 hour  Intake 2934.71 ml  Output 3550 ml  Net -615.29 ml   Since admission- +10.678 liters     Latest Ref Rng & Units 10/26/2021    4:11 AM 10/24/2021    4:19 AM 10/23/2021    4:45 AM  BMP  Glucose 70 - 99 mg/dL 937  169  678   BUN 8 - 23 mg/dL 16  19  22    Creatinine 0.61 - 1.24 mg/dL  9.38  1.01   Sodium 135 - 145 mmol/L 137  136  138   Potassium 3.5 - 5.1 mmol/L 3.8  3.7  4.1   Chloride 98 - 111 mmol/L 103  107  107   CO2 22 - 32 mmol/L 27  24  25    Calcium 8.9 - 10.3 mg/dL 8.0  8.2  8.3      Diet Order:   Diet Order             Diet NPO time specified Except for: Sips with Meds  Diet effective now                   EDUCATION NEEDS:   No education needs have been identified at this time  Skin:  Skin Assessment: Reviewed RN Assessment  Last BM:  7/5 small type 7   Height:   Ht Readings from Last 1 Encounters:  10/23/21 6\' 2"  (1.88 m)    Weight:   Wt Readings from Last 1 Encounters:  10/26/21 69.5 kg  Admit weight- 61.2 kg  Ideal Body  Weight:   86 kg  BMI:  Body mass index is 19.67 kg/m.  Estimated Nutritional Needs:   Kcal:  1674  Protein:  90-100 gr  Fluid:  >1600 ml daily   Royann Shivers MS,RD,CSG,LDN Contact: Loretha Stapler

## 2021-10-27 DIAGNOSIS — T17908D Unspecified foreign body in respiratory tract, part unspecified causing other injury, subsequent encounter: Secondary | ICD-10-CM | POA: Diagnosis not present

## 2021-10-27 DIAGNOSIS — J9601 Acute respiratory failure with hypoxia: Secondary | ICD-10-CM | POA: Diagnosis not present

## 2021-10-27 DIAGNOSIS — J9602 Acute respiratory failure with hypercapnia: Secondary | ICD-10-CM | POA: Diagnosis not present

## 2021-10-27 DIAGNOSIS — Z515 Encounter for palliative care: Secondary | ICD-10-CM | POA: Diagnosis not present

## 2021-10-27 DIAGNOSIS — E43 Unspecified severe protein-calorie malnutrition: Secondary | ICD-10-CM

## 2021-10-27 DIAGNOSIS — F1010 Alcohol abuse, uncomplicated: Secondary | ICD-10-CM | POA: Diagnosis not present

## 2021-10-27 DIAGNOSIS — R52 Pain, unspecified: Secondary | ICD-10-CM | POA: Diagnosis not present

## 2021-10-27 DIAGNOSIS — Z7189 Other specified counseling: Secondary | ICD-10-CM | POA: Diagnosis not present

## 2021-10-27 DIAGNOSIS — I10 Essential (primary) hypertension: Secondary | ICD-10-CM | POA: Diagnosis not present

## 2021-10-27 DIAGNOSIS — S2243XA Multiple fractures of ribs, bilateral, initial encounter for closed fracture: Secondary | ICD-10-CM | POA: Diagnosis not present

## 2021-10-27 LAB — GLUCOSE, CAPILLARY
Glucose-Capillary: 138 mg/dL — ABNORMAL HIGH (ref 70–99)
Glucose-Capillary: 106 mg/dL — ABNORMAL HIGH (ref 70–99)
Glucose-Capillary: 88 mg/dL (ref 70–99)
Glucose-Capillary: 110 mg/dL — ABNORMAL HIGH (ref 70–99)
Glucose-Capillary: 85 mg/dL (ref 70–99)

## 2021-10-27 MED ORDER — FOLIC ACID 1 MG PO TABS
1.0000 mg | ORAL_TABLET | Freq: Every day | ORAL | Status: DC
  Administered 2021-10-27 – 2021-11-02 (×7): 1 mg via ORAL
  Filled 2021-10-27 (×7): qty 1

## 2021-10-27 MED ORDER — CLONAZEPAM 0.5 MG PO TABS
1.0000 mg | ORAL_TABLET | Freq: Two times a day (BID) | ORAL | Status: DC
  Administered 2021-10-27 – 2021-11-02 (×12): 1 mg via ORAL
  Filled 2021-10-27 (×12): qty 2

## 2021-10-27 MED ORDER — IPRATROPIUM-ALBUTEROL 0.5-2.5 (3) MG/3ML IN SOLN
3.0000 mL | Freq: Four times a day (QID) | RESPIRATORY_TRACT | Status: DC
  Administered 2021-10-28 – 2021-10-31 (×16): 3 mL via RESPIRATORY_TRACT
  Filled 2021-10-27 (×17): qty 3

## 2021-10-27 MED ORDER — THIAMINE HCL 100 MG/ML IJ SOLN
100.0000 mg | Freq: Every day | INTRAMUSCULAR | Status: DC
  Administered 2021-10-28: 100 mg via INTRAVENOUS
  Filled 2021-10-27: qty 2

## 2021-10-27 MED ORDER — VITAMIN B-12 1000 MCG PO TABS
1000.0000 ug | ORAL_TABLET | Freq: Every day | ORAL | Status: DC
  Administered 2021-10-27 – 2021-11-02 (×7): 1000 ug via ORAL
  Filled 2021-10-27 (×7): qty 1

## 2021-10-27 MED ORDER — LORAZEPAM 2 MG/ML IJ SOLN
1.0000 mg | INTRAMUSCULAR | Status: AC | PRN
  Filled 2021-10-27: qty 1

## 2021-10-27 MED ORDER — ADULT MULTIVITAMIN W/MINERALS CH
1.0000 | ORAL_TABLET | Freq: Every day | ORAL | Status: DC
  Administered 2021-10-27 – 2021-11-02 (×7): 1 via ORAL
  Filled 2021-10-27 (×7): qty 1

## 2021-10-27 MED ORDER — CLONIDINE HCL 0.1 MG PO TABS: 0.1000 mg | ORAL_TABLET | Freq: Three times a day (TID) | ORAL | Status: DC

## 2021-10-27 MED ORDER — IPRATROPIUM-ALBUTEROL 0.5-2.5 (3) MG/3ML IN SOLN
3.0000 mL | Freq: Four times a day (QID) | RESPIRATORY_TRACT | Status: DC
  Administered 2021-10-27 (×2): 3 mL via RESPIRATORY_TRACT
  Filled 2021-10-27 (×2): qty 3

## 2021-10-27 MED ORDER — THIAMINE HCL 100 MG PO TABS
100.0000 mg | ORAL_TABLET | Freq: Every day | ORAL | Status: DC
  Administered 2021-10-27 – 2021-11-02 (×6): 100 mg via ORAL
  Filled 2021-10-27 (×6): qty 1

## 2021-10-27 MED ORDER — PANTOPRAZOLE 2 MG/ML SUSPENSION
40.0000 mg | Freq: Every day | ORAL | Status: DC
  Administered 2021-10-28 – 2021-11-02 (×6): 40 mg via ORAL
  Filled 2021-10-27 (×6): qty 20

## 2021-10-27 MED ORDER — CLONIDINE HCL 0.1 MG PO TABS
0.1000 mg | ORAL_TABLET | Freq: Three times a day (TID) | ORAL | Status: DC
  Administered 2021-10-27 – 2021-11-02 (×18): 0.1 mg via ORAL
  Filled 2021-10-27 (×18): qty 1

## 2021-10-27 MED ORDER — OXYCODONE HCL 5 MG PO TABS
5.0000 mg | ORAL_TABLET | Freq: Four times a day (QID) | ORAL | Status: DC | PRN
  Administered 2021-10-27 – 2021-11-01 (×7): 5 mg via ORAL
  Filled 2021-10-27 (×7): qty 1

## 2021-10-27 NOTE — Procedures (Signed)
Extubation Procedure Note  Patient Details:   Name: Larry [name] DOB: 02-May-1950 MRN: [id number]   Airway Documentation:    Vent end date: (not recorded) Vent end time: (not recorded)   Evaluation  O2 sats: stable throughout Complications: No apparent complications Patient did tolerate procedure well. Bilateral Breath Sounds: Diminished   Yes  Katheren Shams 10/27/2021, 9:51 AM

## 2021-10-27 NOTE — Evaluation (Signed)
Clinical/Bedside Swallow Evaluation Patient Details  Name: Larry [name] MRN: [id number] Date of Birth: 20-May-1950  Today's Date: 10/27/2021 Time: SLP Start Time (ACUTE ONLY): 1345 SLP Stop Time (ACUTE ONLY): 1420 SLP Time Calculation (min) (ACUTE ONLY): 35 min  Past Medical History:  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   Hypertension    Shingles    Shortness of breath dyspnea    SOB with exertion   Thyroid disease    hypothyroidism   Past Surgical History:  Past Surgical History:  Procedure Laterality Date   CATARACT EXTRACTION W/ INTRAOCULAR LENS  IMPLANT, BILATERAL     KNEE SURGERY     ORIF HUMERUS FRACTURE Right 09/24/2014   Procedure: OPEN REDUCTION INTERNAL FIXATION (ORIF) PROXIMAL HUMERUS FRACTURE;  Surgeon: Jones Broom, MD;  Location: [facility] OR;  Service: Orthopedics;  Laterality: Right;   TONSILLECTOMY     HPI:  71 year old EtOH user, smoker BIBEMS on 6/22 for generalized weakness and multiple falls over the past several months.  He had pain in his mid to lower back .  His home was in a terrible condition, there were bedbugs.  He was severely malnourished initial imaging showed displaced rib fracture posterior left 10th, 11th and 12th ribs and left T2 transverse process fracture.  Initial labs were significant for hyponatremia.  Social work and APS referrals were made  On 6/28 he was noted to be confused, disoriented with tremors consistent with DTs.  CIWA protocol was started and he was transferred to stepdown.  He was noted to be febrile, coughing and dyspnea consistent with aspiration.  He required intubation and mechanical ventilation, hypotensive requiring fluid bolus and Levophed drip. Pt extubated this AM and BSE requested.    Assessment / Plan / Recommendation  Clinical Impression  Clinical swallow evaluation completed at bedside. Nursing staff had just completed oral  care and reported that Pt was requesting water. Pt was extubated earlier today and has a strong, congested cough. Vocal intensity is reduced and pitch low (Pt intubated ~9 days). Pt assessed with ice chips, water, and puree. Pt with occasional cues needed for labial closure. Pt with multiple swallows elicited for each bite/sip and occasional delayed cough. Pt has a strong cough however and is able to expectorate with use of suction. Pt repotedly has been coughing up phlegm since extubation. Pt appreciative of all po trials. Recommend caution with starting diet today due to prolonged extubation, fluctuating mental status, poor funtional reserve, however can recommend PO medications whole or crushed in puree and OK for small sips of water or ice chips after oral care and when Pt is alert and upright. Above shared with nursing and Pt and all are in agreement with plan of care. Keep suction at bedside. SLP will f/u tomorrow. SLP Visit Diagnosis: Dysphagia, unspecified (R13.10)    Aspiration Risk  Mild aspiration risk;Risk for inadequate nutrition/hydration    Diet Recommendation NPO except meds;Ice chips PRN after oral care;Free water protocol after oral care   Medication Administration: Whole meds with puree Postural Changes: Seated upright at 90 degrees;Remain upright for at least 30 minutes after po intake    Other  Recommendations Oral Care Recommendations: Oral care prior to ice chip/H20;Staff/trained caregiver to provide oral care Other Recommendations: Clarify dietary restrictions    Recommendations for follow up therapy are one component of a multi-disciplinary discharge planning process, led by  the attending physician.  Recommendations may be updated based on patient status, additional functional criteria and insurance authorization.  Follow up Recommendations  (pending clinical course)      Assistance Recommended at Discharge Frequent or constant Supervision/Assistance  Functional Status  Assessment Patient has had a recent decline in their functional status and demonstrates the ability to make significant improvements in function in a reasonable and predictable amount of time.  Frequency and Duration min 2x/week  1 week       Prognosis Prognosis for Safe Diet Advancement: Good      Swallow Study   General Date of Onset: 11/12/21 HPI: 71 year old EtOH user, smoker BIBEMS on 6/22 for generalized weakness and multiple falls over the past several months.  He had pain in his mid to lower back .  His home was in a terrible condition, there were bedbugs.  He was severely malnourished initial imaging showed displaced rib fracture posterior left 10th, 11th and 12th ribs and left T2 transverse process fracture.  Initial labs were significant for hyponatremia.  Social work and APS referrals were made  On 6/28 he was noted to be confused, disoriented with tremors consistent with DTs.  CIWA protocol was started and he was transferred to stepdown.  He was noted to be febrile, coughing and dyspnea consistent with aspiration.  He required intubation and mechanical ventilation, hypotensive requiring fluid bolus and Levophed drip. Pt extubated this AM and BSE requested. Type of Study: Bedside Swallow Evaluation Previous Swallow Assessment: None on record Diet Prior to this Study: NPO Temperature Spikes Noted: No Respiratory Status: Nasal cannula History of Recent Intubation: Yes Length of Intubations (days): 9 days Date extubated: 10/27/21 Behavior/Cognition: Alert;Cooperative;Pleasant mood;Requires cueing (poor recall per RN) Oral Cavity Assessment: Within Functional Limits Oral Care Completed by SLP: Recent completion by staff Oral Cavity - Dentition: Edentulous Vision: Functional for self-feeding Self-Feeding Abilities: Total assist Patient Positioning: Upright in bed Baseline Vocal Quality: Low vocal intensity (mild hoarseness) Volitional Cough: Congested;Strong Volitional Swallow:  Able to elicit    Oral/Motor/Sensory Function Overall Oral Motor/Sensory Function: Within functional limits   Ice Chips Ice chips: Within functional limits Presentation: Spoon   Thin Liquid Thin Liquid: Impaired Presentation: Cup;Spoon;Straw Oral Phase Impairments: Reduced labial seal (occasional labial spillage with spoon) Pharyngeal  Phase Impairments: Decreased hyoid-laryngeal movement;Multiple swallows;Cough - Delayed    Nectar Thick Nectar Thick Liquid: Not tested   Honey Thick Honey Thick Liquid: Not tested   Puree Puree: Within functional limits   Solid     Solid: Not tested     Thank you,  Havery Moros, CCC-SLP [phone]  Larry [name] 10/27/2021,3:53 PM

## 2021-10-27 NOTE — Progress Notes (Signed)
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  He admits to drinking one half of 1/5 of whiskey daily.  He is mostly complaining of pain to his mid and lower back.  His home was noted to be in terrible condition and he was covered and bedbugs and all of his clothing had to be removed and he had to be bathed in the ED.  He was sent for work-up in the ED including CT scans and x-rays and noted to have displaced rib fractures in the posterior left 11th and 12th ribs and mildly displaced left 10th rib fracture and severe hepatic steatosis and left L2 transverse process acute fracture.  He is notably in severe pain and noted to be hyponatremic as well.  Admission was requested for pain management and possible placement.  He has severe problems with frequent falling likely related to alcohol intoxication.   Assessment and Plan: 1) acute hypoxic respiratory failure--- in the setting of aspiration pneumonia and full-blown delirium tremens -Intubated 10/19/2021 -Pulmonary consult appreciated -Currently on Precedex for sedation and as needed fentanyl -Calmer and following commands. -Continue SBT's/CPAP while receiving Precedex and as needed clonazepam. -Hopefully will be able to extubate later today.  2)multiple rib fractures/Lumbar transverse process fracture/Falls - TLSO brace to assist with severe rib fracture pain when he comes off vent -Continue as needed  analgesics.  3)Aspiration pneumonia with acute hypoxic respiratory failure -In the setting of alcohol abuse/delirium tremens  -Continue IV Unasyn (started on 10/19/2021) -c/n  bronchodilators, -Currently intubated/ventilated -Planning for a total of 8 days of antibiotics. -Has remained afebrile.  4)Delirium tremens----currently intubated and sedated -Continue the use of Precedex and clonazepam as per PCCM recommendations.  5)Hypomagnesemia/hyponatremia -- Continue to maintain adequate hydration and further replete electrolytes as needed. -Repeat magnesium, phosphorus and basic metabolic panel.  6)Macrocytic anemia - serum B12  Is 973 -Continue repletion of folic acid -Macrocytosis appears to be in the setting of alcohol abuse/consumption.  7)Bedbug infestation - Reportedly noted to be living in substandard horrible conditions according to [facility] EMS report - He has been thoroughly cleansed and clothing changed out in the emergency department - [facility] consultation with social work for APS referral.  8)Severe hepatic steatosis/alcoholic hepatitis - Secondary to chronic alcohol abuse - He should follow-up with GI on outpatient basis for ongoing surveillance.  9)Failure to thrive in adult/severe protein caloric malnutrition - Patient is severely deconditioned and malnourished and living in substandard conditions and we have asked for a physical therapy consultation and TOC social work consultation for possible APS referral and placement in a safe for home environment. -Dietitian consult appreciated; will follow recommendations.  10)Pneumoconiosis, coal, workers' [facility]) - smoker -Continue the use of bronchodilators as needed.  11)HTN--- had hypotension post-intubation -required Levophed for pressure support -Very likely in the setting of sedation -Stable blood pressure: -Patient of pressors.  12)FEN--tolerating Osmolite tube feed with water flushes -Having multiple BMs -Rectal tube  in place.  13)Social/Ethics- -I have been unable to reach family/next of kin for  decision-making purposes--DSS/APS notified. -TOC and palliative care helping to reach family. -Continue to follow progress. -Further discussion directly with patient after extubation achieved  Falls frequently - This is most likely secondary to chronic alcohol intoxication in addition to substandard living conditions - PT evaluation recommending SNF rehab  - Fall precautions recommended --working on SNF rehab placement; following patient is medically ready.  DVT prophylaxis: enoxaparin Code Status: full code Family Communication: none present  Disposition: Currently intubated - Not medically ready for discharge.  Difficult to extubate. Status is: Inpatient Remains inpatient appropriate because: IV fluids, electrolyte replacement , intubated and sedated   Consultants: palliative care, PT /pulmonary critical care  Procedures:  10/19/2021 intubation Antimicrobials:  Unasyn started on 10/19/2021 (planning for a total of 8 days).  Subjective: Remains intubated and mechanically ventilated; no fever, no chest pain, no nausea, no vomiting, no overnight events.  Patient appears to be Calmer on today's examination and so far tolerating SBT.  Plan is for extubation later today.  Objective: Vitals:   10/27/21 1206 10/27/21 1300 10/27/21 1445 10/27/21 1600  BP: (!) 153/91 (!) 152/96  (!) 184/92  Pulse: (!) 104 95  (!) 110  Resp: 16 (!) 33  19  Temp: 98.2 F (36.8 C)   98.8 F (37.1 C)  TempSrc: Axillary   Oral  SpO2: 92% 99% 99% 97%  Weight:      Height:        Intake/Output Summary (Last 24 hours) at 10/27/2021 1803 Last data filed at 10/27/2021 1640 Gross per 24 hour  Intake 2159.13 ml  Output 1750 ml  Net 409.13 ml   Filed Weights   10/25/21 0621 10/26/21 0458 10/27/21 0500  Weight: 69.1 kg 69.5 kg 70 kg   Physical Exam General exam: Intubated/mechanically ventilated.  Alert, awake, following  commands and Calmer; pursuing SBT and so far tolerating it better. Respiratory system: Fair air movement bilaterally; no wheezing or crackles on exam.  Positive rhonchi. Cardiovascular system:RRR. No murmurs, rubs, gallops.  No JVD on exam. Gastrointestinal system: Abdomen is nondistended, soft and nontender. No organomegaly or masses felt. Normal bowel sounds heard. Central nervous system: Limited exam; no focal neurological deficits.  Appears to follow commands appropriate. Extremities: No cyanosis or clubbing; no edema. Skin: No petechiae.  Rectal tube in place.  Loose stools appreciated in rectal backup. Psychiatry: Judgement and insight limited for evaluation given current mechanical ventilation and sedation.  Data Reviewed: I have personally reviewed following labs and imaging studies  CBC: Recent Labs  Lab 10/21/21 0406 10/22/21 0536 10/24/21 0419 10/26/21 0411  WBC 6.1 4.8 6.6 4.9  HGB 8.5* 8.6* 8.8* 8.6*  HCT 26.3* 26.3* 26.9* 27.5*  MCV 104.8* 103.5* 103.9* 105.0*  PLT 296 302 305 239    Basic Metabolic Panel: Recent Labs  Lab 10/21/21 0406 10/22/21 0536 10/23/21 0445 10/24/21 0419 10/26/21 0411  NA 140 139 138 136 137  K 3.7 3.7 4.1 3.7 3.8  CL 111 109 107 107 103  CO2 23 25 25 24 27   GLUCOSE 127* 144* 122* 150* 120*  BUN 16 15 22 19 16   CREATININE 0.48* 0.51* 0.49* 0.50* 0.46*  CALCIUM 8.5* 8.5* 8.3* 8.2* 8.0*  MG 1.9  --   --   --   --   PHOS 3.3  --   --   --   --     CBG: Recent Labs  Lab 10/26/21 2023 10/27/21 0423 10/27/21 0742 10/27/21 1204 10/27/21 1602  GLUCAP 117* 138* 106* 88  110*    Recent Results (from the past 240 hour(s))  Culture, Respiratory w Gram Stain     Status: None   Collection Time: 10/20/21  3:00 PM   Specimen: Tracheal Aspirate; Respiratory  Result Value Ref Range Status   Specimen Description   Final    TRACHEAL ASPIRATE Performed at [facility], [address], [place], [place] [address]    Special Requests    Final    NONE Performed at [facility], [address] [place], [place] [address]    Gram Stain   Final    NO SQUAMOUS EPITHELIAL CELLS SEEN FEW WBC SEEN FEW GRAM POSITIVE COCCI    Culture   Final    MODERATE Normal respiratory flora-no Staph aureus or Pseudomonas seen Performed at [facility] [address], [place], [place] [address]    Report Status 10/23/2021 FINAL  Final     Radiology Studies: DG CHEST PORT 1 VIEW  Result Date: 10/26/2021 CLINICAL DATA:  Weakness EXAM: PORTABLE CHEST 1 VIEW COMPARISON:  Chest x-ray dated September 24, 2021 FINDINGS: Cardiac and mediastinal contours are unchanged. Numerous bilateral calcified granulomas. Unchanged retrocardiac opacity. No new focal consolidation. No large pleural effusion or pneumothorax. IMPRESSION: Unchanged retrocardiac opacity, likely due to atelectasis. Electronically Signed   By: Yetta Glassman M.D.   On: 10/26/2021 08:08    Scheduled Meds:  Chlorhexidine Gluconate Cloth  6 each Topical Daily   cholecalciferol  1,000 Units Per Tube Daily   clonazePAM  1 mg Oral BID   cloNIDine  0.1 mg Oral TID   enoxaparin (LOVENOX) injection  40 mg Subcutaneous [id number]   folic acid  1 mg Oral Daily   ipratropium-albuterol  3 mL Nebulization QID   multivitamin with minerals  1 tablet Oral Daily   nicotine  21 mg Transdermal Daily   mouth rinse  15 mL Mouth Rinse Q2H   [START ON 10/28/2021] pantoprazole sodium  40 mg Oral Daily   thiamine  100 mg Oral Daily   Or   thiamine  100 mg Intravenous Daily   vitamin B-12  1,000 mcg Oral Daily   Continuous Infusions:  sodium chloride Stopped (10/22/21 0802)   ampicillin-sulbactam (UNASYN) IV Stopped (10/27/21 1401)    LOS: 14 days   Barton Dubois, MD How to contact the [facility] Attending or Consulting provider [place] or covering provider during after hours [place], for this patient?  Check the care team in [facility] and look for a) attending/consulting TRH provider listed and b) the [facility] team  listed Log into [email or web address] and use [place]'s universal password to access. If you do not have the password, please contact the hospital operator. Locate the [facility] provider you are looking for under [facility] and page to a number that you can be directly reached. If you still have difficulty reaching the provider, please page the [facility] (Director on Call) for the Hospitalists listed on amion for assistance.  10/27/2021, 6:03 PM

## 2021-10-27 NOTE — TOC Progression Note (Addendum)
Transition of Care [facility]) - Progression Note    Patient Details  Name: [name] MRN: [id number] Date of Birth: 1951-04-07  Transition of Care [facility] CM/SW Contact  Annice Needy, LCSW Phone Number: 10/27/2021, 11:47 AM  Clinical Narrative:    TOC spoke with Alden Server with [employer]. Alden Server advised that patient would have to receive a trach for his insurance to authorize LTACH.  Patient extubated today. Left message for spouse, Nicole Cella, [phone], requesting return contact.  Spoke with neighbor, Vonna Kotyk, who indicated that none of his family "deals with him (patient)." Vonna Kotyk stated that he has been trying to get in tough with some of the "people he deals with" but has not been successful.  Patient's wife lives in [place] and has blocked patient's number. Alcario Drought with [facility] [phone], advised that since patient has been extubated he may no longer meet criteria for LTACH. Advised that she would follow patient through weekend and contact TOC on Monday.    Expected Discharge Plan: Skilled Nursing Facility Barriers to Discharge: Continued Medical Work up  Expected Discharge Plan and Services Expected Discharge Plan: Skilled Nursing Facility     Post Acute Care Choice: Skilled Nursing Facility Living arrangements for the past 2 months: Single Family Home                                       Social Determinants of Health (SDOH) Interventions    Readmission Risk Interventions    03/10/2021    2:14 PM 02/11/2021    1:11 PM  Readmission Risk Prevention Plan  Medication Screening Complete   Transportation Screening Complete Complete  Home Care Screening  Complete  Medication Review (RN CM)  Complete

## 2021-10-27 NOTE — Progress Notes (Signed)
Pharmacy Antibiotic Note  Larry [name] a 71 y.o. male admitted on 10/27/2021 with aspiration pneumonia.  Pharmacy has been consulted for unasyn dosing. End date 7/8 per Dr Gwenlyn Perking, discontinuing consult.  Plan: Continue Unasyn 3gm iv q6h until 7/8  Medical History: 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   Hypertension    Shingles    Shortness of breath dyspnea    SOB with exertion   Thyroid disease    hypothyroidism    Allergies:  No Known Allergies  Filed Weights   10/25/21 0621 10/26/21 0458 10/27/21 0500  Weight: 69.1 kg (152 lb 5.4 oz) 69.5 kg (153 lb 3.5 oz) 70 kg (154 lb 5.2 oz)       Latest Ref Rng & Units 10/26/2021    4:11 AM 10/24/2021    4:19 AM 10/22/2021    5:36 AM  CBC  WBC 4.0 - 10.5 K/uL 4.9  6.6  4.8   Hemoglobin 13.0 - 17.0 g/dL 8.6  8.8  8.6   Hematocrit 39.0 - 52.0 % 27.5  26.9  26.3   Platelets 150 - 400 K/uL 239  305  302      Estimated Creatinine Clearance: 85.1 mL/min (A) (by C-G formula based on SCr of 0.46 mg/dL (L)).  Antibiotics Given (last 72 hours)     Date/Time Action Medication Dose Rate   10/24/21 1446 New Bag/Given   Ampicillin-Sulbactam (UNASYN) 3 g in sodium chloride 0.9 % 100 mL IVPB 3 g 200 mL/hr   10/24/21 1942 New Bag/Given   Ampicillin-Sulbactam (UNASYN) 3 g in sodium chloride 0.9 % 100 mL IVPB 3 g 200 mL/hr   10/25/21 0226 New Bag/Given   Ampicillin-Sulbactam (UNASYN) 3 g in sodium chloride 0.9 % 100 mL IVPB 3 g 200 mL/hr   10/25/21 0746 New Bag/Given   Ampicillin-Sulbactam (UNASYN) 3 g in sodium chloride 0.9 % 100 mL IVPB 3 g 200 mL/hr   10/25/21 1412 New Bag/Given   Ampicillin-Sulbactam (UNASYN) 3 g in sodium chloride 0.9 % 100 mL IVPB 3 g 200 mL/hr   10/25/21 1936 New Bag/Given   Ampicillin-Sulbactam (UNASYN) 3 g in sodium chloride 0.9 % 100 mL IVPB 3 g 200 mL/hr   10/26/21 0239 New Bag/Given   Ampicillin-Sulbactam (UNASYN)  3 g in sodium chloride 0.9 % 100 mL IVPB 3 g 200 mL/hr   10/26/21 0753 New Bag/Given   Ampicillin-Sulbactam (UNASYN) 3 g in sodium chloride 0.9 % 100 mL IVPB 3 g 200 mL/hr   10/26/21 1420 New Bag/Given   Ampicillin-Sulbactam (UNASYN) 3 g in sodium chloride 0.9 % 100 mL IVPB 3 g 200 mL/hr   10/26/21 2101 New Bag/Given   Ampicillin-Sulbactam (UNASYN) 3 g in sodium chloride 0.9 % 100 mL IVPB 3 g 200 mL/hr   10/27/21 0241 New Bag/Given   Ampicillin-Sulbactam (UNASYN) 3 g in sodium chloride 0.9 % 100 mL IVPB 3 g 200 mL/hr       Antimicrobials this admission:  unasyn 10/19/2021  >>   Microbiology results: 10/19/2021 Resp Cx: few gram + cocci  Thank you for allowing pharmacy to be a part of this patient's care.  Luan Pulling, PharmD Clinical Pharmacist

## 2021-10-27 NOTE — Progress Notes (Signed)
NAME:  Larry [name], MRN:  [id number], DOB:  [birth year], LOS: 14 ADMISSION DATE:  10/13/2021, CONSULTATION DATE:  10/27/2021  REFERRING MD:  Marisa Severin TRH , CHIEF COMPLAINT: Respiratory failure  History of Present Illness:  71 year old EtOH user, smoker BIBEMS on 6/22 for generalized weakness and multiple falls over the past several months.  He had pain in his mid to lower back .  His home was in a terrible condition, there were bedbugs.  He was severely malnourished initial imaging showed displaced rib fracture posterior left 10th, 11th and 12th ribs and left T2 transverse process fracture. Initial labs were significant for hyponatremia. Social work and APS referrals were made On 6/28 he was noted to be confused, disoriented with tremors consistent with DTs.  CIWA protocol was started and he was transferred to stepdown.  He was noted to be febrile, coughing and dyspnea consistent with aspiration.  He required intubation and mechanical ventilation, hypotensive requiring fluid bolus and Levophed drip  Pertinent  Medical History  EtOH abuse Hypertension Pneumoconiosis -coalminer's disease  Significant Hospital Events: Including procedures, antibiotic start and stop dates in addition to other pertinent events   6/29 ETT >> out 7/6 am  6/29 ET culture : few wbc's, nl flora    Scheduled Meds:  Chlorhexidine Gluconate Cloth  6 each Topical Daily   cholecalciferol  1,000 Units Per Tube Daily   clonazePAM  1 mg Per Tube BID   docusate  200 mg Per Tube Daily   enoxaparin (LOVENOX) injection  40 mg Subcutaneous Q24H   feeding supplement (PROSource TF)  45 mL Per Tube TID   free water  200 mL Per Tube Q4H   nicotine  21 mg Transdermal Daily   mouth rinse  15 mL Mouth Rinse Q2H   pantoprazole sodium  40 mg Per Tube Daily   Continuous Infusions:  sodium chloride Stopped (10/22/21 0802)   ampicillin-sulbactam (UNASYN) IV 3 g (10/27/21 0903)   dexmedetomidine (PRECEDEX) IV infusion Stopped  (10/27/21 0643)   feeding supplement (OSMOLITE 1.2 CAL) Stopped (10/27/21 1030)   PRN Meds:.acetaminophen, albuterol, fentaNYL (SUBLIMAZE) injection, ibuprofen, ondansetron **OR** ondansetron (ZOFRAN) IV, mouth rinse, oxyCODONE     Interim History / Subjective:  Extubated this am and placed in chair on nasal 02, good cough effort but poor mechanics   Objective   Blood pressure (!) 164/76, pulse 96, temperature (!) 97.1 F (36.2 C), temperature source Axillary, resp. rate (!) 25, height 6\' 2"  (1.88 m), weight 70 kg, SpO2 99 %.    Vent Mode: CPAP;PSV FiO2 (%):  [30 %-40 %] 30 % Set Rate:  [18 bmp] 18 bmp Vt Set:  [640 mL] 640 mL PEEP:  [5 cmH20] 5 cmH20 Pressure Support:  [5 cmH20] 5 cmH20 Plateau Pressure:  [10 cmH20-16 cmH20] 10 cmH20   Intake/Output Summary (Last 24 hours) at 10/27/2021 1144 Last data filed at 10/27/2021 1100 Gross per 24 hour  Intake 1876.46 ml  Output 2750 ml  Net -873.54 ml   Filed Weights   10/25/21 0621 10/26/21 0458 10/27/21 0500  Weight: 69.1 kg 69.5 kg 70 kg    Examination:  Tmax:  99.1 General appearance:    chronically ill up in chair s increased wob   At Rest 02 sats  99% on 3lpm   No jvd Oropharynx clear,  mucosa nl Neck supple Lungs with a few scattered exp > insp rhonchi bilaterally RRR no s3 or or sign murmur Abd soft / limied  excursion  Extr warm  with no edema or clubbing noted Neuro  Sensorium follows simple commands,  no apparent motor deficits    Resolved Hospital Problem list   Hyponatremia  Assessment & Plan:  Acute hypoxic respiratory failure Aspiration pneumonia -  likely significant underlying copd  >>>Continue duoneb   >>>Continue Unasyn per [facility] no pathologic org on ET culture from 6/29       Left-sided rib fractures, vertebral fracture  acute pain -Consider nonnarcotics eg clonidine for hbp/sedation  and nsaids  Acute metabolic encephalopathy Delirium tremens  >>> added clonazepam 1 mg bid 7/4 to help with  weaning (see above) if tolerates     Severe protein cal manutrition present on admit - note albumin very low  >>> tf per [facility]/nutrition    Best Practice (right click and "Reselect all SmartList Selections" daily)    Per [facility]   Labs   CBC: Recent Labs  Lab 10/21/21 0406 10/22/21 0536 10/24/21 0419 10/26/21 0411  WBC 6.1 4.8 6.6 4.9  HGB 8.5* 8.6* 8.8* 8.6*  HCT 26.3* 26.3* 26.9* 27.5*  MCV 104.8* 103.5* 103.9* 105.0*  PLT 296 302 305 239    Basic Metabolic Panel: Recent Labs  Lab 10/21/21 0406 10/22/21 0536 10/23/21 0445 10/24/21 0419 10/26/21 0411  NA 140 139 138 136 137  K 3.7 3.7 4.1 3.7 3.8  CL 111 109 107 107 103  CO2 23 25 25 24 27   GLUCOSE 127* 144* 122* 150* 120*  BUN 16 15 22 19 16   CREATININE 0.48* 0.51* 0.49* 0.50* 0.46*  CALCIUM 8.5* 8.5* 8.3* 8.2* 8.0*  MG 1.9  --   --   --   --   PHOS 3.3  --   --   --   --    GFR: Estimated Creatinine Clearance: 85.1 mL/min (A) (by C-G formula based on SCr of 0.46 mg/dL (L)). Recent Labs  Lab 10/21/21 0406 10/22/21 0536 10/24/21 0419 10/26/21 0411  WBC 6.1 4.8 6.6 4.9    Liver Function Tests: Recent Labs  Lab 10/21/21 0406 10/22/21 0536 10/23/21 0445  AST  --  82* 49*  ALT  --  73* 60*  ALKPHOS  --  172* 182*  BILITOT  --  0.5 0.6  PROT  --  5.4* 5.1*  ALBUMIN 2.1* 2.0* 2.0*   No results for input(s): "LIPASE", "AMYLASE" in the last 168 hours. No results for input(s): "AMMONIA" in the last 168 hours.  ABG    Component Value Date/Time   PHART 7.44 10/20/2021 0520   PCO2ART 44 10/20/2021 0520   PO2ART 90 10/20/2021 0520   HCO3 29.9 (H) 10/20/2021 0520   TCO2 23 02/28/2018 1643   O2SAT 100 10/20/2021 0520     Coagulation Profile: Recent Labs  Lab 10/23/21 0445  INR 1.1    Cardiac Enzymes: No results for input(s): "CKTOTAL", "CKMB", "CKMBINDEX", "TROPONINI" in the last 168 hours.  HbA1C: No results found for: "HGBA1C"  CBG: Recent Labs  Lab 10/26/21 1118 10/26/21 1605  10/26/21 2023 10/27/21 0423 10/27/21 0742  GLUCAP 111* 112* 117* 138* 106*         12/28/21, MD Pulmonary and Critical Care Medicine [facility] Cell [phone]   After 7:00 pm call Elink  [phone]

## 2021-10-27 NOTE — Progress Notes (Signed)
Palliative: [name], [name], is sitting up in the [name] in his room.  He was extubated this morning.  He is alert, will make an somewhat keep eye contact.  He appears acutely/chronically ill and quite frail.  He is having a difficult time managing his secretions.  He is oriented to person and situation, but not time.  This is similar to prior to transfer to the intensive care and intubation.  I am not sure that he can make his basic needs known.  There is no family present at bedside at this time.  I asked Larry [name] if he remembers being on the ventilator.  He shakes his head in the negative.  I ask if he were to have trouble breathing again would he want to be put back on the ventilator.  He nods his head affirmatively.  Call to estranged wife, Larry [name].  Left voicemail message.  Conference with attending, bedside nursing staff, transition of care team related to patient condition, needs, goals of care, disposition.  Plan:   At this point continue full scope/full code.  Time for outcomes.  Transition of care team is working with DSS for guardianship.  PMT to continue to follow.  35 minutes  [name], NP Palliative medicine team Team phone [phone] Greater than 50% of this time was spent counseling and coordinating care related to the above assessment and plan.

## 2021-10-27 NOTE — Evaluation (Signed)
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  He admits to drinking one half of 1/5 of whiskey daily.  He is mostly complaining of pain to his mid and lower back.  His home was noted to be in terrible condition and he was covered and bedbugs and all of his clothing had to be removed and he had to be bathed in the ED.  He was sent for work-up in the ED including CT scans and x-rays and noted to have displaced rib fractures in the posterior left 11th and 12th ribs and mildly displaced left 10th rib fracture and severe hepatic steatosis and left L2 transverse process acute fracture.  He is notably in severe pain and noted to be hyponatremic as well.  Admission was requested for pain management and possible placement.  He has severe problems with frequent falling likely related to alcohol intoxication   Clinical Impression  Patient demonstrates slow labored movement for rolling to side and sitting up from side lying position with bed flat and using bed rail requiring Mod/max assist, once seated initially falls backward, but able to keep trunk in midline with fair sitting balance after 2-3 minutes, unable to come to complete standing using RW due to buckling of knees and BLE weakness.  Patient  required Max assist to reposition when put back to bed, leaking from RUE IV noted - RN notified.  Patient will benefit from continued skilled physical therapy in hospital and recommended venue below to increase strength, balance, endurance  for safe ADLs and gait.        Recommendations for follow up therapy are one component of a multi-disciplinary discharge planning process, led by the attending physician.  Recommendations may be updated based on patient status, additional functional criteria and insurance authorization.  Follow Up Recommendations Skilled nursing-short term rehab (<3 hours/day) Can patient physically be transported by private vehicle: No    Assistance Recommended at Discharge Intermittent Supervision/Assistance  Patient can return home with the following  A lot of help with bathing/dressing/bathroom;A lot of help with walking and/or transfers;Help with stairs or ramp for entrance;Assistance with cooking/housework    Equipment Recommendations None recommended by PT  Recommendations for Other Services       Functional Status Assessment       Precautions / Restrictions Precautions Precautions: Fall Restrictions Weight Bearing Restrictions: No      Mobility  Bed Mobility Overal bed mobility: Needs Assistance Bed Mobility: Rolling, Sidelying to Sit, Sit to Sidelying Rolling: Mod assist Sidelying to sit: Mod assist, Max assist     Sit to sidelying: Max assist General bed mobility comments: good return for holding onto bed rail during bed mobility, unable to lift legs onto bed during sit to side lying due to weakness    Transfers Overall transfer level: Needs assistance Equipment used: Rolling walker (2 wheels) Transfers: Sit to/from Stand Sit to Stand: Max assist           General transfer comment: unable to full lock knees during sit to stands due to BLE weakness    Ambulation/Gait  Stairs            Wheelchair Mobility    Modified Rankin (Stroke Patients Only)       Balance Overall balance assessment: Needs assistance Sitting-balance support: Feet supported, No upper extremity supported Sitting balance-Leahy Scale: Fair Sitting balance -  Comments: seated at EOB   Standing balance support: During functional activity, Bilateral upper extremity supported, Reliant on assistive device for balance Standing balance-Leahy Scale: Poor Standing balance comment: using RW                             Pertinent Vitals/Pain Pain Assessment Pain Assessment: No/denies pain    Home Living Family/patient expects to be discharged to:: Private residence Living Arrangements: Alone Available Help at Discharge: Friend(s);Available PRN/intermittently Type of Home: Mobile home Home Access: Stairs to enter Entrance Stairs-Rails: Can reach both;Right;Left Entrance Stairs-Number of Steps: 4   Home Layout: One level Home Equipment: Conservation officer, nature (2 wheels)      Prior Function Prior Level of Function : Needs assist       Physical Assist : ADLs (physical);Mobility (physical) Mobility (physical): Bed mobility;Transfers;Gait;Stairs   Mobility Comments: Patient reports limited ambulation at home with leaning on furniture for ambulating throughout household. 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 ADLs Comments: Patient reports needing assist for ADL's that is provided by friend prn.     Hand Dominance   Dominant Hand: Right    Extremity/Trunk Assessment   Upper Extremity Assessment Upper Extremity Assessment: Generalized weakness    Lower Extremity Assessment Lower Extremity Assessment: Generalized weakness    Cervical / Trunk Assessment Cervical / Trunk Assessment: Normal  Communication   Communication: No difficulties  Cognition Arousal/Alertness: Awake/alert Behavior During Therapy: WFL for tasks assessed/performed Overall Cognitive Status: Within Functional Limits for tasks assessed                                 General Comments: apprehensive secondary to c/o fatigue, dizziness        General Comments      Exercises     Assessment/Plan    PT Assessment Patient needs  continued PT services;All further PT needs can be met in the next venue of care  PT Problem List Decreased strength;Decreased activity tolerance;Decreased balance;Decreased mobility;Decreased coordination       PT Treatment Interventions DME instruction;Gait training;Functional mobility training;Therapeutic activities;Therapeutic exercise;Balance training;Stair training;Patient/family education    PT Goals (Current goals can be found in the Care Plan section)  Acute Rehab PT Goals Patient Stated Goal: return home Time For Goal Achievement: 11/10/21 Potential to Achieve Goals: Good    Frequency Min 3X/week     Co-evaluation               AM-PAC PT "6 Clicks" Mobility  Outcome Measure Help needed turning from your back to your side while in a flat bed without using bedrails?: A Lot Help needed moving from lying on your back to sitting on the side of a flat bed without using bedrails?: A Lot Help needed moving to and from a bed to a chair (including a wheelchair)?: Total Help needed standing up from a chair using your arms (e.g., wheelchair or bedside chair)?: A Lot Help needed to walk in hospital room?: Total Help needed climbing 3-5 steps with a railing? : Total 6 Click Score: 9    End of Session  Activity Tolerance: Patient tolerated treatment well;Patient limited by fatigue Patient left: in bed;with call bell/phone within reach Nurse Communication: Mobility status PT Visit Diagnosis: Unsteadiness on feet (R26.81);Other abnormalities of gait and mobility (R26.89);Muscle weakness (generalized) (M62.81);History of falling (Z91.81)    Time: 1340-1402 PT Time Calculation (min) (ACUTE ONLY): 22 min   Charges:   PT Evaluation $PT Eval Moderate Complexity: 1 Mod PT Treatments $Therapeutic Activity: 8-22 mins        3:22 PM, 10/27/21 Lonell Grandchild, MPT Physical Therapist with [facility] 336 [phone] office [phone] mobile phone

## 2021-10-28 DIAGNOSIS — R52 Pain, unspecified: Secondary | ICD-10-CM | POA: Diagnosis not present

## 2021-10-28 DIAGNOSIS — S2243XA Multiple fractures of ribs, bilateral, initial encounter for closed fracture: Secondary | ICD-10-CM | POA: Diagnosis not present

## 2021-10-28 DIAGNOSIS — T17908D Unspecified foreign body in respiratory tract, part unspecified causing other injury, subsequent encounter: Secondary | ICD-10-CM | POA: Diagnosis not present

## 2021-10-28 DIAGNOSIS — F1010 Alcohol abuse, uncomplicated: Secondary | ICD-10-CM | POA: Diagnosis not present

## 2021-10-28 DIAGNOSIS — J9601 Acute respiratory failure with hypoxia: Secondary | ICD-10-CM | POA: Diagnosis not present

## 2021-10-28 DIAGNOSIS — J9602 Acute respiratory failure with hypercapnia: Secondary | ICD-10-CM | POA: Diagnosis not present

## 2021-10-28 LAB — GLUCOSE, CAPILLARY
Glucose-Capillary: 115 mg/dL — ABNORMAL HIGH (ref 70–99)
Glucose-Capillary: 122 mg/dL — ABNORMAL HIGH (ref 70–99)
Glucose-Capillary: 65 mg/dL — ABNORMAL LOW (ref 70–99)
Glucose-Capillary: 76 mg/dL (ref 70–99)
Glucose-Capillary: 81 mg/dL (ref 70–99)
Glucose-Capillary: 93 mg/dL (ref 70–99)
Glucose-Capillary: 93 mg/dL (ref 70–99)

## 2021-10-28 MED ORDER — DEXTROSE 50 % IV SOLN
12.5000 g | INTRAVENOUS | Status: AC
  Administered 2021-10-28: 12.5 g via INTRAVENOUS

## 2021-10-28 MED ORDER — DEXTROSE 50 % IV SOLN
50.0000 mL | Freq: Once | INTRAVENOUS | Status: AC
  Administered 2021-10-28: 25 mL via INTRAVENOUS
  Filled 2021-10-28: qty 50

## 2021-10-28 MED ORDER — VITAMIN D 25 MCG (1000 UNIT) PO TABS
1000.0000 [IU] | ORAL_TABLET | Freq: Every day | ORAL | Status: DC
Start: 2021-10-28 — End: 2021-11-02
  Administered 2021-10-28 – 2021-11-02 (×6): 1000 [IU] via ORAL
  Filled 2021-10-28 (×6): qty 1

## 2021-10-28 MED ORDER — ORAL CARE MOUTH RINSE: 15.0000 mL | OROMUCOSAL | Status: DC | PRN

## 2021-10-28 MED ORDER — DEXTROSE 50 % IV SOLN
INTRAVENOUS | Status: AC
  Filled 2021-10-28: qty 50

## 2021-10-28 NOTE — Progress Notes (Signed)
Speech Language Pathology Treatment: Dysphagia  Patient Details Name: Larry [name] MRN: [id number] DOB: May 23, 1950 Today's Date: 10/28/2021 Time: 1497-0263 SLP Time Calculation (min) (ACUTE ONLY): 33 min  Assessment / Plan / Recommendation Clinical Impression  Ongoing diagnostic dysphagia therapy provided today; Pt was alert and responsive and answered my questions with noted decreased vocal quality. Pt demonstrated congested wet cough at baseline prior to trials. Note immediate coughing with ice chips and thin trials; delayed congested coughing was noted with NTL and no overt s/sx of oropharyngeal dysphagia noted with HTL. Pt also consumed puree textures without incident. Pt reports he normally has his dentures and that he cannot masticate regular trials without them in. Recommend initiate D1/puree diet with HTL. Recommend continue meds whole in puree. ST will continue to follow for subjective upgrade or possible need for instrumental assessment. Thank you,   HPI HPI: 71 year old EtOH user, smoker BIBEMS on 6/22 for generalized weakness and multiple falls over the past several months.  He had pain in his mid to lower back .  His home was in a terrible condition, there were bedbugs.  He was severely malnourished initial imaging showed displaced rib fracture posterior left 10th, 11th and 12th ribs and left T2 transverse process fracture.  Initial labs were significant for hyponatremia.  Social work and APS referrals were made  On 6/28 he was noted to be confused, disoriented with tremors consistent with DTs.  CIWA protocol was started and he was transferred to stepdown.  He was noted to be febrile, coughing and dyspnea consistent with aspiration.  He required intubation and mechanical ventilation, hypotensive requiring fluid bolus and Levophed drip. Pt extubated this AM and BSE requested.         Recommendations for follow up therapy are one component of a multi-disciplinary discharge planning process,  led by the attending physician.  Recommendations may be updated based on patient status, additional functional criteria and insurance authorization.    Recommendations  Diet recommendations: Dysphagia 1 (puree);Honey-thick liquid Liquids provided via: Cup Medication Administration: Whole meds with puree Supervision: Patient able to self feed;Staff to assist with self feeding;Intermittent supervision to cue for compensatory strategies Compensations: Slow rate;Minimize environmental distractions;Small sips/bites                Oral Care Recommendations: Staff/trained caregiver to provide oral care;Oral care BID Assistance recommended at discharge: Frequent or constant Supervision/Assistance SLP Visit Diagnosis: Dysphagia, unspecified (R13.10)          Meriah Shands H. Romie Levee, CCC-SLP Speech Language Pathologist  Georgetta Haber  10/28/2021, 3:12 PM

## 2021-10-28 NOTE — TOC Progression Note (Signed)
Transition of Care [facility]) - Progression Note    Patient Details  Name: DRAVYN SEVERS MRN: [id number] Date of Birth: 03-08-1951  Transition of Care [facility]) CM/SW Contact  Annice Needy, LCSW Phone Number: 10/28/2021, 11:10 AM  Clinical Narrative:    TOC spoke with APS social worker, Barnet Pall, [phone], [phone]. Discussed that patient's authorization for SNF will be restarted and that it is planned for patient to go to rehab at [facility] Discussed difficulty contacting wife due to calls not being returned. Advised that patient may need a guardian.  Authorization started for SNF again.  Debbie with [facility] advised that authorization was started. [name] confirms that patient still has a bed offer at the facility.    Expected Discharge Plan: Skilled Nursing Facility Barriers to Discharge: Continued Medical Work up  Expected Discharge Plan and Services Expected Discharge Plan: Skilled Nursing Facility     Post Acute Care Choice: Skilled Nursing Facility Living arrangements for the past 2 months: Single Family Home                                       Social Determinants of Health (SDOH) Interventions    Readmission Risk Interventions    03/10/2021    2:14 PM 02/11/2021    1:11 PM  Readmission Risk Prevention Plan  Medication Screening Complete   Transportation Screening Complete Complete  Home Care Screening  Complete  Medication Review (RN CM)  Complete

## 2021-10-28 NOTE — Progress Notes (Signed)
NAME:  Larry [name], MRN:  [id number], DOB:  11-Aug-1950, LOS: 15 ADMISSION DATE:  10/13/2021, CONSULTATION DATE:  10/28/2021  REFERRING MD:  Marisa Severin TRH , CHIEF COMPLAINT: Respiratory failure  History of Present Illness:  71 year old EtOH user, smoker BIBEMS on 6/22 for generalized weakness and multiple falls over the past several months.  He had pain in his mid to lower back .  His home was in a terrible condition, there were bedbugs.  He was severely malnourished initial imaging showed displaced rib fracture posterior left 10th, 11th and 12th ribs and left T2 transverse process fracture. Initial labs were significant for hyponatremia. Social work and APS referrals were made On 6/28 he was noted to be confused, disoriented with tremors consistent with DTs.  CIWA protocol was started and he was transferred to stepdown.  He was noted to be febrile, coughing and dyspnea consistent with aspiration.  He required intubation and mechanical ventilation, hypotensive requiring fluid bolus and Levophed drip  Pertinent  Medical History  EtOH abuse Hypertension Pneumoconiosis -coal miner's disease  Significant Hospital Events: Including procedures, antibiotic start and stop dates in addition to other pertinent events   6/29 ETT >> out 7/6 am  6/29 ET culture : few wbc's, nl flora    Scheduled Meds:  Chlorhexidine Gluconate Cloth  6 each Topical Daily   cholecalciferol  1,000 Units Oral Daily   clonazePAM  1 mg Oral BID   cloNIDine  0.1 mg Oral TID   enoxaparin (LOVENOX) injection  40 mg Subcutaneous Q24H   folic acid  1 mg Oral Daily   ipratropium-albuterol  3 mL Nebulization QID   multivitamin with minerals  1 tablet Oral Daily   nicotine  21 mg Transdermal Daily   pantoprazole sodium  40 mg Oral Daily   thiamine  100 mg Oral Daily   Or   thiamine  100 mg Intravenous Daily   vitamin B-12  1,000 mcg Oral Daily   Continuous Infusions:  sodium chloride Stopped (10/22/21 0802)    ampicillin-sulbactam (UNASYN) IV 3 g (10/28/21 1352)   PRN Meds:.acetaminophen, albuterol, ibuprofen, LORazepam, ondansetron **OR** ondansetron (ZOFRAN) IV, mouth rinse, mouth rinse, oxyCODONE     Interim History / Subjective:  Did fine overnight s bipap but still with only marginal cough mechanics/ difficult understanding speech   Objective   Blood pressure (!) 141/70, pulse 95, temperature 99 F (37.2 C), temperature source Axillary, resp. rate 16, height 6\' 2"  (1.88 m), weight 67.8 kg, SpO2 98 %.        Intake/Output Summary (Last 24 hours) at 10/28/2021 1512 Last data filed at 10/28/2021 1428 Gross per 24 hour  Intake 208.5 ml  Output 2065 ml  Net -1856.5 ml   Filed Weights   10/26/21 0458 10/27/21 0500 10/28/21 0500  Weight: 69.5 kg 70 kg 67.8 kg    Examination:  Tmax:  99 General appearance:    more chronically than acutely ill appearing   At Rest 02 sats  99% on 2.5 lpm   No jvd Oropharynx clear,  mucosa nl Neck supple Lungs with very distant bs  bilaterally/ rattling cough on voluntary maneuver  RRR no s3 or or sign murmur Abd soft withnl excursion  Extr warm with no edema or clubbing noted Neuro  Sensorium appears alert but hard to understand speech ,  no apparent motor deficits    Resolved Hospital Problem list   Hyponatremia  Assessment & Plan:  Acute hypoxic respiratory failure Aspiration pneumonia -  likely  significant underlying copd  >>>Continue duoneb   >>>rx Unasyn per [facility] no pathologic org on ET culture from 6/29       Left-sided rib fractures, vertebral fracture  acute pain -Consider nonnarcotics : added clonidine 7/6  for bp which should help with pain needes too   Acute metabolic encephalopathy Delirium tremens  >>> added clonazepam 1 mg bid 7/4 to help with weaning (see above) if tolerates     Severe protein cal manutrition present on admit - note albumin very low  >>> tf per [facility]/nutrition    Best Practice (right click and  "Reselect all SmartList Selections" daily)    Per [facility]   Labs   CBC: Recent Labs  Lab 10/22/21 0536 10/24/21 0419 10/26/21 0411  WBC 4.8 6.6 4.9  HGB 8.6* 8.8* 8.6*  HCT 26.3* 26.9* 27.5*  MCV 103.5* 103.9* 105.0*  PLT 302 305 239    Basic Metabolic Panel: Recent Labs  Lab 10/22/21 0536 10/23/21 0445 10/24/21 0419 10/26/21 0411  NA 139 138 136 137  K 3.7 4.1 3.7 3.8  CL 109 107 107 103  CO2 25 25 24 27   GLUCOSE 144* 122* 150* 120*  BUN 15 22 19 16   CREATININE 0.51* 0.49* 0.50* 0.46*  CALCIUM 8.5* 8.3* 8.2* 8.0*   GFR: Estimated Creatinine Clearance: 82.4 mL/min (A) (by C-G formula based on SCr of 0.46 mg/dL (L)). Recent Labs  Lab 10/22/21 0536 10/24/21 0419 10/26/21 0411  WBC 4.8 6.6 4.9    Liver Function Tests: Recent Labs  Lab 10/22/21 0536 10/23/21 0445  AST 82* 49*  ALT 73* 60*  ALKPHOS 172* 182*  BILITOT 0.5 0.6  PROT 5.4* 5.1*  ALBUMIN 2.0* 2.0*   No results for input(s): "LIPASE", "AMYLASE" in the last 168 hours. No results for input(s): "AMMONIA" in the last 168 hours.  ABG    Component Value Date/Time   PHART 7.44 10/20/2021 0520   PCO2ART 44 10/20/2021 0520   PO2ART 90 10/20/2021 0520   HCO3 29.9 (H) 10/20/2021 0520   TCO2 23 02/28/2018 1643   O2SAT 100 10/20/2021 0520     Coagulation Profile: Recent Labs  Lab 10/23/21 0445  INR 1.1    Cardiac Enzymes: No results for input(s): "CKTOTAL", "CKMB", "CKMBINDEX", "TROPONINI" in the last 168 hours.  HbA1C: No results found for: "HGBA1C"  CBG: Recent Labs  Lab 10/28/21 0031 10/28/21 0405 10/28/21 0732 10/28/21 0841 10/28/21 1155  GLUCAP 81 76 65* 115* 122*        PCCM f/u is prn   12/29/21, MD Pulmonary and Critical Care Medicine  Healthcare Cell [phone]   After 7:00 pm call Elink  [phone]

## 2021-10-28 NOTE — Progress Notes (Signed)
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  He admits to drinking one half of 1/5 of whiskey daily.  He is mostly complaining of pain to his mid and lower back.  His home was noted to be in terrible condition and he was covered and bedbugs and all of his clothing had to be removed and he had to be bathed in the ED.  He was sent for work-up in the ED including CT scans and x-rays and noted to have displaced rib fractures in the posterior left 11th and 12th ribs and mildly displaced left 10th rib fracture and severe hepatic steatosis and left L2 transverse process acute fracture.  He is notably in severe pain and noted to be hyponatremic as well.  Admission was requested for pain management and possible placement.  He has severe problems with frequent falling likely related to alcohol intoxication.   Assessment and Plan: 1) acute hypoxic respiratory failure--- in the setting of aspiration pneumonia and full-blown delirium tremens -Intubated 10/19/2021 -Pulmonary consult appreciated -Continue as needed clonazepam -Successfully extubated on 10/27/2021 -Continue to wean of oxygen supplementation as tolerated. -Patient will be assessed for another 24 hours in the stepdown and if he remains in stable for transfer to telemetry floor.   2)multiple rib fractures/Lumbar transverse process fracture/Falls - TLSO brace to assist with severe rib fracture pain when he comes off  vent -Continue as needed analgesics.  3)Aspiration pneumonia with acute hypoxic respiratory failure -In the setting of alcohol abuse/delirium tremens  -Continue IV Unasyn (started on 10/19/2021) -c/n  bronchodilators, -Currently intubated/ventilated -Planning for a total of 8 days of antibiotics; discussed with pharmacy for end date placement. -Has remained afebrile.  4)Delirium tremens-- -Continue as needed clonazepam -Mentation back to baseline; no active withdrawal seen.  5)Hypomagnesemia/hyponatremia -- Continue to maintain adequate hydration and further replete electrolytes as needed. -Continue to follow electrolytes trend and further replete as needed.  6)Macrocytic anemia - serum B12  Is 973 -Continue repletion of folic acid -Macrocytosis appears to be in the setting of alcohol abuse/consumption.  7)Bedbug infestation - Reportedly noted to be living in substandard horrible conditions according to [facility] EMS report - He has been thoroughly cleansed and clothing changed out in the emergency department - [facility] consultation with social work for APS referral.  8)Severe hepatic steatosis/alcoholic hepatitis - Secondary to chronic alcohol abuse - He should follow-up with GI on outpatient basis for ongoing surveillance.  9)Failure to thrive in adult/severe protein caloric malnutrition - Patient is severely deconditioned and malnourished and living in substandard conditions and we have asked for a physical therapy consultation and TOC social work consultation for possible APS referral and placement in a safe for home environment. -Dietitian consult appreciated; will follow recommendations. -Continue feeding supplements and diet initiation.  Will follow response and Ability for safe swallowing.  10)Pneumoconiosis, coal, workers' [facility]) - smoker -Continue the use of bronchodilators as needed.  11)HTN--- had hypotension post-intubation -required Levophed for pressure support -Very  likely in the setting of sedation -Stable  blood pressure currently -Patient off pressors.  12)FEN- -Having multiple BMs -Rectal tube in place. -After seen by speech therapy plan is for dysphagia 1 with honey thick liquids. -Follow progress. -Continue feeding supplements.  13)Social/Ethics- -I have been unable to reach family/next of kin for decision-making purposes--DSS/APS notified. -TOC and palliative care helping to reach family.  After discussing with patient who is fully oriented at this point reports that he was living by himself and no other family members or next of kin available for contacts. -Continue to follow progress. -Plan is for discharge to skilled nursing facility for rehabilitation and conditioning.  14)Falls frequently - This is most likely secondary to chronic alcohol intoxication in addition to substandard living conditions - PT evaluation recommending SNF rehab  - Fall precautions recommended --working on SNF rehab placement; physical therapy has reevaluated patient with recommendations for rehab at skilled nursing facility.  DVT prophylaxis: enoxaparin Code Status: full code Family Communication: none present  Disposition: Extubated, physically weak/deconditioned.  Advancing diet as per speech therapy recommendations and completed antibiotic treatment.  Insurance authorization requested for discharge to a skilled nursing facility for further care and rehab.  Status is: Inpatient  Remains inpatient appropriate because: IV fluids, electrolyte replacement , intubated and sedated   Consultants: palliative care, PT /pulmonary critical care  Procedures:  10/19/2021 intubation Antimicrobials:  Unasyn started on 10/19/2021 (planning for a total of 8 days).  Subjective: Successfully extubated on 10/27/2021; following commands appropriately.  No fever, no nausea or vomiting.  2.5 L supplementation in place with good oxygen saturation.  Intermittently with activity  tachypnea appreciated.  Still having wet coughing spells and bilateral bronchitis/expiratory wheezing on auscultation.  Severely weak and deconditioned.  Expressing back and rib cage pain.  Objective: Vitals:   10/28/21 1215 10/28/21 1218 10/28/21 1245 10/28/21 1604  BP:   (!) 141/70   Pulse:   95   Resp:   16   Temp:  99 F (37.2 C)  98.2 F (36.8 C)  TempSrc:  Axillary  Oral  SpO2: 96%  98%   Weight:      Height:        Intake/Output Summary (Last 24 hours) at 10/28/2021 1712 Last data filed at 10/28/2021 1428 Gross per 24 hour  Intake 120 ml  Output 2065 ml  Net -1945 ml   Filed Weights   10/26/21 0458 10/27/21 0500 10/28/21 0500  Weight: 69.5 kg 70 kg 67.8 kg   Physical Exam General exam: Alert, awake, oriented x 3; following commands and afebrile.  Successfully extubated on 10/27/2021.  Weak and deconditioned. Respiratory system: Positive rhonchi bilaterally; mild expiratory wheezing appreciated on exam.  Improved from movement. Cardiovascular system:RRR.  No rubs or gallops; no JVD. Gastrointestinal system: Abdomen is nondistended, soft and nontender. No organomegaly or masses felt. Normal bowel sounds heard. Central nervous system: Alert and oriented. No focal neurological deficits. Extremities: No cyanosis or clubbing.  No edema. Skin: No petechiae.  Still having some loose stools. Psychiatry: Judgement and insight appear normal. Mood & affect appropriate.   Data Reviewed: I have personally reviewed following labs and imaging studies  CBC: Recent Labs  Lab 10/22/21 0536 10/24/21 0419 10/26/21 0411  WBC 4.8 6.6 4.9  HGB 8.6* 8.8* 8.6*  HCT 26.3* 26.9* 27.5*  MCV 103.5* 103.9* 105.0*  PLT 302 305 239    Basic Metabolic Panel: Recent Labs  Lab 10/22/21 0536 10/23/21 0445 10/24/21 0419 10/26/21 0411  NA 139 138 136 137  K 3.7 4.1 3.7  3.8  CL 109 107 107 103  CO2 25 25 24 27   GLUCOSE 144* 122* 150* 120*  BUN 15 22 19 16   CREATININE 0.51* 0.49* 0.50*  0.46*  CALCIUM 8.5* 8.3* 8.2* 8.0*    CBG: Recent Labs  Lab 10/28/21 0405 10/28/21 0732 10/28/21 0841 10/28/21 1155 10/28/21 1554  GLUCAP 76 65* 115* 122* 93    Recent Results (from the past 240 hour(s))  Culture, Respiratory w Gram Stain     Status: None   Collection Time: 10/20/21  3:00 PM   Specimen: Tracheal Aspirate; Respiratory  Result Value Ref Range Status   Specimen Description   Final    TRACHEAL ASPIRATE Performed at [facility], [address] [place], [address] [place]    Special Requests   Final    NONE Performed at [facility], [address], [place], [address] [place]    Gram Stain   Final    NO SQUAMOUS EPITHELIAL CELLS SEEN FEW WBC SEEN FEW GRAM POSITIVE COCCI    Culture   Final    MODERATE Normal respiratory flora-no Staph aureus or Pseudomonas seen Performed at [facility], [address] [place], [address] [place]    Report Status 10/23/2021 FINAL  Final     Radiology Studies: No results found.  Scheduled Meds:  Chlorhexidine Gluconate Cloth  6 each Topical Daily   cholecalciferol  1,000 Units Oral Daily   clonazePAM  1 mg Oral BID   cloNIDine  0.1 mg Oral TID   enoxaparin (LOVENOX) injection  40 mg Subcutaneous Q24H   folic acid  1 mg Oral Daily   ipratropium-albuterol  3 mL Nebulization QID   multivitamin with minerals  1 tablet Oral Daily   nicotine  21 mg Transdermal Daily   pantoprazole sodium  40 mg Oral Daily   thiamine  100 mg Oral Daily   Or   thiamine  100 mg Intravenous Daily   vitamin B-12  1,000 mcg Oral Daily   Continuous Infusions:  sodium chloride Stopped (10/22/21 0802)   ampicillin-sulbactam (UNASYN) IV 3 g (10/28/21 1352)    LOS: 15 days   12/23/21, MD How to contact the [facility] Attending or Consulting provider 7A - 7P or covering provider during after hours 7P -7A, for this patient?  Check the care team in [facility] and look for a) attending/consulting TRH provider listed and b) the [facility] team listed Log  into [email or web address] and use [facility] universal password to access. If you do not have the password, please contact the hospital operator. Locate the [facility] provider you are looking for under [facility] and page to a number that you can be directly reached. If you still have difficulty reaching the provider, please page the [facility] (Director on Call) for the Hospitalists listed on amion for assistance.  10/28/2021, 5:12 PM

## 2021-10-28 NOTE — NC FL2 (Addendum)
[place] MEDICAID FL2 LEVEL OF CARE SCREENING TOOL     IDENTIFICATION  Patient Name: Larry [name] Birthdate: Oct 08, 1950 Sex: male Admission Date (Current Location): 10/13/2021  [facility] and [place] Number:  [employer] and Address:  [facility],  [address], [place] [address]      Provider Number: [id number]  Attending Physician Name and Address:  Vassie Loll, MD  Relative Name and Phone Number:       Current Level of Care: Hospital Recommended Level of Care: Skilled Nursing Facility Prior Approval Number:    Date Approved/Denied:   PASRR Number: [id number]  Discharge Plan: SNF    Current Diagnoses: Patient Active Problem List   Diagnosis Date Noted   Acute respiratory failure with hypoxia and hypercapnia (HCC) 10/24/2021   Possible Aspiration into airway 10/18/2021   Somnolence 10/16/2021   Hypomagnesemia 10/14/2021   Uncontrolled pain 10/13/2021   Failure to thrive in adult 10/13/2021   Lumbar transverse process fracture 10/13/2021   Alcohol abuse 10/13/2021   Severe hepatic steatosis 10/13/2021   Bedbug infestation 10/13/2021   Multiple rib fractures 10/13/2021   Macrocytic anemia 10/13/2021   Hyponatremia 10/13/2021   Protein-calorie malnutrition, severe 03/10/2021   Alcohol intoxication (HCC) 03/09/2021   Generalized weakness 03/09/2021   Elevated troponin 03/09/2021   Physical deconditioning 03/09/2021   Elevated AST (SGOT) 03/09/2021   Thrombocytopenia (HCC) 03/09/2021   Elevated MCV 03/09/2021   High anion gap metabolic acidosis 03/09/2021   Dehydration 03/09/2021   Pneumoconiosis, coal, 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   Eye discharge 03/09/2021   Alcohol withdrawal (HCC) 02/06/2021   Falls frequently 02/06/2021   Essential hypertension 02/06/2021   Proximal humerus fracture 09/24/2014    Orientation RESPIRATION BLADDER Height & Weight     Self, Time, Situation, Place  Normal Continent Weight: 149 lb 7.6 oz (67.8  kg) Height:  6\' 2"  (188 cm)  BEHAVIORAL SYMPTOMS/MOOD NEUROLOGICAL BOWEL NUTRITION STATUS      Continent Diet (regular)  AMBULATORY STATUS COMMUNICATION OF NEEDS Skin   Limited Assist Verbally Normal                       Personal Care Assistance Level of Assistance  Bathing, Feeding, Dressing Bathing Assistance: Limited assistance Feeding assistance: Independent Dressing Assistance: Limited assistance     Functional Limitations Info  Sight, Hearing, Speech Sight Info: Adequate Hearing Info: Adequate Speech Info: Adequate    SPECIAL CARE FACTORS FREQUENCY  Speech therapy     PT Frequency: 5x/week       Speech Therapy Frequency: 3x/week      Contractures Contractures Info: Not present    Additional Factors Info  Code Status, Allergies Code Status Info: full code Allergies Info: NKA           Current Medications (10/28/2021):  This is the current hospital active medication list Current Facility-Administered Medications  Medication Dose Route Frequency Provider Last Rate Last Admin   0.9 %  sodium chloride infusion  250 mL Intravenous Continuous 12/29/2021, MD   Stopped at 10/22/21 0802   acetaminophen (TYLENOL) suppository 650 mg  650 mg Rectal Q4H PRN Emokpae, Courage, MD       albuterol (PROVENTIL) (2.5 MG/3ML) 0.083% nebulizer solution 2.5 mg  2.5 mg Nebulization Q4H PRN Emokpae, Courage, MD       Ampicillin-Sulbactam (UNASYN) 3 g in sodium chloride 0.9 % 100 mL IVPB  3 g Intravenous Q6H 12/23/21, MD 200 mL/hr at 10/28/21 [phone]  3 g at 10/28/21 0827   Chlorhexidine Gluconate Cloth 2 % PADS 6 each  6 each Topical Daily Shon Hale, MD   6 each at 10/28/21 1029   cholecalciferol (VITAMIN D3) tablet 1,000 Units  1,000 Units Oral Daily Vassie Loll, MD   1,000 Units at 10/28/21 1029   clonazePAM (KLONOPIN) tablet 1 mg  1 mg Oral BID Vassie Loll, MD   1 mg at 10/28/21 1029   cloNIDine (CATAPRES) tablet 0.1 mg  0.1 mg Oral TID Nyoka Cowden,  MD   0.1 mg at 10/28/21 1028   enoxaparin (LOVENOX) injection 40 mg  40 mg Subcutaneous Q24H Johnson, Clanford L, MD   40 mg at 10/27/21 1048   folic acid (FOLVITE) tablet 1 mg  1 mg Oral Daily Vassie Loll, MD   1 mg at 10/28/21 1029   ibuprofen (ADVIL) tablet 400 mg  400 mg Oral Q6H PRN Johnson, Clanford L, MD       ipratropium-albuterol (DUONEB) 0.5-2.5 (3) MG/3ML nebulizer solution 3 mL  3 mL Nebulization QID Vassie Loll, MD   3 mL at 10/28/21 0757   LORazepam (ATIVAN) injection 1-4 mg  1-4 mg Intravenous Q1H PRN Vassie Loll, MD       multivitamin with minerals tablet 1 tablet  1 tablet Oral Daily Vassie Loll, MD   1 tablet at 10/28/21 1028   nicotine (NICODERM CQ - dosed in mg/24 hours) patch 21 mg  21 mg Transdermal Daily Johnson, Clanford L, MD   21 mg at 10/28/21 1028   ondansetron (ZOFRAN) tablet 4 mg  4 mg Per Tube Q6H PRN Emokpae, Courage, MD       Or   ondansetron (ZOFRAN) injection 4 mg  4 mg Intravenous Q6H PRN Emokpae, Courage, MD       Oral care mouth rinse  15 mL Mouth Rinse PRN Emokpae, Courage, MD       Oral care mouth rinse  15 mL Mouth Rinse PRN Vassie Loll, MD       oxyCODONE (Oxy IR/ROXICODONE) immediate release tablet 5 mg  5 mg Oral Q6H PRN Vassie Loll, MD   5 mg at 10/28/21 1028   pantoprazole sodium (PROTONIX) 40 mg/20 mL oral suspension 40 mg  40 mg Oral Daily Vassie Loll, MD   40 mg at 10/28/21 1029   thiamine tablet 100 mg  100 mg Oral Daily Vassie Loll, MD   100 mg at 10/27/21 1740   Or   thiamine (B-1) injection 100 mg  100 mg Intravenous Daily Vassie Loll, MD   100 mg at 10/28/21 1028   vitamin B-12 (CYANOCOBALAMIN) tablet 1,000 mcg  1,000 mcg Oral Daily Vassie Loll, MD   1,000 mcg at 10/28/21 1028     Discharge Medications: Please see discharge summary for a list of discharge medications.  Relevant Imaging Results:  Relevant Lab Results:   Additional Information SS# [id number]  Annice Needy, LCSW

## 2021-10-28 NOTE — Progress Notes (Signed)
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  He admits to drinking one half of 1/5 of whiskey daily.  He is mostly complaining of pain to his mid and lower back.  His home was noted to be in terrible condition and he was covered and bedbugs and all of his clothing had to be removed and he had to be bathed in the ED.  He was sent for work-up in the ED including CT scans and x-rays and noted to have displaced rib fractures in the posterior left 11th and 12th ribs and mildly displaced left 10th rib fracture and severe hepatic steatosis and left L2 transverse process acute fracture.  He is notably in severe pain and noted to be hyponatremic as well.  Admission was requested for pain management and possible placement.  He has severe problems with frequent falling likely related to alcohol intoxication    PT Comments    Patient requires Mod assist and frequent verbal/tactile cueing for rolling to side and sitting up from side lying position, Max assist for donning/doffing TLSO brace, very unsteady on feet and unable to maintain standing balance using RW.  Patient required Max assist with left blocked to complete stand pivot transfer to chair.  Patient tolerated sitting up in chair after therapy - RN aware.  Patient will benefit from continued skilled physical therapy in hospital and recommended venue below to increase strength, balance, endurance for safe ADLs and gait.     Recommendations for follow up therapy are one component  of a multi-disciplinary discharge planning process, led by the attending physician.  Recommendations may be updated based on patient status, additional functional criteria and insurance authorization.  Follow Up Recommendations  Skilled nursing-short term rehab (<3 hours/day) Can patient physically be transported by private vehicle: No   Assistance Recommended at Discharge Intermittent Supervision/Assistance  Patient can return home with the following A lot of help with bathing/dressing/bathroom;A lot of help with walking and/or transfers;Help with stairs or ramp for entrance;Assistance with cooking/housework   Equipment Recommendations  None recommended by PT    Recommendations for Other Services       Precautions / Restrictions Precautions Precautions: Fall Restrictions Weight Bearing Restrictions: No     Mobility  Bed Mobility Overal bed mobility: Needs Assistance Bed Mobility: Rolling, Sidelying to Sit Rolling: Mod assist Sidelying to sit: Mod assist       General bed mobility comments: slow labored movement    Transfers Overall transfer level: Needs assistance Equipment used: Rolling walker (2 wheels), 1 person hand held assist Transfers: Sit to/from Stand, Bed to chair/wheelchair/BSC Sit to Stand: Max assist Stand pivot transfers: Max assist         General transfer comment: Patient unable to fully stand using RW, required stand pivot with left knee blocked to transfer to chair    Ambulation/Gait                   Stairs             Wheelchair Mobility    Modified Rankin (Stroke Patients Only)  Balance Overall balance assessment: Needs assistance Sitting-balance support: Feet supported, No upper extremity supported Sitting balance-Leahy Scale: Fair Sitting balance - Comments: seated at EOB   Standing balance support: During functional activity, Bilateral upper extremity supported, Reliant on assistive device for  balance Standing balance-Leahy Scale: Poor Standing balance comment: using RW                            Cognition Arousal/Alertness: Awake/alert Behavior During Therapy: WFL for tasks assessed/performed Overall Cognitive Status: Within Functional Limits for tasks assessed                                          Exercises General Exercises - Lower Extremity Long Arc Quad: Seated, AAROM, Strengthening, Both, 10 reps Hip Flexion/Marching: Seated, AAROM, Strengthening, Both, 10 reps    General Comments        Pertinent Vitals/Pain Pain Assessment Pain Assessment: Faces Faces Pain Scale: Hurts a little bit Pain Location: low back Pain Descriptors / Indicators: Discomfort, Sore, Grimacing Pain Intervention(s): Limited activity within patient's tolerance, Monitored during session, Repositioned    Home Living                          Prior Function            PT Goals (current goals can now be found in the care plan section) Acute Rehab PT Goals Patient Stated Goal: return home PT Goal Formulation: With patient Time For Goal Achievement: 11/10/21 Potential to Achieve Goals: Good Progress towards PT goals: Progressing toward goals    Frequency    Min 3X/week      PT Plan Current plan remains appropriate    Co-evaluation              AM-PAC PT "6 Clicks" Mobility   Outcome Measure  Help needed turning from your back to your side while in a flat bed without using bedrails?: A Lot Help needed moving from lying on your back to sitting on the side of a flat bed without using bedrails?: A Lot Help needed moving to and from a bed to a chair (including a wheelchair)?: A Lot Help needed standing up from a chair using your arms (e.g., wheelchair or bedside chair)?: A Lot Help needed to walk in hospital room?: Total Help needed climbing 3-5 steps with a railing? : Total 6 Click Score: 10    End of Session   Activity  Tolerance: Patient tolerated treatment well;Patient limited by fatigue Patient left: in chair;with call bell/phone within reach Nurse Communication: Mobility status PT Visit Diagnosis: Unsteadiness on feet (R26.81);Other abnormalities of gait and mobility (R26.89);Muscle weakness (generalized) (M62.81);History of falling (Z91.81)     Time: 0623-7628 PT Time Calculation (min) (ACUTE ONLY): 35 min  Charges:  $Therapeutic Exercise: 8-22 mins $Therapeutic Activity: 8-22 mins                     12:11 PM, 10/28/21 Ocie Bob, MPT Physical Therapist with [facility] [phone] office [phone] mobile phone

## 2021-10-29 DIAGNOSIS — J9601 Acute respiratory failure with hypoxia: Secondary | ICD-10-CM | POA: Diagnosis not present

## 2021-10-29 DIAGNOSIS — S2243XA Multiple fractures of ribs, bilateral, initial encounter for closed fracture: Secondary | ICD-10-CM | POA: Diagnosis not present

## 2021-10-29 DIAGNOSIS — F1010 Alcohol abuse, uncomplicated: Secondary | ICD-10-CM | POA: Diagnosis not present

## 2021-10-29 DIAGNOSIS — R52 Pain, unspecified: Secondary | ICD-10-CM | POA: Diagnosis not present

## 2021-10-29 LAB — GLUCOSE, CAPILLARY
Glucose-Capillary: 105 mg/dL — ABNORMAL HIGH (ref 70–99)
Glucose-Capillary: 116 mg/dL — ABNORMAL HIGH (ref 70–99)
Glucose-Capillary: 119 mg/dL — ABNORMAL HIGH (ref 70–99)
Glucose-Capillary: 79 mg/dL (ref 70–99)
Glucose-Capillary: 79 mg/dL (ref 70–99)
Glucose-Capillary: 89 mg/dL (ref 70–99)
Glucose-Capillary: 96 mg/dL (ref 70–99)

## 2021-10-29 MED ORDER — TAMSULOSIN HCL 0.4 MG PO CAPS
0.4000 mg | ORAL_CAPSULE | Freq: Every day | ORAL | Status: DC
  Administered 2021-10-29 – 2021-11-01 (×4): 0.4 mg via ORAL
  Filled 2021-10-29 (×4): qty 1

## 2021-10-29 NOTE — Progress Notes (Signed)
Attempted to call report to [phone] x2, nurse unavailable for report. Will pass to nightshift to call report next shift

## 2021-10-29 NOTE — Progress Notes (Signed)
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  He admits to drinking one half of 1/5 of whiskey daily.  He is mostly complaining of pain to his mid and lower back.  His home was noted to be in terrible condition and he was covered and bedbugs and all of his clothing had to be removed and he had to be bathed in the ED.  He was sent for work-up in the ED including CT scans and x-rays and noted to have displaced rib fractures in the posterior left 11th and 12th ribs and mildly displaced left 10th rib fracture and severe hepatic steatosis and left L2 transverse process acute fracture.  He is notably in severe pain and noted to be hyponatremic as well.  Admission was requested for pain management and possible placement.  He has severe problems with frequent falling likely related to alcohol intoxication.   Assessment and Plan: 1) acute hypoxic respiratory failure--- in the setting of aspiration pneumonia and full-blown delirium tremens -Intubated 10/19/2021 -Pulmonary consult appreciated -Continue as needed clonazepam -Successfully extubated on 10/27/2021 -Continue to wean of oxygen supplementation as tolerated. -Patient will be assessed for another 24 hours in the stepdown and if he remains in stable for transfer to telemetry floor.   2)multiple rib fractures/Lumbar transverse process fracture/Falls - TLSO brace to assist with severe rib fracture pain when he comes off  vent -Continue as needed analgesics.  3)Aspiration pneumonia with acute hypoxic respiratory failure -In the setting of alcohol abuse/delirium tremens  -Continue IV Unasyn (started on 10/19/2021) -c/n  bronchodilators, -Currently intubated/ventilated -Planning for a total of 8 days of antibiotics; discussed with pharmacy for end date placement. -Has remained afebrile.  4)Delirium tremens-- -Continue as needed clonazepam -Mentation back to baseline; no active withdrawal seen.  5)Hypomagnesemia/hyponatremia -- Continue to maintain adequate hydration and further replete electrolytes as needed. -Continue to follow electrolytes trend and further replete as needed.  6)Macrocytic anemia - serum B12  Is 973 -Continue repletion of folic acid -Macrocytosis appears to be in the setting of alcohol abuse/consumption.  7)Bedbug infestation - Reportedly noted to be living in substandard horrible conditions according to [facility] report - He has been thoroughly cleansed and clothing changed out in the emergency department - [facility] consultation with social work for APS referral.  8)Severe hepatic steatosis/alcoholic hepatitis - Secondary to chronic alcohol abuse - He should follow-up with GI on outpatient basis for ongoing surveillance.  9)Failure to thrive in adult/severe protein caloric malnutrition - Patient is severely deconditioned and malnourished and living in substandard conditions and we have asked for a physical therapy consultation and TOC social work consultation for possible APS referral and placement in a safe for home environment. -Dietitian consult appreciated; will follow recommendations. -Continue feeding supplements and diet initiation.  Will follow response and Ability for safe swallowing.  10)Pneumoconiosis, coal, workers' [facility]) - smoker -Continue the use of bronchodilators as needed.  11)HTN--- had hypotension post-intubation -required Levophed for pressure support -Very  likely in the setting of sedation -Stable  blood pressure currently -Patient off pressors. -Flomax at suppertime will be added.  12)FEN- -Having multiple BMs -Rectal tube in place. -After seen by speech therapy plan is for dysphagia 1 with honey thick liquids. -Follow progress. -Continue feeding supplements.  13)Social/Ethics- -I have been unable to reach family/next of kin for decision-making purposes--DSS/APS notified. -TOC and palliative care helping to reach family.  After discussing with patient who is fully oriented at this point reports that he was living by himself and no other family members or next of kin available for contacts. -Continue to follow progress. -Plan is for discharge to skilled nursing facility for rehabilitation and conditioning. -Physical therapy has recommended skilled nursing facility at discharge for conditioning.  14)Falls frequently - This is most likely secondary to chronic alcohol intoxication in addition to substandard living conditions - PT evaluation recommending SNF rehab  - Fall precautions recommended --working on SNF rehab placement; physical therapy has reevaluated patient with recommendations for rehab at skilled nursing facility.  15) urinary retention -Patient failed voiding trial and Foley catheter has been reinserted. -Flomax will be added and will reassess in 48 hours.  If unable to void plan will be for patient to be discharged with catheter in place and follow-up as an outpatient with urology service.  DVT prophylaxis: enoxaparin Code Status: full code Family Communication: none present  Disposition: Extubated, physically weak/deconditioned.  Advancing diet as per speech therapy recommendations and completed antibiotic treatment.  Insurance authorization requested for discharge to a skilled nursing facility for further care and rehab.  Status is: Inpatient  Remains inpatient appropriate because: IV fluids, electrolyte replacement ,  intubated and sedated   Consultants: palliative care, PT /pulmonary critical care  Procedures:  10/19/2021 intubation Antimicrobials:  Unasyn started on 10/19/2021 (planning for a total of 8 days).  Subjective: Good saturation on 2-3 L nasal cannula supplementation.  Intermittent productive coughing spells reported.  So far tolerating dysphagia 1 with honey thick liquids.  Patient is afebrile.  No nausea, no vomiting.  Objective: Vitals:   10/29/21 1308 10/29/21 1605 10/29/21 1615 10/29/21 1641  BP:   134/63   Pulse:   85   Resp:   20   Temp:  98.2 F (36.8 C)    TempSrc:  Oral    SpO2: 93%   97%  Weight:      Height:        Intake/Output Summary (Last 24 hours) at 10/29/2021 1817 Last data filed at 10/29/2021 1200 Gross per 24 hour  Intake 630 ml  Output 1700 ml  Net -1070 ml   Filed Weights   10/27/21 0500 10/28/21 0500 10/29/21 0500  Weight: 70 kg 67.8 kg 65.6 kg   Physical Exam General exam: Alert, awake, oriented x 3; no swelling good saturation on 2-3 L supplementation; still with productive coughing spells and generalized weakness.  Chronically ill in appearance. Respiratory system: Positive rhonchi bilaterally; no wheezing on today's auscultation; no using accessory muscles.  Good saturation on current oxygen supplementation through nasal cannula. Cardiovascular system:Rate controlled; no rubs, no gallops, no JVD on exam. Gastrointestinal system: Abdomen is nondistended, soft and nontender. No organomegaly or masses felt. Normal bowel sounds heard. Central nervous system: Alert and oriented. No focal neurological deficits. Extremities: No cyanosis or clubbing. Skin: No petechiae. Psychiatry: Judgement and insight appear normal. Mood & affect appropriate.    Data Reviewed: I have personally reviewed following labs and imaging studies  CBC: Recent Labs  Lab 10/24/21 0419 10/26/21 0411  WBC 6.6 4.9  HGB 8.8* 8.6*  HCT 26.9* 27.5*  MCV 103.9* 105.0*  PLT 305  A999333    Basic Metabolic Panel: Recent Labs  Lab 10/23/21 0445 10/24/21 0419 10/26/21 0411  NA 138 136 137  K 4.1 3.7 3.8  CL 107 107 103  CO2 25 24 27   GLUCOSE 122* 150* 120*  BUN 22 19 16   CREATININE 0.49* 0.50* 0.46*  CALCIUM 8.3* 8.2* 8.0*    CBG: Recent Labs  Lab 10/29/21 0017 10/29/21 0405 10/29/21 0721 10/29/21 1114 10/29/21 1604  GLUCAP 89 79 79 119* 96    Recent Results (from the past 240 hour(s))  Culture, Respiratory w Gram Stain     Status: None   Collection Time: 10/20/21  3:00 PM   Specimen: Tracheal Aspirate; Respiratory  Result Value Ref Range Status   Specimen Description   Final    TRACHEAL ASPIRATE Performed at [facility], [address] [place], [place] [address]    Special Requests   Final    NONE Performed at [facility], [address] [place], [place] [address]    Gram Stain   Final    NO SQUAMOUS EPITHELIAL CELLS SEEN FEW WBC SEEN FEW GRAM POSITIVE COCCI    Culture   Final    MODERATE Normal respiratory flora-no Staph aureus or Pseudomonas seen Performed at [facility] [address] [place], [place] [address]    Report Status 10/23/2021 FINAL  Final     Radiology Studies: No results found.  Scheduled Meds:  Chlorhexidine Gluconate Cloth  6 each Topical Daily   cholecalciferol  1,000 Units Oral Daily   clonazePAM  1 mg Oral BID   cloNIDine  0.1 mg Oral TID   enoxaparin (LOVENOX) injection  40 mg Subcutaneous [id number]   folic acid  1 mg Oral Daily   ipratropium-albuterol  3 mL Nebulization QID   multivitamin with minerals  1 tablet Oral Daily   nicotine  21 mg Transdermal Daily   pantoprazole sodium  40 mg Oral Daily   thiamine  100 mg Oral Daily   Or   thiamine  100 mg Intravenous Daily   vitamin B-12  1,000 mcg Oral Daily   Continuous Infusions:  sodium chloride Stopped (10/22/21 0802)    LOS: 16 days   Barton Dubois, MD How to contact the [facility] Attending or Consulting provider [place] or covering provider  during after hours [place], for this patient?  Check the care team in [facility] and look for a) attending/consulting TRH provider listed and b) the [facility] team listed Log into [email or web address] and use [facility]'s universal password to access. If you do not have the password, please contact the hospital operator. Locate the [facility] provider you are looking for under [facility] and page to a number that you can be directly reached. If you still have difficulty reaching the provider, please page the [facility] (Director on Call) for the Hospitalists listed on amion for assistance.  10/29/2021, 6:17 PM

## 2021-10-30 DIAGNOSIS — J9601 Acute respiratory failure with hypoxia: Secondary | ICD-10-CM | POA: Diagnosis not present

## 2021-10-30 DIAGNOSIS — Z515 Encounter for palliative care: Secondary | ICD-10-CM | POA: Diagnosis not present

## 2021-10-30 DIAGNOSIS — R52 Pain, unspecified: Secondary | ICD-10-CM | POA: Diagnosis not present

## 2021-10-30 DIAGNOSIS — Z7189 Other specified counseling: Secondary | ICD-10-CM | POA: Diagnosis not present

## 2021-10-30 LAB — GLUCOSE, CAPILLARY
Glucose-Capillary: 65 mg/dL — ABNORMAL LOW (ref 70–99)
Glucose-Capillary: 128 mg/dL — ABNORMAL HIGH (ref 70–99)
Glucose-Capillary: 105 mg/dL — ABNORMAL HIGH (ref 70–99)
Glucose-Capillary: 126 mg/dL — ABNORMAL HIGH (ref 70–99)
Glucose-Capillary: 116 mg/dL — ABNORMAL HIGH (ref 70–99)
Glucose-Capillary: 106 mg/dL — ABNORMAL HIGH (ref 70–99)

## 2021-10-30 MED ORDER — LORAZEPAM 2 MG/ML IJ SOLN: 1.0000 mg | INTRAMUSCULAR | Status: DC | PRN

## 2021-10-30 MED ORDER — DEXTROSE 50 % IV SOLN
12.5000 g | INTRAVENOUS | Status: AC
  Administered 2021-10-30: 12.5 g via INTRAVENOUS

## 2021-10-30 MED ORDER — DEXTROSE 50 % IV SOLN
INTRAVENOUS | Status: AC
  Filled 2021-10-30: qty 50

## 2021-10-30 NOTE — Progress Notes (Signed)
   10/30/21 1634  CIWA-Ar  Nausea and Vomiting 2  Tactile Disturbances 1  Tremor 4  Auditory Disturbances 0  Paroxysmal Sweats 0  Visual Disturbances 0  Anxiety 1  Headache, Fullness in Head 2  Agitation 3  Orientation and Clouding of Sensorium 0  CIWA-Ar Total 13   No active order in for ativan. MD [facility] notified.

## 2021-10-30 NOTE — Progress Notes (Signed)
   10/30/21 1706  CIWA-Ar  Nausea and Vomiting 0 (patient is asleep)  Tactile Disturbances 0 (patient is asleep)  Tremor 0 (patient is asleep)  Auditory Disturbances 0 (patient is asleep)  Paroxysmal Sweats 0 (patient is asleep)  Visual Disturbances 0 (patient is asleep)  Anxiety 0 (patient is asleep)  Headache, Fullness in Head 0 (patient is asleep)  Agitation 0 (patient is asleep)  Orientation and Clouding of Sensorium 0 (patient is asleep)  CIWA-Ar Total 0   Reassessed patient and patient is asleep. MD Gwenlyn Perking made aware that this LPN will reassess when awake.

## 2021-10-30 NOTE — Progress Notes (Signed)
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  He admits to drinking one half of 1/5 of whiskey daily.  He is mostly complaining of pain to his mid and lower back.  His home was noted to be in terrible condition and he was covered and bedbugs and all of his clothing had to be removed and he had to be bathed in the ED.  He was sent for work-up in the ED including CT scans and x-rays and noted to have displaced rib fractures in the posterior left 11th and 12th ribs and mildly displaced left 10th rib fracture and severe hepatic steatosis and left L2 transverse process acute fracture.  He is notably in severe pain and noted to be hyponatremic as well.  Admission was requested for pain management and possible placement.  He has severe problems with frequent falling likely related to alcohol intoxication.   Assessment and Plan: 1) acute hypoxic respiratory failure--- in the setting of aspiration pneumonia and full-blown delirium tremens -Intubated 10/19/2021 -Pulmonary consult appreciated -Continue as needed clonazepam -Successfully extubated on 10/27/2021 -Continue to wean of oxygen supplementation as tolerated. -Patient responded adequately to medical management and hopefully to skilled nursing facility in the next 24 to 48 hours.  2)multiple rib fractures/Lumbar transverse process fracture/Falls - TLSO brace to assist with severe rib fracture pain when he comes off  vent -Continue as needed analgesics.  3)Aspiration pneumonia with acute hypoxic respiratory failure -In the setting of alcohol abuse/delirium tremens  -Continue IV Unasyn (started on 10/19/2021) -Continue the use of bronchodilators as needed -Currently intubated/ventilated -Patient completed treatment of antibiotics on 10/29/2021 (a total of 8 days provided). -Has remained afebrile.  4)Delirium tremens-- -Continue as needed clonazepam -Mentation back to baseline; no active withdrawal seen.  5)Hypomagnesemia/hyponatremia -- Continue to maintain adequate hydration and further replete electrolytes as needed. -Continue to follow electrolytes trend and further replete as needed.  6)Macrocytic anemia - serum B12  Is 973 -Continue repletion of folic acid -Macrocytosis appears to be in the setting of alcohol abuse/consumption. -Alcohol cessation counseling provided.  7)Bedbug infestation - Reportedly noted to be living in substandard horrible conditions according to [facility] report - He has been thoroughly cleansed and clothing changed out in the emergency department - [facility] consultation with social work for APS referral.  8)Severe hepatic steatosis/alcoholic hepatitis - Secondary to chronic alcohol abuse - He should follow-up with GI on outpatient basis for ongoing surveillance.  9)Failure to thrive in adult/severe protein caloric malnutrition - Patient is severely deconditioned and malnourished and living in substandard conditions and we have asked for a physical therapy consultation and TOC social work consultation for possible APS referral and placement in a safe for home environment. -Dietitian consult appreciated; will follow recommendations. -Continue feeding supplements and diet initiation.  Will follow response and Ability for safe swallowing.  10)Pneumoconiosis, coal, workers' [facility]) - smoker -Continue the use of bronchodilators as needed.  11)HTN--- had hypotension  post-intubation -required Levophed for pressure support -Very likely in the setting of sedation -  Stable blood pressure currently -Patient off pressors. -Flomax at suppertime will be added.  12)FEN- -Having multiple BMs -Rectal tube in place. -After seen by speech therapy plan is for dysphagia 1 with honey thick liquids. -Planning reassessment by speech therapy on 10/31/2021. -Follow progress. -Continue feeding supplements.  13)Social/Ethics- -I have been unable to reach family/next of kin for decision-making purposes--DSS/APS notified. -TOC and palliative care helping to reach family.  After discussing with patient who is fully oriented at this point reports that he was living by himself and no other family members or next of kin available for contacts. -Continue to follow progress. -Plan is for discharge to skilled nursing facility for rehabilitation and conditioning. -Physical therapy has recommended skilled nursing facility at discharge for conditioning. -Waiting insurance authorization for discharge to nursing home.  14)Falls frequently - This is most likely secondary to chronic alcohol intoxication in addition to substandard living conditions - PT evaluation recommending SNF rehab  - Fall precautions recommended --working on SNF rehab placement; physical therapy has reevaluated patient with recommendations for rehab at skilled nursing facility.  15) urinary retention -Patient failed voiding trial and Foley catheter has been reinserted. -Continue Flomax -Attempt voiding trial on 10/31/2021.  DVT prophylaxis: enoxaparin Code Status: full code Family Communication: none present  Disposition: Extubated, physically weak/deconditioned.  Advancing diet as per speech therapy recommendations and completed antibiotic treatment.  Insurance authorization requested for discharge to a skilled nursing facility for further care and rehab.  Status is: Inpatient  Remains inpatient  appropriate because: IV fluids, electrolyte replacement , intubated and sedated   Consultants: palliative care, PT /pulmonary critical care  Procedures:  10/19/2021 intubation Antimicrobials:  Unasyn started on 10/19/2021 (planning for a total of 8 days).  Subjective: Good saturation on 2 L; continue to have intermittent coughing spells and mild difficulty clearing secretions.  No fever, no nausea, no vomiting.  Generalized weakness and deconditioning appreciated.  Objective: Vitals:   10/30/21 1513 10/30/21 1534 10/30/21 1635 10/30/21 1643  BP:  (!) 153/73    Pulse: 78  89   Resp: 20     Temp:      TempSrc:      SpO2: 96%  94% 91%  Weight:      Height:        Intake/Output Summary (Last 24 hours) at 10/30/2021 1901 Last data filed at 10/30/2021 1700 Gross per 24 hour  Intake 440 ml  Output 1050 ml  Net -610 ml   Filed Weights   10/28/21 0500 10/29/21 0500 10/30/21 0454  Weight: 67.8 kg 65.6 kg 63.5 kg   Physical Exam General exam: Alert, awake, oriented x 3; demonstrating good saturation on 2 L supplementation.  Still experiencing mildly productive coughing spells and demonstrating intermittent difficulty clearing secretions.  No chest pain, no nausea, no vomiting.  Generalized weakness/deconditioning and mild tremors seen on examination. Respiratory system: Positive rhonchi, no wheezing, no using accessory muscles. Cardiovascular system:RRR. No rubs, gallops or JVD. Gastrointestinal system: Abdomen is nondistended, soft and nontender. No organomegaly or masses felt. Normal bowel sounds heard. Central nervous system: Alert and oriented. No focal neurological deficits. Extremities: No cyanosis or clubbing. Skin: No petechiae. Psychiatry: Judgement and insight appear normal. Mood & affect appropriate.   Data Reviewed: I have personally reviewed following labs and imaging studies  CBC: Recent Labs  Lab 10/24/21 0419 10/26/21 0411  WBC 6.6 4.9  HGB 8.8* 8.6*  HCT 26.9*  27.5*  MCV 103.9* 105.0*  PLT 305 239    Basic  Metabolic Panel: Recent Labs  Lab 10/24/21 0419 10/26/21 0411  NA 136 137  K 3.7 3.8  CL 107 103  CO2 24 27  GLUCOSE 150* 120*  BUN 19 16  CREATININE 0.50* 0.46*  CALCIUM 8.2* 8.0*    CBG: Recent Labs  Lab 10/30/21 0423 10/30/21 0507 10/30/21 0744 10/30/21 1127 10/30/21 1628  GLUCAP 65* 128* 105* 126* 116*    No results found for this or any previous visit (from the past 240 hour(s)).    Radiology Studies: No results found.  Scheduled Meds:  Chlorhexidine Gluconate Cloth  6 each Topical Daily   cholecalciferol  1,000 Units Oral Daily   clonazePAM  1 mg Oral BID   cloNIDine  0.1 mg Oral TID   enoxaparin (LOVENOX) injection  40 mg Subcutaneous A999333   folic acid  1 mg Oral Daily   ipratropium-albuterol  3 mL Nebulization QID   multivitamin with minerals  1 tablet Oral Daily   nicotine  21 mg Transdermal Daily   pantoprazole sodium  40 mg Oral Daily   tamsulosin  0.4 mg Oral QPC supper   thiamine  100 mg Oral Daily   Or   thiamine  100 mg Intravenous Daily   vitamin B-12  1,000 mcg Oral Daily   Continuous Infusions:  sodium chloride Stopped (10/22/21 0802)    LOS: 17 days   Barton Dubois, MD How to contact the [facility] Attending or Consulting provider [place] or covering provider during after hours [place], for this patient?  Check the care team in [facility] and look for a) attending/consulting TRH provider listed and b) the [facility] team listed Log into [email or web address] and use [place]'s universal password to access. If you do not have the password, please contact the hospital operator. Locate the [facility] provider you are looking for under [facility] and page to a number that you can be directly reached. If you still have difficulty reaching the provider, please page the [facility] (Director on Call) for the Hospitalists listed on amion for assistance.  10/30/2021, 7:01 PM

## 2021-10-31 DIAGNOSIS — Z7189 Other specified counseling: Secondary | ICD-10-CM | POA: Diagnosis not present

## 2021-10-31 DIAGNOSIS — J9601 Acute respiratory failure with hypoxia: Secondary | ICD-10-CM | POA: Diagnosis not present

## 2021-10-31 DIAGNOSIS — R52 Pain, unspecified: Secondary | ICD-10-CM | POA: Diagnosis not present

## 2021-10-31 DIAGNOSIS — Z515 Encounter for palliative care: Secondary | ICD-10-CM | POA: Diagnosis not present

## 2021-10-31 LAB — GLUCOSE, CAPILLARY
Glucose-Capillary: 100 mg/dL — ABNORMAL HIGH (ref 70–99)
Glucose-Capillary: 117 mg/dL — ABNORMAL HIGH (ref 70–99)
Glucose-Capillary: 85 mg/dL (ref 70–99)
Glucose-Capillary: 91 mg/dL (ref 70–99)
Glucose-Capillary: 97 mg/dL (ref 70–99)

## 2021-10-31 NOTE — Progress Notes (Signed)
Speech Language Pathology Treatment: Dysphagia  Patient Details Name: Larry [name] MRN: [id number] DOB: 03/04/1951 Today's Date: 10/31/2021 Time: 1540-0867 SLP Time Calculation (min) (ACUTE ONLY): 24 min  Assessment / Plan / Recommendation Clinical Impression  Pt seen for ongoing dysphagia intervention and presents with modest improvement from last week. Pt continues to present with congested cough present before and during po trials this date. Oral care completed, coated tongue noted, and upper dentures placed. Pt has a strong cough. It is difficult to determine if coughing is from lung congestion or if also made worse by poor airway protection with liquids. Pt does appear to better tolerate (diminished coughing) with thickened liquids. Recommend D2 and continue honey thick liquids and plan for MBSS prior to d/c to further assess swallow function. Will try to schedule MBSS for tomorrow.   HPI HPI: 71 year old EtOH user, smoker BIBEMS on 6/22 for generalized weakness and multiple falls over the past several months.  He had pain in his mid to lower back .  His home was in a terrible condition, there were bedbugs.  He was severely malnourished initial imaging showed displaced rib fracture posterior left 10th, 11th and 12th ribs and left T2 transverse process fracture.  Initial labs were significant for hyponatremia.  Social work and APS referrals were made  On 6/28 he was noted to be confused, disoriented with tremors consistent with DTs.  CIWA protocol was started and he was transferred to stepdown.  He was noted to be febrile, coughing and dyspnea consistent with aspiration.  He required intubation and mechanical ventilation, hypotensive requiring fluid bolus and Levophed drip. Pt extubated this AM and BSE requested.      SLP Plan  Continue with current plan of care;MBS      Recommendations for follow up therapy are one component of a multi-disciplinary discharge planning process, led by the  attending physician.  Recommendations may be updated based on patient status, additional functional criteria and insurance authorization.    Recommendations  Diet recommendations: Dysphagia 2 (fine chop);Honey-thick liquid Liquids provided via: Cup;Straw Medication Administration: Whole meds with puree Supervision: Staff to assist with self feeding;Full supervision/cueing for compensatory strategies Compensations: Slow rate;Small sips/bites;Multiple dry swallows after each bite/sip;Clear throat intermittently Postural Changes and/or Swallow Maneuvers: Seated upright 90 degrees;Upright 30-60 min after meal                Oral Care Recommendations: Staff/trained caregiver to provide oral care;Oral care BID Follow Up Recommendations: Skilled nursing-short term rehab (<3 hours/day) Assistance recommended at discharge: Frequent or constant Supervision/Assistance SLP Visit Diagnosis: Dysphagia, unspecified (R13.10) Plan: Continue with current plan of care;MBS         Thank you,  Larry [name], CCC-SLP [phone]   Larry [name]  10/31/2021, 3:09 PM

## 2021-10-31 NOTE — Progress Notes (Signed)
Palliative: [name], Larry [name], is lying quietly in bed.  He appears acutely/chronically ill and very frail.  He is alert, able to tell me his name, and that we are in the hospital although he states "[facility]", he is unable to tell me which city.  He tells me that he needs a urinal.  He does not respond to questions about time.  Urinal provided.  He is able to hold a urinal for himself.  There is no family at bedside, although nursing staff is present.  I asked Larry [name] if he remembers being on the ventilator.  He tells me that he does.  I ask if he would want to be on the life support again if needed.  He tells me that he would not want to be on life support again, but then tells me that he does not need it.  We continue to discuss the possible need for ventilator support in the future.  He continues to state that he would not need it.  Conference with attending, bedside nursing staff, transition of care team related to patient condition, needs, goals of care, disposition.  Plan: At this point continue full scope/full code.  Time for outcomes.  Adult protective services referral made.  Would benefit from outpatient palliative services to continue goals of care/CODE STATUS discussions.  Anticipate discharge to short-term rehab/long-term care.  35 minutes [name], NP Palliative medicine team Team phone [phone] Greater than 50% of this time was spent counseling and coordinating care related to the above assessment and plan.

## 2021-10-31 NOTE — Progress Notes (Signed)
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  He admits to drinking one half of 1/5 of whiskey daily.  He is mostly complaining of pain to his mid and lower back.  His home was noted to be in terrible condition and he was covered and bedbugs and all of his clothing had to be removed and he had to be bathed in the ED.  He was sent for work-up in the ED including CT scans and x-rays and noted to have displaced rib fractures in the posterior left 11th and 12th ribs and mildly displaced left 10th rib fracture and severe hepatic steatosis and left L2 transverse process acute fracture.  He is notably in severe pain and noted to be hyponatremic as well.  Admission was requested for pain management and possible placement.  He has severe problems with frequent falling likely related to alcohol intoxication.   Assessment and Plan: 1) acute hypoxic respiratory failure--- in the setting of aspiration pneumonia and full-blown delirium tremens -Intubated 10/19/2021 -Pulmonary consult appreciated -Continue as needed clonazepam -Successfully extubated on 10/27/2021 -Continue to wean of oxygen supplementation as tolerated. -Patient responded adequately to medical management and hopefully to skilled nursing facility in the next 24 hours.  2)multiple rib fractures/Lumbar transverse process fracture/Falls - TLSO brace to assist with severe rib fracture pain when he comes off  vent -Continue as needed analgesics. -Physical therapy and conditioning at a skilled nursing facility.  3)Aspiration pneumonia with acute hypoxic respiratory failure -In the setting of alcohol abuse/delirium tremens  -Continue IV Unasyn (started on 10/19/2021) -Continue the use of bronchodilators as needed -Patient completed treatment of antibiotics on 10/29/2021 (a total of 8 days provided). -Has remained afebrile. -Continue the use of flutter valve/incentive spirometer.  4)Delirium tremens-- -Continue as needed clonazepam -Mentation back to baseline; no active withdrawal seen.  5)Hypomagnesemia/hyponatremia -- Continue to maintain adequate hydration and further replete electrolytes as needed. -Continue to follow electrolytes trend and further replete as needed.  6)Macrocytic anemia - serum B12  Is 973 -Continue repletion of folic acid -Macrocytosis appears to be in the setting of alcohol abuse/consumption. -Alcohol cessation counseling provided.  7)Bedbug infestation - Reportedly noted to be living in substandard horrible conditions according to [facility] report - He has been thoroughly cleansed and clothing changed out in the emergency department - [facility] consultation with social work for APS referral.  8)Severe hepatic steatosis/alcoholic hepatitis - Secondary to chronic alcohol abuse - He should follow-up with GI on outpatient basis for ongoing surveillance.  9)Failure to thrive in adult/severe protein caloric malnutrition - Patient is severely deconditioned and malnourished and living in substandard conditions and we have asked for a physical therapy consultation and TOC social work consultation for possible APS referral and placement in a safe for home environment. -Dietitian consult appreciated; will follow recommendations. -Continue feeding supplements and diet initiation.  Will follow response and Ability for safe swallowing.  10)Pneumoconiosis, coal, workers' [facility]) -  smoker -Continue the use of bronchodilators as needed.  11)HTN--- had hypotension post-intubation -  required Levophed for pressure support -Very likely in the setting of sedation -Stable blood pressure currently -Patient off pressors. -Flomax at suppertime will be added.  12)FEN- -Rectal tube-discontinue -After seen by speech therapy plan is for dysphagia 1 with honey thick liquids. -Planning reassessment by speech therapy on 10/31/2021; will follow recommendations. -Follow progress. -Continue feeding supplements.  13)Social/Ethics- -I have been unable to reach family/next of kin for decision-making purposes--DSS/APS notified. -TOC and palliative care helping to reach family.  After discussing with patient who is fully oriented at this point reports that he was living by himself and no other family members or next of kin available for contacts. -Continue to follow progress. -Plan is for discharge to skilled nursing facility for rehabilitation and conditioning. -Physical therapy has recommended skilled nursing facility at discharge for conditioning. -Waiting insurance authorization for discharge to nursing home.  14)Falls frequently - This is most likely secondary to chronic alcohol intoxication in addition to substandard living conditions - PT evaluation recommending SNF rehab  - Fall precautions recommended --working on SNF rehab placement; physical therapy has reevaluated patient with recommendations for rehab at skilled nursing facility.  15) urinary retention -Patient failed voiding trial and Foley catheter has been reinserted. -will Continue Flomax -Attempt voiding trial; after catheter was removed patient able to urinate x1. -We will continue monitoring.  DVT prophylaxis: enoxaparin Code Status: full code Family Communication: none present  Disposition: Extubated, physically weak/deconditioned.  Advancing diet as per speech therapy recommendations and completed antibiotic  treatment.  Insurance authorization has been approved for placement at a skilled nursing facility.  If remains stable will discharge on 11/01/2021.  Status is: Inpatient  Remains inpatient appropriate because: IV fluids, electrolyte replacement , intubated and sedated   Consultants: palliative care, PT /pulmonary critical care  Procedures:  10/19/2021 intubation Antimicrobials:  Unasyn started on 10/19/2021 (planning for a total of 8 days).  Subjective: Good saturation on 2 L nasal cannula supplementation; oriented x3 and following commands appropriately.  Chronically ill and deconditioned in appearance.  Continues to have intermittent productive coughing spells.  Attempting voiding trial.  Objective: Vitals:   10/31/21 0518 10/31/21 0739 10/31/21 1148 10/31/21 1225  BP: (!) 172/70   (!) 148/82  Pulse: 95   86  Resp: 20   20  Temp: 98.3 F (36.8 C)   97.9 F (36.6 C)  TempSrc: Oral     SpO2: 96% 95% 95% 96%  Weight:      Height:        Intake/Output Summary (Last 24 hours) at 10/31/2021 1452 Last data filed at 10/31/2021 1300 Gross per 24 hour  Intake 220 ml  Output 1100 ml  Net -880 ml   Filed Weights   10/28/21 0500 10/29/21 0500 10/30/21 0454  Weight: 67.8 kg 65.6 kg 63.5 kg   Physical Exam General exam: Alert, awake, oriented x 3; following commands appropriately; not demonstrating withdrawal symptoms at this point.  Generally weak and chronically ill in appearance.  2 L nasal cannula supplementation in place with good saturation.  Continues to have intermittent productive coughing spells. Respiratory system: Positive rhonchi bilaterally; no using accessory muscles. Cardiovascular system:RRR.  No rubs or gallops; no JVD. Gastrointestinal system: Abdomen is nondistended, soft and nontender. No organomegaly or masses felt. Normal bowel sounds heard. Central nervous system: Alert and oriented. No focal neurological deficits. Extremities: No cyanosis or clubbing. Skin: No  petechiae. Psychiatry: Judgement and insight appear normal. Mood & affect appropriate.   Data Reviewed: I have personally reviewed  following labs and imaging studies  CBC: Recent Labs  Lab 10/26/21 0411  WBC 4.9  HGB 8.6*  HCT 27.5*  MCV 105.0*  PLT 239    Basic Metabolic Panel: Recent Labs  Lab 10/26/21 0411  NA 137  K 3.8  CL 103  CO2 27  GLUCOSE 120*  BUN 16  CREATININE 0.46*  CALCIUM 8.0*    CBG: Recent Labs  Lab 10/30/21 1628 10/30/21 2105 10/31/21 0010 10/31/21 0725 10/31/21 1116  GLUCAP 116* 106* 100* 85 91    No results found for this or any previous visit (from the past 240 hour(s)).    Radiology Studies: No results found.  Scheduled Meds:  Chlorhexidine Gluconate Cloth  6 each Topical Daily   cholecalciferol  1,000 Units Oral Daily   clonazePAM  1 mg Oral BID   cloNIDine  0.1 mg Oral TID   enoxaparin (LOVENOX) injection  40 mg Subcutaneous Q24H   folic acid  1 mg Oral Daily   ipratropium-albuterol  3 mL Nebulization QID   multivitamin with minerals  1 tablet Oral Daily   nicotine  21 mg Transdermal Daily   pantoprazole sodium  40 mg Oral Daily   tamsulosin  0.4 mg Oral QPC supper   thiamine  100 mg Oral Daily   Or   thiamine  100 mg Intravenous Daily   vitamin B-12  1,000 mcg Oral Daily   Continuous Infusions:  sodium chloride Stopped (10/22/21 0802)    LOS: 18 days   Vassie Loll, MD How to contact the [facility] Attending or Consulting provider 7A - 7P or covering provider during after hours 7P -7A, for this patient?  Check the care team in [facility] and look for a) attending/consulting TRH provider listed and b) the [facility] team listed Log into [email or web address] and use [place]'s universal password to access. If you do not have the password, please contact the hospital operator. Locate the [facility] provider you are looking for under [facility] and page to a number that you can be directly reached. If you still have difficulty reaching the  provider, please page the [facility] (Director on Call) for the Hospitalists listed on amion for assistance.  10/31/2021, 2:52 PM

## 2021-10-31 NOTE — Progress Notes (Signed)
Nutrition Follow-up  DOCUMENTATION CODES:   Severe malnutrition in context of social or environmental circumstances, Underweight  INTERVENTION:  -Magic cup with meals TID  -assist with feeding all meals  -encourage oral intake   NUTRITION DIAGNOSIS:   Severe Malnutrition related to social / environmental circumstances as evidenced by severe fat depletion, severe muscle depletion.  - diet advanced  GOAL:   Patient will meet greater than or equal to 90% of their needs  -ongoing   MONITOR:   PO intake, Supplement acceptance, Labs, Weight trends    ASSESSMENT:  Pt admitted from home with uncontrolled pain and weakness. 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  On 6/29 patient started in Osmolite 1.2 @ 20 ml/hr to advance to goal rate of 50 ml/hr (1200 ml). Provides: 1440 kcal, 66 gr protein and 984 ml water. Patient will need additional protein to meet needs- Prosource TF 45 ml TID per tube.   7/3 Patient remains intubated on ventilator support. Failed weaning attempt this morning. Discussed with nursing. Patient continues to tolerate tube feeding (regimen noted below). Flexiseal placed 7/2 and no skin breakdown reported. Weight gain noted.  7/5 Patient eyes open today during RD visit. Nursing in room providing care. He continues on enteral feeding at goal. No problems with tolerance. Possible wean attempt tomorrow. I/O's reviewed. Since admission- +10.678 liters  7/10 Patient out of ICU. Awake during RD visit. His lunch has arrived and nursing is working with him teaching how to suction his secretions. Tray set up provided- needs assistance with feeding. Based on meal intake review 7/8-100%, 7/9 50%, 7/10 -0%. Estimated nutrition needs reassessed.     Latest Ref Rng & Units 10/26/2021    4:11 AM 10/24/2021    4:19 AM  10/23/2021    4:45 AM  BMP  Glucose 70 - 99 mg/dL 062  376  283   BUN 8 - 23 mg/dL 16  19  22    Creatinine 0.61 - 1.24 mg/dL  1.51  7.61   Sodium 135 - 145 mmol/L 137  136  138   Potassium 3.5 - 5.1 mmol/L 3.8  3.7  4.1   Chloride 98 - 111 mmol/L 103  107  107   CO2 22 - 32 mmol/L 27  24  25    Calcium 8.9 - 10.3 mg/dL 8.0  8.2  8.3      Diet Order:   Diet Order             DIET - DYS 1 Room service appropriate? Yes; Fluid consistency: Honey Thick  Diet effective now                   EDUCATION NEEDS:   No education needs have been identified at this time  Skin:  Skin Assessment: Reviewed RN Assessment  Last BM:  7/8 (rectal tube 7/2->7/6)  Height:   Ht Readings from Last 1 Encounters:  10/30/21 6\' 2"  (1.88 m)    Weight:   Wt Readings from Last 1 Encounters:  10/30/21 63.5 kg  Admit weight- 61.2 kg  Ideal Body Weight:   86 kg  BMI:  Body mass index is 17.97 kg/m.  Estimated Nutritional Needs:   Kcal:  1900-2100  Protein:  95-102 gr  Fluid:  >1600 ml   12/31/21 MS,RD,CSG,LDN Contact: AMION

## 2021-10-31 NOTE — Care Management Important Message (Signed)
Important Message  Patient Details  Name: MURVIN GIFT MRN: [id number] Date of Birth: 1951-03-01   Medicare Important Message Given:  Yes     Corey Harold 10/31/2021, 10:14 AM

## 2021-10-31 NOTE — TOC Progression Note (Signed)
Transition of Care [facility]) - Progression Note    Patient Details  Name: Larry [name] MRN: [id number] Date of Birth: 07/15/1950  Transition of Care [facility]) CM/SW Contact  Annice Needy, LCSW Phone Number: 10/31/2021, 10:25 AM  Clinical Narrative:    Patient is authorized for SNF. Berkley Harvey is valid from 7/10-7/12.   Expected Discharge Plan: Skilled Nursing Facility Barriers to Discharge: Continued Medical Work up  Expected Discharge Plan and Services Expected Discharge Plan: Skilled Nursing Facility     Post Acute Care Choice: Skilled Nursing Facility Living arrangements for the past 2 months: Single Family Home                                       Social Determinants of Health (SDOH) Interventions    Readmission Risk Interventions    03/10/2021    2:14 PM 02/11/2021    1:11 PM  Readmission Risk Prevention Plan  Medication Screening Complete   Transportation Screening Complete Complete  Home Care Screening  Complete  Medication Review (RN CM)  Complete

## 2021-11-01 ENCOUNTER — Inpatient Hospital Stay (HOSPITAL_COMMUNITY): Payer: Medicare HMO

## 2021-11-01 DIAGNOSIS — R52 Pain, unspecified: Secondary | ICD-10-CM | POA: Diagnosis not present

## 2021-11-01 DIAGNOSIS — T17908D Unspecified foreign body in respiratory tract, part unspecified causing other injury, subsequent encounter: Secondary | ICD-10-CM | POA: Diagnosis not present

## 2021-11-01 DIAGNOSIS — F1010 Alcohol abuse, uncomplicated: Secondary | ICD-10-CM | POA: Diagnosis not present

## 2021-11-01 DIAGNOSIS — R5381 Other malaise: Secondary | ICD-10-CM

## 2021-11-01 DIAGNOSIS — R1310 Dysphagia, unspecified: Secondary | ICD-10-CM

## 2021-11-01 DIAGNOSIS — S32009D Unspecified fracture of unspecified lumbar vertebra, subsequent encounter for fracture with routine healing: Secondary | ICD-10-CM

## 2021-11-01 DIAGNOSIS — N400 Enlarged prostate without lower urinary tract symptoms: Secondary | ICD-10-CM | POA: Diagnosis present

## 2021-11-01 DIAGNOSIS — S2241XD Multiple fractures of ribs, right side, subsequent encounter for fracture with routine healing: Secondary | ICD-10-CM

## 2021-11-01 DIAGNOSIS — D696 Thrombocytopenia, unspecified: Secondary | ICD-10-CM

## 2021-11-01 DIAGNOSIS — J9601 Acute respiratory failure with hypoxia: Secondary | ICD-10-CM | POA: Diagnosis not present

## 2021-11-01 LAB — GLUCOSE, CAPILLARY
Glucose-Capillary: 100 mg/dL — ABNORMAL HIGH (ref 70–99)
Glucose-Capillary: 105 mg/dL — ABNORMAL HIGH (ref 70–99)
Glucose-Capillary: 106 mg/dL — ABNORMAL HIGH (ref 70–99)
Glucose-Capillary: 106 mg/dL — ABNORMAL HIGH (ref 70–99)
Glucose-Capillary: 110 mg/dL — ABNORMAL HIGH (ref 70–99)
Glucose-Capillary: 118 mg/dL — ABNORMAL HIGH (ref 70–99)
Glucose-Capillary: 97 mg/dL (ref 70–99)

## 2021-11-01 MED ORDER — OXYCODONE HCL 5 MG PO TABS: 5.0000 mg | ORAL_TABLET | Freq: Four times a day (QID) | ORAL | 0 refills | Status: DC | PRN

## 2021-11-01 MED ORDER — IPRATROPIUM-ALBUTEROL 0.5-2.5 (3) MG/3ML IN SOLN: 3.0000 mL | Freq: Three times a day (TID) | RESPIRATORY_TRACT | Status: DC

## 2021-11-01 MED ORDER — TAMSULOSIN HCL 0.4 MG PO CAPS: 0.4000 mg | ORAL_CAPSULE | Freq: Every day | ORAL | 2 refills | Status: DC

## 2021-11-01 MED ORDER — ACETAMINOPHEN 325 MG PO TABS: 650.0000 mg | ORAL_TABLET | Freq: Four times a day (QID) | ORAL | Status: AC | PRN

## 2021-11-01 MED ORDER — PANTOPRAZOLE SODIUM 40 MG PO TBEC: 40.0000 mg | DELAYED_RELEASE_TABLET | Freq: Every day | ORAL | 1 refills | Status: DC

## 2021-11-01 MED ORDER — CLONIDINE HCL 0.1 MG PO TABS: 0.1000 mg | ORAL_TABLET | Freq: Three times a day (TID) | ORAL | 2 refills | Status: DC

## 2021-11-01 MED ORDER — IPRATROPIUM-ALBUTEROL 0.5-2.5 (3) MG/3ML IN SOLN
3.0000 mL | Freq: Three times a day (TID) | RESPIRATORY_TRACT | Status: DC
  Administered 2021-11-01 – 2021-11-02 (×3): 3 mL via RESPIRATORY_TRACT
  Filled 2021-11-01 (×2): qty 3

## 2021-11-01 NOTE — Progress Notes (Signed)
Patient resting comfortably throughout the day, no complaints at this time.

## 2021-11-01 NOTE — Progress Notes (Signed)
Palliative: Stop by to see Mr. Goldring to continue goals of care discussion.  He was off the floor.   PMT to follow.  No charge [name], NP Palliative medicine team Team phone [phone] Greater than 50% of this time was spent counseling and coordinating care related to the above assessment and plan.

## 2021-11-01 NOTE — Discharge Summary (Signed)
Physician Discharge Summary   Patient: Larry [name] MRN: [id number] DOB: [birth year]  Admit date:     10/13/2021  Discharge date: 11/02/21  Discharge Physician: Vassie Loll   PCP: Benita Stabile, MD   Recommendations at discharge:  Reassess blood pressure and further adjust antihypertensive regimen as needed Repeat basic metabolic panel to evaluate lites renal function Goals of care and advanced directive care discussion recommended. Repeat CBC to follow hemoglobin and platelet count trend. See below for further recommendations.  Discharge Diagnoses: Principal Problem:   Uncontrolled pain Active Problems:   Falls frequently   Essential hypertension   Generalized weakness   Physical deconditioning   Elevated AST (SGOT)   Thrombocytopenia (HCC)   Elevated MCV   Pneumoconiosis, coal, workers' (HCC)   Protein-calorie malnutrition, severe   Failure to thrive in adult   Lumbar transverse process fracture   Alcohol abuse   Severe hepatic steatosis   Bedbug infestation   Multiple rib fractures   Macrocytic anemia   Hyponatremia   Hypomagnesemia   Somnolence   Possible Aspiration into airway   Acute respiratory failure with hypoxia and hypercapnia [facility])  Hospital Course: 71 year old gentleman who is a chronic de Holl abuser, smoker, with hypertension and multiple recent falls, pneumoconiosis from coal workers disease, chronic cough, failure to thrive, severe protein calorie malnutrition, thrombocytopenia, macrocytic anemia, presented to ED by [facility] from home complaining of generalized weakness and multiple falls and back pain.  He admits to drinking one half of 1/5 of whiskey daily.  He is mostly complaining of pain to his mid and lower back.  His home was noted to be in terrible condition and he was covered and bedbugs and all of his clothing had to be removed and he had to be bathed in the ED.  He was sent for work-up in the ED including CT scans and x-rays and noted to have  displaced rib fractures in the posterior left 11th and 12th ribs and mildly displaced left 10th rib fracture and severe hepatic steatosis and left L2 transverse process acute fracture.  He is notably in severe pain and noted to be hyponatremic as well.  Admission was requested for pain management and possible placement.  He has severe problems with frequent falling likely related to alcohol intoxication.  Assessment and Plan: * Uncontrolled pain - Pain management orders for moderate and severe pain, titrate as needed -- still not able to get pain controlled, we have asked patient to wear TLSO brace to assist with severe rib fracture pain   Dysphagia - In the setting of esophagitis probably from alcohol and intubation process -Seen by speech therapy with recommendations for dysphagia 2 and honey thick liquids. -Continue follow-up by speech therapy and physical therapy at a skilled nursing facility.  BPH (benign prostatic hyperplasia) - With symptoms of urinary retention -Ended initially requiring Foley catheter; with further improvement after Flomax initiated and successful voiding trial/Foley catheter removal prior to discharge.  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 -Continue bronchodilator management -Continue to wean oxygen supplementation as tolerated.  Possible Aspiration into airway -- Pt had a bad choking spell when taking meds this morning 6/27, likely aspirated, will request an SLP evaluation, did not get his morning meds today  Somnolence -- likely from librium and pain meds, I have decreased librium to 5 mg to PRN only and decreased oxycodone to 2.5 mg  Hypomagnesemia -- IV replacement given and repleted   Hyponatremia - Likely secondary to dehydration, treating with normal saline infusion -- Treated and resolved now  Macrocytic anemia - serum B12   Is 973, low folic acid level being repleted  Multiple rib fractures - Pain management and supportive care, brace ordered and PT evaluation requested - Fall precautions -- brace ordered  -- he needs more PT to work with him but due to staffing they have not been able to work with him for last couple of days   Bedbug infestation - Reportedly noted to be living in substandard horrible conditions according to [facility] report - He has been thoroughly cleansed and clothing changed out in the emergency department - [facility] consultation with social work for APS referral  Severe hepatic steatosis - Secondary to chronic alcohol abuse - He should follow-up with GI on outpatient basis for ongoing surveillance  Alcohol abuse - Patient unfortunately is a chronic heavy alcohol user and is high risk for acute alcohol withdrawal - He reports history of tremor from alcohol withdrawal. -He is a heavy user and we have placed him on a CIWA alcohol withdrawal protocol and starting Librium 25 mg p.o. 3 times daily which hopefully we can start to titrate down over the next several days if possible - Resume per protocol thiamine multivitamin and folic acid -B12 973, vitamin D 50,  folic acid level low at 3.8, supplementing now.  Lumbar transverse process fracture - Pain management and ordered TLSO brace for support and pain relief  Failure to thrive in adult - Patient is severely deconditioned and malnourished and living in substandard conditions and we have asked for a physical therapy consultation and TOC social work consultation for possible APS referral and placement in a safe for home environment.  Protein-calorie malnutrition, severe - Consultation to dietitian for treatment recommendations and monitor for refeeding syndrome  Pneumoconiosis, coal, workers' [facility]) - Patient continues smoking cigarettes and we have ordered nicotine patch for him as well as bronchodilators as needed  Elevated MCV - Secondary  to chronic alcohol abuse and nutritional deficiencies - Follow-up vitamin B12 and folic acid levels - Resume daily oral vitamin B12 supplement, multivitamin and folic acid  Thrombocytopenia (HCC) - Secondary to chronic alcohol abuse - Monitoring  Elevated AST (SGOT) - Secondary to chronic alcohol abuse unchanged from recent testing done in November 2022  Physical deconditioning -  PT recommending SNF placement -- He needs to get out of bed and ambulate more with his TLSO brace.  Ambulate with assistance orders placed.    Generalized weakness - Multifactorial given chronic alcohol abuse, multiple rib fractures and lumbar transverse process fracture - See work-up noted above, treating supportively and working on safe for home placement  Essential hypertension - Continue the use of clonidine and flows on max -Follow heart healthy/low-sodium diet.  Falls frequently - This is most likely secondary to chronic alcohol intoxication in addition to substandard living conditions - PT evaluation recommending SNF rehab  - Fall precautions recommended --working on SNF rehab placement     Consultants: PCCM Procedures performed: See below for x-ray reports. Disposition: Skilled nursing facility Diet recommendation:  Dysphagia 2 diet with honey thick liquids.  DISCHARGE MEDICATION: Allergies as of 11/01/2021   No Known Allergies      Medication List     STOP taking these medications    lisinopril 10 MG tablet Commonly known as: ZESTRIL   metoprolol tartrate 25 MG tablet Commonly known as: [employer]  senna-docusate 8.6-50 MG tablet Commonly known as: Senokot-S   traZODone 50 MG tablet Commonly known as: DESYREL       TAKE these medications    acetaminophen 325 MG tablet Commonly known as: Tylenol Take 2 tablets (650 mg total) by mouth every 6 (six) hours as needed for headache, mild pain or fever.   cholecalciferol 25 MCG (1000 UNIT) tablet Commonly known as:  VITAMIN D3 Take 1,000 Units by mouth daily.   cloNIDine 0.1 MG tablet Commonly known as: CATAPRES Take 1 tablet (0.1 mg total) by mouth 3 (three) times daily.   cyanocobalamin 1000 MCG tablet Take 1 tablet (1,000 mcg total) by mouth daily.   folic acid 1 MG tablet Commonly known as: FOLVITE Take 1 tablet (1 mg total) by mouth daily.   ipratropium-albuterol 0.5-2.5 (3) MG/3ML Soln Commonly known as: DUONEB Take 3 mLs by nebulization 3 (three) times daily.   multivitamin with minerals Tabs tablet Take 1 tablet by mouth daily.   oxyCODONE 5 MG immediate release tablet Commonly known as: Oxy IR/ROXICODONE Take 1 tablet (5 mg total) by mouth every 6 (six) hours as needed for severe pain.   pantoprazole 40 MG tablet Commonly known as: Protonix Take 1 tablet (40 mg total) by mouth daily.   tamsulosin 0.4 MG Caps capsule Commonly known as: FLOMAX Take 1 capsule (0.4 mg total) by mouth daily after supper. Start taking on: November 02, 2021   thiamine 100 MG tablet Take 1 tablet (100 mg total) by mouth daily.        Contact information for after-discharge care     Destination     [facility] .   Service: Skilled Nursing Contact information: [address] [place] [address] [phone]                    Discharge Exam: Ceasar Mons Weights   10/28/21 0500 10/29/21 0500 10/30/21 0454  Weight: 67.8 kg 65.6 kg 63.5 kg   General exam: Alert, awake, oriented x 3; following commands appropriately; not demonstrating withdrawal symptoms at this point.  Generally weak and chronically ill in appearance.  2 L nasal cannula supplementation in place with good saturation.  Continues to have intermittent productive coughing spells. Respiratory system: Positive rhonchi bilaterally; no using accessory muscles. Cardiovascular system:RRR.  No rubs or gallops; no JVD. Gastrointestinal system: Abdomen is nondistended, soft and nontender. No  organomegaly or masses felt. Normal bowel sounds heard. Central nervous system: Alert and oriented. No focal neurological deficits. Extremities: No cyanosis or clubbing. Skin: No petechiae. Psychiatry: Judgement and insight appear normal. Mood & affect appropriate.   Condition at discharge: Stable and improved.  The results of significant diagnostics from this hospitalization (including imaging, microbiology, ancillary and laboratory) are listed below for reference.   Imaging Studies: DG Swallowing Func-Speech Pathology  Result Date: 11/01/2021 Table formatting from the original result was not included. Objective Swallowing Evaluation: Type of Study: MBS-Modified Barium Swallow Study  Patient Details Name: Larry [name] MRN: [id number] Date of Birth: [birth year] Today's Date: 11/01/2021 Time: SLP Start Time (ACUTE ONLY): 1135 -SLP Stop Time (ACUTE ONLY): 1158 SLP Time Calculation (min) (ACUTE ONLY): 23 min Past Medical History: Past Medical History: Diagnosis Date  Alcohol abuse   Arthritis   COPD (chronic obstructive pulmonary disease) (HCC)   black lung  Hypertension   Shingles   Shortness of breath dyspnea   SOB with exertion  Thyroid disease   hypothyroidism Past Surgical History: Past Surgical  History: Procedure Laterality Date  CATARACT EXTRACTION W/ INTRAOCULAR LENS  IMPLANT, BILATERAL    KNEE SURGERY    ORIF HUMERUS FRACTURE Right 09/24/2014  Procedure: OPEN REDUCTION INTERNAL FIXATION (ORIF) PROXIMAL HUMERUS FRACTURE;  Surgeon: Jones Broom, MD;  Location: [facility] OR;  Service: Orthopedics;  Laterality: Right;  TONSILLECTOMY   HPI: 71 year old EtOH user, smoker BIBEMS on 6/22 for generalized weakness and multiple falls over the past several months.  He had pain in his mid to lower back .  His home was in a terrible condition, there were bedbugs.  He was severely malnourished initial imaging showed displaced rib fracture posterior left 10th, 11th and 12th ribs and left T2 transverse process fracture.   Initial labs were significant for hyponatremia.  Social work and APS referrals were made  On 6/28 he was noted to be confused, disoriented with tremors consistent with DTs.  CIWA protocol was started and he was transferred to stepdown.  He was noted to be febrile, coughing and dyspnea consistent with aspiration.  He required intubation and mechanical ventilation, hypotensive requiring fluid bolus and Levophed drip. Pt extubated this AM and BSE requested.  Subjective: "Thank you."  Recommendations for follow up therapy are one component of a multi-disciplinary discharge planning process, led by the attending physician.  Recommendations may be updated based on patient status, additional functional criteria and insurance authorization. Assessment / Plan / Recommendation   11/01/2021  12:00 PM Clinical Impressions Clinical Impression Pt presents with moderately severe sensorimotor oropharyngeal dysphagia characterized by penetration and aspiration of thin liquids and Nectar thick liquids. Oral stage is characterized by slow and uncoordinated bolus containment and manipulation, lingual pumping and premature spillage across consistencies. Prematurely spilled thin liquids and NTL enter the laryngeal vestibule after falling to the level of the pyiforms before the swallow and are not subsequently aspirated at varying volumes (trace to moderate amounts) during and after the swallow. Aspiration is inconsistently sensed and reflexive cough is somewhat effective in clearing some aspirates but not all. Note decreased base of tongue retraction, decreased epiglottic deflection, decreased pharyngeal excursion and diminished pharyngeal stripping wave. Chin tuck was not effective in decreasing/minimizing penetration/aspiration, however effortful swallow with NTL was effective in minimizing penetration/aspiration. Barium tablet administered with HTL with brief stasis in the valleculae that easily passed through with a puree  presentation.  Recommend continue with D2/fine chop diet and HONEY thick liquids; recommend crush large pills and small pills are ok whole with puree. Recommend strategies, multiple dry swallows and coughing/throat clears throughout meal. Recommend continue skilled ST with trials of NTL with effortful swallows. Suspect Pt will need repeat instrumental assessment when clinically appropriate. Thank you, SLP Visit Diagnosis Dysphagia, unspecified (R13.10);Dysphagia, oropharyngeal phase (R13.12) Impact on safety and function Moderate aspiration risk;Risk for inadequate nutrition/hydration     11/01/2021  12:00 PM Treatment Recommendations Treatment Recommendations Therapy as outlined in treatment plan below     11/01/2021  12:00 PM Prognosis Prognosis for Safe Diet Advancement Good Barriers to Reach Goals Severity of deficits   11/01/2021  12:00 PM Diet Recommendations SLP Diet Recommendations Dysphagia 2 (Fine chop) solids;Honey thick liquids Liquid Administration via Cup Medication Administration Crushed with puree Compensations Slow rate;Small sips/bites;Multiple dry swallows after each bite/sip;Clear throat intermittently     11/01/2021  12:00 PM Other Recommendations Oral Care Recommendations Oral care BID Follow Up Recommendations Skilled nursing-short term rehab (<3 hours/day) Assistance recommended at discharge Frequent or constant Supervision/Assistance Functional Status Assessment Patient has had a recent decline in their functional status  and demonstrates the ability to make significant improvements in function in a reasonable and predictable amount of time.   11/01/2021  12:00 PM Frequency and Duration  Speech Therapy Frequency (ACUTE ONLY) min 2x/week Treatment Duration 1 week     11/01/2021  12:00 PM Oral Phase Oral Phase Impaired Oral - Honey Teaspoon NT Oral - Honey Cup Lingual pumping;Reduced posterior propulsion;Piecemeal swallowing;Lingual/palatal residue;Delayed oral transit;Decreased bolus  cohesion;Premature spillage Oral - Nectar Teaspoon NT Oral - Nectar Cup Lingual pumping;Reduced posterior propulsion;Piecemeal swallowing;Lingual/palatal residue;Delayed oral transit;Decreased bolus cohesion;Premature spillage Oral - Nectar Straw NT Oral - Thin Teaspoon Lingual pumping;Reduced posterior propulsion;Piecemeal swallowing;Lingual/palatal residue;Delayed oral transit;Decreased bolus cohesion;Premature spillage Oral - Thin Cup Lingual pumping;Reduced posterior propulsion;Piecemeal swallowing;Lingual/palatal residue;Delayed oral transit;Decreased bolus cohesion;Premature spillage Oral - Thin Straw NT Oral - Puree Reduced posterior propulsion;Piecemeal swallowing;Delayed oral transit;Decreased bolus cohesion Oral - Mech Soft NT Oral - Regular Reduced posterior propulsion;Piecemeal swallowing;Delayed oral transit;Decreased bolus cohesion Oral - Multi-Consistency NT Oral - Pill [facility]    11/01/2021  12:00 PM Pharyngeal Phase Pharyngeal Phase Impaired Pharyngeal- Honey Teaspoon NT Pharyngeal- Honey Cup Reduced epiglottic inversion;Reduced pharyngeal peristalsis;Reduced tongue base retraction;Pharyngeal residue - valleculae;Pharyngeal residue - pyriform;Lateral channel residue;Delayed swallow initiation-vallecula Pharyngeal- Nectar Teaspoon NT Pharyngeal- Nectar Cup Reduced epiglottic inversion;Reduced pharyngeal peristalsis;Delayed swallow initiation-pyriform sinuses;Pharyngeal residue - valleculae;Pharyngeal residue - pyriform;Lateral channel residue;Reduced airway/laryngeal closure;Reduced tongue base retraction;Penetration/Aspiration before swallow;Penetration/Aspiration during swallow;Penetration/Apiration after swallow;Trace aspiration Pharyngeal Material enters airway, passes BELOW cords without attempt by patient to eject out (silent aspiration);Material enters airway, passes BELOW cords and not ejected out despite cough attempt by patient Pharyngeal- Nectar Straw NT Pharyngeal- Thin Teaspoon Delayed  swallow initiation-pyriform sinuses;Reduced pharyngeal peristalsis;Reduced anterior laryngeal mobility;Reduced epiglottic inversion;Reduced laryngeal elevation;Reduced airway/laryngeal closure;Reduced tongue base retraction;Penetration/Aspiration before swallow;Penetration/Aspiration during swallow;Penetration/Apiration after swallow;Trace aspiration;Pharyngeal residue - valleculae;Pharyngeal residue - pyriform;Lateral channel residue Pharyngeal Material enters airway, passes BELOW cords without attempt by patient to eject out (silent aspiration);Material enters airway, passes BELOW cords and not ejected out despite cough attempt by patient Pharyngeal- Thin Cup Delayed swallow initiation-pyriform sinuses;Reduced pharyngeal peristalsis;Reduced anterior laryngeal mobility;Reduced epiglottic inversion;Reduced laryngeal elevation;Reduced airway/laryngeal closure;Reduced tongue base retraction;Penetration/Aspiration before swallow;Penetration/Aspiration during swallow;Penetration/Apiration after swallow;Pharyngeal residue - valleculae;Pharyngeal residue - pyriform;Lateral channel residue;Moderate aspiration Pharyngeal Material enters airway, passes BELOW cords without attempt by patient to eject out (silent aspiration);Material enters airway, passes BELOW cords and not ejected out despite cough attempt by patient Pharyngeal- Thin Straw NT Pharyngeal- Puree Delayed swallow initiation-vallecula;Reduced epiglottic inversion;Reduced laryngeal elevation;Pharyngeal residue - valleculae;Pharyngeal residue - pyriform;Reduced tongue base retraction;Reduced pharyngeal peristalsis Pharyngeal- Mechanical Soft NT Pharyngeal- Regular Delayed swallow initiation-vallecula;Reduced epiglottic inversion;Reduced laryngeal elevation;Pharyngeal residue - valleculae;Pharyngeal residue - pyriform;Reduced tongue base retraction;Reduced pharyngeal peristalsis Pharyngeal- Multi-consistency NT    11/01/2021  12:00 PM Cervical Esophageal Phase   Cervical Esophageal Phase WFL Amelia H. Romie Levee, CCC-SLP Speech Language Pathologist Georgetta Haber 11/01/2021, 12:28 PM                     DG CHEST PORT 1 VIEW  Result Date: 10/26/2021 CLINICAL DATA:  Weakness EXAM: PORTABLE CHEST 1 VIEW COMPARISON:  Chest x-ray dated September 24, 2021 FINDINGS: Cardiac and mediastinal contours are unchanged. Numerous bilateral calcified granulomas. Unchanged retrocardiac opacity. No new focal consolidation. No large pleural effusion or pneumothorax. IMPRESSION: Unchanged retrocardiac opacity, likely due to atelectasis. Electronically Signed   By: Allegra Lai M.D.   On: 10/26/2021 08:08   DG CHEST PORT 1 VIEW  Result Date: 10/24/2021 CLINICAL DATA:  72 year old male with pneumoconiosis, respiratory failure,  intubated. EXAM: PORTABLE CHEST 1 VIEW COMPARISON:  Portable chest 10/23/2021 and earlier. FINDINGS: Portable AP semi upright view at 0512 hours. Mildly rotated to the right today. Endotracheal tube tip in good position between the clavicles and carina. Enteric tube courses to the abdomen, tip not included. Stable lung volumes and mediastinal contours. Extensive and frequently calcified bilateral nodular lung disease. Mild veiling opacity at both lung bases with small volume pleural fluid demonstrated by CT Abdomen and Pelvis last month. Stable ventilation. No pneumothorax. Stable visualized osseous structures. IMPRESSION: 1.  Stable lines and tubes. 2. Stable ventilation with probable small bilateral pleural effusions superimposed on chronic lung disease. Electronically Signed   By: Odessa Fleming M.D.   On: 10/24/2021 08:24   DG CHEST PORT 1 VIEW  Result Date: 10/23/2021 CLINICAL DATA:  Dyspnea. History of hypertension and chronic obstructive pulmonary disease. EXAM: PORTABLE CHEST 1 VIEW COMPARISON:  Multiple prior radiographs, most recently 10/22/2021 and 10/21/2021. Chest CT 08/30/2020. FINDINGS: 0801 hours. The endotracheal and enteric tubes are unchanged in  position. The heart size and mediastinal contours are stable. Left lower lobe airspace disease appears slightly increased from yesterday, although improved from 10/20/2021. The lungs are otherwise clear aside from numerous calcified granulomas. No evidence of pneumothorax or significant pleural effusion. 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 IMPRESSION: Slightly increased retrocardiac opacity since yesterday. Otherwise stable chest. Electronically Signed   By: Carey Bullocks M.D.   On: 10/23/2021 10:37   DG CHEST PORT 1 VIEW  Result Date: 10/22/2021 CLINICAL DATA:  Respiratory distress.  Intubated patient. EXAM: PORTABLE CHEST 1 VIEW COMPARISON:  Radiographs 10/21/2021 and 10/20/2021.  CT 08/30/2020. FINDINGS: 0633 hours. Two views obtained. The endotracheal and enteric tubes are unchanged in position. The heart size and mediastinal contours are stable with aortic atherosclerosis. Mild residual airspace disease medially at the left lung base is unchanged from yesterday. Numerous calcified granulomas are present in both lungs. There is no pleural effusion or pneumothorax. No acute osseous findings are seen. Posttraumatic and post surgical deformities of the right shoulder are noted. IMPRESSION: No significant change from yesterday in residual left basilar airspace disease. Stable support system. Electronically Signed   By: Carey Bullocks M.D.   On: 10/22/2021 09:07   DG CHEST PORT 1 VIEW  Result Date: 10/21/2021 CLINICAL DATA:  Dysarthria/copd/htn/smoker EXAM: PORTABLE CHEST - 1 VIEW COMPARISON:  10/20/2021 FINDINGS: Endotracheal tube and gastric tube stable position. Improved aeration of the left lung base with some residual linear scarring or atelectasis. Scattered calcified granulomas bilaterally are again noted. Heart size and mediastinal contours are within normal limits. No effusion. Orthopedic hardware in the proximal right humerus. IMPRESSION: Improving left lower lung aeration. Stable  support hardware Electronically Signed   By: Corlis Leak M.D.   On: 10/21/2021 07:57   DG CHEST PORT 1 VIEW  Result Date: 10/20/2021 CLINICAL DATA:  Shortness of breath COPD EXAM: PORTABLE CHEST 1 VIEW COMPARISON:  Previous studies including the examination of 10/19/2021 FINDINGS: Transverse diameter of heart is slightly increased. There is dense infiltrate in the medial left lower lung fields obscuring left hemidiaphragm. There is blunting of left lateral CP angle suggesting small effusion. Low position of diaphragms suggests COPD. There are numerous scattered calcifications in both lungs suggesting healed granulomas. Tip of endotracheal tube is 4.1 cm above the carina. Enteric tube is noted traversing the esophagus. There is dislocation in the right shoulder. IMPRESSION: Infiltrates in the left lower lung fields suggest atelectasis/pneumonia. Small left pleural effusion. COPD.  No significant interval changes are noted. Electronically Signed   By: Ernie Avena M.D.   On: 10/20/2021 08:19   DG Chest 1 View  Result Date: 10/19/2021 CLINICAL DATA:  Intubation and NG placement. Multiple left rib fractures. EXAM: CHEST  1 VIEW COMPARISON:  10/19/2021 FINDINGS: Interval placement of an endotracheal tube with tip measuring 6 cm above the carina. An enteric tube has been placed with tip off the field of view but below the left hemidiaphragm. Minimally displaced left lower rib fractures are identified. Old appearing fracture deformities on the right ribs. No visualized pneumothorax. Postoperative changes in the right shoulder. Anterior dislocation of the right shoulder was better seen on prior study from earlier today. This is a chronic finding and is been present on multiple prior studies dating back to 08/29/2020. Multiple calcified granulomas are demonstrated throughout the lungs. Probable atelectasis in the left lung base behind the heart. No pleural effusions. No focal consolidation. IMPRESSION:  Appliances appear in satisfactory position. Otherwise no change since previous study today. Electronically Signed   By: Burman Nieves M.D.   On: 10/19/2021 21:27   DG CHEST PORT 1 VIEW  Result Date: 10/19/2021 CLINICAL DATA:  Pain and weakness. EXAM: PORTABLE CHEST 1 VIEW COMPARISON:  10/16/2021. FINDINGS: 5:03 a.m., 10/19/2021.  The patient is rotated to the left. Innumerable calcified lung granulomas are again noted and scattered calcified pleural plaquing. The lungs are emphysematous. Patchy hazy increased opacity is noted today in the right lung base and increased denser consolidation/atelectasis in the left lower lobe with development of small bilateral pleural effusions. No other new lung opacity is seen. The cardiac size is normal. There is aortic atherosclerosis with stable mediastinum. Osteopenia. 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 IMPRESSION: Increased opacities in the left-greater-than-right lung bases and small pleural effusions. Findings concerning for pneumonia or aspiration. Background chronic change described above. Electronically Signed   By: Almira Bar M.D.   On: 10/19/2021 07:11   DG CHEST PORT 1 VIEW  Result Date: 10/16/2021 CLINICAL DATA:  Generalized weakness, shortness of breath, former smoker EXAM: PORTABLE CHEST 1 VIEW COMPARISON:  10/13/2021 FINDINGS: The heart size and mediastinal contours are within normal limits. Emphysema. Numerous calcified nodules and pleural plaques throughout chest, unchanged. No acute airspace opacity. Chronic right glenohumeral dislocation plate and screw fixation of the proximal right humerus. IMPRESSION: 1. Emphysema. No acute abnormality of the lungs. 2. Numerous calcified nodules and pleural plaques throughout chest, unchanged. Electronically Signed   By: Jearld Lesch M.D.   On: 10/16/2021 15:04   CT L-SPINE NO CHARGE  Result Date: 10/13/2021 CLINICAL DATA:  Lumbar  radiculopathy, trauma; generalized weakness and multiple falls EXAM: CT Lumbar Spine without contrast TECHNIQUE: Technique: Multiplanar CT images of the lumbar spine were reconstructed from contemporary CT of the Abdomen and Pelvis. RADIATION DOSE REDUCTION: This exam was performed according to the departmental dose-optimization program which includes automated exposure control, adjustment of the mA and/or kV according to patient size and/or use of iterative reconstruction technique. CONTRAST:  None COMPARISON:  None Available. FINDINGS: Segmentation: 5 lumbar type vertebrae assuming rudimentary ribs at T12. Alignment: No significant listhesis. Vertebrae: Decreased osseous mineralization. Chronic appearing compression fracture of L1 with loss of height at superior and inferior endplates resulting in moderate loss of height. Otherwise multilevel degenerative plate irregularity and minor loss of vertebral body height. Acute nondisplaced fracture of the left L2 transverse process. Paraspinal and other soft tissues: Acute fracture  of the posterior left eleventh rib. Extra-spinal findings are better evaluated on concurrent dedicated source imaging. Disc levels: Multilevel degenerative changes with disc space narrowing, disc bulges, endplate osteophytes, facet hypertrophy, and ligamentum flavum thickening. Canal and subarticular recess narrowing is greatest at L4-L5. Foraminal narrowing is greatest on the left at L4-L5. IMPRESSION: Nondisplaced acute fracture of the left L2 transverse process. Acute fracture of the posterior left eleventh rib. Chronic L1 compression fracture. Electronically Signed   By: Guadlupe Spanish M.D.   On: 10/13/2021 10:50   CT Renal Stone Study  Result Date: 10/13/2021 CLINICAL DATA:  Flank pain, suspected kidney stone in a 70 year old male. EXAM: CT ABDOMEN AND PELVIS WITHOUT CONTRAST TECHNIQUE: Multidetector CT imaging of the abdomen and pelvis was performed following the standard protocol  without IV contrast. RADIATION DOSE REDUCTION: This exam was performed according to the departmental dose-optimization program which includes automated exposure control, adjustment of the mA and/or kV according to patient size and/or use of iterative reconstruction technique. COMPARISON:  None available FINDINGS: Lower chest: Granulomatous changes throughout the chest. Basilar atelectasis. Trace LEFT-sided effusion. Marked pulmonary emphysema. Heart with normal size. Three-vessel coronary artery calcification. No pericardial effusion or nodularity. Heart is incompletely imaged. Hepatobiliary: Severe hepatic steatosis. No visible, focal suspicious hepatic lesion. No pericholecystic stranding. No gross biliary duct distension. Pancreas: Normal contour without signs of inflammation. Spleen: Low-density lesion in the spleen is unchanged dating back to May of 2022 at 1.7 cm, imaged on the chest CT that was performed in 2022. This displays near water density and is likely a benign cyst or splenic hemangioma. Adrenals/Urinary Tract: Adrenal glands are normal. Smooth renal contours. No hydronephrosis. No nephrolithiasis. No perivesical stranding. Stomach/Bowel: Small hiatal hernia. Mild gaseous distension of small bowel without substantial dilation or adjacent stranding. Appendix not visible, no secondary signs to suggest acute appendicitis. Colonic diverticulosis without current evidence of diverticulitis. Diverticular changes are worse in the sigmoid colon. Vascular/Lymphatic: Aortic atherosclerosis. No sign of aneurysm. Smooth contour of the IVC. There is no gastrohepatic or hepatoduodenal ligament lymphadenopathy. No retroperitoneal or mesenteric lymphadenopathy. No pelvic sidewall lymphadenopathy. Marked atherosclerotic changes of the abdominal aorta. Assessment limited by lack of intravenous contrast. Reproductive: Unremarkable by CT. Other: No pneumoperitoneum or ascites Musculoskeletal: Osteopenia. And spinal  degenerative changes. Loss of height in the L1 level approximately 40-50% loss of height along the inferior endplate more anteriorly is unchanged based on comparison with 2019 imaging. Acute rib fractures of LEFT-sided posterior ribs 11 and 12 with displacement. Also LEFT tenth rib with mildly displaced fracture these are seen posterolateral E and may explain the small LEFT-sided effusion. This effusion does show low-density. Subacute fracture of RIGHT twelfth rib and healed fractures of RIGHT posterolateral ribs and LEFT lateral ribs. IMPRESSION: 1. Completely displaced fractures of posterior LEFT eleventh and twelfth ribs with mildly displaced fracture of LEFT tenth rib, associated with small LEFT-sided effusion. 2. Subacute fractures of RIGHT twelfth rib and other chronic appearing healed rib fractures compatible with multiple rib fractures of varying ages. Correlate with any history of recent and or remote trauma. 3. No nephrolithiasis or hydronephrosis. 4. Severe hepatic steatosis. 5. Colonic diverticulosis without current evidence of diverticulitis. 6. Small hiatal hernia. 7. Three-vessel coronary artery calcification. 8. Marked atherosclerotic changes of the abdominal aorta. Aortic Atherosclerosis (ICD10-I70.0) and Emphysema (ICD10-J43.9). Electronically Signed   By: Donzetta Kohut M.D.   On: 10/13/2021 10:33   CT Head Wo Contrast  Result Date: 10/13/2021 CLINICAL DATA:  Head trauma EXAM: CT HEAD WITHOUT  CONTRAST TECHNIQUE: Contiguous axial images were obtained from the base of the skull through the vertex without intravenous contrast. RADIATION DOSE REDUCTION: This exam was performed according to the departmental dose-optimization program which includes automated exposure control, adjustment of the mA and/or kV according to patient size and/or use of iterative reconstruction technique. COMPARISON:  Head CT dated Aug 30, 2014 FINDINGS: Brain: Chronic white matter ischemic change. No evidence of acute  infarction, hemorrhage, hydrocephalus, extra-axial collection or mass lesion/mass effect. Vascular: No hyperdense vessel or unexpected calcification. Skull: Normal. Negative for fracture or focal lesion. Sinuses/Orbits: No acute finding. Other: None. IMPRESSION: No acute intracranial abnormality. Electronically Signed   By: Allegra Lai M.D.   On: 10/13/2021 10:21   DG Lumbar Spine Complete  Result Date: 10/13/2021 CLINICAL DATA:  back pain fall EXAM: LUMBAR SPINE - COMPLETE 4+ VIEW COMPARISON:  CT 08/30/2020 FINDINGS: Unchanged chronic compression deformity of L1 and mild anterior wedging of T12. There is no evidence of fracture involving L2 through L5. There is multilevel degenerative disc disease, severe at L4-L5 with trace, grade 1 anterolisthesis at this level. There is mild to moderate lower lumbar predominant facet arthropathy. IMPRESSION: Unchanged chronic compression deformity of L1 and mild anterior wedging of T12. No evidence of acute lumbar spine fracture. Multilevel degenerative disc disease, severe at L4-L5. Mild to moderate lower lumbar predominant facet arthropathy. Electronically Signed   By: Caprice Renshaw M.D.   On: 10/13/2021 08:39   DG Chest Port 1 View  Result Date: 10/13/2021 CLINICAL DATA:  Fall.  Cough.  Back pain. EXAM: PORTABLE CHEST 1 VIEW COMPARISON:  Two-view chest x-ray 08/29/2020 FINDINGS: Heart size normal. Atherosclerotic calcifications are present at the aortic arch. Extensive granulomatous disease is again noted. No superimposed airspace disease is present. No edema or effusion is present. Remote right-sided rib fractures are noted. Postoperative changes again noted in the right humerus. Chronic inferior dislocation of the right shoulder noted. IMPRESSION: 1. No acute cardiopulmonary disease or significant interval change. 2. Extensive granulomatous disease. 3. Remote right-sided rib fractures. 4. Chronic right shoulder dislocation Electronically Signed   By: Marin Roberts M.D.   On: 10/13/2021 08:19    Microbiology: Results for orders placed or performed during the hospital encounter of 10/13/21  SARS Coronavirus 2 by RT PCR (hospital order, performed in [facility] hospital lab) *cepheid single result test* Anterior Nasal Swab     Status: None   Collection Time: 10/13/21  7:13 AM   Specimen: Anterior Nasal Swab  Result Value Ref Range Status   SARS Coronavirus 2 by RT PCR NEGATIVE NEGATIVE Final    Comment: (NOTE) SARS-CoV-2 target nucleic acids are NOT DETECTED.  The SARS-CoV-2 RNA is generally detectable in upper and lower respiratory specimens during the acute phase of infection. The lowest concentration of SARS-CoV-2 viral copies this assay can detect is 250 copies / mL. A negative result does not preclude SARS-CoV-2 infection and should not be used as the sole basis for treatment or other patient management decisions.  A negative result may occur with improper specimen collection / handling, submission of specimen other than nasopharyngeal swab, presence of viral mutation(s) within the areas targeted by this assay, and inadequate number of viral copies (<250 copies / mL). A negative result must be combined with clinical observations, patient history, and epidemiological information.  Fact Sheet for Patients:   RoadLapTop.co.za  Fact Sheet for Healthcare Providers: http://kim-miller.com/  This test is not yet approved or  cleared by the [place] FDA  and has been authorized for detection and/or diagnosis of SARS-CoV-2 by FDA under an Emergency Use Authorization (EUA).  This EUA will remain in effect (meaning this test can be used) for the duration of the COVID-19 declaration under Section 564(b)(1) of the Act, 21 U.S.C. section 360bbb-3(b)(1), unless the authorization is terminated or revoked sooner.  Performed at [facility], [address], [place], [place] [address]   Culture,  Respiratory w Gram Stain     Status: None   Collection Time: 10/20/21  3:00 PM   Specimen: Tracheal Aspirate; Respiratory  Result Value Ref Range Status   Specimen Description   Final    TRACHEAL ASPIRATE Performed at [facility], [address] [place], [place] [address]    Special Requests   Final    NONE Performed at [facility], [address] [place], [place] [address]    Gram Stain   Final    NO SQUAMOUS EPITHELIAL CELLS SEEN FEW WBC SEEN FEW GRAM POSITIVE COCCI    Culture   Final    MODERATE Normal respiratory flora-no Staph aureus or Pseudomonas seen Performed at [facility], [address] [place], [place] [address]    Report Status 10/23/2021 FINAL  Final    Labs: CBC: Recent Labs  Lab 10/26/21 0411  WBC 4.9  HGB 8.6*  HCT 27.5*  MCV 105.0*  PLT 239   Basic Metabolic Panel: Recent Labs  Lab 10/26/21 0411  NA 137  K 3.8  CL 103  CO2 27  GLUCOSE 120*  BUN 16  CREATININE 0.46*  CALCIUM 8.0*   Liver Function Tests: No results for input(s): "AST", "ALT", "ALKPHOS", "BILITOT", "PROT", "ALBUMIN" in the last 168 hours. CBG: Recent Labs  Lab 11/01/21 0010 11/01/21 0405 11/01/21 0737 11/01/21 1111 11/01/21 1617  GLUCAP 118* 105* 97 106* 100*    Discharge time spent: greater than 30 minutes.  Signed: Vassie Loll, MD [facility] 11/01/2021

## 2021-11-01 NOTE — Assessment & Plan Note (Addendum)
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 -Continue bronchodilator management -Continue to wean oxygen supplementation as tolerated.

## 2021-11-01 NOTE — Evaluation (Signed)
Modified Barium Swallow Progress Note  Patient Details  Name: Larry [name] MRN: [id number] Date of Birth: [birth year]  Today's Date: 11/01/2021  Modified Barium Swallow completed.  Full report located under Chart Review in the Imaging Section.  Brief recommendations include the following:  Clinical Impression  Pt presents with moderately severe sensorimotor oropharyngeal dysphagia characterized by penetration and aspiration of thin liquids and Nectar thick liquids. Oral stage is characterized by slow and uncoordinated bolus containment and manipulation, lingual pumping and premature spillage across consistencies. Prematurely spilled thin liquids and NTL enter the laryngeal vestibule after falling to the level of the pyiforms before the swallow and are not subsequently aspirated at varying volumes (trace to moderate amounts) during and after the swallow. Aspiration is inconsistently sensed and reflexive cough is somewhat effective in clearing some aspirates but not all. Note decreased base of tongue retraction, decreased epiglottic deflection, decreased pharyngeal excursion and diminished pharyngeal stripping wave. Chin tuck was not effective in decreasing/minimizing penetration/aspiration, however effortful swallow with NTL was effective in minimizing penetration/aspiration. Barium tablet administered with HTL with brief stasis in the valleculae that easily passed through with a puree presentation.  Recommend continue with D2/fine chop diet and HONEY thick liquids; recommend crush large pills and small pills are ok whole with puree. Recommend strategies, multiple dry swallows and coughing/throat clears throughout meal. Recommend continue skilled ST with trials of NTL with effortful swallows. Suspect Pt will need repeat instrumental assessment when clinically appropriate. Thank you,   Swallow Evaluation Recommendations       SLP Diet Recommendations: Dysphagia 2 (Fine chop) solids;Honey thick  liquids   Liquid Administration via: Cup   Medication Administration: Crushed with puree (small pills are ok whole with puree)   Supervision: Patient able to self feed;Full supervision/cueing for compensatory strategies   Compensations: Slow rate;Small sips/bites;Multiple dry swallows after each bite/sip;Clear throat intermittently       Oral Care Recommendations: Oral care BID      Serafina Topham H. Romie Levee, CCC-SLP Speech Language Pathologist   Georgetta Haber 11/01/2021,12:27 PM

## 2021-11-01 NOTE — Assessment & Plan Note (Signed)
-   With symptoms of urinary retention -Ended initially requiring Foley catheter; with further improvement after Flomax initiated and successful voiding trial/Foley catheter removal prior to discharge.

## 2021-11-01 NOTE — Progress Notes (Signed)
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  He admits to drinking one half of 1/5 of whiskey daily.  He is mostly complaining of pain to his mid and lower back.  His home was noted to be in terrible condition and he was covered and bedbugs and all of his clothing had to be removed and he had to be bathed in the ED.  He was sent for work-up in the ED including CT scans and x-rays and noted to have displaced rib fractures in the posterior left 11th and 12th ribs and mildly displaced left 10th rib fracture and severe hepatic steatosis and left L2 transverse process acute fracture.  He is notably in severe pain and noted to be hyponatremic as well.  Admission was requested for pain management and possible placement.  He has severe problems with frequent falling likely related to alcohol intoxication    PT Comments    Patient presents supine in bed on 2 LPM. Patient placed on room air to simulate home environment during session with vitals monitored. Patient consents to PT treatment session. Patient is min/mod assist with bed mobility. Improved rolling ability more efficient technical form and ability to partially push up from R UE. Needs trunk assist to reach upright position. Decreased trunk control with scooting to EOB. Patient is mod assist with transfers and use of RW. Able to initiate sit to stand but needs assist to reach upright position. Significant forward trunk lean impacting base of support and decreasing overall  balancing ability. Requires trunk stabilization provided by PT. Struggles with coordination due to generalized weakness and fatigue. Limited to a few steps in room with mod/max assist and use of RW during ambulation. Requires tactile and verbal cues of RW and feet placement. Demonstrates slow labored steps with difficulty in weight shifting requiring extended time. Patient able to tolerate therapeutic exercises but is limited by fatigue and deconditioning. Vitals assessed throughout session with patient averaging 95% SpO2 on room air. Patient tolerated sitting up in chair after therapy with nursing staff notified of mobility status. Patient will benefit from continued skilled physical therapy in hospital and recommended venue below to increase strength, balance, endurance for safe ADLs and gait.    Recommendations for follow up therapy are one component of a multi-disciplinary discharge planning process, led by the attending physician.  Recommendations may be updated based on patient status, additional functional criteria and insurance authorization.  Follow Up Recommendations  Skilled nursing-short term rehab (<3 hours/day) Can patient physically be transported by private vehicle: No   Assistance Recommended at Discharge Intermittent Supervision/Assistance  Patient can return home with the following A lot of help with bathing/dressing/bathroom;A lot of help with walking and/or transfers;Help with stairs or ramp for entrance;Assistance with cooking/housework   Equipment Recommendations  None recommended by PT    Recommendations for Other Services       Precautions / Restrictions Precautions Precautions: Fall Restrictions Weight Bearing Restrictions: No     Mobility  Bed Mobility Overal bed mobility: Needs Assistance Bed Mobility: Rolling, Sidelying to Sit Rolling: Min assist, Mod assist Sidelying  to sit: Min assist, Mod assist       General bed mobility comments: Min/mod assist with  bed mobility. Improved rolling ability with techincal from and able to partially push up from R UE. Needs trunk assist to reach upright position. Decreased trunk control with scooting to EOB    Transfers Overall transfer level: Needs assistance Equipment used: Rolling walker (2 wheels) Transfers: Sit to/from Stand, Bed to chair/wheelchair/BSC Sit to Stand: Mod assist   Step pivot transfers: Mod assist       General transfer comment: Able to initiate STS but needs assist to reach upright position with RW. Mod assist with transfers. Significant forward trunk lean impacting BOS and overall balancing ability. Requires trunk stabilization provide by PT. Poor balance and struggles with coordination.    Ambulation/Gait Ambulation/Gait assistance: Mod assist, Max assist Gait Distance (Feet): 5 Feet Assistive device: Rolling walker (2 wheels) Gait Pattern/deviations: Decreased step length - right, Decreased step length - left, Decreased stride length, Trunk flexed Gait velocity: decreased     General Gait Details: Limited to a few steps in room with mod/max assist. Slow labored side steps with difficulty in weight shifting. Requires tactile and verbal cues of RW placement and feet placement   Stairs             Wheelchair Mobility    Modified Rankin (Stroke Patients Only)       Balance Overall balance assessment: Needs assistance Sitting-balance support: Feet supported, No upper extremity supported Sitting balance-Leahy Scale: Fair Sitting balance - Comments: seated at EOB   Standing balance support: During functional activity, Bilateral upper extremity supported, Reliant on assistive device for balance Standing balance-Leahy Scale: Poor Standing balance comment: using RW                            Cognition Arousal/Alertness: Awake/alert Behavior During Therapy: WFL for tasks assessed/performed Overall Cognitive Status: Within Functional Limits for tasks  assessed                                          Exercises General Exercises - Lower Extremity Long Arc Quad: Seated, AAROM, Strengthening, Both, 10 reps Hip Flexion/Marching: Seated, AAROM, Strengthening, Both, 10 reps Toe Raises: AROM, 20 reps, Strengthening, Seated, Both Heel Raises: AROM, 20 reps, Strengthening, Seated, Both    General Comments        Pertinent Vitals/Pain Pain Assessment Pain Assessment: No/denies pain    Home Living                          Prior Function            PT Goals (current goals can now be found in the care plan section) Acute Rehab PT Goals Patient Stated Goal: return home PT Goal Formulation: With patient Time For Goal Achievement: 11/10/21 Potential to Achieve Goals: Good Progress towards PT goals: Progressing toward goals    Frequency    Min 3X/week      PT Plan Current plan remains appropriate    Co-evaluation              AM-PAC PT "6 Clicks" Mobility   Outcome Measure  Help needed turning from your back to your side while in a flat bed without using bedrails?: A Lot Help needed moving from lying on  your back to sitting on the side of a flat bed without using bedrails?: A Lot Help needed moving to and from a bed to a chair (including a wheelchair)?: A Lot Help needed standing up from a chair using your arms (e.g., wheelchair or bedside chair)?: A Lot Help needed to walk in hospital room?: A Lot Help needed climbing 3-5 steps with a railing? : Total 6 Click Score: 11    End of Session Equipment Utilized During Treatment: Oxygen Activity Tolerance: Patient tolerated treatment well;Patient limited by fatigue Patient left: in chair;with call bell/phone within reach Nurse Communication: Mobility status PT Visit Diagnosis: Unsteadiness on feet (R26.81);Other abnormalities of gait and mobility (R26.89);Muscle weakness (generalized) (M62.81);History of falling (Z91.81)     Time:  1062-6948 PT Time Calculation (min) (ACUTE ONLY): 30 min  Charges:  $Therapeutic Exercise: 8-22 mins $Therapeutic Activity: 8-22 mins                     3:19 PM, 11/01/21 Arther Abbott, S/PT

## 2021-11-01 NOTE — Assessment & Plan Note (Signed)
-   In the setting of esophagitis probably from alcohol and intubation process -Seen by speech therapy with recommendations for dysphagia 2 and honey thick liquids. -Continue follow-up by speech therapy and physical therapy at a skilled nursing facility.

## 2021-11-02 DIAGNOSIS — S32020D Wedge compression fracture of second lumbar vertebra, subsequent encounter for fracture with routine healing: Secondary | ICD-10-CM | POA: Diagnosis not present

## 2021-11-02 DIAGNOSIS — E871 Hypo-osmolality and hyponatremia: Secondary | ICD-10-CM | POA: Diagnosis not present

## 2021-11-02 DIAGNOSIS — R2689 Other abnormalities of gait and mobility: Secondary | ICD-10-CM | POA: Diagnosis not present

## 2021-11-02 DIAGNOSIS — Z7401 Bed confinement status: Secondary | ICD-10-CM | POA: Diagnosis not present

## 2021-11-02 DIAGNOSIS — D539 Nutritional anemia, unspecified: Secondary | ICD-10-CM | POA: Diagnosis not present

## 2021-11-02 DIAGNOSIS — S2249XD Multiple fractures of ribs, unspecified side, subsequent encounter for fracture with routine healing: Secondary | ICD-10-CM | POA: Diagnosis not present

## 2021-11-02 DIAGNOSIS — R5381 Other malaise: Secondary | ICD-10-CM | POA: Diagnosis not present

## 2021-11-02 DIAGNOSIS — R1312 Dysphagia, oropharyngeal phase: Secondary | ICD-10-CM | POA: Diagnosis not present

## 2021-11-02 DIAGNOSIS — J449 Chronic obstructive pulmonary disease, unspecified: Secondary | ICD-10-CM | POA: Diagnosis not present

## 2021-11-02 DIAGNOSIS — F1020 Alcohol dependence, uncomplicated: Secondary | ICD-10-CM | POA: Diagnosis not present

## 2021-11-02 DIAGNOSIS — S2242XD Multiple fractures of ribs, left side, subsequent encounter for fracture with routine healing: Secondary | ICD-10-CM | POA: Diagnosis not present

## 2021-11-02 DIAGNOSIS — R1310 Dysphagia, unspecified: Secondary | ICD-10-CM | POA: Diagnosis not present

## 2021-11-02 DIAGNOSIS — N400 Enlarged prostate without lower urinary tract symptoms: Secondary | ICD-10-CM | POA: Diagnosis not present

## 2021-11-02 DIAGNOSIS — S32010D Wedge compression fracture of first lumbar vertebra, subsequent encounter for fracture with routine healing: Secondary | ICD-10-CM | POA: Diagnosis not present

## 2021-11-02 DIAGNOSIS — R296 Repeated falls: Secondary | ICD-10-CM | POA: Diagnosis not present

## 2021-11-02 DIAGNOSIS — K760 Fatty (change of) liver, not elsewhere classified: Secondary | ICD-10-CM | POA: Diagnosis not present

## 2021-11-02 DIAGNOSIS — R52 Pain, unspecified: Secondary | ICD-10-CM | POA: Diagnosis not present

## 2021-11-02 DIAGNOSIS — D649 Anemia, unspecified: Secondary | ICD-10-CM | POA: Diagnosis not present

## 2021-11-02 DIAGNOSIS — K2100 Gastro-esophageal reflux disease with esophagitis, without bleeding: Secondary | ICD-10-CM | POA: Diagnosis not present

## 2021-11-02 DIAGNOSIS — M6281 Muscle weakness (generalized): Secondary | ICD-10-CM | POA: Diagnosis not present

## 2021-11-02 DIAGNOSIS — R531 Weakness: Secondary | ICD-10-CM | POA: Diagnosis not present

## 2021-11-02 DIAGNOSIS — F1010 Alcohol abuse, uncomplicated: Secondary | ICD-10-CM | POA: Diagnosis not present

## 2021-11-02 DIAGNOSIS — S32028D Other fracture of second lumbar vertebra, subsequent encounter for fracture with routine healing: Secondary | ICD-10-CM | POA: Diagnosis not present

## 2021-11-02 DIAGNOSIS — R400 Somnolence: Secondary | ICD-10-CM | POA: Diagnosis not present

## 2021-11-02 LAB — GLUCOSE, CAPILLARY
Glucose-Capillary: 89 mg/dL (ref 70–99)
Glucose-Capillary: 89 mg/dL (ref 70–99)
Glucose-Capillary: 108 mg/dL — ABNORMAL HIGH (ref 70–99)

## 2021-11-02 NOTE — Progress Notes (Signed)
Patient seen and evaluated this a.m. with no acute overnight events noted and no specific complaints or concerns noted this morning.  Please refer to discharge summary dictated 7/11 for full details.  Total care time: 15 minutes.

## 2021-11-02 NOTE — Progress Notes (Signed)
Called report to nurse Kennyth Lose at [facility]. Pt is resting comfortably awaiting transport.

## 2021-11-02 NOTE — Progress Notes (Signed)
Called [name] Price with DSS and left message for call back regarding pt discharge to [facility].

## 2021-11-03 DIAGNOSIS — K2100 Gastro-esophageal reflux disease with esophagitis, without bleeding: Secondary | ICD-10-CM | POA: Diagnosis not present

## 2021-11-03 DIAGNOSIS — M6281 Muscle weakness (generalized): Secondary | ICD-10-CM | POA: Diagnosis not present

## 2021-11-03 DIAGNOSIS — R296 Repeated falls: Secondary | ICD-10-CM | POA: Diagnosis not present

## 2021-11-03 DIAGNOSIS — R52 Pain, unspecified: Secondary | ICD-10-CM | POA: Diagnosis not present

## 2021-11-03 DIAGNOSIS — F1020 Alcohol dependence, uncomplicated: Secondary | ICD-10-CM | POA: Diagnosis not present

## 2021-11-03 DIAGNOSIS — N400 Enlarged prostate without lower urinary tract symptoms: Secondary | ICD-10-CM | POA: Diagnosis not present

## 2021-11-03 DIAGNOSIS — R1310 Dysphagia, unspecified: Secondary | ICD-10-CM | POA: Diagnosis not present

## 2021-11-03 DIAGNOSIS — J449 Chronic obstructive pulmonary disease, unspecified: Secondary | ICD-10-CM | POA: Diagnosis not present

## 2021-11-07 DIAGNOSIS — F1020 Alcohol dependence, uncomplicated: Secondary | ICD-10-CM | POA: Diagnosis not present

## 2021-11-07 DIAGNOSIS — N400 Enlarged prostate without lower urinary tract symptoms: Secondary | ICD-10-CM | POA: Diagnosis not present

## 2021-11-07 DIAGNOSIS — R52 Pain, unspecified: Secondary | ICD-10-CM | POA: Diagnosis not present

## 2021-11-07 DIAGNOSIS — R1310 Dysphagia, unspecified: Secondary | ICD-10-CM | POA: Diagnosis not present

## 2021-11-07 DIAGNOSIS — J449 Chronic obstructive pulmonary disease, unspecified: Secondary | ICD-10-CM | POA: Diagnosis not present

## 2021-11-07 DIAGNOSIS — M6281 Muscle weakness (generalized): Secondary | ICD-10-CM | POA: Diagnosis not present

## 2021-11-07 DIAGNOSIS — R296 Repeated falls: Secondary | ICD-10-CM | POA: Diagnosis not present

## 2021-11-07 DIAGNOSIS — K2100 Gastro-esophageal reflux disease with esophagitis, without bleeding: Secondary | ICD-10-CM | POA: Diagnosis not present

## 2021-11-23 DIAGNOSIS — R296 Repeated falls: Secondary | ICD-10-CM | POA: Diagnosis not present

## 2021-11-23 DIAGNOSIS — R1310 Dysphagia, unspecified: Secondary | ICD-10-CM | POA: Diagnosis not present

## 2021-11-23 DIAGNOSIS — N400 Enlarged prostate without lower urinary tract symptoms: Secondary | ICD-10-CM | POA: Diagnosis not present

## 2021-11-23 DIAGNOSIS — M6281 Muscle weakness (generalized): Secondary | ICD-10-CM | POA: Diagnosis not present

## 2021-11-23 DIAGNOSIS — J449 Chronic obstructive pulmonary disease, unspecified: Secondary | ICD-10-CM | POA: Diagnosis not present

## 2021-11-23 DIAGNOSIS — R52 Pain, unspecified: Secondary | ICD-10-CM | POA: Diagnosis not present

## 2021-11-23 DIAGNOSIS — K2100 Gastro-esophageal reflux disease with esophagitis, without bleeding: Secondary | ICD-10-CM | POA: Diagnosis not present

## 2021-11-23 DIAGNOSIS — F1020 Alcohol dependence, uncomplicated: Secondary | ICD-10-CM | POA: Diagnosis not present

## 2021-11-29 ENCOUNTER — Other Ambulatory Visit: Payer: Self-pay

## 2021-11-29 NOTE — Patient Outreach (Signed)
[facility]) Care Management  11/29/2021  Larry [name] 06-08-1950 [id number]   Telephone call to patient for [employer] referral-high risk patient.  All numbers no answer. However, did speak to someone at [name] is no longer inpatient there.  Also telephone call to DSS worker [name]-patient is no longer in their care.  They also have no further info on patient.    Plan: RN CM will attempt again within 4 business days and send letter.  [name], RN, MSN [facility] Care Management Care Management Coordinator Direct Line [phone] Toll Free: [phone]  Fax: [phone]

## 2021-11-30 ENCOUNTER — Other Ambulatory Visit: Payer: Self-pay

## 2021-11-30 ENCOUNTER — Ambulatory Visit: Payer: Self-pay

## 2021-11-30 NOTE — Patient Outreach (Signed)
[facility]) Care Management  11/30/2021  Larry [name] 1950-09-15 [id number]   Telephone call to patient for [employer]-high risk patient referral.  No answer. Unable to leave a message.   Plan: RN CM will attempt again within 4 business days.  [name], RN, MSN [facility] Care Management Care Management Coordinator Direct Line [phone] Toll Free: [phone]  Fax: [phone]

## 2021-12-05 ENCOUNTER — Ambulatory Visit: Payer: Self-pay

## 2021-12-06 ENCOUNTER — Other Ambulatory Visit: Payer: Self-pay

## 2021-12-06 NOTE — Patient Outreach (Signed)
[facility]) Care Management  12/06/2021  SALVADOR COUPE April 08, 1951 [id number]   Telephone call to patient for [employer]-high risk patient referral.  No answer. Unable to leave a message.    Plan: RN CM will attempt again within 3 weeks.    [name], RN, MSN [facility] Care Management Care Management Coordinator Direct Line [phone] Toll Free: [phone]  Fax: [phone]

## 2021-12-09 ENCOUNTER — Encounter (HOSPITAL_COMMUNITY): Payer: Self-pay

## 2021-12-09 ENCOUNTER — Inpatient Hospital Stay (HOSPITAL_COMMUNITY)
Admission: EM | Admit: 2021-12-09 | Discharge: 2021-12-13 | DRG: 896 | Disposition: A | Discharge status: Skilled Nursing Facility | Payer: Medicare HMO | Attending: Internal Medicine | Admitting: Internal Medicine

## 2021-12-09 ENCOUNTER — Other Ambulatory Visit: Payer: Self-pay

## 2021-12-09 DIAGNOSIS — R296 Repeated falls: Secondary | ICD-10-CM | POA: Diagnosis present

## 2021-12-09 DIAGNOSIS — E039 Hypothyroidism, unspecified: Secondary | ICD-10-CM | POA: Diagnosis present

## 2021-12-09 DIAGNOSIS — E860 Dehydration: Secondary | ICD-10-CM | POA: Diagnosis not present

## 2021-12-09 DIAGNOSIS — Z681 Body mass index (BMI) 19 or less, adult: Secondary | ICD-10-CM

## 2021-12-09 DIAGNOSIS — F10129 Alcohol abuse with intoxication, unspecified: Secondary | ICD-10-CM | POA: Diagnosis present

## 2021-12-09 DIAGNOSIS — F1020 Alcohol dependence, uncomplicated: Secondary | ICD-10-CM | POA: Diagnosis present

## 2021-12-09 DIAGNOSIS — I10 Essential (primary) hypertension: Secondary | ICD-10-CM | POA: Diagnosis present

## 2021-12-09 DIAGNOSIS — I4510 Unspecified right bundle-branch block: Secondary | ICD-10-CM | POA: Diagnosis not present

## 2021-12-09 DIAGNOSIS — F10139 Alcohol abuse with withdrawal, unspecified: Secondary | ICD-10-CM | POA: Diagnosis present

## 2021-12-09 DIAGNOSIS — J449 Chronic obstructive pulmonary disease, unspecified: Secondary | ICD-10-CM | POA: Diagnosis present

## 2021-12-09 DIAGNOSIS — Z9842 Cataract extraction status, left eye: Secondary | ICD-10-CM | POA: Diagnosis not present

## 2021-12-09 DIAGNOSIS — E8729 Other acidosis: Secondary | ICD-10-CM | POA: Diagnosis present

## 2021-12-09 DIAGNOSIS — E43 Unspecified severe protein-calorie malnutrition: Secondary | ICD-10-CM | POA: Diagnosis present

## 2021-12-09 DIAGNOSIS — Z9841 Cataract extraction status, right eye: Secondary | ICD-10-CM | POA: Diagnosis not present

## 2021-12-09 DIAGNOSIS — G319 Degenerative disease of nervous system, unspecified: Secondary | ICD-10-CM | POA: Diagnosis present

## 2021-12-09 DIAGNOSIS — Z79899 Other long term (current) drug therapy: Secondary | ICD-10-CM

## 2021-12-09 DIAGNOSIS — F10920 Alcohol use, unspecified with intoxication, uncomplicated: Principal | ICD-10-CM

## 2021-12-09 DIAGNOSIS — E440 Moderate protein-calorie malnutrition: Secondary | ICD-10-CM | POA: Diagnosis not present

## 2021-12-09 DIAGNOSIS — R2689 Other abnormalities of gait and mobility: Secondary | ICD-10-CM | POA: Diagnosis not present

## 2021-12-09 DIAGNOSIS — M6281 Muscle weakness (generalized): Secondary | ICD-10-CM | POA: Diagnosis not present

## 2021-12-09 DIAGNOSIS — F10932 Alcohol use, unspecified with withdrawal with perceptual disturbance: Secondary | ICD-10-CM | POA: Diagnosis not present

## 2021-12-09 DIAGNOSIS — R5381 Other malaise: Secondary | ICD-10-CM

## 2021-12-09 DIAGNOSIS — Y908 Blood alcohol level of 240 mg/100 ml or more: Secondary | ICD-10-CM | POA: Diagnosis present

## 2021-12-09 DIAGNOSIS — Z833 Family history of diabetes mellitus: Secondary | ICD-10-CM | POA: Diagnosis not present

## 2021-12-09 DIAGNOSIS — R42 Dizziness and giddiness: Secondary | ICD-10-CM | POA: Diagnosis not present

## 2021-12-09 DIAGNOSIS — R531 Weakness: Secondary | ICD-10-CM

## 2021-12-09 DIAGNOSIS — G894 Chronic pain syndrome: Secondary | ICD-10-CM | POA: Diagnosis not present

## 2021-12-09 DIAGNOSIS — D649 Anemia, unspecified: Secondary | ICD-10-CM | POA: Diagnosis not present

## 2021-12-09 DIAGNOSIS — Z8349 Family history of other endocrine, nutritional and metabolic diseases: Secondary | ICD-10-CM

## 2021-12-09 DIAGNOSIS — F10939 Alcohol use, unspecified with withdrawal, unspecified: Secondary | ICD-10-CM | POA: Diagnosis present

## 2021-12-09 DIAGNOSIS — Z91148 Patient's other noncompliance with medication regimen for other reason: Secondary | ICD-10-CM | POA: Diagnosis not present

## 2021-12-09 DIAGNOSIS — F17210 Nicotine dependence, cigarettes, uncomplicated: Secondary | ICD-10-CM | POA: Diagnosis present

## 2021-12-09 DIAGNOSIS — R1312 Dysphagia, oropharyngeal phase: Secondary | ICD-10-CM | POA: Diagnosis not present

## 2021-12-09 DIAGNOSIS — R262 Difficulty in walking, not elsewhere classified: Secondary | ICD-10-CM | POA: Diagnosis not present

## 2021-12-09 DIAGNOSIS — Z961 Presence of intraocular lens: Secondary | ICD-10-CM | POA: Diagnosis present

## 2021-12-09 DIAGNOSIS — D696 Thrombocytopenia, unspecified: Secondary | ICD-10-CM

## 2021-12-09 DIAGNOSIS — N400 Enlarged prostate without lower urinary tract symptoms: Secondary | ICD-10-CM | POA: Diagnosis not present

## 2021-12-09 MED ORDER — SODIUM CHLORIDE 0.9 % IV BOLUS
1000.0000 mL | Freq: Once | INTRAVENOUS | Status: AC
  Administered 2021-12-09: 1000 mL via INTRAVENOUS

## 2021-12-09 NOTE — ED Triage Notes (Signed)
Pt brought in by EMS from home with c/o dizziness, EMS report pt had two 5ths of liquor in bedroom and turned upon EMS arrival. Pt denies pain, says he is dying, and c/o dizziness that started 6 days ago, pt says the last time he walked was 6 days ago. Pt lives alone, EMS report bed was soaked from his water bottle spilling on bed.

## 2021-12-09 NOTE — ED Notes (Signed)
Pt has balls of feces coming out of pants when removing clothing- pt is able to move all extremities.

## 2021-12-09 NOTE — ED Provider Notes (Signed)
[name] EMERGENCY DEPARTMENT Provider Note   CSN: [id number] Arrival date & time: 12/09/21  2241     History {Add pertinent medical, surgical, social history, OB history to HPI:1} Chief Complaint  Patient presents with   Dizziness    Larry [name] is a 71 y.o. male.  Pt with hx etoh use disorder, presents via EMS from home w generalized weakness. Pt called EMS. EMS noted empty etoh bottles around residence. Pt states now has become too weak to walk and get around. Denies trauma/injury. Poor po intake, poor nutrition. Unspecified wt loss. Denies depression or thoughts of self harm. No headache. No chest pain or discomfort. No sob. No cough or uri symptoms. No fever or chills. No abd pain or nvd. No gu c/o.   The history is provided by the patient, medical records and the EMS personnel.  Dizziness Associated symptoms: no chest pain, no diarrhea, no headaches, no shortness of breath and no vomiting        Home Medications Prior to Admission medications   Medication Sig Start Date End Date Taking? Authorizing Provider  acetaminophen (TYLENOL) 325 MG tablet Take 2 tablets (650 mg total) by mouth every 6 (six) hours as needed for headache, mild pain or fever. 11/01/21 11/01/22  Vassie Loll, MD  cholecalciferol (VITAMIN D3) 25 MCG (1000 UNIT) tablet Take 1,000 Units by mouth daily.    [provider]  cloNIDine (CATAPRES) 0.1 MG tablet Take 1 tablet (0.1 mg total) by mouth 3 (three) times daily. 11/01/21   Vassie Loll, MD  cyanocobalamin 1000 MCG tablet Take 1 tablet (1,000 mcg total) by mouth daily. 03/15/21   Shon Hale, MD  folic acid (FOLVITE) 1 MG tablet Take 1 tablet (1 mg total) by mouth daily. Patient not taking: Reported on 10/13/2021 03/16/21   Shon Hale, MD  ipratropium-albuterol (DUONEB) 0.5-2.5 (3) MG/3ML SOLN Take 3 mLs by nebulization 3 (three) times daily. 11/01/21   Vassie Loll, MD  Multiple Vitamin (MULTIVITAMIN WITH MINERALS) TABS tablet  Take 1 tablet by mouth daily. Patient not taking: Reported on 10/13/2021 03/16/21   Shon Hale, MD  oxyCODONE (OXY IR/ROXICODONE) 5 MG immediate release tablet Take 1 tablet (5 mg total) by mouth every 6 (six) hours as needed for severe pain. 11/01/21   Vassie Loll, MD  pantoprazole (PROTONIX) 40 MG tablet Take 1 tablet (40 mg total) by mouth daily. 11/01/21 11/01/22  Vassie Loll, MD  tamsulosin (FLOMAX) 0.4 MG CAPS capsule Take 1 capsule (0.4 mg total) by mouth daily after supper. 11/02/21   Vassie Loll, MD  thiamine 100 MG tablet Take 1 tablet (100 mg total) by mouth daily. 03/15/21   Shon Hale, MD      Allergies    Patient has no known allergies.    Review of Systems   Review of Systems  Constitutional:  Negative for chills and fever.  HENT:  Negative for sore throat.   Eyes:  Negative for visual disturbance.  Respiratory:  Negative for cough and shortness of breath.   Cardiovascular:  Negative for chest pain.  Gastrointestinal:  Negative for abdominal pain, diarrhea and vomiting.  Genitourinary:  Negative for dysuria and flank pain.  Musculoskeletal:  Negative for back pain and neck pain.  Skin:  Negative for rash.  Neurological:  Positive for dizziness. Negative for headaches.  Hematological:  Does not bruise/bleed easily.  Psychiatric/Behavioral:  Negative for confusion.     Physical Exam Updated Vital Signs BP 139/87   Pulse 93  Temp 97.9 F (36.6 C) (Oral)   Resp 18   Ht 1.88 m ( )   Wt 55.8 kg   SpO2 96%   BMI 15.79 kg/m  Physical Exam Vitals and nursing note reviewed.  Constitutional:      Appearance: He is well-developed.     Comments: Weak/frail appearing.   HENT:     Head: Atraumatic.     Nose: Nose normal.     Mouth/Throat:     Mouth: Mucous membranes are moist.     Pharynx: Oropharynx is clear.  Eyes:     General: No scleral icterus.    Conjunctiva/sclera: Conjunctivae normal.     Pupils: Pupils are equal, round, and reactive  to light.  Neck:     Vascular: No carotid bruit.     Trachea: No tracheal deviation.     Comments: No stiffness or rigidity.  Cardiovascular:     Rate and Rhythm: Normal rate and regular rhythm.     Pulses: Normal pulses.     Heart sounds: Normal heart sounds. No murmur heard.    No friction rub. No gallop.  Pulmonary:     Effort: Pulmonary effort is normal. No accessory muscle usage or respiratory distress.     Breath sounds: Normal breath sounds.  Abdominal:     General: Bowel sounds are normal. There is no distension.     Palpations: Abdomen is soft.     Tenderness: There is no abdominal tenderness. There is no guarding.  Genitourinary:    Comments: No cva tenderness. Musculoskeletal:        General: No swelling or tenderness.     Cervical back: Normal range of motion and neck supple. No rigidity or tenderness.     Comments: CTLS spine, non tender, aligned, no step off. No focal bony tenderness or pain on extremity exam.   Lymphadenopathy:     Cervical: No cervical adenopathy.  Skin:    General: Skin is warm and dry.     Findings: No rash.  Neurological:     Mental Status: He is alert.     Comments: Alert, speech clear. Motor/sens grossly intact bil. No tremor.   Psychiatric:        Mood and Affect: Mood normal.     ED Results / Procedures / Treatments   Labs (all labs ordered are listed, but only abnormal results are displayed) Results for orders placed or performed during the hospital encounter of 10/13/21  SARS Coronavirus 2 by RT PCR (hospital order, performed in [facility] hospital lab) *cepheid single result test* Anterior Nasal Swab   Specimen: Anterior Nasal Swab  Result Value Ref Range   SARS Coronavirus 2 by RT PCR NEGATIVE NEGATIVE  Culture, Respiratory w Gram Stain   Specimen: Tracheal Aspirate; Respiratory  Result Value Ref Range   Specimen Description      TRACHEAL ASPIRATE Performed at [facility], [address] [place], [place] [address]     Special Requests      NONE Performed at [facility], [address], [place], [place] [address]    Gram Stain      NO SQUAMOUS EPITHELIAL CELLS SEEN FEW WBC SEEN FEW GRAM POSITIVE COCCI    Culture      MODERATE Normal respiratory flora-no Staph aureus or Pseudomonas seen Performed at [facility], [address], [place], [place] [address]    Report Status 10/23/2021 FINAL   Ethanol  Result Value Ref Range   Alcohol, Ethyl (  B) 85 (H) <10 mg/dL  Comprehensive metabolic panel  Result Value Ref Range   Sodium 133 (L) 135 - 145 mmol/L   Potassium 3.6 3.5 - 5.1 mmol/L   Chloride 97 (L) 98 - 111 mmol/L   CO2 23 22 - 32 mmol/L   Glucose, Bld 87 70 - 99 mg/dL   BUN 15 8 - 23 mg/dL   Creatinine, Ser 8.11 0.61 - 1.24 mg/dL   Calcium 8.7 (L) 8.9 - 10.3 mg/dL   Total Protein 6.1 (L) 6.5 - 8.1 g/dL   Albumin 3.4 (L) 3.5 - 5.0 g/dL   AST 54 (H) 15 - 41 U/L   ALT 37 0 - 44 U/L   Alkaline Phosphatase 67 38 - 126 U/L   Total Bilirubin 1.0 0.3 - 1.2 mg/dL   GFR, Estimated >91 >47 mL/min   Anion gap 13 5 - 15  CBC with Differential  Result Value Ref Range   WBC 5.5 4.0 - 10.5 K/uL   RBC 3.25 (L) 4.22 - 5.81 MIL/uL   Hemoglobin 11.4 (L) 13.0 - 17.0 g/dL   HCT 82.9 (L) 56.2 - 13.0 %   MCV 100.6 (H) 80.0 - 100.0 fL   MCH 35.1 (H) 26.0 - 34.0 pg   MCHC 34.9 30.0 - 36.0 g/dL   RDW 86.5 78.4 - 69.6 %   Platelets 148 (L) 150 - 400 K/uL   nRBC 0.4 (H) 0.0 - 0.2 %   Neutrophils Relative % 56 %   Neutro Abs 3.0 1.7 - 7.7 K/uL   Lymphocytes Relative 33 %   Lymphs Abs 1.8 0.7 - 4.0 K/uL   Monocytes Relative 10 %   Monocytes Absolute 0.6 0.1 - 1.0 K/uL   Eosinophils Relative 1 %   Eosinophils Absolute 0.1 0.0 - 0.5 K/uL   Basophils Relative 0 %   Basophils Absolute 0.0 0.0 - 0.1 K/uL   Immature Granulocytes 0 %   Abs Immature Granulocytes 0.02 0.00 - 0.07 K/uL  Urinalysis, Routine w reflex microscopic Urine, Clean Catch  Result Value Ref Range   Color, Urine YELLOW YELLOW    APPearance CLEAR CLEAR   Specific Gravity, Urine 1.015 1.005 - 1.030   pH 5.0 5.0 - 8.0   Glucose, UA NEGATIVE NEGATIVE mg/dL   Hgb urine dipstick LARGE (A) NEGATIVE   Bilirubin Urine NEGATIVE NEGATIVE   Ketones, ur 20 (A) NEGATIVE mg/dL   Protein, ur NEGATIVE NEGATIVE mg/dL   Nitrite NEGATIVE NEGATIVE   Leukocytes,Ua NEGATIVE NEGATIVE   RBC / HPF 21-50 0 - 5 RBC/hpf   WBC, UA 0-5 0 - 5 WBC/hpf   Bacteria, UA NONE SEEN NONE SEEN   Mucus PRESENT   Urine rapid drug screen (hosp performed)  Result Value Ref Range   Opiates NONE DETECTED NONE DETECTED   Cocaine NONE DETECTED NONE DETECTED   Benzodiazepines NONE DETECTED NONE DETECTED   Amphetamines NONE DETECTED NONE DETECTED   Tetrahydrocannabinol NONE DETECTED NONE DETECTED   Barbiturates NONE DETECTED NONE DETECTED  Protime-INR  Result Value Ref Range   Prothrombin Time 12.7 11.4 - 15.2 seconds   INR 1.0 0.8 - 1.2  CK  Result Value Ref Range   Total CK 74 49 - 397 U/L  Lipase, blood  Result Value Ref Range   Lipase 31 11 - 51 U/L  Vitamin B12  Result Value Ref Range   Vitamin B-12 973 (H) 180 - 914 pg/mL  Folate  Result Value Ref Range   Folate 3.8 (L) >5.9  ng/mL  VITAMIN D 25 Hydroxy (Vit-D Deficiency, Fractures)  Result Value Ref Range   Vit D, 25-Hydroxy 50.11 30 - 100 ng/mL  Comprehensive metabolic panel  Result Value Ref Range   Sodium 134 (L) 135 - 145 mmol/L   Potassium 3.4 (L) 3.5 - 5.1 mmol/L   Chloride 100 98 - 111 mmol/L   CO2 26 22 - 32 mmol/L   Glucose, Bld 112 (H) 70 - 99 mg/dL   BUN 17 8 - 23 mg/dL   Creatinine, Ser 5.42 0.61 - 1.24 mg/dL   Calcium 8.4 (L) 8.9 - 10.3 mg/dL   Total Protein 5.6 (L) 6.5 - 8.1 g/dL   Albumin 3.0 (L) 3.5 - 5.0 g/dL   AST 39 15 - 41 U/L   ALT 29 0 - 44 U/L   Alkaline Phosphatase 77 38 - 126 U/L   Total Bilirubin 0.9 0.3 - 1.2 mg/dL   GFR, Estimated >70 >62 mL/min   Anion gap 8 5 - 15  Magnesium  Result Value Ref Range   Magnesium 1.5 (L) 1.7 - 2.4 mg/dL  Basic  metabolic panel  Result Value Ref Range   Sodium 135 135 - 145 mmol/L   Potassium 3.8 3.5 - 5.1 mmol/L   Chloride 103 98 - 111 mmol/L   CO2 26 22 - 32 mmol/L   Glucose, Bld 104 (H) 70 - 99 mg/dL   BUN 10 8 - 23 mg/dL   Creatinine, Ser 3.76 (L) 0.61 - 1.24 mg/dL   Calcium 8.6 (L) 8.9 - 10.3 mg/dL   GFR, Estimated >28 >31 mL/min   Anion gap 6 5 - 15  Magnesium  Result Value Ref Range   Magnesium 1.8 1.7 - 2.4 mg/dL  CBC  Result Value Ref Range   WBC 7.0 4.0 - 10.5 K/uL   RBC 3.50 (L) 4.22 - 5.81 MIL/uL   Hemoglobin 12.1 (L) 13.0 - 17.0 g/dL   HCT 51.7 (L) 61.6 - 07.3 %   MCV 103.7 (H) 80.0 - 100.0 fL   MCH 34.6 (H) 26.0 - 34.0 pg   MCHC 33.3 30.0 - 36.0 g/dL   RDW 71.0 62.6 - 94.8 %   Platelets 157 150 - 400 K/uL   nRBC 0.0 0.0 - 0.2 %  Magnesium  Result Value Ref Range   Magnesium 2.1 1.7 - 2.4 mg/dL  Phosphorus  Result Value Ref Range   Phosphorus 3.1 2.5 - 4.6 mg/dL  Glucose, capillary  Result Value Ref Range   Glucose-Capillary 116 (H) 70 - 99 mg/dL  Basic metabolic panel  Result Value Ref Range   Sodium 135 135 - 145 mmol/L   Potassium 3.6 3.5 - 5.1 mmol/L   Chloride 101 98 - 111 mmol/L   CO2 28 22 - 32 mmol/L   Glucose, Bld 110 (H) 70 - 99 mg/dL   BUN 15 8 - 23 mg/dL   Creatinine, Ser 5.46 0.61 - 1.24 mg/dL   Calcium 8.3 (L) 8.9 - 10.3 mg/dL   GFR, Estimated >27 >03 mL/min   Anion gap 6 5 - 15  Basic metabolic panel  Result Value Ref Range   Sodium 133 (L) 135 - 145 mmol/L   Potassium 3.9 3.5 - 5.1 mmol/L   Chloride 100 98 - 111 mmol/L   CO2 25 22 - 32 mmol/L   Glucose, Bld 112 (H) 70 - 99 mg/dL   BUN 15 8 - 23 mg/dL   Creatinine, Ser 5.00 (L) 0.61 - 1.24 mg/dL   Calcium  8.7 (L) 8.9 - 10.3 mg/dL   GFR, Estimated >16 >10 mL/min   Anion gap 8 5 - 15  CBC  Result Value Ref Range   WBC 6.1 4.0 - 10.5 K/uL   RBC 2.84 (L) 4.22 - 5.81 MIL/uL   Hemoglobin 10.1 (L) 13.0 - 17.0 g/dL   HCT 96.0 (L) 45.4 - 09.8 %   MCV 100.7 (H) 80.0 - 100.0 fL   MCH 35.6  (H) 26.0 - 34.0 pg   MCHC 35.3 30.0 - 36.0 g/dL   RDW 11.9 14.7 - 82.9 %   Platelets 278 150 - 400 K/uL   nRBC 0.0 0.0 - 0.2 %  Magnesium  Result Value Ref Range   Magnesium 1.9 1.7 - 2.4 mg/dL  CBC  Result Value Ref Range   WBC 7.5 4.0 - 10.5 K/uL   RBC 2.98 (L) 4.22 - 5.81 MIL/uL   Hemoglobin 10.2 (L) 13.0 - 17.0 g/dL   HCT 56.2 (L) 13.0 - 86.5 %   MCV 104.7 (H) 80.0 - 100.0 fL   MCH 34.2 (H) 26.0 - 34.0 pg   MCHC 32.7 30.0 - 36.0 g/dL   RDW 78.4 69.6 - 29.5 %   Platelets 298 150 - 400 K/uL   nRBC 0.0 0.0 - 0.2 %  Comprehensive metabolic panel  Result Value Ref Range   Sodium 138 135 - 145 mmol/L   Potassium 3.3 (L) 3.5 - 5.1 mmol/L   Chloride 107 98 - 111 mmol/L   CO2 27 22 - 32 mmol/L   Glucose, Bld 136 (H) 70 - 99 mg/dL   BUN 16 8 - 23 mg/dL   Creatinine, Ser 2.84 (L) 0.61 - 1.24 mg/dL   Calcium 8.8 (L) 8.9 - 10.3 mg/dL   Total Protein 5.8 (L) 6.5 - 8.1 g/dL   Albumin 2.4 (L) 3.5 - 5.0 g/dL   AST 21 15 - 41 U/L   ALT 23 0 - 44 U/L   Alkaline Phosphatase 87 38 - 126 U/L   Total Bilirubin 0.5 0.3 - 1.2 mg/dL   GFR, Estimated >13 >24 mL/min   Anion gap 4 (L) 5 - 15  Blood gas, arterial  Result Value Ref Range   FIO2 100.0 %   pH, Arterial 7.45 7.35 - 7.45   pCO2 arterial 42 32 - 48 mmHg   pO2, Arterial 150 (H) 83 - 108 mmHg   Bicarbonate 29.2 (H) 20.0 - 28.0 mmol/L   Acid-Base Excess 4.7 (H) 0.0 - 2.0 mmol/L   O2 Saturation 100 %   Patient temperature 37.0    Collection site RIGHT RADIAL    Drawn by [id number]    Allens test (pass/fail) PASS PASS  Blood gas, arterial  Result Value Ref Range   FIO2 60.00 %   pH, Arterial 7.52 (H) 7.35 - 7.45   pCO2 arterial 35 32 - 48 mmHg   pO2, Arterial 123 (H) 83 - 108 mmHg   Bicarbonate 28.6 (H) 20.0 - 28.0 mmol/L   Acid-Base Excess 5.7 (H) 0.0 - 2.0 mmol/L   O2 Saturation 100 %   Patient temperature 37.0    Collection site RIGHT RADIAL    Drawn by [id number]    Allens test (pass/fail) PASS PASS  Triglycerides  Result Value  Ref Range   Triglycerides 81 <150 mg/dL  Blood gas, arterial  Result Value Ref Range   FIO2 40.00 %   pH, Arterial 7.44 7.35 - 7.45   pCO2 arterial 44 32 - 48 mmHg  pO2, Arterial 90 83 - 108 mmHg   Bicarbonate 29.9 (H) 20.0 - 28.0 mmol/L   Acid-Base Excess 5.0 (H) 0.0 - 2.0 mmol/L   O2 Saturation 100 %   Patient temperature 37.0    Collection site RIGHT RADIAL    Drawn by [id number]    Allens test (pass/fail) PASS PASS  Glucose, capillary  Result Value Ref Range   Glucose-Capillary 134 (H) 70 - 99 mg/dL  Glucose, capillary  Result Value Ref Range   Glucose-Capillary 128 (H) 70 - 99 mg/dL  Glucose, capillary  Result Value Ref Range   Glucose-Capillary 116 (H) 70 - 99 mg/dL  Glucose, capillary  Result Value Ref Range   Glucose-Capillary 126 (H) 70 - 99 mg/dL  Renal function panel  Result Value Ref Range   Sodium 140 135 - 145 mmol/L   Potassium 3.7 3.5 - 5.1 mmol/L   Chloride 111 98 - 111 mmol/L   CO2 23 22 - 32 mmol/L   Glucose, Bld 127 (H) 70 - 99 mg/dL   BUN 16 8 - 23 mg/dL   Creatinine, Ser 2.13 (L) 0.61 - 1.24 mg/dL   Calcium 8.5 (L) 8.9 - 10.3 mg/dL   Phosphorus 3.3 2.5 - 4.6 mg/dL   Albumin 2.1 (L) 3.5 - 5.0 g/dL   GFR, Estimated >08 >65 mL/min   Anion gap 6 5 - 15  CBC  Result Value Ref Range   WBC 6.1 4.0 - 10.5 K/uL   RBC 2.51 (L) 4.22 - 5.81 MIL/uL   Hemoglobin 8.5 (L) 13.0 - 17.0 g/dL   HCT 78.4 (L) 69.6 - 29.5 %   MCV 104.8 (H) 80.0 - 100.0 fL   MCH 33.9 26.0 - 34.0 pg   MCHC 32.3 30.0 - 36.0 g/dL   RDW 28.4 13.2 - 44.0 %   Platelets 296 150 - 400 K/uL   nRBC 0.0 0.0 - 0.2 %  Magnesium  Result Value Ref Range   Magnesium 1.9 1.7 - 2.4 mg/dL  Glucose, capillary  Result Value Ref Range   Glucose-Capillary 132 (H) 70 - 99 mg/dL  Glucose, capillary  Result Value Ref Range   Glucose-Capillary 116 (H) 70 - 99 mg/dL  Glucose, capillary  Result Value Ref Range   Glucose-Capillary 137 (H) 70 - 99 mg/dL  Glucose, capillary  Result Value Ref Range    Glucose-Capillary 126 (H) 70 - 99 mg/dL  Glucose, capillary  Result Value Ref Range   Glucose-Capillary 135 (H) 70 - 99 mg/dL  Glucose, capillary  Result Value Ref Range   Glucose-Capillary 124 (H) 70 - 99 mg/dL  Glucose, capillary  Result Value Ref Range   Glucose-Capillary 134 (H) 70 - 99 mg/dL  CBC  Result Value Ref Range   WBC 4.8 4.0 - 10.5 K/uL   RBC 2.54 (L) 4.22 - 5.81 MIL/uL   Hemoglobin 8.6 (L) 13.0 - 17.0 g/dL   HCT 10.2 (L) 72.5 - 36.6 %   MCV 103.5 (H) 80.0 - 100.0 fL   MCH 33.9 26.0 - 34.0 pg   MCHC 32.7 30.0 - 36.0 g/dL   RDW 44.0 34.7 - 42.5 %   Platelets 302 150 - 400 K/uL   nRBC 0.0 0.0 - 0.2 %  Comprehensive metabolic panel  Result Value Ref Range   Sodium 139 135 - 145 mmol/L   Potassium 3.7 3.5 - 5.1 mmol/L   Chloride 109 98 - 111 mmol/L   CO2 25 22 - 32 mmol/L   Glucose, Bld 144 (H)  70 - 99 mg/dL   BUN 15 8 - 23 mg/dL   Creatinine, Ser 8.67 (L) 0.61 - 1.24 mg/dL   Calcium 8.5 (L) 8.9 - 10.3 mg/dL   Total Protein 5.4 (L) 6.5 - 8.1 g/dL   Albumin 2.0 (L) 3.5 - 5.0 g/dL   AST 82 (H) 15 - 41 U/L   ALT 73 (H) 0 - 44 U/L   Alkaline Phosphatase 172 (H) 38 - 126 U/L   Total Bilirubin 0.5 0.3 - 1.2 mg/dL   GFR, Estimated >61 >95 mL/min   Anion gap 5 5 - 15  Glucose, capillary  Result Value Ref Range   Glucose-Capillary 118 (H) 70 - 99 mg/dL  Glucose, capillary  Result Value Ref Range   Glucose-Capillary 143 (H) 70 - 99 mg/dL  Glucose, capillary  Result Value Ref Range   Glucose-Capillary 151 (H) 70 - 99 mg/dL  Glucose, capillary  Result Value Ref Range   Glucose-Capillary 141 (H) 70 - 99 mg/dL  Glucose, capillary  Result Value Ref Range   Glucose-Capillary 134 (H) 70 - 99 mg/dL  Comprehensive metabolic panel  Result Value Ref Range   Sodium 138 135 - 145 mmol/L   Potassium 4.1 3.5 - 5.1 mmol/L   Chloride 107 98 - 111 mmol/L   CO2 25 22 - 32 mmol/L   Glucose, Bld 122 (H) 70 - 99 mg/dL   BUN 22 8 - 23 mg/dL   Creatinine, Ser 0.93 (L) 0.61 -  1.24 mg/dL   Calcium 8.3 (L) 8.9 - 10.3 mg/dL   Total Protein 5.1 (L) 6.5 - 8.1 g/dL   Albumin 2.0 (L) 3.5 - 5.0 g/dL   AST 49 (H) 15 - 41 U/L   ALT 60 (H) 0 - 44 U/L   Alkaline Phosphatase 182 (H) 38 - 126 U/L   Total Bilirubin 0.6 0.3 - 1.2 mg/dL   GFR, Estimated >26 >71 mL/min   Anion gap 6 5 - 15  Protime-INR  Result Value Ref Range   Prothrombin Time 13.8 11.4 - 15.2 seconds   INR 1.1 0.8 - 1.2  Glucose, capillary  Result Value Ref Range   Glucose-Capillary 126 (H) 70 - 99 mg/dL   Comment 1 Notify RN    Comment 2 Document in Chart   Glucose, capillary  Result Value Ref Range   Glucose-Capillary 122 (H) 70 - 99 mg/dL   Comment 1 Notify RN    Comment 2 Document in Chart   Glucose, capillary  Result Value Ref Range   Glucose-Capillary 124 (H) 70 - 99 mg/dL   Comment 1 Notify RN    Comment 2 Document in Chart   Glucose, capillary  Result Value Ref Range   Glucose-Capillary 137 (H) 70 - 99 mg/dL  Glucose, capillary  Result Value Ref Range   Glucose-Capillary 119 (H) 70 - 99 mg/dL  Glucose, capillary  Result Value Ref Range   Glucose-Capillary 118 (H) 70 - 99 mg/dL  CBC  Result Value Ref Range   WBC 6.6 4.0 - 10.5 K/uL   RBC 2.59 (L) 4.22 - 5.81 MIL/uL   Hemoglobin 8.8 (L) 13.0 - 17.0 g/dL   HCT 24.5 (L) 80.9 - 98.3 %   MCV 103.9 (H) 80.0 - 100.0 fL   MCH 34.0 26.0 - 34.0 pg   MCHC 32.7 30.0 - 36.0 g/dL   RDW 38.2 50.5 - 39.7 %   Platelets 305 150 - 400 K/uL   nRBC 0.0 0.0 - 0.2 %  Basic  metabolic panel  Result Value Ref Range   Sodium 136 135 - 145 mmol/L   Potassium 3.7 3.5 - 5.1 mmol/L   Chloride 107 98 - 111 mmol/L   CO2 24 22 - 32 mmol/L   Glucose, Bld 150 (H) 70 - 99 mg/dL   BUN 19 8 - 23 mg/dL   Creatinine, Ser 1.61 (L) 0.61 - 1.24 mg/dL   Calcium 8.2 (L) 8.9 - 10.3 mg/dL   GFR, Estimated >09 >60 mL/min   Anion gap 5 5 - 15  Glucose, capillary  Result Value Ref Range   Glucose-Capillary 118 (H) 70 - 99 mg/dL  Glucose, capillary  Result Value Ref  Range   Glucose-Capillary 125 (H) 70 - 99 mg/dL  Glucose, capillary  Result Value Ref Range   Glucose-Capillary 92 70 - 99 mg/dL  Glucose, capillary  Result Value Ref Range   Glucose-Capillary 139 (H) 70 - 99 mg/dL  Glucose, capillary  Result Value Ref Range   Glucose-Capillary 124 (H) 70 - 99 mg/dL  Glucose, capillary  Result Value Ref Range   Glucose-Capillary 115 (H) 70 - 99 mg/dL  Glucose, capillary  Result Value Ref Range   Glucose-Capillary 115 (H) 70 - 99 mg/dL  Glucose, capillary  Result Value Ref Range   Glucose-Capillary 111 (H) 70 - 99 mg/dL  Glucose, capillary  Result Value Ref Range   Glucose-Capillary 120 (H) 70 - 99 mg/dL  Glucose, capillary  Result Value Ref Range   Glucose-Capillary 142 (H) 70 - 99 mg/dL  Glucose, capillary  Result Value Ref Range   Glucose-Capillary 125 (H) 70 - 99 mg/dL  CBC  Result Value Ref Range   WBC 4.9 4.0 - 10.5 K/uL   RBC 2.62 (L) 4.22 - 5.81 MIL/uL   Hemoglobin 8.6 (L) 13.0 - 17.0 g/dL   HCT 45.4 (L) 09.8 - 11.9 %   MCV 105.0 (H) 80.0 - 100.0 fL   MCH 32.8 26.0 - 34.0 pg   MCHC 31.3 30.0 - 36.0 g/dL   RDW 14.7 82.9 - 56.2 %   Platelets 239 150 - 400 K/uL   nRBC 0.0 0.0 - 0.2 %  Basic metabolic panel  Result Value Ref Range   Sodium 137 135 - 145 mmol/L   Potassium 3.8 3.5 - 5.1 mmol/L   Chloride 103 98 - 111 mmol/L   CO2 27 22 - 32 mmol/L   Glucose, Bld 120 (H) 70 - 99 mg/dL   BUN 16 8 - 23 mg/dL   Creatinine, Ser 1.30 (L) 0.61 - 1.24 mg/dL   Calcium 8.0 (L) 8.9 - 10.3 mg/dL   GFR, Estimated >86 >57 mL/min   Anion gap 7 5 - 15  Brain natriuretic peptide  Result Value Ref Range   B Natriuretic Peptide 125.0 (H) 0.0 - 100.0 pg/mL  TSH  Result Value Ref Range   TSH 3.716 0.350 - 4.500 uIU/mL  Glucose, capillary  Result Value Ref Range   Glucose-Capillary 111 (H) 70 - 99 mg/dL  Glucose, capillary  Result Value Ref Range   Glucose-Capillary 109 (H) 70 - 99 mg/dL  Glucose, capillary  Result Value Ref Range    Glucose-Capillary 107 (H) 70 - 99 mg/dL  Glucose, capillary  Result Value Ref Range   Glucose-Capillary 120 (H) 70 - 99 mg/dL  Glucose, capillary  Result Value Ref Range   Glucose-Capillary 111 (H) 70 - 99 mg/dL  Glucose, capillary  Result Value Ref Range   Glucose-Capillary 112 (H) 70 - 99 mg/dL  Glucose, capillary  Result Value Ref Range   Glucose-Capillary 117 (H) 70 - 99 mg/dL  Glucose, capillary  Result Value Ref Range   Glucose-Capillary 138 (H) 70 - 99 mg/dL  Glucose, capillary  Result Value Ref Range   Glucose-Capillary 106 (H) 70 - 99 mg/dL  Glucose, capillary  Result Value Ref Range   Glucose-Capillary 88 70 - 99 mg/dL  Glucose, capillary  Result Value Ref Range   Glucose-Capillary 110 (H) 70 - 99 mg/dL  Glucose, capillary  Result Value Ref Range   Glucose-Capillary 85 70 - 99 mg/dL  Glucose, capillary  Result Value Ref Range   Glucose-Capillary 81 70 - 99 mg/dL  Glucose, capillary  Result Value Ref Range   Glucose-Capillary 76 70 - 99 mg/dL  Glucose, capillary  Result Value Ref Range   Glucose-Capillary 65 (L) 70 - 99 mg/dL  Glucose, capillary  Result Value Ref Range   Glucose-Capillary 115 (H) 70 - 99 mg/dL  Glucose, capillary  Result Value Ref Range   Glucose-Capillary 122 (H) 70 - 99 mg/dL  Glucose, capillary  Result Value Ref Range   Glucose-Capillary 93 70 - 99 mg/dL  Glucose, capillary  Result Value Ref Range   Glucose-Capillary 93 70 - 99 mg/dL  Glucose, capillary  Result Value Ref Range   Glucose-Capillary 89 70 - 99 mg/dL  Glucose, capillary  Result Value Ref Range   Glucose-Capillary 79 70 - 99 mg/dL  Glucose, capillary  Result Value Ref Range   Glucose-Capillary 79 70 - 99 mg/dL  Glucose, capillary  Result Value Ref Range   Glucose-Capillary 119 (H) 70 - 99 mg/dL  Glucose, capillary  Result Value Ref Range   Glucose-Capillary 96 70 - 99 mg/dL  Glucose, capillary  Result Value Ref Range   Glucose-Capillary 105 (H) 70 - 99 mg/dL   Glucose, capillary  Result Value Ref Range   Glucose-Capillary 116 (H) 70 - 99 mg/dL  Glucose, capillary  Result Value Ref Range   Glucose-Capillary 65 (L) 70 - 99 mg/dL   Comment 1 Notify RN   Glucose, capillary  Result Value Ref Range   Glucose-Capillary 128 (H) 70 - 99 mg/dL  Glucose, capillary  Result Value Ref Range   Glucose-Capillary 105 (H) 70 - 99 mg/dL  Glucose, capillary  Result Value Ref Range   Glucose-Capillary 126 (H) 70 - 99 mg/dL  Glucose, capillary  Result Value Ref Range   Glucose-Capillary 116 (H) 70 - 99 mg/dL  Glucose, capillary  Result Value Ref Range   Glucose-Capillary 106 (H) 70 - 99 mg/dL   Comment 1 Notify RN    Comment 2 Document in Chart   Glucose, capillary  Result Value Ref Range   Glucose-Capillary 100 (H) 70 - 99 mg/dL   Comment 1 Notify RN    Comment 2 Document in Chart   Glucose, capillary  Result Value Ref Range   Glucose-Capillary 85 70 - 99 mg/dL  Glucose, capillary  Result Value Ref Range   Glucose-Capillary 91 70 - 99 mg/dL  Glucose, capillary  Result Value Ref Range   Glucose-Capillary 97 70 - 99 mg/dL  Glucose, capillary  Result Value Ref Range   Glucose-Capillary 117 (H) 70 - 99 mg/dL  Glucose, capillary  Result Value Ref Range   Glucose-Capillary 118 (H) 70 - 99 mg/dL  Glucose, capillary  Result Value Ref Range   Glucose-Capillary 105 (H) 70 - 99 mg/dL  Glucose, capillary  Result Value Ref Range   Glucose-Capillary 97 70 - 99  mg/dL  Glucose, capillary  Result Value Ref Range   Glucose-Capillary 106 (H) 70 - 99 mg/dL  Glucose, capillary  Result Value Ref Range   Glucose-Capillary 100 (H) 70 - 99 mg/dL  Glucose, capillary  Result Value Ref Range   Glucose-Capillary 106 (H) 70 - 99 mg/dL  Glucose, capillary  Result Value Ref Range   Glucose-Capillary 110 (H) 70 - 99 mg/dL  Glucose, capillary  Result Value Ref Range   Glucose-Capillary 89 70 - 99 mg/dL  Glucose, capillary  Result Value Ref Range    Glucose-Capillary 89 70 - 99 mg/dL  Glucose, capillary  Result Value Ref Range   Glucose-Capillary 108 (H) 70 - 99 mg/dL  Troponin I (High Sensitivity)  Result Value Ref Range   Troponin I (High Sensitivity) 27 (H) <18 ng/L  Troponin I (High Sensitivity)  Result Value Ref Range   Troponin I (High Sensitivity) 26 (H) <18 ng/L   No results found.  EKG None  Radiology No results found.  Procedures Procedures  {Document cardiac monitor, telemetry assessment procedure when appropriate:1}  Medications Ordered in ED Medications  sodium chloride 0.9 % bolus 1,000 mL (has no administration in time range)    ED Course/ Medical Decision Making/ A&P                           Medical Decision Making Amount and/or Complexity of Data Reviewed Labs: ordered.  Iv ns. Continuous pulse ox and cardiac monitoring. Labs ordered/sent.   Reviewed nursing notes and prior charts for additional history. External reports reviewed. Additional history from: EMS.   Cardiac monitor: sinus rhythm, rate 90.  Labs reviewed/interpreted by me -   Iv ns bolus.     {Document critical care time when appropriate:1} {Document review of labs and clinical decision tools ie heart score, Chads2Vasc2 etc:1}  {Document your independent review of radiology images, and any outside records:1} {Document your discussion with family members, caretakers, and with consultants:1} {Document social determinants of health affecting pt's care:1} {Document your decision making why or why not admission, treatments were needed:1} Final Clinical Impression(s) / ED Diagnoses Final diagnoses:  None    Rx / DC Orders ED Discharge Orders     None

## 2021-12-10 ENCOUNTER — Emergency Department (HOSPITAL_COMMUNITY): Payer: Medicare HMO

## 2021-12-10 DIAGNOSIS — Z833 Family history of diabetes mellitus: Secondary | ICD-10-CM | POA: Diagnosis not present

## 2021-12-10 DIAGNOSIS — R2689 Other abnormalities of gait and mobility: Secondary | ICD-10-CM | POA: Diagnosis not present

## 2021-12-10 DIAGNOSIS — F10129 Alcohol abuse with intoxication, unspecified: Secondary | ICD-10-CM | POA: Diagnosis present

## 2021-12-10 DIAGNOSIS — Z8349 Family history of other endocrine, nutritional and metabolic diseases: Secondary | ICD-10-CM | POA: Diagnosis not present

## 2021-12-10 DIAGNOSIS — D649 Anemia, unspecified: Secondary | ICD-10-CM | POA: Diagnosis not present

## 2021-12-10 DIAGNOSIS — E8729 Other acidosis: Secondary | ICD-10-CM | POA: Diagnosis present

## 2021-12-10 DIAGNOSIS — F1020 Alcohol dependence, uncomplicated: Secondary | ICD-10-CM | POA: Diagnosis present

## 2021-12-10 DIAGNOSIS — E039 Hypothyroidism, unspecified: Secondary | ICD-10-CM | POA: Diagnosis present

## 2021-12-10 DIAGNOSIS — Z681 Body mass index (BMI) 19 or less, adult: Secondary | ICD-10-CM | POA: Diagnosis not present

## 2021-12-10 DIAGNOSIS — Y908 Blood alcohol level of 240 mg/100 ml or more: Secondary | ICD-10-CM | POA: Diagnosis present

## 2021-12-10 DIAGNOSIS — Z91148 Patient's other noncompliance with medication regimen for other reason: Secondary | ICD-10-CM | POA: Diagnosis not present

## 2021-12-10 DIAGNOSIS — E43 Unspecified severe protein-calorie malnutrition: Secondary | ICD-10-CM | POA: Diagnosis present

## 2021-12-10 DIAGNOSIS — Z9842 Cataract extraction status, left eye: Secondary | ICD-10-CM | POA: Diagnosis not present

## 2021-12-10 DIAGNOSIS — G319 Degenerative disease of nervous system, unspecified: Secondary | ICD-10-CM | POA: Diagnosis present

## 2021-12-10 DIAGNOSIS — R296 Repeated falls: Secondary | ICD-10-CM | POA: Diagnosis present

## 2021-12-10 DIAGNOSIS — I10 Essential (primary) hypertension: Secondary | ICD-10-CM

## 2021-12-10 DIAGNOSIS — R262 Difficulty in walking, not elsewhere classified: Secondary | ICD-10-CM | POA: Diagnosis not present

## 2021-12-10 DIAGNOSIS — M6281 Muscle weakness (generalized): Secondary | ICD-10-CM | POA: Diagnosis not present

## 2021-12-10 DIAGNOSIS — Z79899 Other long term (current) drug therapy: Secondary | ICD-10-CM | POA: Diagnosis not present

## 2021-12-10 DIAGNOSIS — F10932 Alcohol use, unspecified with withdrawal with perceptual disturbance: Secondary | ICD-10-CM | POA: Diagnosis not present

## 2021-12-10 DIAGNOSIS — Z9841 Cataract extraction status, right eye: Secondary | ICD-10-CM | POA: Diagnosis not present

## 2021-12-10 DIAGNOSIS — Z961 Presence of intraocular lens: Secondary | ICD-10-CM | POA: Diagnosis present

## 2021-12-10 DIAGNOSIS — G894 Chronic pain syndrome: Secondary | ICD-10-CM | POA: Diagnosis not present

## 2021-12-10 DIAGNOSIS — F10139 Alcohol abuse with withdrawal, unspecified: Secondary | ICD-10-CM | POA: Diagnosis present

## 2021-12-10 DIAGNOSIS — J449 Chronic obstructive pulmonary disease, unspecified: Secondary | ICD-10-CM

## 2021-12-10 DIAGNOSIS — F17210 Nicotine dependence, cigarettes, uncomplicated: Secondary | ICD-10-CM | POA: Diagnosis present

## 2021-12-10 DIAGNOSIS — R1312 Dysphagia, oropharyngeal phase: Secondary | ICD-10-CM | POA: Diagnosis not present

## 2021-12-10 DIAGNOSIS — N400 Enlarged prostate without lower urinary tract symptoms: Secondary | ICD-10-CM | POA: Diagnosis not present

## 2021-12-10 DIAGNOSIS — R42 Dizziness and giddiness: Secondary | ICD-10-CM | POA: Diagnosis not present

## 2021-12-10 LAB — URINALYSIS, ROUTINE W REFLEX MICROSCOPIC
Bilirubin Urine: NEGATIVE
Glucose, UA: NEGATIVE mg/dL
Hgb urine dipstick: NEGATIVE
Ketones, ur: 80 mg/dL — AB
Leukocytes,Ua: NEGATIVE
Nitrite: NEGATIVE
Protein, ur: NEGATIVE mg/dL
Specific Gravity, Urine: 1.015 (ref 1.005–1.030)
pH: 5 (ref 5.0–8.0)

## 2021-12-10 LAB — CBC
HCT: 42 % (ref 39.0–52.0)
Hemoglobin: 14.1 g/dL (ref 13.0–17.0)
MCH: 31.5 pg (ref 26.0–34.0)
MCHC: 33.6 g/dL (ref 30.0–36.0)
MCV: 93.8 fL (ref 80.0–100.0)
Platelets: 105 10*3/uL — ABNORMAL LOW (ref 150–400)
RBC: 4.48 MIL/uL (ref 4.22–5.81)
RDW: 14.2 % (ref 11.5–15.5)
WBC: 5.9 10*3/uL (ref 4.0–10.5)
nRBC: 0 % (ref 0.0–0.2)

## 2021-12-10 LAB — COMPREHENSIVE METABOLIC PANEL
ALT: 23 U/L (ref 0–44)
AST: 58 U/L — ABNORMAL HIGH (ref 15–41)
Albumin: 3.6 g/dL (ref 3.5–5.0)
Alkaline Phosphatase: 100 U/L (ref 38–126)
Anion gap: 16 — ABNORMAL HIGH (ref 5–15)
BUN: 12 mg/dL (ref 8–23)
CO2: 23 mmol/L (ref 22–32)
Calcium: 9 mg/dL (ref 8.9–10.3)
Chloride: 99 mmol/L (ref 98–111)
Creatinine, Ser: 0.58 mg/dL — ABNORMAL LOW (ref 0.61–1.24)
GFR, Estimated: >60 mL/min (ref >60)
Glucose, Bld: 62 mg/dL — ABNORMAL LOW (ref 70–99)
Potassium: 4.3 mmol/L (ref 3.5–5.1)
Sodium: 138 mmol/L (ref 135–145)
Total Bilirubin: 0.8 mg/dL (ref 0.3–1.2)
Total Protein: 6.8 g/dL (ref 6.5–8.1)

## 2021-12-10 LAB — ETHANOL: Alcohol, Ethyl (B): 299 mg/dL — ABNORMAL HIGH (ref <10)

## 2021-12-10 LAB — RAPID URINE DRUG SCREEN, HOSP PERFORMED
Amphetamines: NOT DETECTED
Barbiturates: NOT DETECTED
Benzodiazepines: NOT DETECTED
Cocaine: NOT DETECTED
Opiates: NOT DETECTED
Tetrahydrocannabinol: NOT DETECTED

## 2021-12-10 LAB — CBG MONITORING, ED: Glucose-Capillary: 91 mg/dL (ref 70–99)

## 2021-12-10 MED ORDER — THIAMINE HCL 100 MG/ML IJ SOLN: 100.0000 mg | Freq: Every day | INTRAMUSCULAR | Status: DC

## 2021-12-10 MED ORDER — THIAMINE HCL 100 MG PO TABS: 100.0000 mg | ORAL_TABLET | Freq: Every day | ORAL | Status: DC

## 2021-12-10 MED ORDER — MORPHINE SULFATE (PF) 2 MG/ML IV SOLN: 2.0000 mg | INTRAVENOUS | Status: DC | PRN

## 2021-12-10 MED ORDER — ENSURE ENLIVE PO LIQD
237.0000 mL | Freq: Two times a day (BID) | ORAL | Status: DC
Start: 2021-12-10 — End: 2021-12-13
  Administered 2021-12-10 – 2021-12-12 (×5): 237 mL via ORAL

## 2021-12-10 MED ORDER — AMLODIPINE BESYLATE 5 MG PO TABS
10.0000 mg | ORAL_TABLET | Freq: Every day | ORAL | Status: DC
  Administered 2021-12-10 – 2021-12-12 (×3): 10 mg via ORAL
  Filled 2021-12-10 (×4): qty 2

## 2021-12-10 MED ORDER — SODIUM CHLORIDE 0.9 % IV BOLUS
500.0000 mL | Freq: Once | INTRAVENOUS | Status: AC
  Administered 2021-12-10: 500 mL via INTRAVENOUS

## 2021-12-10 MED ORDER — VITAMIN B-12 1000 MCG PO TABS
1000.0000 ug | ORAL_TABLET | Freq: Every day | ORAL | Status: DC
  Administered 2021-12-10 – 2021-12-13 (×4): 1000 ug via ORAL
  Filled 2021-12-10 (×4): qty 1

## 2021-12-10 MED ORDER — IPRATROPIUM-ALBUTEROL 0.5-2.5 (3) MG/3ML IN SOLN
3.0000 mL | Freq: Three times a day (TID) | RESPIRATORY_TRACT | Status: DC
Start: 2021-12-10 — End: 2021-12-13
  Administered 2021-12-10 – 2021-12-13 (×10): 3 mL via RESPIRATORY_TRACT
  Filled 2021-12-10 (×10): qty 3

## 2021-12-10 MED ORDER — TAMSULOSIN HCL 0.4 MG PO CAPS
0.4000 mg | ORAL_CAPSULE | Freq: Every day | ORAL | Status: DC
  Administered 2021-12-10 – 2021-12-12 (×3): 0.4 mg via ORAL
  Filled 2021-12-10 (×3): qty 1

## 2021-12-10 MED ORDER — LORAZEPAM 1 MG PO TABS: 0.0000 mg | ORAL_TABLET | Freq: Four times a day (QID) | ORAL | Status: DC

## 2021-12-10 MED ORDER — PANTOPRAZOLE SODIUM 40 MG PO TBEC
40.0000 mg | DELAYED_RELEASE_TABLET | Freq: Every day | ORAL | Status: DC
  Administered 2021-12-10 – 2021-12-13 (×4): 40 mg via ORAL
  Filled 2021-12-10 (×4): qty 1

## 2021-12-10 MED ORDER — TAB-A-VITE/IRON PO TABS
1.0000 | ORAL_TABLET | Freq: Every day | ORAL | Status: DC
Start: 2021-12-10 — End: 2021-12-13
  Administered 2021-12-10 – 2021-12-13 (×4): 1 via ORAL
  Filled 2021-12-10 (×4): qty 1

## 2021-12-10 MED ORDER — LORAZEPAM 1 MG PO TABS: 0.0000 mg | ORAL_TABLET | Freq: Two times a day (BID) | ORAL | Status: DC

## 2021-12-10 MED ORDER — ACETAMINOPHEN 325 MG PO TABS: 650.0000 mg | ORAL_TABLET | Freq: Four times a day (QID) | ORAL | Status: DC | PRN

## 2021-12-10 MED ORDER — LORAZEPAM 2 MG/ML IJ SOLN
1.0000 mg | Freq: Once | INTRAMUSCULAR | Status: AC
  Administered 2021-12-10: 1 mg via INTRAVENOUS
  Filled 2021-12-10: qty 1

## 2021-12-10 MED ORDER — OXYCODONE HCL 5 MG PO TABS
5.0000 mg | ORAL_TABLET | Freq: Four times a day (QID) | ORAL | Status: DC | PRN
  Administered 2021-12-12 (×2): 5 mg via ORAL
  Filled 2021-12-10 (×2): qty 1

## 2021-12-10 MED ORDER — LORAZEPAM 2 MG/ML IJ SOLN
0.0000 mg | Freq: Four times a day (QID) | INTRAMUSCULAR | Status: DC
  Administered 2021-12-10: 2 mg via INTRAVENOUS
  Filled 2021-12-10: qty 1

## 2021-12-10 MED ORDER — LORAZEPAM 2 MG/ML IJ SOLN: 0.0000 mg | Freq: Two times a day (BID) | INTRAMUSCULAR | Status: DC

## 2021-12-10 MED ORDER — VITAMIN D 25 MCG (1000 UNIT) PO TABS
1000.0000 [IU] | ORAL_TABLET | Freq: Every day | ORAL | Status: DC
  Administered 2021-12-10 – 2021-12-13 (×4): 1000 [IU] via ORAL
  Filled 2021-12-10 (×4): qty 1

## 2021-12-10 MED ORDER — ONDANSETRON HCL 4 MG/2ML IJ SOLN: 4.0000 mg | Freq: Four times a day (QID) | INTRAMUSCULAR | Status: DC | PRN

## 2021-12-10 MED ORDER — BOOST / RESOURCE BREEZE PO LIQD CUSTOM
1.0000 | Freq: Two times a day (BID) | ORAL | Status: DC
  Administered 2021-12-11 – 2021-12-13 (×4): 1 via ORAL

## 2021-12-10 MED ORDER — OXYCODONE HCL 5 MG PO TABS
5.0000 mg | ORAL_TABLET | Freq: Four times a day (QID) | ORAL | Status: DC | PRN
  Administered 2021-12-10: 5 mg via ORAL
  Filled 2021-12-10: qty 1

## 2021-12-10 MED ORDER — METOPROLOL TARTRATE 25 MG PO TABS
25.0000 mg | ORAL_TABLET | Freq: Two times a day (BID) | ORAL | Status: DC
Start: 2021-12-10 — End: 2021-12-13
  Administered 2021-12-10 – 2021-12-12 (×6): 25 mg via ORAL
  Filled 2021-12-10 (×7): qty 1

## 2021-12-10 MED ORDER — THIAMINE HCL 100 MG PO TABS
250.0000 mg | ORAL_TABLET | Freq: Two times a day (BID) | ORAL | Status: DC
Start: 2021-12-10 — End: 2021-12-13
  Administered 2021-12-10 – 2021-12-13 (×7): 250 mg via ORAL
  Filled 2021-12-10 (×2): qty 2.5
  Filled 2021-12-10 (×6): qty 3
  Filled 2021-12-10 (×2): qty 2.5
  Filled 2021-12-10: qty 3
  Filled 2021-12-10: qty 2.5

## 2021-12-10 MED ORDER — CLONIDINE HCL 0.1 MG PO TABS: 0.1000 mg | ORAL_TABLET | Freq: Three times a day (TID) | ORAL | Status: DC

## 2021-12-10 MED ORDER — ACETAMINOPHEN 650 MG RE SUPP: 650.0000 mg | Freq: Four times a day (QID) | RECTAL | Status: DC | PRN

## 2021-12-10 MED ORDER — DEXTROSE-NACL 5-0.9 % IV SOLN: INTRAVENOUS | Status: DC

## 2021-12-10 MED ORDER — ONDANSETRON HCL 4 MG PO TABS: 4.0000 mg | ORAL_TABLET | Freq: Four times a day (QID) | ORAL | Status: DC | PRN

## 2021-12-10 MED ORDER — ENOXAPARIN SODIUM 40 MG/0.4ML IJ SOSY: 40.0000 mg | PREFILLED_SYRINGE | INTRAMUSCULAR | Status: DC

## 2021-12-10 NOTE — Evaluation (Signed)
Physical Therapy Evaluation Patient Details Name: Larry [name] MRN: [id number] DOB: January 22, 1951 Today's Date: 12/10/2021  History of Present Illness  Larry [name] is a 71 y.o. male with medical history significant of alcohol abuse, hypertension and COPD who presented with generalized weakness and dizziness.   EMS found in him in his bed with liquor bottles in his bedroom. He was transported to the ED, where he was noted to have balls of feces coming out of his pants, when is clothing was removed.   Apparently patient has been drinking liquor daily for the last month, he has been trying to stop, but developed withdrawal symptoms. He has not been able to walk or eat for at least 6 days. He reports multiple falls at home.   Currently he reports constant and severe dizziness, worse with movement, associated with poor balance, and no improving symptoms.   Clinical Impression  Patient limited for functional mobility as stated below secondary to BLE weakness, fatigue and poor standing balance. Patient with slow, labored transition to seated EOB requiring assist and cueing due to weakness. He demonstrates fair/good sitting balance and is limited by fatigue. Patient will benefit from continued physical therapy in hospital and recommended venue below to increase strength, balance, endurance for safe ADLs and gait.        Recommendations for follow up therapy are one component of a multi-disciplinary discharge planning process, led by the attending physician.  Recommendations may be updated based on patient status, additional functional criteria and insurance authorization.  Follow Up Recommendations Skilled nursing-short term rehab (<3 hours/day) Can patient physically be transported by private vehicle: No    Assistance Recommended at Discharge Intermittent Supervision/Assistance  Patient can return home with the following  A lot of help with walking and/or transfers;A lot of help with  bathing/dressing/bathroom;Assistance with cooking/housework;Assist for transportation;Help with stairs or ramp for entrance    Equipment Recommendations    Recommendations for Other Services       Functional Status Assessment Patient has had a recent decline in their functional status and demonstrates the ability to make significant improvements in function in a reasonable and predictable amount of time.     Precautions / Restrictions Precautions Precautions: Fall Restrictions Weight Bearing Restrictions: No      Mobility  Bed Mobility Overal bed mobility: Needs Assistance Bed Mobility: Supine to Sit, Sit to Supine     Supine to sit: Min assist Sit to supine: Min assist   General bed mobility comments: slow, labored transition to seated EOB with cueing    Transfers                        Ambulation/Gait                  Stairs            Wheelchair Mobility    Modified Rankin (Stroke Patients Only)       Balance Overall balance assessment: Needs assistance Sitting-balance support: Feet supported, No upper extremity supported Sitting balance-Leahy Scale: Fair Sitting balance - Comments: good/fair seated EOB                                     Pertinent Vitals/Pain Pain Assessment Pain Assessment: Faces Faces Pain Scale: Hurts little more Pain Location: general soreness Pain Descriptors / Indicators: Sore Pain Intervention(s): Limited activity within patient's tolerance, Monitored  during session, Repositioned    Home Living Family/patient expects to be discharged to:: Private residence Living Arrangements: Alone   Type of Home: Mobile home Home Access: Stairs to enter Entrance Stairs-Rails: Can reach both;Right;Left Entrance Stairs-Number of Steps: 4   Home Layout: One level Home Equipment: Agricultural consultant (2 wheels)      Prior Function Prior Level of Function : Needs assist             Mobility  Comments: Patient reports limited ambulation at home with leaning on furniture for ambulating throughout household ADLs Comments: states independent     Hand Dominance        Extremity/Trunk Assessment   Upper Extremity Assessment Upper Extremity Assessment: Generalized weakness    Lower Extremity Assessment Lower Extremity Assessment: Generalized weakness    Cervical / Trunk Assessment Cervical / Trunk Assessment: Kyphotic  Communication   Communication: No difficulties  Cognition Arousal/Alertness: Awake/alert Behavior During Therapy: WFL for tasks assessed/performed Overall Cognitive Status: Within Functional Limits for tasks assessed                                          General Comments      Exercises     Assessment/Plan    PT Assessment Patient needs continued PT services  PT Problem List Decreased strength;Decreased mobility;Decreased activity tolerance;Decreased balance       PT Treatment Interventions DME instruction;Therapeutic exercise;Gait training;Balance training;Stair training;Neuromuscular re-education;Functional mobility training;Therapeutic activities;Patient/family education    PT Goals (Current goals can be found in the Care Plan section)  Acute Rehab PT Goals Patient Stated Goal: get better PT Goal Formulation: With patient Time For Goal Achievement: 12/24/21 Potential to Achieve Goals: Fair    Frequency Min 3X/week     Co-evaluation               AM-PAC PT "6 Clicks" Mobility  Outcome Measure Help needed turning from your back to your side while in a flat bed without using bedrails?: None Help needed moving from lying on your back to sitting on the side of a flat bed without using bedrails?: A Little Help needed moving to and from a bed to a chair (including a wheelchair)?: A Lot Help needed standing up from a chair using your arms (e.g., wheelchair or bedside chair)?: A Lot Help needed to walk in hospital  room?: A Lot Help needed climbing 3-5 steps with a railing? : A Lot 6 Click Score: 15    End of Session   Activity Tolerance: Patient limited by fatigue Patient left: in bed;with call bell/phone within reach;with bed alarm set Nurse Communication: Mobility status PT Visit Diagnosis: Unsteadiness on feet (R26.81);Other abnormalities of gait and mobility (R26.89);Muscle weakness (generalized) (M62.81);Repeated falls (R29.6)    Time: 9833-8250 PT Time Calculation (min) (ACUTE ONLY): 10 min   Charges:   PT Evaluation $PT Eval Low Complexity: 1 Low          12:13 PM, 12/10/21 Wyman Songster PT, DPT Physical Therapist at [facility]

## 2021-12-10 NOTE — Assessment & Plan Note (Addendum)
Acute alcoholic intoxication.  Patient will be placed on remote telemetry monitoring.   Continue benzodiazepines with lorazepam per CIWA protocol. Neuro checks per protocol. Regular diet with aspiration precautions. IV fluids with isotonic saline and dextrose.  High dose thiamine 250 mg bid for 5 days.  PT and OT

## 2021-12-10 NOTE — Assessment & Plan Note (Signed)
Continue with nutritional supplements and vitamins.

## 2021-12-10 NOTE — ED Notes (Signed)
Pt was given orange juice to drink

## 2021-12-10 NOTE — Assessment & Plan Note (Signed)
No signs of exacerbation, continue with 02 monitoring and as needed bronchodilators.

## 2021-12-10 NOTE — Progress Notes (Signed)
Initial Nutrition Assessment RD working remotely.  DOCUMENTATION CODES:   Underweight  INTERVENTION:  - will order Boost Breeze po TID, each supplement provides 250 kcal and 9 grams of protein  - will order Ensure Enlive po BID, each supplement provides 350 kcal and 20 grams of protein.  - complete NFPE when feasible.   NUTRITION DIAGNOSIS:   Inadequate oral intake related to acute illness as evidenced by per patient/family report.  GOAL:   Patient will meet greater than or equal to 90% of their needs, Weight gain  MONITOR:   PO intake, Supplement acceptance, Labs, Weight trends  REASON FOR ASSESSMENT:   Consult Assessment of nutrition requirement/status  ASSESSMENT:   71 y.o. male with medical history of alcohol abuse, HTN, COPD, arthritis, and hypothyroidism. He presented to the ED due to generalized weakness and dizziness after being found by EMS in bed with liquor bottles in his room. It was reported that he had been drinking liquor daily for the past month and he had been unable to walk or eat x6 days and had multiple falls PTA.  The only documented meal completion percentage since admission was 0% of lunch today.   He was assessed by a [place] RD on 7/3, 7/5, and 7/10. At that time he met criteria for severe malnutrition related to social or environmental circumstances as evidenced by severe fat depletions and severe muscle depletions. Suspect this is ongoing.  Patient was intubated during that hospitalization and intakes after extubation were variable.  Weight today is 122 lb and weight on 7/9 was documented as 140 lb. This would indicate 18 lb weight loss (12.8% body weight) in the past 5 weeks; significant for time frame.    Labs reviewed; CBG: 91 mg/dl, creatinine: 0.58 mg/dl.  Medications reviewed; 1000 units cholecalciferol/day, 1 tablet multivitamin with iron/day, 40 mg oral protonix/day, 250 mg oral thiamine BID x5 days starting 8/19.  IVF; D5-NS @  100 ml/hr (408 kcal/24 hrs).    NUTRITION - FOCUSED PHYSICAL EXAM:  RD working remotely.  Diet Order:   Diet Order             Diet regular Room service appropriate? Yes; Fluid consistency: Thin  Diet effective now                   EDUCATION NEEDS:   No education needs have been identified at this time  Skin:  Skin Assessment: Reviewed RN Assessment  Last BM:  PTA/unknown  Height:   Ht Readings from Last 1 Encounters:  12/10/21 $RemoveB'6\' 2"'HGUysLmZ$  (1.88 m)    Weight:   Wt Readings from Last 1 Encounters:  12/10/21 55.4 kg     BMI:  Body mass index is 15.69 kg/m.  Estimated Nutritional Needs:  Kcal:  1900-2100 kcal Protein:  95-105 grams Fluid:  >/= 2.5 L/day      Jarome Matin, MS, RD, LDN, CNSC Registered Dietitian II Inpatient Clinical Nutrition RD pager # and on-call/weekend pager # available in [facility]

## 2021-12-10 NOTE — ED Notes (Signed)
Assisted pt with his cell phone.Pt laying on left side, curled in a ball. Asked pt if he was comfortable in that position, he said he was and did not want to be adjusted.

## 2021-12-10 NOTE — H&P (Signed)
History and Physical    Patient: Larry [name] YNW:[id number] DOB: [birth year] DOA: 12/09/2021 DOS: the patient was seen and examined on 12/10/2021 PCP: Benita Stabile, MD  Patient coming from: Home  Chief Complaint:  Chief Complaint  Patient presents with   Dizziness   HPI: Larry [name] is a 71 y.o. male with medical history significant of alcohol abuse, hypertension and COPD who presented with generalized weakness and dizziness.  EMS found in him in his bed with liquor bottles in his bedroom. He was transported to the ED, where he was noted to have balls of feces coming out of his pants, when is clothing was removed.  Apparently patient has been drinking liquor daily for the last month, he has been trying to stop, but developed withdrawal symptoms. He has not been able to walk or eat for at least 6 days. He reports multiple falls at home.  Currently he reports constant and severe dizziness, worse with movement, associated with poor balance, and no improving symptoms.   Recent hospitalization 06/22 to 11/02/21 for uncontrolled pain, due to ribs and L12  fractures.  At home he lives alone.   Review of Systems: As mentioned in the history of present illness. All other systems reviewed and are negative. 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 He has  been smoking an average of 1 pack per day. He has never used smokeless tobacco. He reports current alcohol use of about 10.0 standard drinks of alcohol per week. He reports that he does not use drugs.  No Known Allergies  Family History  Problem Relation Age of Onset   Thyroid disease Mother    Diabetes Other     Prior to Admission medications   Medication Sig Start Date End Date Taking? Authorizing Provider  acetaminophen (TYLENOL) 325 MG tablet Take 2 tablets (650 mg total) by mouth every 6 (six) hours as needed for headache, mild pain or fever. 11/01/21 11/01/22  Vassie Loll, MD  cholecalciferol (VITAMIN D3) 25 MCG (1000 UNIT) tablet Take 1,000 Units by mouth daily.    [provider]  cloNIDine (CATAPRES) 0.1 MG tablet Take 1 tablet (0.1 mg total) by mouth 3 (three) times daily. 11/01/21   Vassie Loll, MD  cyanocobalamin 1000 MCG tablet Take 1 tablet (1,000 mcg total) by mouth daily. 03/15/21   Shon Hale, MD  folic acid (FOLVITE) 1 MG tablet Take 1 tablet (1 mg total) by mouth daily. Patient not taking: Reported on 10/13/2021 03/16/21   Shon Hale, MD  ipratropium-albuterol (DUONEB) 0.5-2.5 (3) MG/3ML SOLN Take 3 mLs by nebulization 3 (three) times daily. 11/01/21   Vassie Loll, MD  Multiple Vitamin (MULTIVITAMIN WITH MINERALS) TABS tablet Take 1 tablet by mouth daily. Patient not taking: Reported on 10/13/2021  03/16/21   Shon Hale, MD  oxyCODONE (OXY IR/ROXICODONE) 5 MG immediate release tablet Take 1 tablet (5 mg total) by mouth every 6 (six) hours as needed for severe pain. 11/01/21   Vassie Loll, MD  pantoprazole (PROTONIX) 40 MG tablet Take 1 tablet (40 mg total) by mouth daily. 11/01/21 11/01/22  Vassie Loll, MD  tamsulosin (FLOMAX) 0.4 MG CAPS capsule Take 1 capsule (0.4 mg total) by mouth daily after supper. 11/02/21   Vassie Loll, MD  thiamine 100 MG tablet Take 1 tablet (100 mg total) by mouth daily. 03/15/21   Shon Hale, MD     Physical Exam: Vitals:   12/10/21 0711 12/10/21 0721 12/10/21 0730 12/10/21 0818  BP:  (!) 113/54 121/66 (!) 142/69  Pulse:  93 94 97  Resp:   15 17  Temp: 98.1 F (36.7 C)   98.3 F (36.8 C)  TempSrc: Oral     SpO2:   92% 97%  Weight:    55.4 kg  Height:    6\' 2"  (1.88 m)   Neurology awake and alert, deconditioned and ill looking appearing, positive tremors bilateral hands.  ENT with pallor but not icterus. Cardiovascular with S1 and S2 present and rhythmic with no gallops, rubs or murmurs Respiratory with no rales or wheezing, no rhonchi Abdomen with no distention or tenderness No lower extremity edema No rashes or wounds   Data Reviewed:   71 yo male with past medical history of alcohol abuse disorder, and severe calorie protein malnutrition who presented with severe dizziness and generalized weakness. On his physical examination is very deconditioned and ill looking appearing, positive tremors bilateral hands.   Na 138, K 4,3 Cl 99, bicarbonate 23, glucose 62, bun 12 cr 0,58 anion gap 16  AST 58 and ALT 23 Wbc 5,9 hgb 14.1 plt 105  Alcohol level 299  Urine analysis  SG 1,015, negative proteins, negative leukocytes.    Head CT with no acute changes, generalized cerebral atrophy and microvascular disease changes of the supratentorial brain.   EKG 87 bpm, left axis deviation, left anteriora fascicular block, right bundle branch block, qtc 500, sinus rhythm with no significant ST segment or T wave changes.    Assessment and Plan: * Alcohol withdrawal (HCC) Acute alcoholic intoxication.  Patient will be placed on remote telemetry monitoring.   Continue benzodiazepines with lorazepam per CIWA protocol. Neuro checks per protocol. Regular diet with aspiration precautions. IV fluids with isotonic saline and dextrose.  High dose thiamine 250 mg bid for 5 days.  PT and OT   Alcoholic ketoacidosis Anion gap on admission is 16.  Plan to continue supportive medical  therapy with isotonic IV fluids with dextrose at 100 ml per hr Follow up electrolytes and renal function.   COPD (chronic obstructive pulmonary disease) (HCC) No signs of exacerbation, continue with 02 monitoring and as needed bronchodilators.   Hypertension Will transition to amlodipine from clonidine. Poor compliance with clonidine can trigger rebound hypertension.   Protein-calorie malnutrition, severe (HCC) Consult nutrition Add multivitamins.       Advance Care Planning:   Code Status: Full Code   Consults: none   Family Communication: none   Severity of Illness: The appropriate patient status for this patient is INPATIENT. Inpatient status is judged to be reasonable and necessary in order to provide the required intensity of service to ensure the patient's safety. The patient's presenting symptoms, physical exam findings, and initial radiographic and laboratory data in the context of their chronic  comorbidities is felt to place them at high risk for further clinical deterioration. Furthermore, it is not anticipated that the patient will be medically stable for discharge from the hospital within 2 midnights of admission.   * I certify that at the point of admission it is my clinical judgment that the patient will require inpatient hospital care spanning beyond 2 midnights from the point of admission due to high intensity of service, high risk for further deterioration and high frequency of surveillance required.*  Author: Coralie Keens, MD 12/10/2021 8:30 AM  For on call review [email or web address].

## 2021-12-10 NOTE — Plan of Care (Signed)
  Problem: Acute Rehab PT Goals(only PT should resolve) Goal: Pt Will Go Supine/Side To Sit Outcome: Progressing Flowsheets (Taken 12/10/2021 1214) Pt will go Supine/Side to Sit:  with supervision  with min guard assist Goal: Pt Will Go Sit To Supine/Side Outcome: Progressing Flowsheets (Taken 12/10/2021 1214) Pt will go Sit to Supine/Side:  with supervision  with min guard assist Goal: Patient Will Transfer Sit To/From Stand Outcome: Progressing Flowsheets (Taken 12/10/2021 1214) Patient will transfer sit to/from stand: with minimal assist Goal: Pt Will Transfer Bed To Chair/Chair To Bed Outcome: Progressing Flowsheets (Taken 12/10/2021 1214) Pt will Transfer Bed to Chair/Chair to Bed: with min assist Goal: Pt Will Ambulate Outcome: Progressing Flowsheets (Taken 12/10/2021 1214) Pt will Ambulate:  15 feet  with minimal assist  with least restrictive assistive device Goal: Pt/caregiver will Perform Home Exercise Program Outcome: Progressing Flowsheets (Taken 12/10/2021 1214) Pt/caregiver will Perform Home Exercise Program:  For increased strengthening  For improved balance  Independently  12:15 PM, 12/10/21 Wyman Songster PT, DPT Physical Therapist at [facility]

## 2021-12-10 NOTE — Discharge Instructions (Addendum)
It was our pleasure to provide your ER care today - we hope that you feel better.  Avoid alcohol use as it is harmful to your physical health and mental well-being.  See resource guide for alcohol treatment programs.   Also follow up closely with primary care doctor in the coming week.  Return to ER if worse, new symptoms, fevers, chest pain, trouble breathing, or other emergency concern.

## 2021-12-10 NOTE — Assessment & Plan Note (Addendum)
Will transition to amlodipine from clonidine. Poor compliance with clonidine can trigger rebound hypertension.

## 2021-12-10 NOTE — Assessment & Plan Note (Signed)
Renal function with serum cr at 0,59, K is 3,5 serum bicarbonate 27.   Will add Kcl 40 meq x2  Follow up renal function in am.

## 2021-12-11 DIAGNOSIS — F10932 Alcohol use, unspecified with withdrawal with perceptual disturbance: Secondary | ICD-10-CM | POA: Diagnosis not present

## 2021-12-11 DIAGNOSIS — I10 Essential (primary) hypertension: Secondary | ICD-10-CM | POA: Diagnosis not present

## 2021-12-11 DIAGNOSIS — E8729 Other acidosis: Secondary | ICD-10-CM | POA: Diagnosis not present

## 2021-12-11 DIAGNOSIS — J449 Chronic obstructive pulmonary disease, unspecified: Secondary | ICD-10-CM | POA: Diagnosis not present

## 2021-12-11 LAB — BASIC METABOLIC PANEL
Anion gap: 7 (ref 5–15)
BUN: 11 mg/dL (ref 8–23)
CO2: 27 mmol/L (ref 22–32)
Calcium: 8.7 mg/dL — ABNORMAL LOW (ref 8.9–10.3)
Chloride: 102 mmol/L (ref 98–111)
Creatinine, Ser: 0.59 mg/dL — ABNORMAL LOW (ref 0.61–1.24)
GFR, Estimated: >60 mL/min (ref >60)
Glucose, Bld: 129 mg/dL — ABNORMAL HIGH (ref 70–99)
Potassium: 3.5 mmol/L (ref 3.5–5.1)
Sodium: 136 mmol/L (ref 135–145)

## 2021-12-11 LAB — CBC
HCT: 30.9 % — ABNORMAL LOW (ref 39.0–52.0)
Hemoglobin: 10.5 g/dL — ABNORMAL LOW (ref 13.0–17.0)
MCH: 31.8 pg (ref 26.0–34.0)
MCHC: 34 g/dL (ref 30.0–36.0)
MCV: 93.6 fL (ref 80.0–100.0)
Platelets: 75 10*3/uL — ABNORMAL LOW (ref 150–400)
RBC: 3.3 MIL/uL — ABNORMAL LOW (ref 4.22–5.81)
RDW: 14.3 % (ref 11.5–15.5)
WBC: 5.2 10*3/uL (ref 4.0–10.5)
nRBC: 0 % (ref 0.0–0.2)

## 2021-12-11 MED ORDER — LORAZEPAM 1 MG PO TABS: 1.0000 mg | ORAL_TABLET | ORAL | Status: DC | PRN

## 2021-12-11 MED ORDER — POTASSIUM CHLORIDE CRYS ER 20 MEQ PO TBCR
40.0000 meq | EXTENDED_RELEASE_TABLET | ORAL | Status: AC
  Administered 2021-12-11 (×2): 40 meq via ORAL
  Filled 2021-12-11 (×2): qty 2

## 2021-12-11 MED ORDER — LORAZEPAM 2 MG/ML IJ SOLN
1.0000 mg | INTRAMUSCULAR | Status: DC | PRN
  Administered 2021-12-11: 1 mg via INTRAVENOUS
  Administered 2021-12-11: 2 mg via INTRAVENOUS
  Filled 2021-12-11 (×2): qty 1

## 2021-12-11 NOTE — Progress Notes (Signed)
  Progress Note   Patient: Larry [name] [id number]:[id number] DOB: 02-12-1951 DOA: 12/09/2021     1 DOS: the patient was seen and examined on 12/11/2021   Brief hospital course: Larry [name] was admitted to the hospital with the working diagnosis of alcohol withdrawal syndrome.   71 yo male with past medical history of alcohol abuse disorder, and severe calorie protein malnutrition who presented with severe dizziness and generalized weakness. On his physical examination is very deconditioned and ill looking appearing, positive tremors bilateral hands.    Na 138, K 4,3 Cl 99, bicarbonate 23, glucose 62, bun 12 cr 0,58 anion gap 16  AST 58 and ALT 23 Wbc 5,9 hgb 14.1 plt 105  Alcohol level 299  Urine analysis  SG 1,015, negative proteins, negative leukocytes.     Head CT with no acute changes, generalized cerebral atrophy and microvascular disease changes of the supratentorial brain.    EKG 87 bpm, left axis deviation, left anteriora fascicular block, right bundle branch block, qtc 500, sinus rhythm with no significant ST segment or T wave changes.   Patient was placed on CIWA lorazepam per protocol  Supportive medical therapy.   Assessment and Plan: * Alcohol withdrawal (HCC) Acute alcoholic intoxication.  Clinically improving.   Plan to continue with as needed lorazepam per CIWA protocol.   Patient with improved po intake. Plan to hold IV fluids Follow up with PT and OT Continue with vitamins and high dose thiamine.    Alcoholic ketoacidosis Renal function with serum cr at 0,59, K is 3,5 serum bicarbonate 27.   Will add Kcl 40 meq x2  Follow up renal function in am.   COPD (chronic obstructive pulmonary disease) (HCC) No signs of exacerbation, continue with 02 monitoring and as needed bronchodilators.   Hypertension Blood pressure has improved, systolic has been 130 to 140.  Plan to continue with amlodipine and metoprolol.  Will avoid clonidine that can result in rebound  hypertension in case of non compliance.   Protein-calorie malnutrition, severe (HCC) Continue with nutritional supplements and vitamins.         Subjective: Patient is feeling better, his tremors have improved but not yet back to baseline, he has not been out of bed yet.   Physical Exam: Vitals:   12/10/21 1953 12/11/21 0005 12/11/21 0508 12/11/21 0914  BP: 137/78 129/70 133/76 (!) 147/85  Pulse: 80 84 77 78  Resp: 16 17 17    Temp: 98.4 F (36.9 C) 98.7 F (37.1 C) 98.5 F (36.9 C)   TempSrc: Oral Oral Oral   SpO2: 95% 97% 100%   Weight:      Height:       Neurology awake and alert, fine tremors in his hands ENT with mild pallor Cardiovascular with S1 and S2 present and rhythmic Respiratory with no rales or wheezing Abdomen with no distention  No lower extremity edema  Data Reviewed:    Family Communication: I spoke with patient's wife at the bedside, we talked in detail about patient's condition, plan of care and prognosis and all questions were addressed.   Disposition: Status is: Inpatient Remains inpatient appropriate because: alcohol withdrawal   Planned Discharge Destination: Skilled nursing facility    Author: , MD 12/11/2021 2:07 PM  For on call review [email or web address].

## 2021-12-11 NOTE — Hospital Course (Signed)
Mr. Syme was admitted to the hospital with the working diagnosis of alcohol withdrawal syndrome.   71 yo male with past medical history of alcohol abuse disorder, and severe calorie protein malnutrition who presented with severe dizziness and generalized weakness. On his physical examination is very deconditioned and ill looking appearing, positive tremors bilateral hands.    Na 138, K 4,3 Cl 99, bicarbonate 23, glucose 62, bun 12 cr 0,58 anion gap 16  AST 58 and ALT 23 Wbc 5,9 hgb 14.1 plt 105  Alcohol level 299  Urine analysis  SG 1,015, negative proteins, negative leukocytes.     Head CT with no acute changes, generalized cerebral atrophy and microvascular disease changes of the supratentorial brain.    EKG 87 bpm, left axis deviation, left anteriora fascicular block, right bundle branch block, qtc 500, sinus rhythm with no significant ST segment or T wave changes.   Patient was placed on CIWA lorazepam per protocol  Supportive medical therapy.

## 2021-12-12 DIAGNOSIS — F10932 Alcohol use, unspecified with withdrawal with perceptual disturbance: Secondary | ICD-10-CM | POA: Diagnosis not present

## 2021-12-12 LAB — BASIC METABOLIC PANEL
Anion gap: 4 — ABNORMAL LOW (ref 5–15)
BUN: 5 mg/dL — ABNORMAL LOW (ref 8–23)
CO2: 27 mmol/L (ref 22–32)
Calcium: 8.7 mg/dL — ABNORMAL LOW (ref 8.9–10.3)
Chloride: 103 mmol/L (ref 98–111)
Creatinine, Ser: 0.47 mg/dL — ABNORMAL LOW (ref 0.61–1.24)
GFR, Estimated: >60 mL/min (ref >60)
Glucose, Bld: 95 mg/dL (ref 70–99)
Potassium: 4.2 mmol/L (ref 3.5–5.1)
Sodium: 134 mmol/L — ABNORMAL LOW (ref 135–145)

## 2021-12-12 MED ORDER — AMLODIPINE BESYLATE 10 MG PO TABS: 10.0000 mg | ORAL_TABLET | Freq: Every day | ORAL | 0 refills | Status: DC

## 2021-12-12 MED ORDER — METOPROLOL TARTRATE 25 MG PO TABS: 25.0000 mg | ORAL_TABLET | Freq: Two times a day (BID) | ORAL | 0 refills | Status: DC

## 2021-12-12 MED ORDER — ENSURE ENLIVE PO LIQD: 237.0000 mL | Freq: Two times a day (BID) | ORAL | 12 refills | Status: DC

## 2021-12-12 MED ORDER — FLUTICASONE PROPIONATE 50 MCG/ACT NA SUSP
1.0000 | Freq: Every day | NASAL | Status: DC
  Administered 2021-12-12 – 2021-12-13 (×2): 1 via NASAL
  Filled 2021-12-12 (×2): qty 16

## 2021-12-12 MED ORDER — OXYCODONE HCL 5 MG PO TABS
5.0000 mg | ORAL_TABLET | Freq: Four times a day (QID) | ORAL | 0 refills | Status: DC | PRN
Start: 2021-12-12 — End: 2023-02-04

## 2021-12-12 MED ORDER — CHLORDIAZEPOXIDE HCL 5 MG PO CAPS: 5.0000 mg | ORAL_CAPSULE | Freq: Three times a day (TID) | ORAL | 0 refills | Status: DC | PRN

## 2021-12-12 NOTE — Discharge Summary (Signed)
Physician Discharge Summary  Larry [name] [id number]:[id number] DOB: 14-Aug-1950 DOA: 12/09/2021  PCP: Benita Stabile, MD  Admit date: 12/09/2021  Discharge date: 12/13/2021  Admitted From:Home  Disposition:  SNF  Recommendations for Outpatient Follow-up:  Follow up with PCP in 1-2 weeks Continue on Librium as needed for anxiety/withdrawal symptoms Continue other medications as noted below  Home Health: None  Equipment/Devices: None  Discharge Condition:Stable  CODE STATUS: Full  Diet recommendation: Heart Healthy  Brief/Interim Summary: 71 yo male with past medical history of alcohol abuse disorder, and severe calorie protein malnutrition who presented with severe dizziness and generalized weakness. Larry [name] was admitted to the hospital with the working diagnosis of alcohol withdrawal syndrome.  He was treated per CIWA protocol and monitor closely.  Blood pressure medications were adjusted and clonidine was discontinued as there was some concern of noncompliance.  He has been seen by physical therapy with recommendations for SNF on discharge as there were issues of safety regarding his discharge.  He is in stable condition at this point for discharge and has had no further acute events noted, but remains high risk for readmission as he has a tendency to return home after each rehabilitation stay where he tends to get into trouble with alcohol abuse.  Discharge Diagnoses:  Principal Problem:   Alcohol withdrawal (HCC) Active Problems:   Alcoholic ketoacidosis   COPD (chronic obstructive pulmonary disease) (HCC)   Hypertension   Protein-calorie malnutrition, severe (HCC)  Principal discharge diagnosis: Alcohol withdrawal symptoms in the setting of initial alcohol intoxication with ketoacidosis.  Discharge Instructions  Discharge Instructions     Diet - low sodium heart healthy   Complete by: As directed    Increase activity slowly   Complete by: As directed       Allergies as  of 12/13/2021   No Known Allergies      Medication List     STOP taking these medications    cloNIDine 0.1 MG tablet Commonly known as: CATAPRES   lisinopril 10 MG tablet Commonly known as: ZESTRIL       TAKE these medications    acetaminophen 325 MG tablet Commonly known as: Tylenol Take 2 tablets (650 mg total) by mouth every 6 (six) hours as needed for headache, mild pain or fever.   amLODipine 10 MG tablet Commonly known as: NORVASC Take 1 tablet (10 mg total) by mouth daily.   chlordiazePOXIDE 5 MG capsule Commonly known as: LIBRIUM Take 1 capsule (5 mg total) by mouth 3 (three) times daily as needed for anxiety or withdrawal.   cholecalciferol 25 MCG (1000 UNIT) tablet Commonly known as: VITAMIN D3 Take 1,000 Units by mouth daily.   cyanocobalamin 1000 MCG tablet Take 1 tablet (1,000 mcg total) by mouth daily.   feeding supplement Liqd Take 237 mLs by mouth 2 (two) times daily between meals.   folic acid 1 MG tablet Commonly known as: FOLVITE Take 1 tablet (1 mg total) by mouth daily.   ipratropium-albuterol 0.5-2.5 (3) MG/3ML Soln Commonly known as: DUONEB Take 3 mLs by nebulization 3 (three) times daily.   metoprolol tartrate 25 MG tablet Commonly known as: LOPRESSOR Take 1 tablet (25 mg total) by mouth 2 (two) times daily.   multivitamin with minerals Tabs tablet Take 1 tablet by mouth daily.   oxyCODONE 5 MG immediate release tablet Commonly known as: Oxy IR/ROXICODONE Take 1 tablet (5 mg total) by mouth every 6 (six) hours as needed for severe pain.   pantoprazole  40 MG tablet Commonly known as: Protonix Take 1 tablet (40 mg total) by mouth daily.   tamsulosin 0.4 MG Caps capsule Commonly known as: FLOMAX Take 1 capsule (0.4 mg total) by mouth daily after supper.   thiamine 100 MG tablet Commonly known as: VITAMIN B1 Take 1 tablet (100 mg total) by mouth daily.        Contact information for follow-up providers     Celene Squibb,  MD. Schedule an appointment as soon as possible for a visit in 1 week(s).   Specialty: Internal Medicine Contact information: [place] [address] [phone]              Contact information for after-discharge care     [facility] .   Service: Skilled Nursing Contact information: [place] [phone]                    No Known Allergies  Consultations: None   Procedures/Studies: CT HEAD WO CONTRAST (5MM)  Result Date: 12/10/2021 CLINICAL DATA:  Dizziness. EXAM: CT HEAD WITHOUT CONTRAST TECHNIQUE: Contiguous axial images were obtained from the base of the skull through the vertex without intravenous contrast. RADIATION DOSE REDUCTION: This exam was performed according to the departmental dose-optimization program which includes automated exposure control, adjustment of the mA and/or kV according to patient size and/or use of iterative reconstruction technique. COMPARISON:  October 13, 2021 FINDINGS: Brain: There is mild to moderate severity cerebral atrophy with widening of the extra-axial spaces and ventricular dilatation. There are areas of decreased attenuation within the white matter tracts of the supratentorial brain, consistent with microvascular disease changes. Vascular: There is calcification of the bilateral cavernous carotid arteries. Skull: Normal. Negative for fracture or focal lesion. Sinuses/Orbits: No acute finding. Other: None. IMPRESSION: 1. No acute intracranial abnormality. 2. Generalized cerebral atrophy and microvascular disease changes of the supratentorial brain. Electronically Signed   By: Virgina Norfolk M.D.   On: 12/10/2021 02:55     Discharge Exam: Vitals:   12/12/21 2100 12/13/21 0503  BP: (!) 116/59 122/73  Pulse: 90 87  Resp: 18 20  Temp: 98.1 F (36.7 C) 98.1 F (36.7 C)  SpO2: 96% 97%   Vitals:   12/12/21 1421  12/12/21 2011 12/12/21 2100 12/13/21 0503  BP:   (!) 116/59 122/73  Pulse:   90 87  Resp:   18 20  Temp:   98.1 F (36.7 C) 98.1 F (36.7 C)  TempSrc:    Oral  SpO2: 92% 94% 96% 97%  Weight:      Height:        General: Pt is alert, awake, not in acute distress Cardiovascular: RRR, S1/S2 +, no rubs, no gallops Respiratory: CTA bilaterally, no wheezing, no rhonchi Abdominal: Soft, NT, ND, bowel sounds + Extremities: no edema, no cyanosis    The results of significant diagnostics from this hospitalization (including imaging, microbiology, ancillary and laboratory) are listed below for reference.     Microbiology: No results found for this or any previous visit (from the past 240 hour(s)).   Labs: BNP (last 3 results) Recent Labs    02/06/21 1327 10/26/21 0411  BNP 174.0* 0000000*   Basic Metabolic Panel: Recent Labs  Lab 12/09/21 2337 12/11/21 0427 12/12/21 0529  NA 138 136 134*  K 4.3 3.5 4.2  CL 99 102 103  CO2 23 27 27   GLUCOSE  62* 129* 95  BUN 12 11 5*  CREATININE 0.58* 0.59* 0.47*  CALCIUM 9.0 8.7* 8.7*   Liver Function Tests: Recent Labs  Lab 12/09/21 2337  AST 58*  ALT 23  ALKPHOS 100  BILITOT 0.8  PROT 6.8  ALBUMIN 3.6   No results for input(s): "LIPASE", "AMYLASE" in the last 168 hours. No results for input(s): "AMMONIA" in the last 168 hours. CBC: Recent Labs  Lab 12/09/21 2338 12/11/21 0427  WBC 5.9 5.2  HGB 14.1 10.5*  HCT 42.0 30.9*  MCV 93.8 93.6  PLT 105* 75*   Cardiac Enzymes: No results for input(s): "CKTOTAL", "CKMB", "CKMBINDEX", "TROPONINI" in the last 168 hours. BNP: Invalid input(s): "POCBNP" CBG: Recent Labs  Lab 12/10/21 0339  GLUCAP 91   D-Dimer No results for input(s): "DDIMER" in the last 72 hours. Hgb A1c No results for input(s): "HGBA1C" in the last 72 hours. Lipid Profile No results for input(s): "CHOL", "HDL", "LDLCALC", "TRIG", "CHOLHDL", "LDLDIRECT" in the last 72 hours. Thyroid function studies No  results for input(s): "TSH", "T4TOTAL", "T3FREE", "THYROIDAB" in the last 72 hours.  Invalid input(s): "FREET3" Anemia work up No results for input(s): "VITAMINB12", "FOLATE", "FERRITIN", "TIBC", "IRON", "RETICCTPCT" in the last 72 hours. Urinalysis    Component Value Date/Time   COLORURINE YELLOW 12/09/2021 2307   APPEARANCEUR CLEAR 12/09/2021 2307   LABSPEC 1.015 12/09/2021 2307   PHURINE 5.0 12/09/2021 2307   GLUCOSEU NEGATIVE 12/09/2021 2307   HGBUR NEGATIVE 12/09/2021 2307   BILIRUBINUR NEGATIVE 12/09/2021 2307   KETONESUR 80 (A) 12/09/2021 2307   PROTEINUR NEGATIVE 12/09/2021 2307   NITRITE NEGATIVE 12/09/2021 2307   LEUKOCYTESUR NEGATIVE 12/09/2021 2307   Sepsis Labs Recent Labs  Lab 12/09/21 2338 12/11/21 0427  WBC 5.9 5.2   Microbiology No results found for this or any previous visit (from the past 240 hour(s)).   Time coordinating discharge: 35 minutes  SIGNED:   Erick Blinks, DO [facility] 12/13/2021, 8:49 AM  If 7PM-7AM, please contact night-coverage [email or web address]

## 2021-12-12 NOTE — [place] FL2 (Signed)
[place] MEDICAID FL2 LEVEL OF CARE SCREENING TOOL     IDENTIFICATION  Patient Name: Larry [name] Birthdate: 1951-04-20 Sex: male Admission Date (Current Location): 12/09/2021  [facility] and [place] Number:  [employer] and Address:  [facility],  [address] [place] [address]      Provider Number: [id number]  Attending Physician Name and Address:  Erick Blinks, DO  Relative Name and Phone Number:  Roddy,Dorothy (Spouse)   [phone]    Current Level of Care: Hospital Recommended Level of Care: Skilled Nursing Facility Prior Approval Number:    Date Approved/Denied:   PASRR Number: [id number]  Discharge Plan: SNF    Current Diagnoses: Patient Active Problem List   Diagnosis Date Noted   COPD (chronic obstructive pulmonary disease) (HCC)    Alcoholic ketoacidosis    Protein-calorie malnutrition, severe (HCC)    BPH (benign prostatic hyperplasia) 11/01/2021   Dysphagia 11/01/2021   Acute respiratory failure with hypoxia and hypercapnia (HCC) 10/24/2021   Possible Aspiration into airway 10/18/2021   Somnolence 10/16/2021   Hypomagnesemia 10/14/2021   Uncontrolled pain 10/13/2021   Failure to thrive in adult 10/13/2021   Lumbar transverse process fracture 10/13/2021   Alcohol abuse 10/13/2021   Severe hepatic steatosis 10/13/2021   Bedbug infestation 10/13/2021   Multiple rib fractures 10/13/2021   Macrocytic anemia 10/13/2021   Hyponatremia 10/13/2021   Alcohol intoxication (HCC) 03/09/2021   Alcohol withdrawal (HCC) 03/09/2021   Elevated troponin 03/09/2021   Physical deconditioning 03/09/2021   Elevated AST (SGOT) 03/09/2021   Thrombocytopenia (HCC) 03/09/2021   Elevated MCV 03/09/2021   High anion gap metabolic acidosis 03/09/2021   Dehydration 03/09/2021   Pneumoconiosis, coal, 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   Eye discharge 03/09/2021   Falls frequently 02/06/2021   Hypertension 02/06/2021   Proximal humerus fracture  09/24/2014    Orientation RESPIRATION BLADDER Height & Weight     Self, Time, Situation, Place  Normal Continent Weight: 55.4 kg Height:  6\' 2"  (188 cm)  BEHAVIORAL SYMPTOMS/MOOD NEUROLOGICAL BOWEL NUTRITION STATUS      Continent Diet (See DC summary)  AMBULATORY STATUS COMMUNICATION OF NEEDS Skin   Extensive Assist Verbally Normal                       Personal Care Assistance Level of Assistance  Bathing, Feeding, Dressing Bathing Assistance: Maximum assistance Feeding assistance: Limited assistance Dressing Assistance: Maximum assistance     Functional Limitations Info  Sight, Hearing, Speech Sight Info: Adequate Hearing Info: Adequate Speech Info: Adequate    SPECIAL CARE FACTORS FREQUENCY  PT (By licensed PT)     PT Frequency: 5 times a week              Contractures Contractures Info: Not present    Additional Factors Info  Code Status, Allergies Code Status Info: FULL Allergies Info: NKDA           Current Medications (12/12/2021):  This is the current hospital active medication list Current Facility-Administered Medications  Medication Dose Route Frequency Provider Last Rate Last Admin   acetaminophen (TYLENOL) tablet 650 mg  650 mg Oral Q6H PRN Arrien, 12/14/2021, MD       Or   acetaminophen (TYLENOL) suppository 650 mg  650 mg Rectal Q6H PRN Arrien, York Ram, MD       amLODipine (NORVASC) tablet 10 mg  10 mg Oral Daily York Ram, MD   10 mg at 12/12/21 [phone]  cholecalciferol (VITAMIN D3) 25 MCG (1000 UNIT) tablet 1,000 Units  1,000 Units Oral Daily Arrien, York Ram, MD   1,000 Units at 12/12/21 0910   cyanocobalamin (VITAMIN B12) tablet 1,000 mcg  1,000 mcg Oral Daily Arrien, York Ram, MD   1,000 mcg at 12/12/21 0910   feeding supplement (BOOST / RESOURCE BREEZE) liquid 1 Container  1 Container Oral BID BM Arrien, York Ram, MD   1 Container at 12/12/21 0912   feeding supplement (ENSURE ENLIVE /  ENSURE PLUS) liquid 237 mL  237 mL Oral BID BM Arrien, York Ram, MD   237 mL at 12/11/21 2041   fluticasone (FLONASE) 50 MCG/ACT nasal spray 1 spray  1 spray Each Nare Daily Shah, Pratik D, DO       ipratropium-albuterol (DUONEB) 0.5-2.5 (3) MG/3ML nebulizer solution 3 mL  3 mL Nebulization TID Arrien, York Ram, MD   3 mL at 12/12/21 0748   LORazepam (ATIVAN) tablet 1-4 mg  1-4 mg Oral Q1H PRN Arrien, York Ram, MD       Or   LORazepam (ATIVAN) injection 1-4 mg  1-4 mg Intravenous Q1H PRN Arrien, York Ram, MD   2 mg at 12/11/21 2041   metoprolol tartrate (LOPRESSOR) tablet 25 mg  25 mg Oral BID Coralie Keens, MD   25 mg at 12/12/21 5945   morphine (PF) 2 MG/ML injection 2 mg  2 mg Intravenous Q2H PRN Arrien, York Ram, MD       multivitamins with iron tablet 1 tablet  1 tablet Oral Daily Arrien, York Ram, MD   1 tablet at 12/12/21 0910   ondansetron (ZOFRAN) tablet 4 mg  4 mg Oral Q6H PRN Arrien, York Ram, MD       Or   ondansetron [facility]) injection 4 mg  4 mg Intravenous Q6H PRN Arrien, York Ram, MD       oxyCODONE (Oxy IR/ROXICODONE) immediate release tablet 5 mg  5 mg Oral Q6H PRN Arrien, York Ram, MD   5 mg at 12/12/21 0128   pantoprazole (PROTONIX) EC tablet 40 mg  40 mg Oral Daily Arrien, York Ram, MD   40 mg at 12/12/21 0910   tamsulosin (FLOMAX) capsule 0.4 mg  0.4 mg Oral QPC supper Coralie Keens, MD   0.4 mg at 12/11/21 1816   thiamine (VITAMIN B1) tablet 250 mg  250 mg Oral BID Coralie Keens, MD   250 mg at 12/12/21 8592     Discharge Medications: Please see discharge summary for a list of discharge medications.  Relevant Imaging Results:  Relevant Lab Results:   Additional Information SS# [id number]  Leitha Bleak, RN

## 2021-12-12 NOTE — TOC Initial Note (Addendum)
Transition of Care [facility]) - Initial/Assessment Note    Patient Details  Name: Larry [name] MRN: [id number] Date of Birth: [birth year]  Transition of Care [facility]) CM/SW Contact:    Leitha Bleak, RN Phone Number: 12/12/2021, 11:04 AM  Clinical Narrative:      Patient admitted with with Alcohol withdrawal. Patient known to [facility], lives alone. PT is recommending SNF. Patient is agreeable. TOC left message with [facility] yesterday, Waiting to hear back to see if patient has SNF days left, he went there in last month, patient does not remember how many days he stayed. FL2 done today and sent over, will call again to see if they can take him. Patient is medically ready.             Addendum:  [facility] made a bed offer, Patient accepted, INS AUTH started. Team updated.   Expected Discharge Plan: Skilled Nursing Facility Barriers to Discharge: Continued Medical Work up   Patient Goals and CMS Choice Patient states their goals for this hospitalization and ongoing recovery are:: agreeable to SNF CMS Medicare.gov Compare Post Acute Care list provided to:: Patient Choice offered to / list presented to : Patient  Expected Discharge Plan and Services Expected Discharge Plan: Skilled Nursing Facility   Discharge Planning Services: CM Consult Post Acute Care Choice: Skilled Nursing Facility Living arrangements for the past 2 months: Mobile Home                   Prior Living Arrangements/Services Living arrangements for the past 2 months: Mobile Home Lives with:: Self Patient language and need for interpreter reviewed:: Yes Do you feel safe going back to the place where you live?: Yes      Need for Family Participation in Patient Care: Yes (Comment) Care giver support system in place?: No (comment)   Criminal Activity/Legal Involvement Pertinent to Current Situation/Hospitalization: No - Comment as needed  Activities of Daily Living Home Assistive Devices/Equipment: None ADL  Screening (condition at time of admission) Patient's cognitive ability adequate to safely complete daily activities?: Yes Is the patient deaf or have difficulty hearing?: No Does the patient have difficulty seeing, even when wearing glasses/contacts?: No Does the patient have difficulty concentrating, remembering, or making decisions?: No Patient able to express need for assistance with ADLs?: Yes Does the patient have difficulty dressing or bathing?: No Independently performs ADLs?: No Communication: Independent Dressing (OT): Independent Grooming: Needs assistance Is this a change from baseline?: Pre-admission baseline Feeding: Independent Bathing: Needs assistance Is this a change from baseline?: Change from baseline, expected to last >3 days Toileting: Dependent Is this a change from baseline?: Change from baseline, expected to last >3days In/Out Bed: Dependent Is this a change from baseline?: Change from baseline, expected to last >3 days Walks in Home: Needs assistance Is this a change from baseline?: Change from baseline, expected to last >3 days Does the patient have difficulty walking or climbing stairs?: Yes Weakness of Legs: Both Weakness of Arms/Hands: Both  Permission Sought/Granted     Emotional Assessment     Affect (typically observed): Accepting Orientation: : Oriented to Self, Oriented to Place, Oriented to  Time, Oriented to Situation Alcohol / Substance Use: Alcohol Use Psych Involvement: No (comment)  Admission diagnosis:  Chronic alcoholism (HCC) [F10.20] Thrombocytopenia (HCC) [D69.6] Physical deconditioning [R53.81] Generalized weakness [R53.1] Moderate protein-calorie malnutrition (HCC) [E44.0] Acute alcoholic intoxication without complication (HCC) [F10.920] Patient Active Problem List   Diagnosis Date Noted   COPD (chronic obstructive pulmonary  disease) (HCC)    Alcoholic ketoacidosis    Protein-calorie malnutrition, severe (HCC)    BPH  (benign prostatic hyperplasia) 11/01/2021   Dysphagia 11/01/2021   Acute respiratory failure with hypoxia and hypercapnia (HCC) 10/24/2021   Possible Aspiration into airway 10/18/2021   Somnolence 10/16/2021   Hypomagnesemia 10/14/2021   Uncontrolled pain 10/13/2021   Failure to thrive in adult 10/13/2021   Lumbar transverse process fracture 10/13/2021   Alcohol abuse 10/13/2021   Severe hepatic steatosis 10/13/2021   Bedbug infestation 10/13/2021   Multiple rib fractures 10/13/2021   Macrocytic anemia 10/13/2021   Hyponatremia 10/13/2021   Alcohol intoxication (HCC) 03/09/2021   Alcohol withdrawal (HCC) 03/09/2021   Elevated troponin 03/09/2021   Physical deconditioning 03/09/2021   Elevated AST (SGOT) 03/09/2021   Thrombocytopenia (HCC) 03/09/2021   Elevated MCV 03/09/2021   High anion gap metabolic acidosis 03/09/2021   Dehydration 03/09/2021   Pneumoconiosis, coal, 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   Eye discharge 03/09/2021   Falls frequently 02/06/2021   Hypertension 02/06/2021   Proximal humerus fracture 09/24/2014   PCP:  Benita Stabile, MD Pharmacy:   [facility] [place] [place] - [address] [place] [address] Phone: [phone] Fax: [phone]   Readmission Risk Interventions    12/12/2021   11:03 AM 03/10/2021    2:14 PM 02/11/2021    1:11 PM  Readmission Risk Prevention Plan  Medication Screening  Complete   Transportation Screening Complete Complete Complete  PCP or Specialist Appt within 3-5 Days Not Complete    Home Care Screening   Complete  Medication Review (RN CM)   Complete  HRI or Home Care Consult Complete    Social Work Consult for Recovery Care Planning/Counseling Complete    Palliative Care Screening Not Applicable    Medication Review Ocean[employer]) Complete

## 2021-12-13 DIAGNOSIS — K219 Gastro-esophageal reflux disease without esophagitis: Secondary | ICD-10-CM | POA: Diagnosis not present

## 2021-12-13 DIAGNOSIS — R1312 Dysphagia, oropharyngeal phase: Secondary | ICD-10-CM | POA: Diagnosis not present

## 2021-12-13 DIAGNOSIS — M6281 Muscle weakness (generalized): Secondary | ICD-10-CM | POA: Diagnosis not present

## 2021-12-13 DIAGNOSIS — E43 Unspecified severe protein-calorie malnutrition: Secondary | ICD-10-CM | POA: Diagnosis not present

## 2021-12-13 DIAGNOSIS — D649 Anemia, unspecified: Secondary | ICD-10-CM | POA: Diagnosis not present

## 2021-12-13 DIAGNOSIS — G8929 Other chronic pain: Secondary | ICD-10-CM | POA: Diagnosis not present

## 2021-12-13 DIAGNOSIS — N400 Enlarged prostate without lower urinary tract symptoms: Secondary | ICD-10-CM | POA: Diagnosis not present

## 2021-12-13 DIAGNOSIS — E038 Other specified hypothyroidism: Secondary | ICD-10-CM | POA: Diagnosis not present

## 2021-12-13 DIAGNOSIS — F1010 Alcohol abuse, uncomplicated: Secondary | ICD-10-CM | POA: Diagnosis not present

## 2021-12-13 DIAGNOSIS — F419 Anxiety disorder, unspecified: Secondary | ICD-10-CM | POA: Diagnosis not present

## 2021-12-13 DIAGNOSIS — G894 Chronic pain syndrome: Secondary | ICD-10-CM | POA: Diagnosis not present

## 2021-12-13 DIAGNOSIS — F10188 Alcohol abuse with other alcohol-induced disorder: Secondary | ICD-10-CM | POA: Diagnosis not present

## 2021-12-13 DIAGNOSIS — R262 Difficulty in walking, not elsewhere classified: Secondary | ICD-10-CM | POA: Diagnosis not present

## 2021-12-13 DIAGNOSIS — J449 Chronic obstructive pulmonary disease, unspecified: Secondary | ICD-10-CM | POA: Diagnosis not present

## 2021-12-13 DIAGNOSIS — R2689 Other abnormalities of gait and mobility: Secondary | ICD-10-CM | POA: Diagnosis not present

## 2021-12-13 DIAGNOSIS — I10 Essential (primary) hypertension: Secondary | ICD-10-CM | POA: Diagnosis not present

## 2021-12-13 DIAGNOSIS — F10939 Alcohol use, unspecified with withdrawal, unspecified: Secondary | ICD-10-CM | POA: Diagnosis not present

## 2021-12-13 DIAGNOSIS — R627 Adult failure to thrive: Secondary | ICD-10-CM | POA: Diagnosis not present

## 2021-12-13 MED ORDER — IPRATROPIUM-ALBUTEROL 0.5-2.5 (3) MG/3ML IN SOLN: 3.0000 mL | Freq: Two times a day (BID) | RESPIRATORY_TRACT | Status: DC

## 2021-12-13 NOTE — TOC Transition Note (Signed)
Transition of Care [facility]) - CM/SW Discharge Note   Patient Details  Name: Larry [name] MRN: [id number] Date of Birth: 12/13/1950  Transition of Care [facility]) CM/SW Contact:  Iona Beard, [place] Phone Number: 12/13/2021, 11:10 AM   Clinical Narrative:    CSW confirmed insurance Josem Kaufmann is approved for SNF. CSW spoke to [place] with [facility] who states that pt will go to room C26 bed 2. CSW updated RN with numbers for room and report.   CSW met with pt in room to speak about transfer to facility. CSW explained that pt is in copay days. Pt will not start paying for stay until he has completed SNF stay. CSW explained that facility will work with pt on a payment plan. Pt states he is agreeable to this and will be able to work on a payment plan with the facility.   CSW to complete med necessity and call for EMS. TOC signing off.   Final next level of care: Skilled Nursing Facility Barriers to Discharge: Barriers Resolved   Patient Goals and CMS Choice Patient states their goals for this hospitalization and ongoing recovery are:: agreeable to SNF CMS Medicare.gov Compare Post Acute Care list provided to:: Patient Choice offered to / list presented to : Patient  Discharge Placement              Patient chooses bed at: [facility] Patient to be transferred to facility by: EMS   Patient and family notified of of transfer: 12/13/21  Discharge Plan and Services   Discharge Planning Services: CM Consult Post Acute Care Choice: [place]                               Social Determinants of Health (SDOH) Interventions     Readmission Risk Interventions    12/12/2021   11:03 AM 03/10/2021    2:14 PM 02/11/2021    1:11 PM  Readmission Risk Prevention Plan  Medication Screening  Complete   Transportation Screening Complete Complete Complete  PCP or Specialist Appt within 3-5 Days Not Complete    Home Care Screening   Complete  Medication  Review (RN CM)   Complete  HRI or Home Care Consult Complete    Social Work Consult for [place] Planning/Counseling Complete    Palliative Care Screening Not Applicable    Medication Review Press photographer) Complete

## 2021-12-13 NOTE — Care Management Important Message (Signed)
Important Message  Patient Details  Name: ALEKZANDER CARDELL MRN: [id number] Date of Birth: 09-30-1950   Medicare Important Message Given:  N/A - LOS <3 / Initial given by admissions     Corey Harold 12/13/2021, 10:08 AM

## 2021-12-13 NOTE — Progress Notes (Signed)
Patient seen and evaluated this a.m. with no new complaints or concerns noted.  He is stable for discharge to SNF with discharge summary dictated 8/21.  Please refer to summary for full details regarding hospitalization.  Total care time: 15 minutes.

## 2021-12-15 DIAGNOSIS — G894 Chronic pain syndrome: Secondary | ICD-10-CM | POA: Diagnosis not present

## 2021-12-15 DIAGNOSIS — J449 Chronic obstructive pulmonary disease, unspecified: Secondary | ICD-10-CM | POA: Diagnosis not present

## 2021-12-15 DIAGNOSIS — F10939 Alcohol use, unspecified with withdrawal, unspecified: Secondary | ICD-10-CM | POA: Diagnosis not present

## 2021-12-15 DIAGNOSIS — I10 Essential (primary) hypertension: Secondary | ICD-10-CM | POA: Diagnosis not present

## 2021-12-15 DIAGNOSIS — M6281 Muscle weakness (generalized): Secondary | ICD-10-CM | POA: Diagnosis not present

## 2021-12-15 DIAGNOSIS — E43 Unspecified severe protein-calorie malnutrition: Secondary | ICD-10-CM | POA: Diagnosis not present

## 2021-12-20 ENCOUNTER — Ambulatory Visit: Payer: Self-pay

## 2021-12-20 DIAGNOSIS — E038 Other specified hypothyroidism: Secondary | ICD-10-CM | POA: Diagnosis not present

## 2021-12-20 DIAGNOSIS — F10939 Alcohol use, unspecified with withdrawal, unspecified: Secondary | ICD-10-CM | POA: Diagnosis not present

## 2021-12-20 DIAGNOSIS — N400 Enlarged prostate without lower urinary tract symptoms: Secondary | ICD-10-CM | POA: Diagnosis not present

## 2021-12-20 DIAGNOSIS — J449 Chronic obstructive pulmonary disease, unspecified: Secondary | ICD-10-CM | POA: Diagnosis not present

## 2021-12-20 DIAGNOSIS — I10 Essential (primary) hypertension: Secondary | ICD-10-CM | POA: Diagnosis not present

## 2021-12-20 DIAGNOSIS — G8929 Other chronic pain: Secondary | ICD-10-CM | POA: Diagnosis not present

## 2021-12-20 DIAGNOSIS — F1010 Alcohol abuse, uncomplicated: Secondary | ICD-10-CM | POA: Diagnosis not present

## 2021-12-20 DIAGNOSIS — F10188 Alcohol abuse with other alcohol-induced disorder: Secondary | ICD-10-CM | POA: Diagnosis not present

## 2021-12-20 DIAGNOSIS — R627 Adult failure to thrive: Secondary | ICD-10-CM | POA: Diagnosis not present

## 2021-12-20 DIAGNOSIS — K219 Gastro-esophageal reflux disease without esophagitis: Secondary | ICD-10-CM | POA: Diagnosis not present

## 2021-12-20 DIAGNOSIS — M6281 Muscle weakness (generalized): Secondary | ICD-10-CM | POA: Diagnosis not present

## 2021-12-20 DIAGNOSIS — F419 Anxiety disorder, unspecified: Secondary | ICD-10-CM | POA: Diagnosis not present

## 2021-12-21 ENCOUNTER — Other Ambulatory Visit: Payer: Self-pay

## 2021-12-21 DIAGNOSIS — E43 Unspecified severe protein-calorie malnutrition: Secondary | ICD-10-CM | POA: Diagnosis not present

## 2021-12-21 DIAGNOSIS — J449 Chronic obstructive pulmonary disease, unspecified: Secondary | ICD-10-CM | POA: Diagnosis not present

## 2021-12-21 DIAGNOSIS — I10 Essential (primary) hypertension: Secondary | ICD-10-CM | POA: Diagnosis not present

## 2021-12-21 DIAGNOSIS — M6281 Muscle weakness (generalized): Secondary | ICD-10-CM | POA: Diagnosis not present

## 2021-12-21 DIAGNOSIS — G894 Chronic pain syndrome: Secondary | ICD-10-CM | POA: Diagnosis not present

## 2021-12-21 DIAGNOSIS — F10939 Alcohol use, unspecified with withdrawal, unspecified: Secondary | ICD-10-CM | POA: Diagnosis not present

## 2021-12-21 NOTE — Patient Outreach (Signed)
[facility]) Care Management  12/21/2021  Larry [name] 1950/11/19 [id number]   Upon chart review patient was admitted and discharged again from the hospital. Looks like patient discharged to [facility].    Telephone call to [facility]. Patient remains inpatient there.  Plan: RN CM will close case at this time.  [name], RN, MSN [facility] Care Management Care Management Coordinator Direct Line [phone] Toll Free: [phone]  Fax: [phone]

## 2022-03-20 ENCOUNTER — Emergency Department (HOSPITAL_COMMUNITY)
Admission: EM | Admit: 2022-03-20 | Discharge: 2022-03-20 | Disposition: A | Discharge status: Home / Self Care | Payer: Medicare HMO | Attending: Emergency Medicine | Admitting: Emergency Medicine

## 2022-03-20 ENCOUNTER — Other Ambulatory Visit: Payer: Self-pay

## 2022-03-20 ENCOUNTER — Emergency Department (HOSPITAL_COMMUNITY): Payer: Medicare HMO

## 2022-03-20 DIAGNOSIS — R000 Tachycardia, unspecified: Secondary | ICD-10-CM | POA: Diagnosis not present

## 2022-03-20 DIAGNOSIS — I4510 Unspecified right bundle-branch block: Secondary | ICD-10-CM | POA: Diagnosis not present

## 2022-03-20 DIAGNOSIS — R0789 Other chest pain: Secondary | ICD-10-CM | POA: Diagnosis not present

## 2022-03-20 DIAGNOSIS — R197 Diarrhea, unspecified: Secondary | ICD-10-CM | POA: Insufficient documentation

## 2022-03-20 DIAGNOSIS — Z79899 Other long term (current) drug therapy: Secondary | ICD-10-CM | POA: Diagnosis not present

## 2022-03-20 DIAGNOSIS — J449 Chronic obstructive pulmonary disease, unspecified: Secondary | ICD-10-CM | POA: Insufficient documentation

## 2022-03-20 DIAGNOSIS — I10 Essential (primary) hypertension: Secondary | ICD-10-CM | POA: Diagnosis not present

## 2022-03-20 DIAGNOSIS — R531 Weakness: Secondary | ICD-10-CM | POA: Diagnosis not present

## 2022-03-20 DIAGNOSIS — J90 Pleural effusion, not elsewhere classified: Secondary | ICD-10-CM | POA: Diagnosis not present

## 2022-03-20 DIAGNOSIS — R079 Chest pain, unspecified: Secondary | ICD-10-CM | POA: Diagnosis not present

## 2022-03-20 DIAGNOSIS — M791 Myalgia, unspecified site: Secondary | ICD-10-CM | POA: Insufficient documentation

## 2022-03-20 DIAGNOSIS — M546 Pain in thoracic spine: Secondary | ICD-10-CM | POA: Insufficient documentation

## 2022-03-20 LAB — TROPONIN I (HIGH SENSITIVITY)
Troponin I (High Sensitivity): 7 ng/L (ref <18)
Troponin I (High Sensitivity): 8 ng/L (ref <18)

## 2022-03-20 LAB — BASIC METABOLIC PANEL
Anion gap: 11 (ref 5–15)
BUN: 12 mg/dL (ref 8–23)
CO2: 24 mmol/L (ref 22–32)
Calcium: 9.9 mg/dL (ref 8.9–10.3)
Chloride: 102 mmol/L (ref 98–111)
Creatinine, Ser: 0.79 mg/dL (ref 0.61–1.24)
GFR, Estimated: >60 mL/min (ref >60)
Glucose, Bld: 98 mg/dL (ref 70–99)
Potassium: 4.8 mmol/L (ref 3.5–5.1)
Sodium: 137 mmol/L (ref 135–145)

## 2022-03-20 LAB — CBC
HCT: 40.6 % (ref 39.0–52.0)
Hemoglobin: 13.9 g/dL (ref 13.0–17.0)
MCH: 32.1 pg (ref 26.0–34.0)
MCHC: 34.2 g/dL (ref 30.0–36.0)
MCV: 93.8 fL (ref 80.0–100.0)
Platelets: 181 10*3/uL (ref 150–400)
RBC: 4.33 MIL/uL (ref 4.22–5.81)
RDW: 14.6 % (ref 11.5–15.5)
WBC: 8.8 10*3/uL (ref 4.0–10.5)
nRBC: 0 % (ref 0.0–0.2)

## 2022-03-20 LAB — HEPATIC FUNCTION PANEL
ALT: 11 U/L (ref 0–44)
AST: 18 U/L (ref 15–41)
Albumin: 4.1 g/dL (ref 3.5–5.0)
Alkaline Phosphatase: 86 U/L (ref 38–126)
Bilirubin, Direct: 0.2 mg/dL (ref 0.0–0.2)
Indirect Bilirubin: 0.6 mg/dL (ref 0.3–0.9)
Total Bilirubin: 0.8 mg/dL (ref 0.3–1.2)
Total Protein: 7 g/dL (ref 6.5–8.1)

## 2022-03-20 MED ORDER — IBUPROFEN 800 MG PO TABS
800.0000 mg | ORAL_TABLET | Freq: Once | ORAL | Status: AC
  Administered 2022-03-20: 800 mg via ORAL
  Filled 2022-03-20: qty 1

## 2022-03-20 MED ORDER — NAPROXEN 500 MG PO TABS: 500.0000 mg | ORAL_TABLET | Freq: Two times a day (BID) | ORAL | 0 refills | Status: DC

## 2022-03-20 NOTE — Discharge Instructions (Signed)
Take the medication as directed.  Please follow up with your primary care provider.  Return to the ER for any new or worsening symptoms

## 2022-03-20 NOTE — ED Provider Notes (Signed)
[facility] EMERGENCY DEPARTMENT Provider Note   CSN: [id number] Arrival date & time: 03/20/22  1848     History  Chief Complaint  Patient presents with   Chest Pain    Larry [name] is a 71 y.o. male.   Chest Pain Associated symptoms: back pain   Associated symptoms: no abdominal pain, no dizziness, no fever, no headache, no nausea, no numbness, no shortness of breath, no vomiting and no weakness        Larry [name] is a 71 y.o. male with past medical history of COPD, hypertension, and chronic alcohol abuse.  he presents to the Emergency Department complaining of chest pain, generalized body aches, and right chest and upper back pain.  Chest pain and upper back pain is worse with movement and palpation.  Symptoms have been present for 3 days.  He notes episode of loose watery stools earlier today.  States he drinks alcohol daily, typically drinks wine.  Drink half a bottle of wine today.  He denies any nausea, vomiting, or abdominal pain.  No fever or chills.   Home Medications Prior to Admission medications   Medication Sig Start Date End Date Taking? Authorizing Provider  acetaminophen (TYLENOL) 325 MG tablet Take 2 tablets (650 mg total) by mouth every 6 (six) hours as needed for headache, mild pain or fever. 11/01/21 11/01/22 Yes Vassie Loll, MD  cholecalciferol (VITAMIN D3) 25 MCG (1000 UNIT) tablet Take 1,000 Units by mouth daily.   Yes [provider]  feeding supplement (ENSURE ENLIVE / ENSURE PLUS) LIQD Take 237 mLs by mouth 2 (two) times daily between meals. 12/12/21  Yes Shah, Pratik D, DO  lisinopril (ZESTRIL) 10 MG tablet Take 10 mg by mouth 2 (two) times daily. 01/18/22  Yes [provider]  Multiple Vitamin (MULTIVITAMIN WITH MINERALS) TABS tablet Take 1 tablet by mouth daily. 03/16/21  Yes Emokpae, Courage, MD  amLODipine (NORVASC) 10 MG tablet Take 1 tablet (10 mg total) by mouth daily. Patient not taking: Reported on 03/20/2022 12/13/21  01/12/22  Maurilio Lovely D, DO  chlordiazePOXIDE (LIBRIUM) 5 MG capsule Take 1 capsule (5 mg total) by mouth 3 (three) times daily as needed for anxiety or withdrawal. Patient not taking: Reported on 03/20/2022 12/12/21   Maurilio Lovely D, DO  metoprolol tartrate (LOPRESSOR) 25 MG tablet Take 1 tablet (25 mg total) by mouth 2 (two) times daily. Patient not taking: Reported on 03/20/2022 12/12/21 01/11/22  Maurilio Lovely D, DO  oxyCODONE (OXY IR/ROXICODONE) 5 MG immediate release tablet Take 1 tablet (5 mg total) by mouth every 6 (six) hours as needed for severe pain. Patient not taking: Reported on 03/20/2022 12/12/21   Maurilio Lovely D, DO  pantoprazole (PROTONIX) 40 MG tablet Take 1 tablet (40 mg total) by mouth daily. Patient not taking: Reported on 12/11/2021 11/01/21 11/01/22  Vassie Loll, MD  tamsulosin (FLOMAX) 0.4 MG CAPS capsule Take 1 capsule (0.4 mg total) by mouth daily after supper. Patient not taking: Reported on 12/11/2021 11/02/21   Vassie Loll, MD      Allergies    Patient has no known allergies.    Review of Systems   Review of Systems  Constitutional:  Negative for chills and fever.  Respiratory:  Negative for shortness of breath.   Cardiovascular:  Positive for chest pain.  Gastrointestinal:  Positive for diarrhea. Negative for abdominal pain, nausea and vomiting.  Genitourinary:  Negative for difficulty urinating, dysuria and flank pain.  Musculoskeletal:  Positive for back  pain.  Skin:  Negative for rash.  Neurological:  Negative for dizziness, weakness, numbness and headaches.    Physical Exam Updated Vital Signs BP (!) 142/68   Pulse 91   Temp 98 F (36.7 C) (Oral)   Resp 17   Ht 6\' 2"  (1.88 m)   Wt 61.2 kg   SpO2 94%   BMI 17.33 kg/m  Physical Exam Vitals and nursing note reviewed.  Constitutional:      General: He is not in acute distress.    Appearance: Normal appearance. He is well-developed. He is not ill-appearing or toxic-appearing.  HENT:     Head:  Atraumatic.     Mouth/Throat:     Mouth: Mucous membranes are moist.  Eyes:     Conjunctiva/sclera: Conjunctivae normal.  Cardiovascular:     Rate and Rhythm: Normal rate and regular rhythm.     Pulses: Normal pulses.  Pulmonary:     Effort: Pulmonary effort is normal. No respiratory distress.  Chest:     Chest wall: Tenderness (Tenderness palpation lateral right chest wall.  No soft tissue crepitus, ecchymosis or obvious bony deformity.) present.  Abdominal:     General: There is no distension.     Palpations: Abdomen is soft.     Tenderness: There is no abdominal tenderness. There is no right CVA tenderness, left CVA tenderness or guarding.  Musculoskeletal:        General: Normal range of motion.     Thoracic back: Tenderness present. No swelling, signs of trauma or bony tenderness.     Right lower leg: No edema.     Left lower leg: No edema.     Comments: Tenderness palpation of the mid to lower right thoracic paraspinal muscles.  No midline tenderness.  No tenderness of the lumbar spine.  Skin:    General: Skin is warm.     Capillary Refill: Capillary refill takes less than 2 seconds.     Findings: No rash.  Neurological:     General: No focal deficit present.     Mental Status: He is alert.     Sensory: No sensory deficit.     Motor: No weakness.     ED Results / Procedures / Treatments   Labs (all labs ordered are listed, but only abnormal results are displayed) Labs Reviewed  BASIC METABOLIC PANEL  CBC  HEPATIC FUNCTION PANEL  TROPONIN I (HIGH SENSITIVITY)  TROPONIN I (HIGH SENSITIVITY)    EKG EKG Interpretation  Date/Time:  Monday March 20 2022 19:10:29 EST Ventricular Rate:  96 PR Interval:  160 QRS Duration: 132 QT Interval:  392 QTC Calculation: 495 R Axis:   -73 Text Interpretation: Normal sinus rhythm Right atrial enlargement Right bundle branch block Left anterior fascicular block Left ventricular hypertrophy with repolarization abnormality ( R  in aVL , Romhilt-Estes ) Anteroseptal infarct , age undetermined Abnormal ECG When compared with ECG of 09-Dec-2021 23:45, PREVIOUS ECG IS PRESENT Confirmed by 11-Dec-2021 [phone]) on 03/20/2022 8:44:42 PM  Radiology DG Chest 2 View  Result Date: 03/20/2022 CLINICAL DATA:  Chest pain.  Body aches.  Weakness.  Chest pain EXAM: CHEST - 2 VIEW COMPARISON:  Chest radiograph 10/26/2021 FINDINGS: Pleural effusion. No pneumothorax. Unchanged cardiac and mediastinal contours when accounting for differences positioning. Redemonstrated multiple bilateral calcific densities, compatible with a granuloma seen on prior CT chest dated 08/30/2020. No new airspace opacity. Pulmonary hyperinflation. Redemonstrated multiple thoracic spine compression deformities, unchanged compared to 08/30/20. Right humeral head screw/plate fixation  hardware. No acute displaced rib fractures. IMPRESSION: No new focal airspace opacities. Electronically Signed   By: Lorenza Cambridge M.D.   On: 03/20/2022 19:59    Procedures Procedures    Medications Ordered in ED Medications  ibuprofen (ADVIL) tablet 800 mg (has no administration in time range)    ED Course/ Medical Decision Making/ A&P                           Medical Decision Making Patient here with complaint of right-sided chest pain, generalized body aches, and episode of diarrhea earlier today.  Chest pain primarily lateral right chest wall and right upper back.  He denies known injury, states pain is worse with movement and with palpation.  He denies any shortness of breath, nausea, vomiting or or fever no reported cough.  On exam, patient well-appearing nontoxic.  Vital signs reviewed.  He does have reproducible tenderness to palpation of the lateral right chest wall and right thoracic paraspinal area.  There is no guarding.  His abdominal exam is reassuring.  Patient's differential would include pneumonia, PE, ACS, rib fracture, MSK.  I suspect pain is related to  musculoskeletal injury, he does have a history of alcohol abuse.  He does not appear acutely intoxicated at this time.  There is no clinical evidence of withdrawal we will check labs, EKG and chest x-ray  Amount and/or Complexity of Data Reviewed Labs: ordered.    Details: Labs interpreted by me, all labs unremarkable.  No evidence of leukocytosis, chemistries without evidence of AKA or transaminitis.  Delta troponin flat Radiology: ordered.    Details: Chest x-ray shows no evidence for pneumonia, there is report demonstrated multiple thoracic spine compression deformities unchanged since May 2022  ECG/medicine tests: ordered.    Details: Normal sinus rhythm, right atrial enlargement right bundle branch block Discussion of management or test interpretation with external provider(s): Patient here for evaluation of right-sided chest pain.  Likely MSK.  His x-ray does not show evidence of a pneumonia or new rib fracture.  He does have some thoracic compression deformities which may also be contributing to his level of pain.  The pain is reproducible.  Low clinical suspicion for PE as patient does not have any tachycardia tachypnea or hypoxia.  There is no complaint of shortness of breath.  Feel he is appropriate for discharge home, will address his pain here.  No concerning symptoms for infectious process.  Risk Prescription drug management.           Final Clinical Impression(s) / ED Diagnoses Final diagnoses:  Chest wall pain    Rx / DC Orders ED Discharge Orders     None         Pauline Aus, PA-C 03/20/22 2313    Terald Sleeper, MD 03/21/22 [phone]

## 2022-03-20 NOTE — ED Triage Notes (Signed)
Pt c/o chest pain and upper back pain x 3 days. Pt also states he hurts all over.

## 2022-03-20 NOTE — ED Notes (Signed)
ED Provider at bedside. 

## 2022-06-07 DIAGNOSIS — I10 Essential (primary) hypertension: Secondary | ICD-10-CM | POA: Diagnosis not present

## 2022-06-07 DIAGNOSIS — Z139 Encounter for screening, unspecified: Secondary | ICD-10-CM | POA: Diagnosis not present

## 2022-06-09 ENCOUNTER — Other Ambulatory Visit: Payer: Self-pay | Admitting: Family Medicine

## 2022-06-09 DIAGNOSIS — E871 Hypo-osmolality and hyponatremia: Secondary | ICD-10-CM | POA: Diagnosis not present

## 2022-06-09 DIAGNOSIS — J64 Unspecified pneumoconiosis: Secondary | ICD-10-CM | POA: Diagnosis not present

## 2022-06-09 DIAGNOSIS — R945 Abnormal results of liver function studies: Secondary | ICD-10-CM

## 2022-06-09 DIAGNOSIS — E162 Hypoglycemia, unspecified: Secondary | ICD-10-CM | POA: Diagnosis not present

## 2022-06-09 DIAGNOSIS — R109 Unspecified abdominal pain: Secondary | ICD-10-CM | POA: Diagnosis not present

## 2022-06-09 DIAGNOSIS — F1010 Alcohol abuse, uncomplicated: Secondary | ICD-10-CM | POA: Diagnosis not present

## 2022-06-09 DIAGNOSIS — I10 Essential (primary) hypertension: Secondary | ICD-10-CM | POA: Diagnosis not present

## 2022-06-09 DIAGNOSIS — E875 Hyperkalemia: Secondary | ICD-10-CM | POA: Diagnosis not present

## 2022-06-22 ENCOUNTER — Ambulatory Visit (HOSPITAL_COMMUNITY): Payer: Medicare HMO

## 2022-07-05 ENCOUNTER — Ambulatory Visit (HOSPITAL_COMMUNITY): Payer: Medicare HMO

## 2022-07-13 ENCOUNTER — Ambulatory Visit (HOSPITAL_COMMUNITY): Payer: Medicare HMO

## 2022-07-28 ENCOUNTER — Encounter (HOSPITAL_COMMUNITY): Payer: Self-pay

## 2022-07-28 ENCOUNTER — Ambulatory Visit (HOSPITAL_COMMUNITY): Payer: Medicare HMO

## 2022-10-24 IMAGING — DX DG CHEST 1V PORT
1 series · 1 of 1 positions shown · non-contrast
Comparison: Two-view chest x-ray 08/29/2020

CLINICAL DATA: Fall.  Cough.  Back pain.

EXAM:
PORTABLE CHEST 1 VIEW

[chest ap]
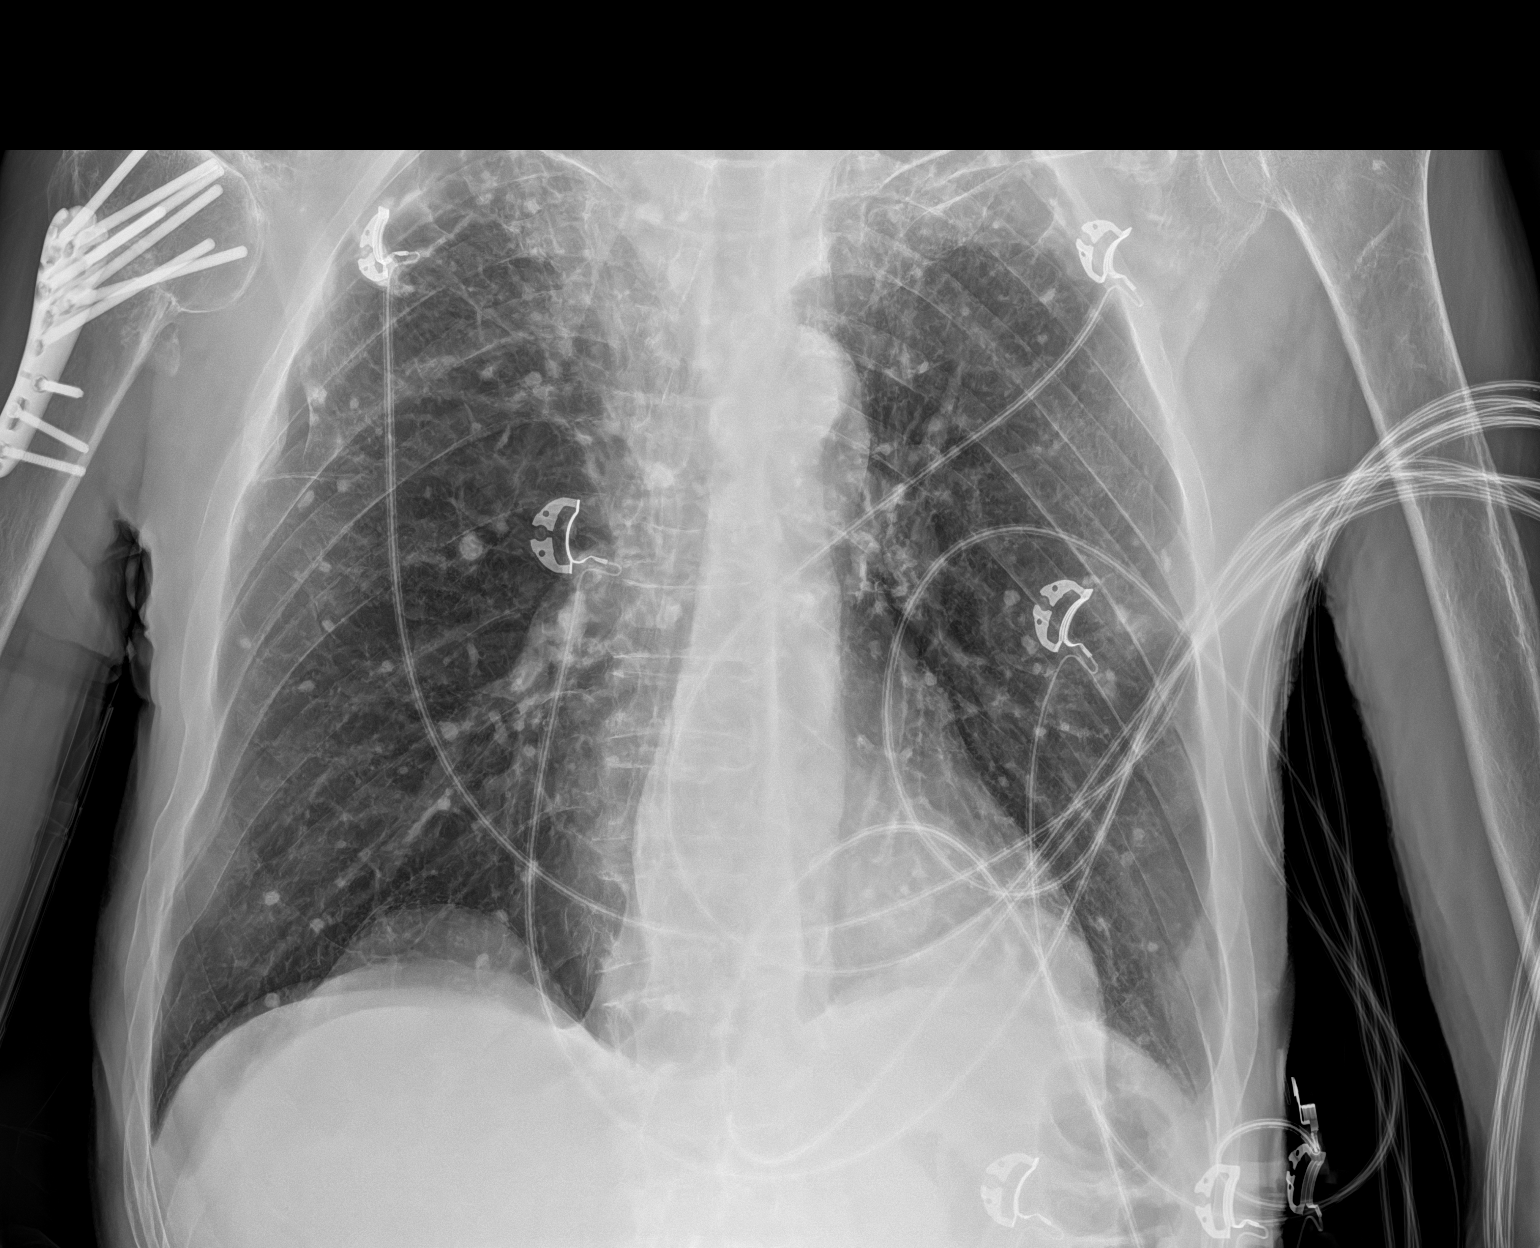

[1 of 1 positions shown; findings below may reference images not displayed]

FINDINGS: Heart size normal. Atherosclerotic calcifications are present at the
aortic arch. Extensive granulomatous disease is again noted. No
superimposed airspace disease is present. No edema or effusion is
present. Remote right-sided rib fractures are noted. Postoperative
changes again noted in the right humerus. Chronic inferior
dislocation of the right shoulder noted.
IMPRESSION: 1. No acute cardiopulmonary disease or significant interval change.
2. Extensive granulomatous disease.
3. Remote right-sided rib fractures.
4. Chronic right shoulder dislocation

## 2022-10-24 IMAGING — CT CT RENAL STONE PROTOCOL
2 of 4 series · 15 of 46 positions shown, 17 images · non-contrast
Comparison: None available

CLINICAL DATA: Flank pain, suspected kidney stone in a 70-year-old
male.



[Series 3: axial st · axial · 0.75mm/px · z∈[+607,+997]mm · 12 of 90 slices shown, 14 images]
[im 6/90  soft-tissue]
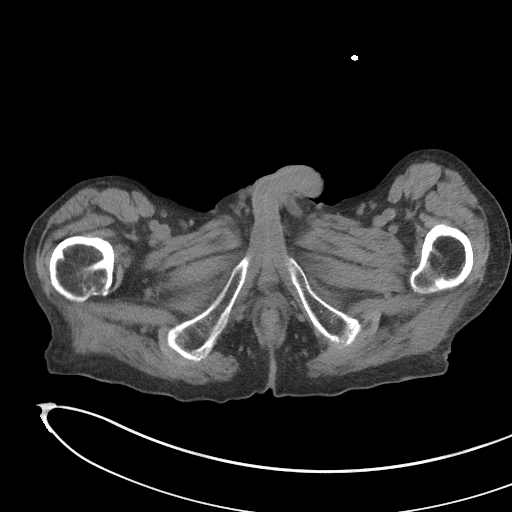
[im 6/90  bone]
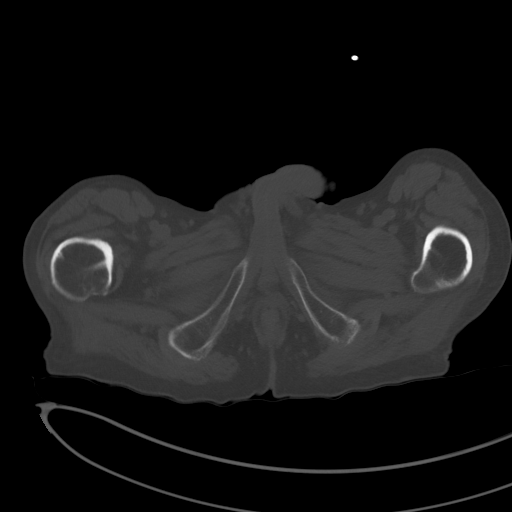
[im 16/90  soft-tissue]
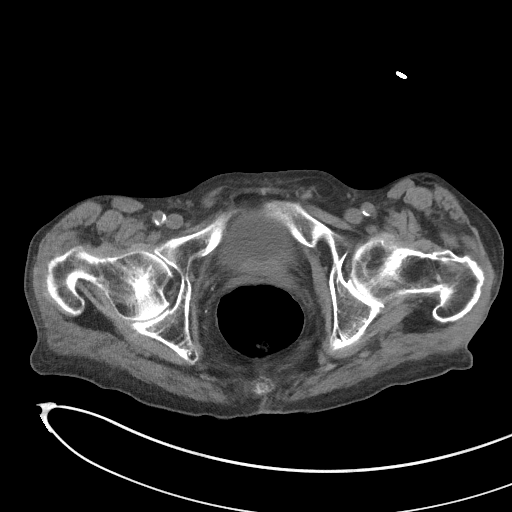
[im 21/90  soft-tissue]
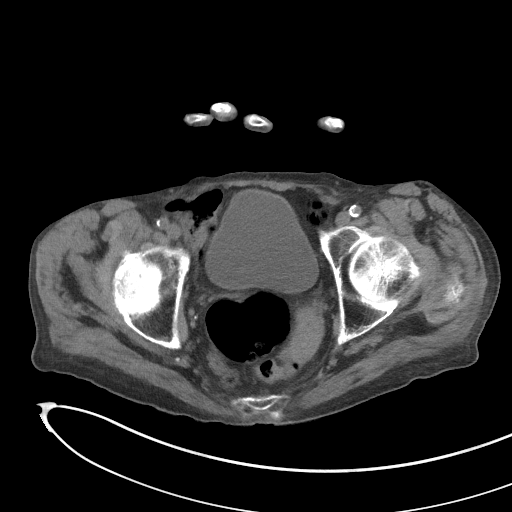
[im 27/90  soft-tissue]
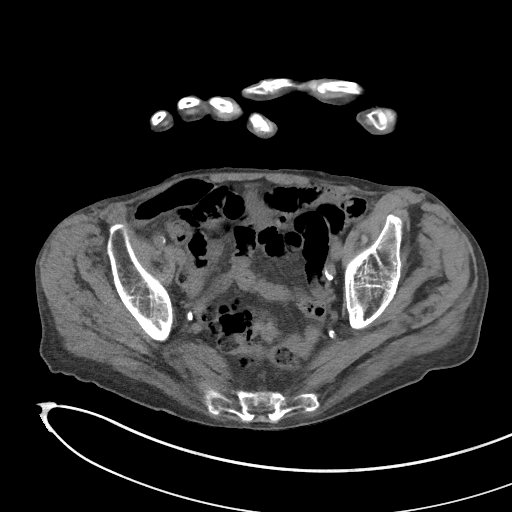
[im 37/90  soft-tissue]
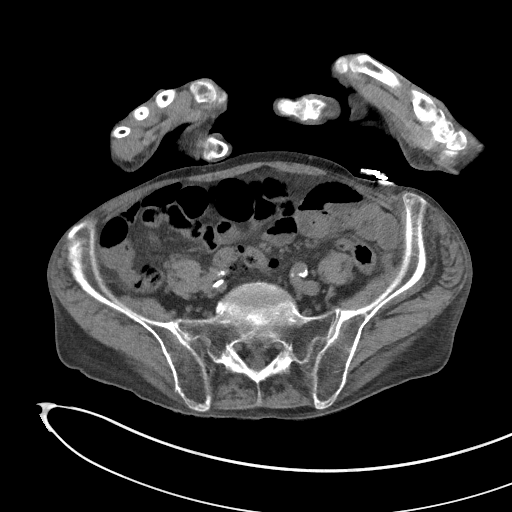
[im 42/90  soft-tissue]
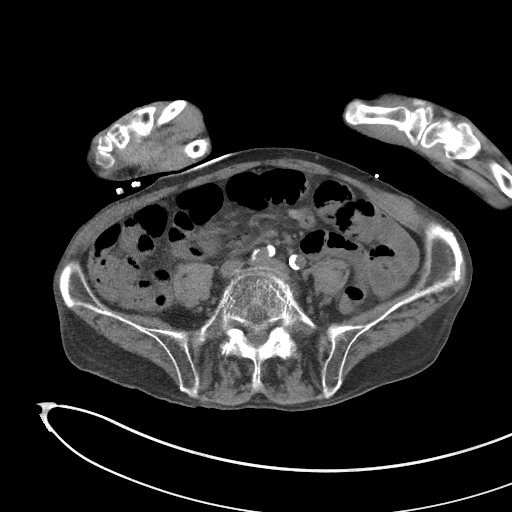
[im 48/90  soft-tissue]
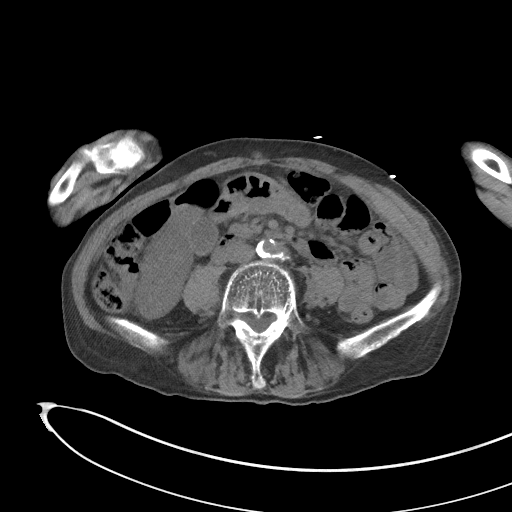
[im 58/90  soft-tissue]
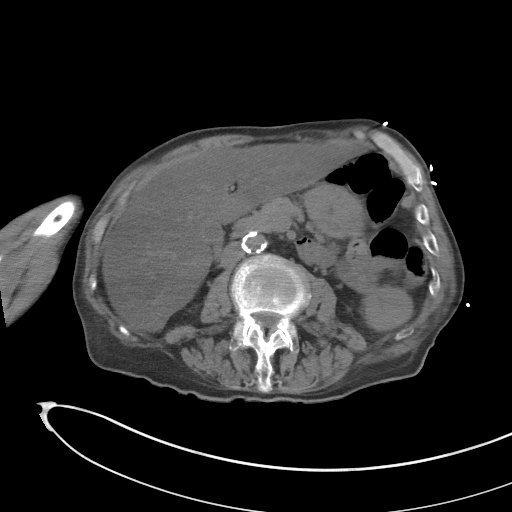
[im 63/90  soft-tissue]
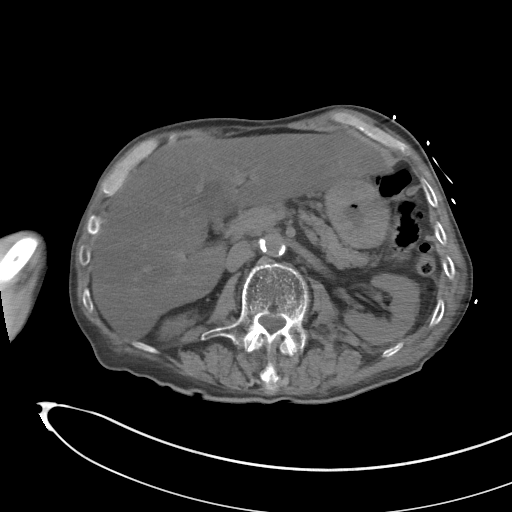
[im 63/90  bone]
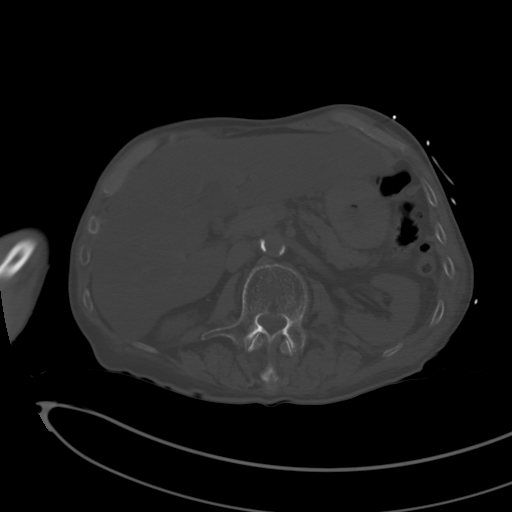
[im 69/90  soft-tissue]
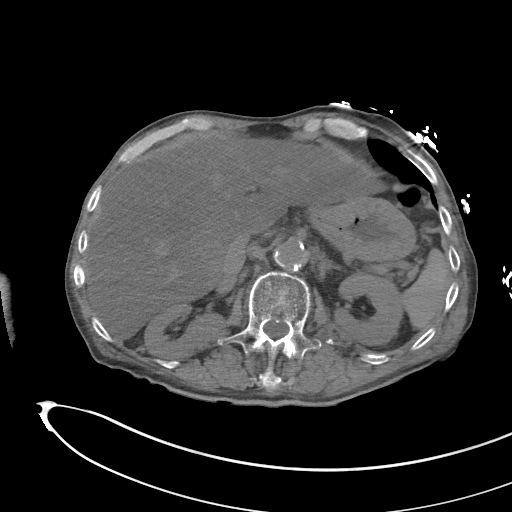
[im 79/90  soft-tissue]
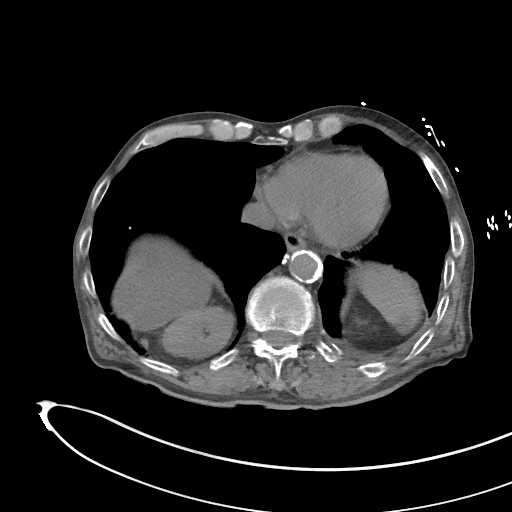
[im 84/90  soft-tissue]
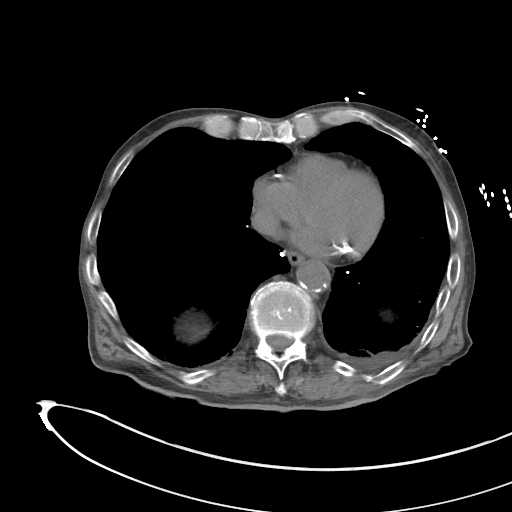

[Series 6: coronal st · coronal · 0.78mm/px · 3 of 97 slices shown]
[im 33/97  soft-tissue]
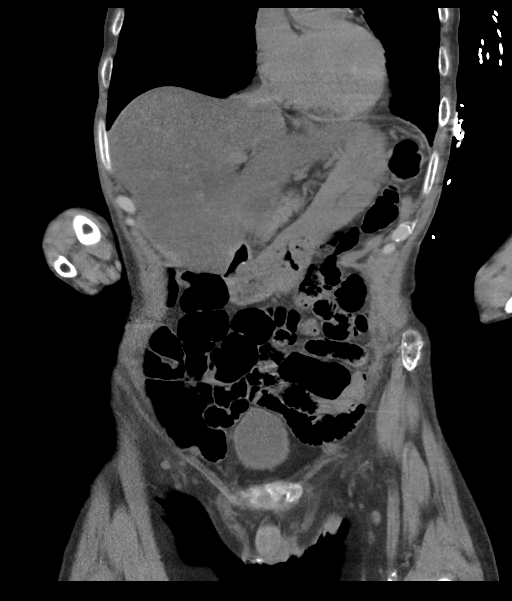
[im 43/97  soft-tissue]
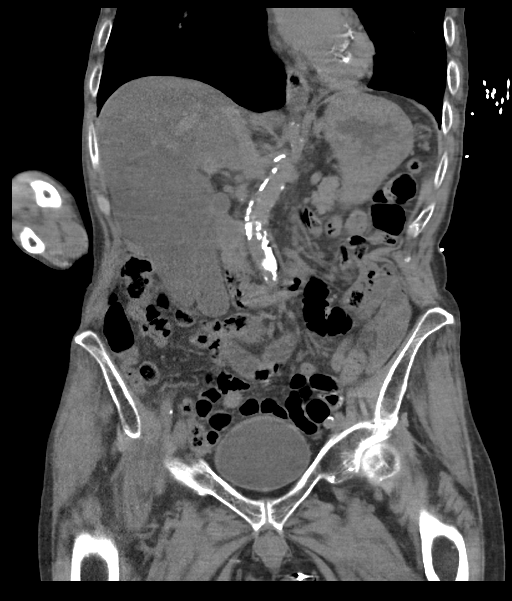
[im 54/97  soft-tissue]
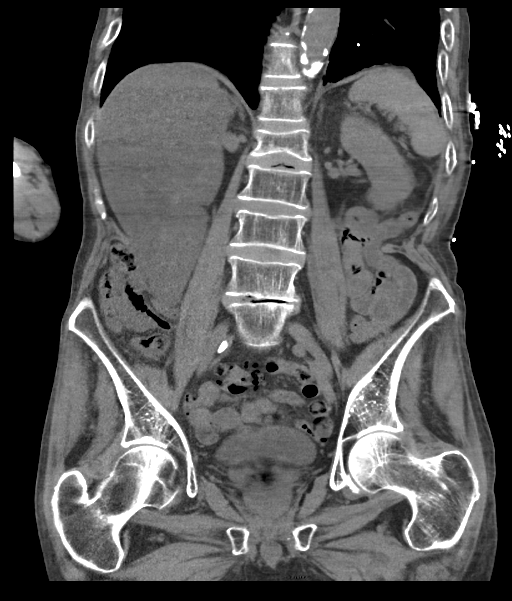

[15 of 46 positions shown; findings below may reference images not displayed]

FINDINGS: Lower chest: Granulomatous changes throughout the chest. Basilar
atelectasis. Trace LEFT-sided effusion. Marked pulmonary emphysema.
Heart with normal size. Three-vessel coronary artery calcification.
No pericardial effusion or nodularity. Heart is incompletely imaged.

Hepatobiliary: Severe hepatic steatosis. No visible, focal
suspicious hepatic lesion. No pericholecystic stranding. No gross
biliary duct distension.

Pancreas: Normal contour without signs of inflammation.

Spleen: Low-density lesion in the spleen is unchanged dating back to
Saturday August, 2020 at 1.7 cm, imaged on the chest CT that was performed in
9599. This displays near water density and is likely a benign cyst
or splenic hemangioma.

Adrenals/Urinary Tract: Adrenal glands are normal. Smooth renal
contours. No hydronephrosis. No nephrolithiasis. No perivesical
stranding.

Stomach/Bowel: Small hiatal hernia. Mild gaseous distension of small
bowel without substantial dilation or adjacent stranding.

Appendix not visible, no secondary signs to suggest acute
appendicitis. Colonic diverticulosis without current evidence of
diverticulitis. Diverticular changes are worse in the sigmoid colon.

Vascular/Lymphatic:

Aortic atherosclerosis. No sign of aneurysm. Smooth contour of the
IVC. There is no gastrohepatic or hepatoduodenal ligament
lymphadenopathy. No retroperitoneal or mesenteric lymphadenopathy.

No pelvic sidewall lymphadenopathy.

Marked atherosclerotic changes of the abdominal aorta. Assessment
limited by lack of intravenous contrast.

Reproductive: Unremarkable by CT.

Other: No pneumoperitoneum or ascites

Musculoskeletal: Osteopenia. And spinal degenerative changes. Loss
of height in the L1 level approximately 40-50% loss of height along
the inferior endplate more anteriorly is unchanged based on
comparison with 2766 imaging.

Acute rib fractures of LEFT-sided posterior ribs 11 and 12 with
displacement. Also LEFT tenth rib with mildly displaced fracture
these are seen posterolateral E and may explain the small LEFT-sided
effusion. This effusion does show low-density.

Subacute fracture of RIGHT twelfth rib and healed fractures of RIGHT
posterolateral ribs and LEFT lateral ribs.
IMPRESSION: 1. Completely displaced fractures of posterior LEFT eleventh and
twelfth ribs with mildly displaced fracture of LEFT tenth rib,
associated with small LEFT-sided effusion.
2. Subacute fractures of RIGHT twelfth rib and other chronic
appearing healed rib fractures compatible with multiple rib
fractures of varying ages. Correlate with any history of recent and
or remote trauma.
3. No nephrolithiasis or hydronephrosis.
4. Severe hepatic steatosis.
5. Colonic diverticulosis without current evidence of
diverticulitis.
6. Small hiatal hernia.
7. Three-vessel coronary artery calcification.
8. Marked atherosclerotic changes of the abdominal aorta.

Aortic Atherosclerosis (U2IOH-IHA.A) and Emphysema (U2IOH-G7H.N).

## 2022-10-24 IMAGING — CT CT HEAD W/O CM
3 of 8 series · 10 of 47 positions shown, 12 images · non-contrast
Comparison: Head CT dated August 30, 2014

CLINICAL DATA: Head trauma



[Series 4: coronal soft · coronal · 0.10mm/px · 3 of 84 slices shown]
[im 21/84  brain]
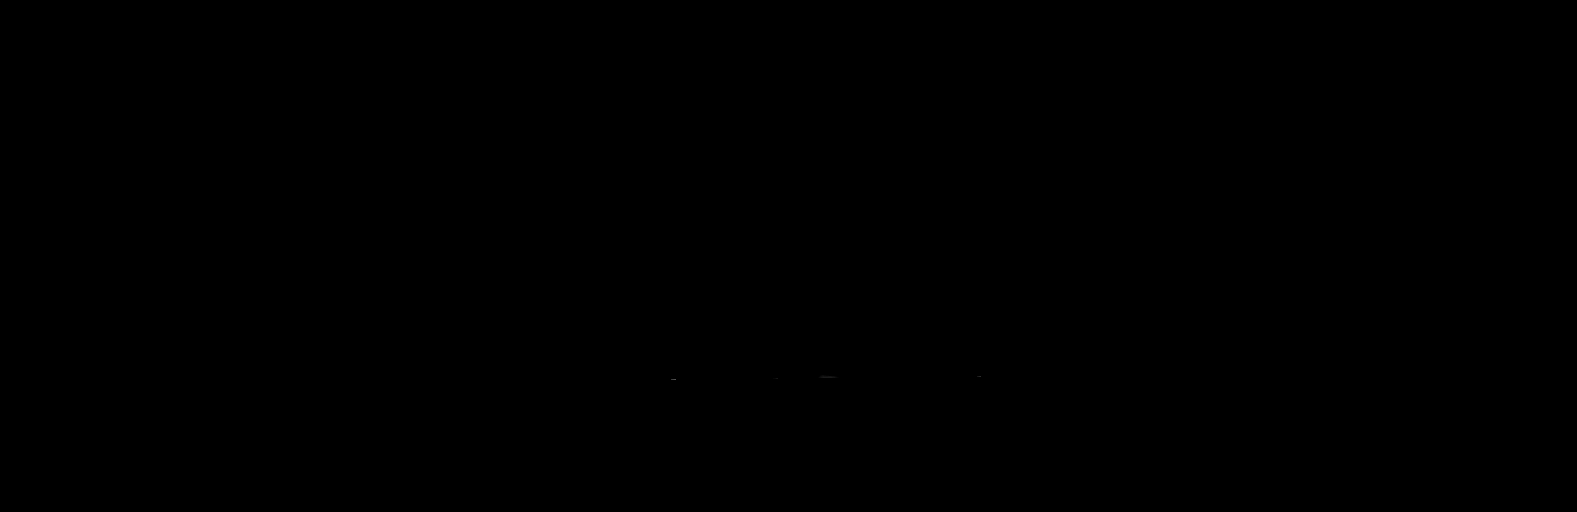
[im 42/84  brain]
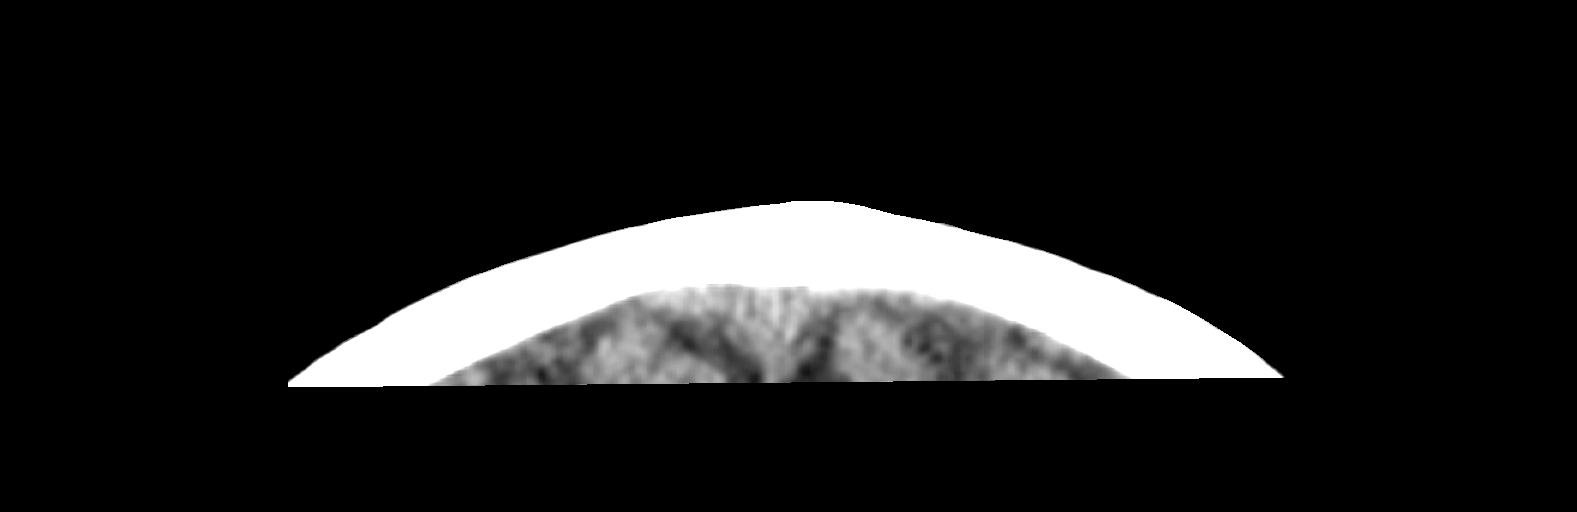
[im 63/84  brain]
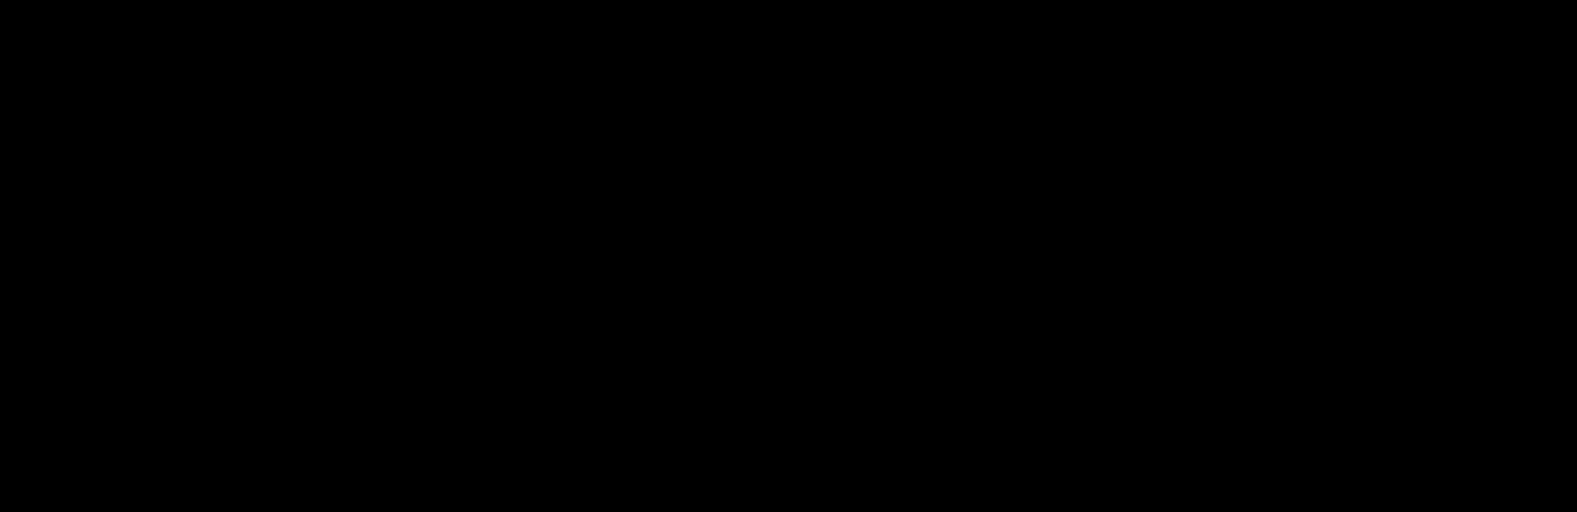

[Series 6: head w o · axial · 0.42mm/px · z∈[+1368,+1468]mm · 5 of 31 slices shown, 7 images]
[im 6/31  brain]
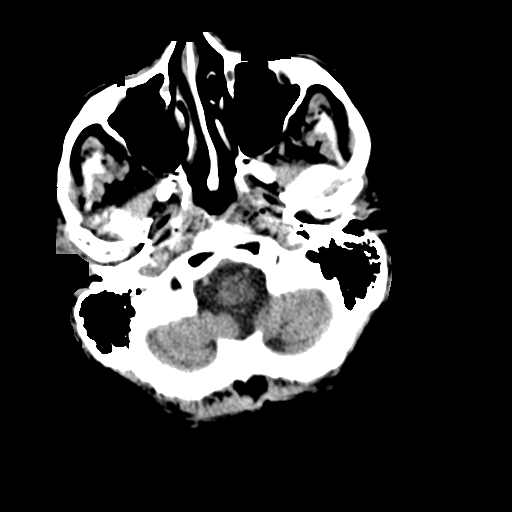
[im 6/31  bone]
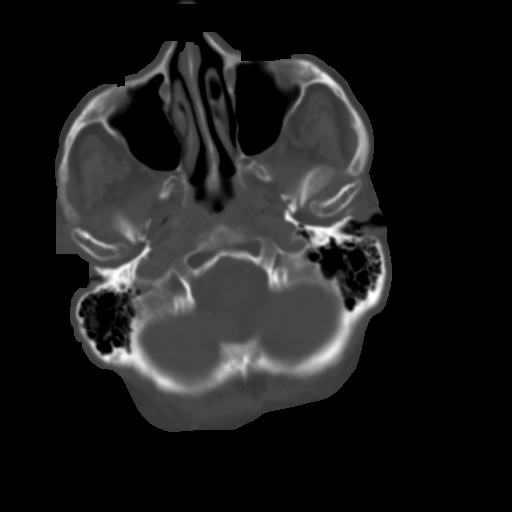
[im 11/31  brain]
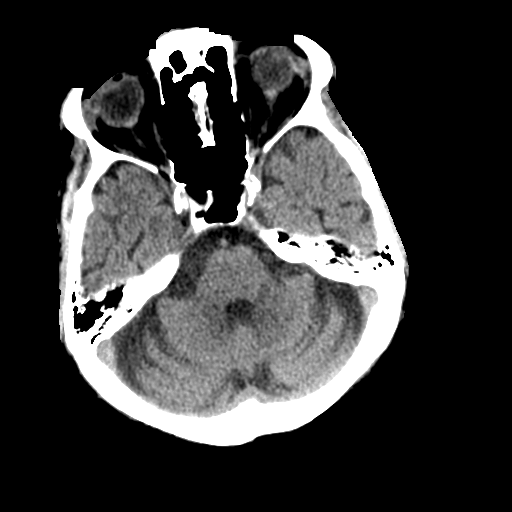
[im 16/31  brain]
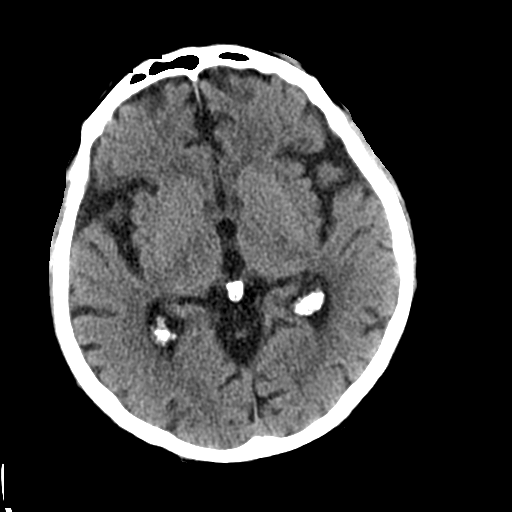
[im 21/31  brain]
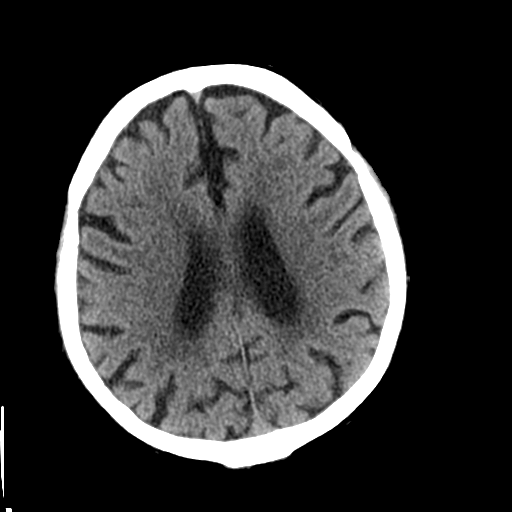
[im 26/31  brain]
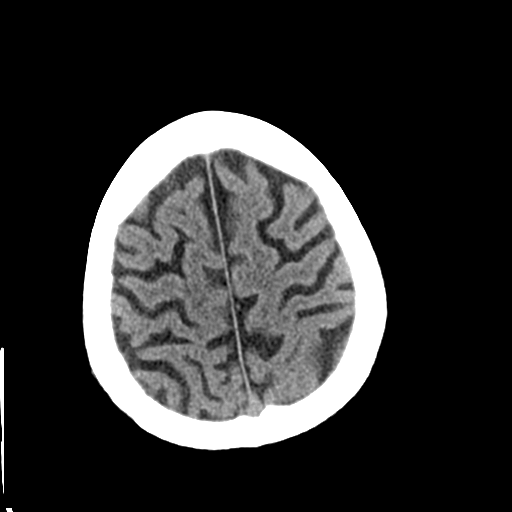
[im 26/31  bone]
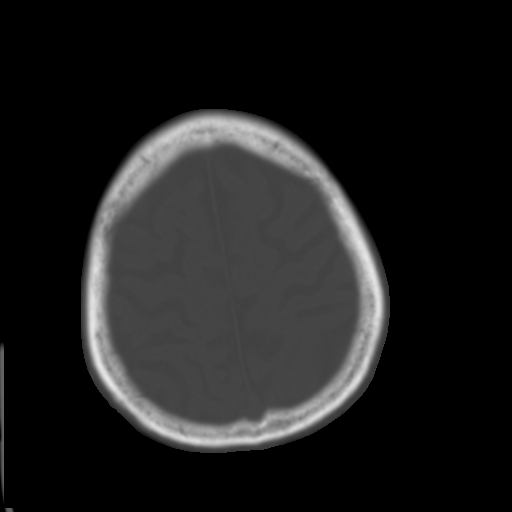

[Series 10: sagittal soft · sagittal · 0.37mm/px · 2 of 65 slices shown]
[im 22/65  brain]
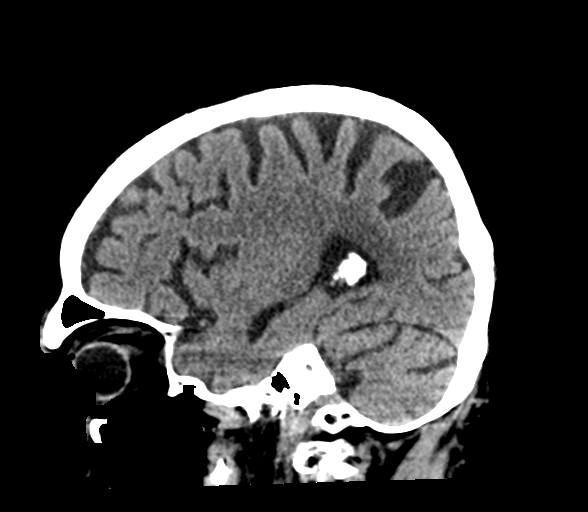
[im 43/65  brain]
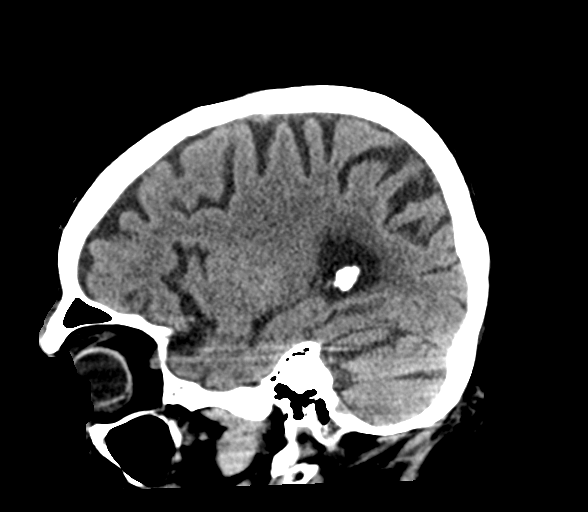

[10 of 47 positions shown; findings below may reference images not displayed]

FINDINGS: Brain: Chronic white matter ischemic change. No evidence of acute
infarction, hemorrhage, hydrocephalus, extra-axial collection or
mass lesion/mass effect.

Vascular: No hyperdense vessel or unexpected calcification.

Skull: Normal. Negative for fracture or focal lesion.

Sinuses/Orbits: No acute finding.

Other: None.
IMPRESSION: No acute intracranial abnormality.

## 2022-10-24 IMAGING — DX DG LUMBAR SPINE COMPLETE 4+V
5 series · 5 of 5 positions shown · non-contrast
Comparison: CT 08/30/2020

CLINICAL DATA: back pain fall

EXAM:
LUMBAR SPINE - COMPLETE 4+ VIEW

[l-spine ap]
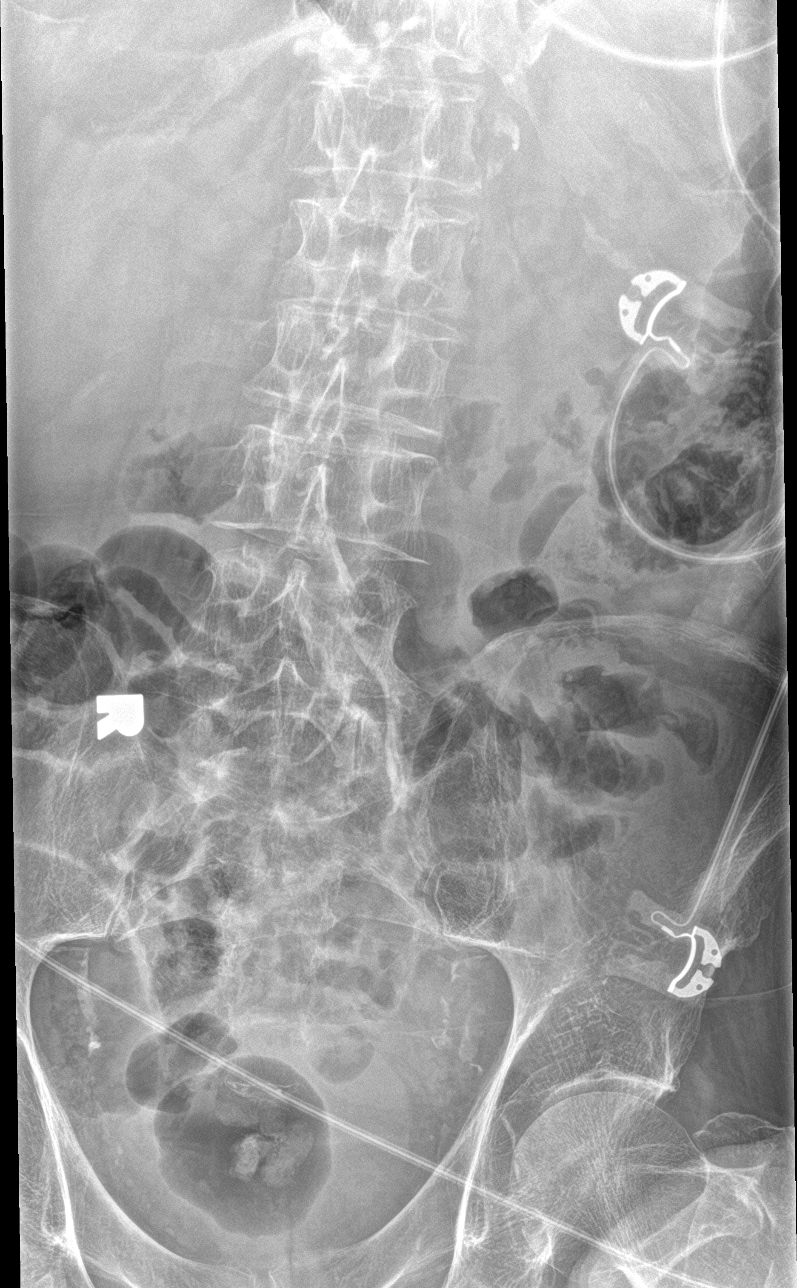

[l-spine obl (1 of 2)]
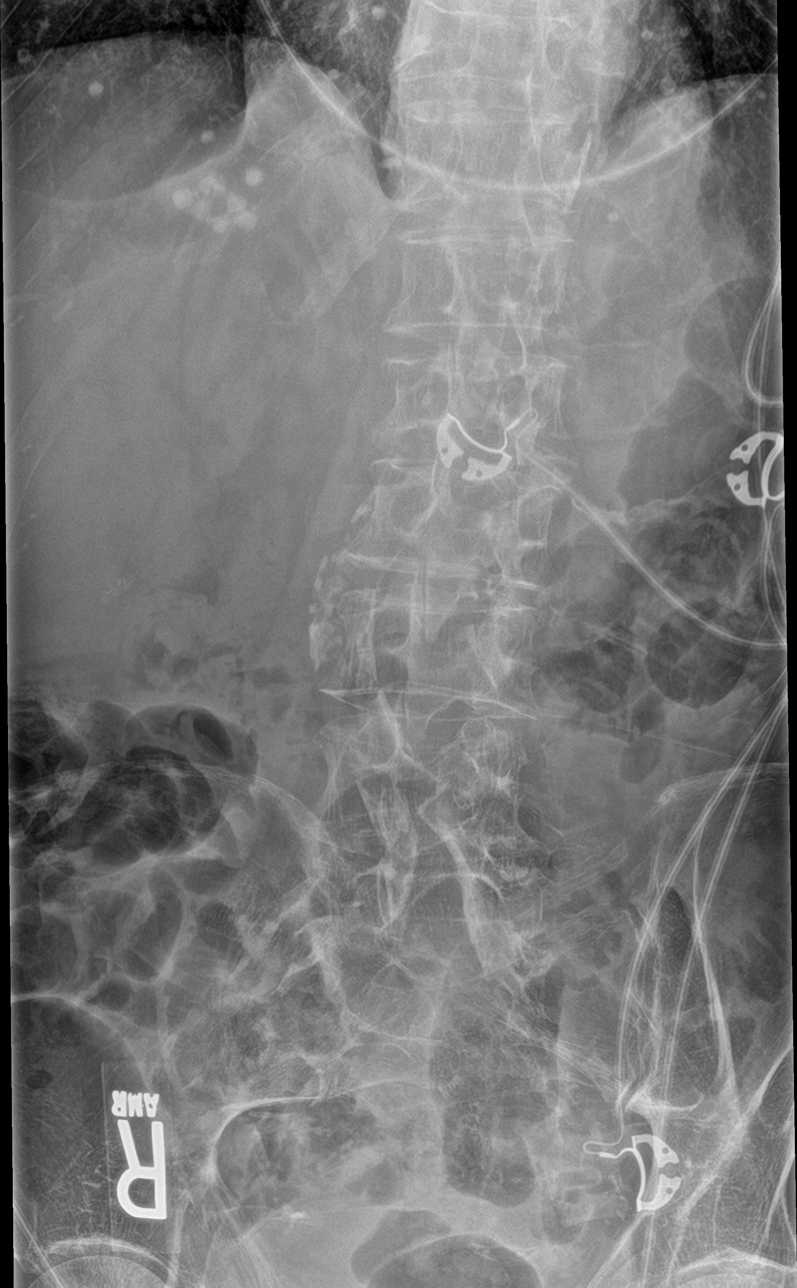

[l-spine obl (2 of 2)]
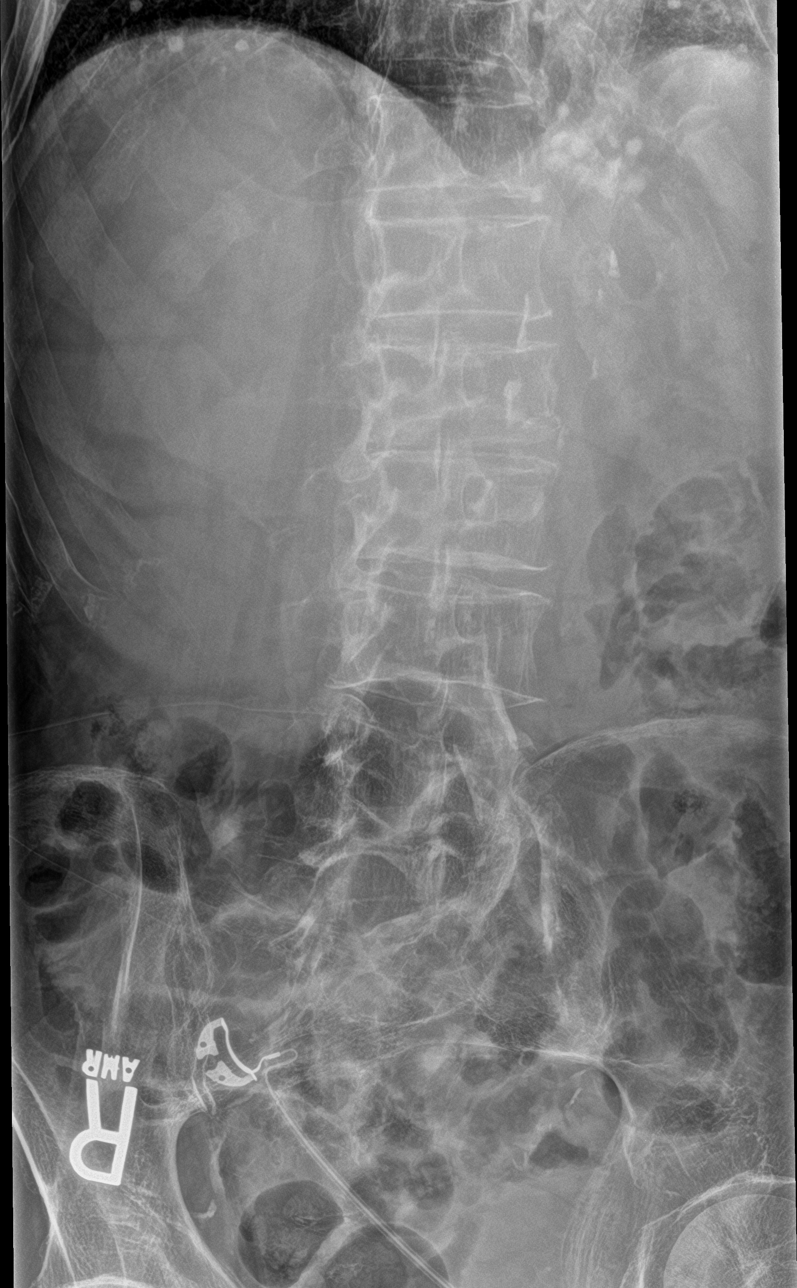

[l-spine lat]
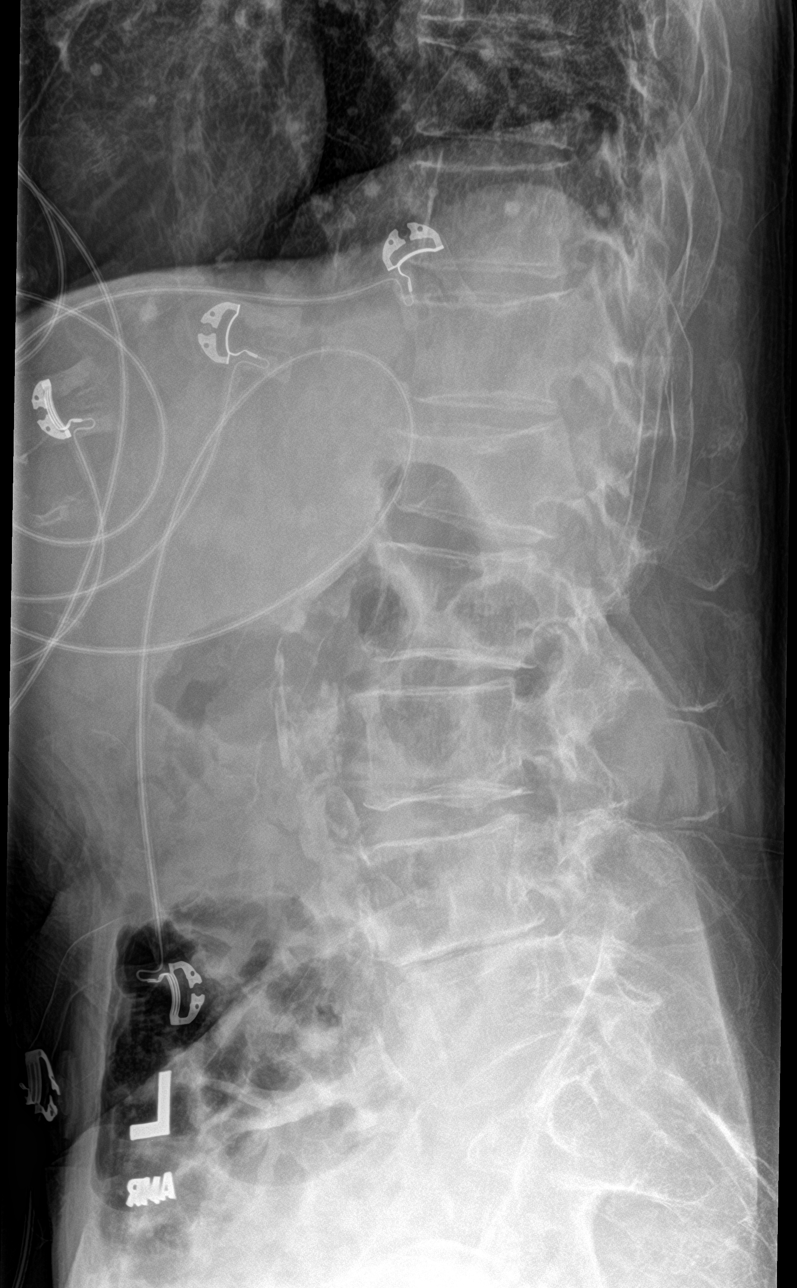

[l-spine spot]
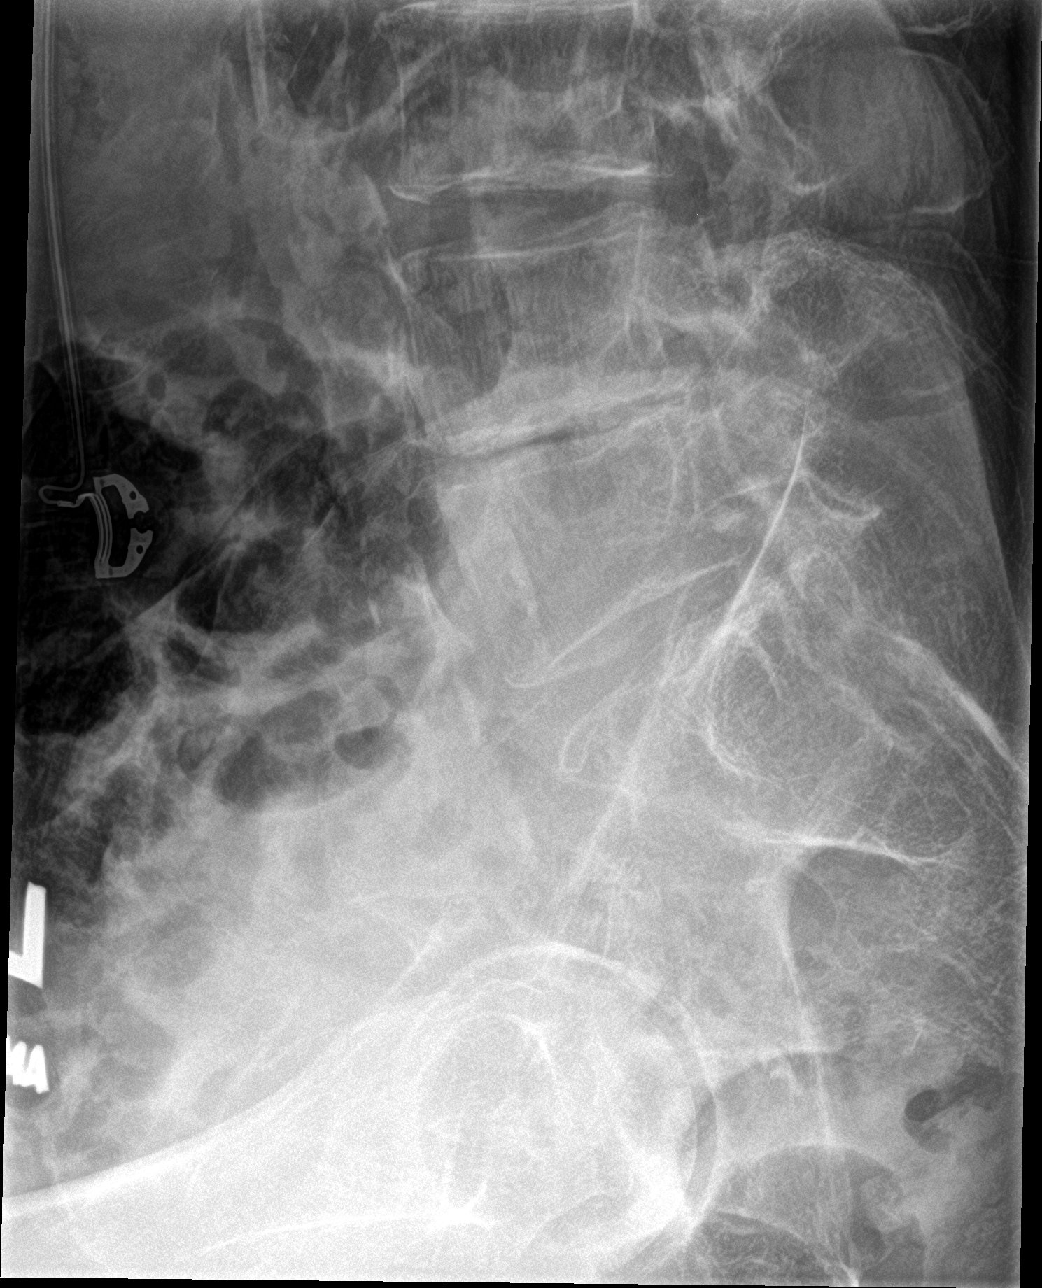

[5 of 5 positions shown; findings below may reference images not displayed]

FINDINGS: Unchanged chronic compression deformity of L1 and mild anterior
wedging of T12. There is no evidence of fracture involving L2
through L5. There is multilevel degenerative disc disease, severe at
L4-L5 with trace, grade 1 anterolisthesis at this level. There is
mild to moderate lower lumbar predominant facet arthropathy.
IMPRESSION: Unchanged chronic compression deformity of L1 and mild anterior
wedging of T12. No evidence of acute lumbar spine fracture.

Multilevel degenerative disc disease, severe at L4-L5. Mild to
moderate lower lumbar predominant facet arthropathy.

## 2023-02-04 ENCOUNTER — Other Ambulatory Visit: Payer: Self-pay

## 2023-02-04 ENCOUNTER — Emergency Department (HOSPITAL_COMMUNITY): Payer: Medicare HMO

## 2023-02-04 ENCOUNTER — Encounter (HOSPITAL_COMMUNITY): Payer: Self-pay | Admitting: *Deleted

## 2023-02-04 ENCOUNTER — Inpatient Hospital Stay (HOSPITAL_COMMUNITY)
Admission: EM | Admit: 2023-02-04 | Discharge: 2023-02-14 | DRG: 480 | Disposition: A | Discharge status: Skilled Nursing Facility | Payer: Medicare HMO | Attending: Internal Medicine | Admitting: Internal Medicine

## 2023-02-04 DIAGNOSIS — F17210 Nicotine dependence, cigarettes, uncomplicated: Secondary | ICD-10-CM | POA: Diagnosis present

## 2023-02-04 DIAGNOSIS — S72001A Fracture of unspecified part of neck of right femur, initial encounter for closed fracture: Principal | ICD-10-CM | POA: Diagnosis present

## 2023-02-04 DIAGNOSIS — F1010 Alcohol abuse, uncomplicated: Secondary | ICD-10-CM | POA: Diagnosis not present

## 2023-02-04 DIAGNOSIS — Z8349 Family history of other endocrine, nutritional and metabolic diseases: Secondary | ICD-10-CM | POA: Diagnosis not present

## 2023-02-04 DIAGNOSIS — Z86718 Personal history of other venous thrombosis and embolism: Secondary | ICD-10-CM

## 2023-02-04 DIAGNOSIS — Z681 Body mass index (BMI) 19 or less, adult: Secondary | ICD-10-CM | POA: Diagnosis not present

## 2023-02-04 DIAGNOSIS — D71 Functional disorders of polymorphonuclear neutrophils: Secondary | ICD-10-CM | POA: Diagnosis not present

## 2023-02-04 DIAGNOSIS — N400 Enlarged prostate without lower urinary tract symptoms: Secondary | ICD-10-CM | POA: Diagnosis present

## 2023-02-04 DIAGNOSIS — D62 Acute posthemorrhagic anemia: Secondary | ICD-10-CM

## 2023-02-04 DIAGNOSIS — S79911A Unspecified injury of right hip, initial encounter: Secondary | ICD-10-CM | POA: Diagnosis not present

## 2023-02-04 DIAGNOSIS — J60 Coalworker's pneumoconiosis: Secondary | ICD-10-CM | POA: Diagnosis present

## 2023-02-04 DIAGNOSIS — R64 Cachexia: Secondary | ICD-10-CM | POA: Diagnosis present

## 2023-02-04 DIAGNOSIS — W010XXA Fall on same level from slipping, tripping and stumbling without subsequent striking against object, initial encounter: Secondary | ICD-10-CM | POA: Diagnosis present

## 2023-02-04 DIAGNOSIS — M85811 Other specified disorders of bone density and structure, right shoulder: Secondary | ICD-10-CM | POA: Diagnosis not present

## 2023-02-04 DIAGNOSIS — E871 Hypo-osmolality and hyponatremia: Secondary | ICD-10-CM | POA: Diagnosis not present

## 2023-02-04 DIAGNOSIS — S72141A Displaced intertrochanteric fracture of right femur, initial encounter for closed fracture: Principal | ICD-10-CM | POA: Diagnosis present

## 2023-02-04 DIAGNOSIS — M25572 Pain in left ankle and joints of left foot: Secondary | ICD-10-CM | POA: Diagnosis not present

## 2023-02-04 DIAGNOSIS — E43 Unspecified severe protein-calorie malnutrition: Secondary | ICD-10-CM | POA: Diagnosis present

## 2023-02-04 DIAGNOSIS — M549 Dorsalgia, unspecified: Secondary | ICD-10-CM | POA: Diagnosis not present

## 2023-02-04 DIAGNOSIS — I10 Essential (primary) hypertension: Secondary | ICD-10-CM | POA: Diagnosis not present

## 2023-02-04 DIAGNOSIS — D539 Nutritional anemia, unspecified: Secondary | ICD-10-CM | POA: Diagnosis present

## 2023-02-04 DIAGNOSIS — S0990XA Unspecified injury of head, initial encounter: Secondary | ICD-10-CM | POA: Diagnosis not present

## 2023-02-04 DIAGNOSIS — Z741 Need for assistance with personal care: Secondary | ICD-10-CM | POA: Diagnosis not present

## 2023-02-04 DIAGNOSIS — Z833 Family history of diabetes mellitus: Secondary | ICD-10-CM

## 2023-02-04 DIAGNOSIS — S43004A Unspecified dislocation of right shoulder joint, initial encounter: Secondary | ICD-10-CM | POA: Diagnosis not present

## 2023-02-04 DIAGNOSIS — J449 Chronic obstructive pulmonary disease, unspecified: Secondary | ICD-10-CM | POA: Diagnosis not present

## 2023-02-04 DIAGNOSIS — Z9889 Other specified postprocedural states: Secondary | ICD-10-CM | POA: Diagnosis not present

## 2023-02-04 DIAGNOSIS — R609 Edema, unspecified: Secondary | ICD-10-CM | POA: Diagnosis not present

## 2023-02-04 DIAGNOSIS — Z79899 Other long term (current) drug therapy: Secondary | ICD-10-CM | POA: Diagnosis not present

## 2023-02-04 DIAGNOSIS — Y92410 Unspecified street and highway as the place of occurrence of the external cause: Secondary | ICD-10-CM

## 2023-02-04 DIAGNOSIS — Z043 Encounter for examination and observation following other accident: Secondary | ICD-10-CM | POA: Diagnosis not present

## 2023-02-04 DIAGNOSIS — W19XXXA Unspecified fall, initial encounter: Secondary | ICD-10-CM

## 2023-02-04 DIAGNOSIS — M6281 Muscle weakness (generalized): Secondary | ICD-10-CM | POA: Diagnosis not present

## 2023-02-04 DIAGNOSIS — E876 Hypokalemia: Secondary | ICD-10-CM | POA: Diagnosis not present

## 2023-02-04 DIAGNOSIS — R2689 Other abnormalities of gait and mobility: Secondary | ICD-10-CM | POA: Diagnosis not present

## 2023-02-04 DIAGNOSIS — Y92009 Unspecified place in unspecified non-institutional (private) residence as the place of occurrence of the external cause: Secondary | ICD-10-CM | POA: Diagnosis not present

## 2023-02-04 DIAGNOSIS — Z23 Encounter for immunization: Secondary | ICD-10-CM

## 2023-02-04 DIAGNOSIS — R220 Localized swelling, mass and lump, head: Secondary | ICD-10-CM | POA: Diagnosis not present

## 2023-02-04 DIAGNOSIS — J64 Unspecified pneumoconiosis: Secondary | ICD-10-CM | POA: Diagnosis not present

## 2023-02-04 DIAGNOSIS — E039 Hypothyroidism, unspecified: Secondary | ICD-10-CM | POA: Diagnosis not present

## 2023-02-04 DIAGNOSIS — R627 Adult failure to thrive: Secondary | ICD-10-CM | POA: Diagnosis not present

## 2023-02-04 DIAGNOSIS — I6782 Cerebral ischemia: Secondary | ICD-10-CM | POA: Diagnosis not present

## 2023-02-04 DIAGNOSIS — M8580 Other specified disorders of bone density and structure, unspecified site: Secondary | ICD-10-CM | POA: Diagnosis not present

## 2023-02-04 DIAGNOSIS — M25551 Pain in right hip: Secondary | ICD-10-CM | POA: Diagnosis not present

## 2023-02-04 DIAGNOSIS — S72141D Displaced intertrochanteric fracture of right femur, subsequent encounter for closed fracture with routine healing: Secondary | ICD-10-CM | POA: Diagnosis not present

## 2023-02-04 LAB — COMPREHENSIVE METABOLIC PANEL
ALT: 34 U/L (ref 0–44)
AST: 78 U/L — ABNORMAL HIGH (ref 15–41)
Albumin: 3.8 g/dL (ref 3.5–5.0)
Alkaline Phosphatase: 86 U/L (ref 38–126)
Anion gap: 10 (ref 5–15)
BUN: 10 mg/dL (ref 8–23)
CO2: 28 mmol/L (ref 22–32)
Calcium: 8.7 mg/dL — ABNORMAL LOW (ref 8.9–10.3)
Chloride: 95 mmol/L — ABNORMAL LOW (ref 98–111)
Creatinine, Ser: 0.64 mg/dL (ref 0.61–1.24)
GFR, Estimated: >60 mL/min (ref >60)
Glucose, Bld: 107 mg/dL — ABNORMAL HIGH (ref 70–99)
Potassium: 3.3 mmol/L — ABNORMAL LOW (ref 3.5–5.1)
Sodium: 133 mmol/L — ABNORMAL LOW (ref 135–145)
Total Bilirubin: 1.3 mg/dL — ABNORMAL HIGH (ref 0.3–1.2)
Total Protein: 6.3 g/dL — ABNORMAL LOW (ref 6.5–8.1)

## 2023-02-04 LAB — MAGNESIUM: Magnesium: 1.6 mg/dL — ABNORMAL LOW (ref 1.7–2.4)

## 2023-02-04 LAB — CBC WITH DIFFERENTIAL/PLATELET
Abs Immature Granulocytes: 0 10*3/uL (ref 0.00–0.07)
Basophils Absolute: 0 10*3/uL (ref 0.0–0.1)
Basophils Relative: 1 %
Eosinophils Absolute: 0 10*3/uL (ref 0.0–0.5)
Eosinophils Relative: 1 %
HCT: 33.8 % — ABNORMAL LOW (ref 39.0–52.0)
Hemoglobin: 11.8 g/dL — ABNORMAL LOW (ref 13.0–17.0)
Immature Granulocytes: 0 %
Lymphocytes Relative: 31 %
Lymphs Abs: 1.1 10*3/uL (ref 0.7–4.0)
MCH: 35.1 pg — ABNORMAL HIGH (ref 26.0–34.0)
MCHC: 34.9 g/dL (ref 30.0–36.0)
MCV: 100.6 fL — ABNORMAL HIGH (ref 80.0–100.0)
Monocytes Absolute: 0.4 10*3/uL (ref 0.1–1.0)
Monocytes Relative: 12 %
Neutro Abs: 2.1 10*3/uL (ref 1.7–7.7)
Neutrophils Relative %: 55 %
Platelets: 159 10*3/uL (ref 150–400)
RBC: 3.36 MIL/uL — ABNORMAL LOW (ref 4.22–5.81)
RDW: 14.6 % (ref 11.5–15.5)
WBC: 3.7 10*3/uL — ABNORMAL LOW (ref 4.0–10.5)
nRBC: 0 % (ref 0.0–0.2)

## 2023-02-04 LAB — ETHANOL: Alcohol, Ethyl (B): <10 mg/dL (ref <10)

## 2023-02-04 LAB — URINALYSIS, ROUTINE W REFLEX MICROSCOPIC
Bilirubin Urine: NEGATIVE
Glucose, UA: NEGATIVE mg/dL
Hgb urine dipstick: NEGATIVE
Ketones, ur: 20 mg/dL — AB
Nitrite: NEGATIVE
Protein, ur: 30 mg/dL — AB
Specific Gravity, Urine: 1.013 (ref 1.005–1.030)
pH: 9 — ABNORMAL HIGH (ref 5.0–8.0)

## 2023-02-04 LAB — PROTIME-INR
INR: 1 (ref 0.8–1.2)
Prothrombin Time: 12.9 s (ref 11.4–15.2)

## 2023-02-04 LAB — APTT: aPTT: 25 s (ref 24–36)

## 2023-02-04 LAB — PHOSPHORUS: Phosphorus: 3.6 mg/dL (ref 2.5–4.6)

## 2023-02-04 MED ORDER — HEPARIN SODIUM (PORCINE) 5000 UNIT/ML IJ SOLN
5000.0000 [IU] | Freq: Once | INTRAMUSCULAR | Status: AC
  Administered 2023-02-04: 5000 [IU] via SUBCUTANEOUS
  Filled 2023-02-04: qty 1

## 2023-02-04 MED ORDER — METHOCARBAMOL 1000 MG/10ML IJ SOLN: 500.0000 mg | Freq: Four times a day (QID) | INTRAVENOUS | Status: DC | PRN

## 2023-02-04 MED ORDER — NICOTINE 7 MG/24HR TD PT24: 7.0000 mg | MEDICATED_PATCH | Freq: Every day | TRANSDERMAL | Status: DC

## 2023-02-04 MED ORDER — AMLODIPINE BESYLATE 5 MG PO TABS
5.0000 mg | ORAL_TABLET | Freq: Every day | ORAL | Status: DC
  Administered 2023-02-05 – 2023-02-13 (×9): 5 mg via ORAL
  Filled 2023-02-04 (×9): qty 1

## 2023-02-04 MED ORDER — MORPHINE SULFATE (PF) 2 MG/ML IV SOLN
0.5000 mg | INTRAVENOUS | Status: DC | PRN
  Administered 2023-02-04: 0.5 mg via INTRAVENOUS
  Filled 2023-02-04: qty 1

## 2023-02-04 MED ORDER — FOLIC ACID 1 MG PO TABS
1.0000 mg | ORAL_TABLET | Freq: Every day | ORAL | Status: DC
  Administered 2023-02-05 – 2023-02-13 (×9): 1 mg via ORAL
  Filled 2023-02-04 (×9): qty 1

## 2023-02-04 MED ORDER — LORAZEPAM 1 MG PO TABS
1.0000 mg | ORAL_TABLET | ORAL | Status: AC | PRN
  Administered 2023-02-04 – 2023-02-05 (×2): 1 mg via ORAL
  Filled 2023-02-04 (×2): qty 1

## 2023-02-04 MED ORDER — ONDANSETRON HCL 4 MG/2ML IJ SOLN
4.0000 mg | Freq: Once | INTRAMUSCULAR | Status: AC
  Administered 2023-02-04: 4 mg via INTRAVENOUS
  Filled 2023-02-04: qty 2

## 2023-02-04 MED ORDER — LORAZEPAM 1 MG PO TABS: 1.0000 mg | ORAL_TABLET | Freq: Four times a day (QID) | ORAL | Status: DC

## 2023-02-04 MED ORDER — LORAZEPAM 2 MG/ML IJ SOLN
1.0000 mg | INTRAMUSCULAR | Status: DC | PRN
  Administered 2023-02-06: 4 mg via INTRAVENOUS
  Filled 2023-02-04: qty 2

## 2023-02-04 MED ORDER — POTASSIUM CHLORIDE CRYS ER 20 MEQ PO TBCR
40.0000 meq | EXTENDED_RELEASE_TABLET | Freq: Once | ORAL | Status: DC
Start: 2023-02-04 — End: 2023-02-04

## 2023-02-04 MED ORDER — MORPHINE SULFATE (PF) 4 MG/ML IV SOLN
4.0000 mg | Freq: Once | INTRAVENOUS | Status: AC
  Administered 2023-02-04: 4 mg via INTRAVENOUS
  Filled 2023-02-04: qty 1

## 2023-02-04 MED ORDER — THIAMINE MONONITRATE 100 MG PO TABS
100.0000 mg | ORAL_TABLET | Freq: Every day | ORAL | Status: DC
  Administered 2023-02-05 – 2023-02-13 (×9): 100 mg via ORAL
  Filled 2023-02-04 (×9): qty 1

## 2023-02-04 MED ORDER — POLYETHYLENE GLYCOL 3350 17 G PO PACK
17.0000 g | PACK | Freq: Every day | ORAL | Status: DC | PRN
  Administered 2023-02-09: 17 g via ORAL
  Filled 2023-02-04: qty 1

## 2023-02-04 MED ORDER — HYDROMORPHONE HCL 1 MG/ML IJ SOLN
0.5000 mg | Freq: Once | INTRAMUSCULAR | Status: AC
  Administered 2023-02-04: 0.5 mg via INTRAVENOUS
  Filled 2023-02-04: qty 0.5

## 2023-02-04 MED ORDER — THIAMINE HCL 100 MG/ML IJ SOLN
100.0000 mg | Freq: Every day | INTRAMUSCULAR | Status: DC
  Administered 2023-02-04: 100 mg via INTRAVENOUS
  Filled 2023-02-04 (×3): qty 2

## 2023-02-04 MED ORDER — NICOTINE 7 MG/24HR TD PT24
7.0000 mg | MEDICATED_PATCH | Freq: Every day | TRANSDERMAL | Status: DC
  Administered 2023-02-05 – 2023-02-13 (×10): 7 mg via TRANSDERMAL
  Filled 2023-02-04 (×11): qty 1

## 2023-02-04 MED ORDER — HYDROMORPHONE HCL 1 MG/ML IJ SOLN
0.5000 mg | INTRAMUSCULAR | Status: DC | PRN
  Administered 2023-02-04 – 2023-02-06 (×4): 0.5 mg via INTRAVENOUS
  Filled 2023-02-04 (×4): qty 0.5

## 2023-02-04 MED ORDER — HYDROCODONE-ACETAMINOPHEN 5-325 MG PO TABS
1.0000 | ORAL_TABLET | Freq: Four times a day (QID) | ORAL | Status: DC | PRN
  Administered 2023-02-05: 2 via ORAL
  Administered 2023-02-05: 1 via ORAL
  Filled 2023-02-04: qty 2
  Filled 2023-02-04: qty 1

## 2023-02-04 MED ORDER — METHOCARBAMOL 500 MG PO TABS
500.0000 mg | ORAL_TABLET | Freq: Four times a day (QID) | ORAL | Status: DC | PRN
  Administered 2023-02-08: 500 mg via ORAL
  Filled 2023-02-04: qty 1

## 2023-02-04 MED ORDER — ADULT MULTIVITAMIN W/MINERALS CH
1.0000 | ORAL_TABLET | Freq: Every day | ORAL | Status: DC
  Administered 2023-02-05 – 2023-02-13 (×9): 1 via ORAL
  Filled 2023-02-04 (×9): qty 1

## 2023-02-04 MED ORDER — POTASSIUM CHLORIDE IN NACL 20-0.9 MEQ/L-% IV SOLN
INTRAVENOUS | Status: AC
  Filled 2023-02-04: qty 1000

## 2023-02-04 MED ORDER — POTASSIUM CHLORIDE CRYS ER 20 MEQ PO TBCR
20.0000 meq | EXTENDED_RELEASE_TABLET | Freq: Once | ORAL | Status: AC
  Administered 2023-02-04: 20 meq via ORAL
  Filled 2023-02-04: qty 1

## 2023-02-04 MED ORDER — TETANUS-DIPHTH-ACELL PERTUSSIS 5-2.5-18.5 LF-MCG/0.5 IM SUSY
0.5000 mL | PREFILLED_SYRINGE | Freq: Once | INTRAMUSCULAR | Status: AC
  Administered 2023-02-04: 0.5 mL via INTRAMUSCULAR
  Filled 2023-02-04: qty 0.5

## 2023-02-04 MED ORDER — LORAZEPAM 1 MG PO TABS: 0.0000 mg | ORAL_TABLET | Freq: Four times a day (QID) | ORAL | Status: DC

## 2023-02-04 NOTE — Assessment & Plan Note (Addendum)
Drinks half of 1/5 of vodka daily.  Last alcoholic beverage was this morning at about 10 AM.  Blood alcohol level unremarkable.  Prior hospitalization for alcohol withdrawal,- 11/2021 patient did well with CIWA protocol. -CIWA as needed -Thiamine, folate, multivitamins -Check mag, Phos  02-06-2023 pt received 4 mg IV push ativan. Pt is now somnolent and requiring supplemental O2. 02-12-2023 has been without alcohol for several days now. Last alcoholic beverage was on 02-09-2023 around 11 AM. 02-13-2023 stable.

## 2023-02-04 NOTE — Assessment & Plan Note (Addendum)
Reports orthostatic dizziness with resultant fall and subsequent hip fracture.  Head CT-soft tissue swelling left frontal scalp. Fall in the setting of chronic alcohol abuse.  Patient reports poor oral intake over the past 2 to 3 weeks.  Tachycardic heart rate 94-104 dizziness is not new. - N/s + 20kcl 75cc/hr x 12hrs

## 2023-02-04 NOTE — ED Triage Notes (Addendum)
[place] [facility] [place] fell onto asphault getting out of his truck. Reports recent dizziness, and recent falls. Describes this fall as mechanical. Denies LOC. Endorses R head, elbow and hip pain. Shortening and rotation noted. Abrasion, contusion, and hematoma noted to R elbow and R forehead. Alert, NAD, calm, interactive. VSS. CMS intact. Skin not intact, and ROM limited. H/o alcohol abuse. No blood thinners.

## 2023-02-04 NOTE — H&P (Signed)
History and Physical    Larry [name] ZOX:[id number] DOB: 03-Aug-1950 DOA: 02/04/2023  PCP: Benita Stabile, MD  Patient coming from: Home  I have personally briefly reviewed patient's old medical records in [facility]  Chief Complaint: Fall  HPI: Larry [name] is a 71 y.o. male with medical history significant for alcohol abuse, COPD, hypertension, BPH, pneumoconiosis,  tobacco abuse Patient presented to the ED reports of a fall.   Patient was trying to use the ATM  the bank across the street, he got out of his truck, and fell on the street.  He reports dizziness over the past several days present when standing, he reports feeling unwell, with poor oral intake over the past 2 to 3 weeks.  No vomiting.  Reports occasional diarrhea.  Reports chronic difficulty voiding.  No chest pain.  Chronic and unchanged difficulty breathing. Drinks alcohol daily, he drinks about half of 1/5 of vodka.  He reports his last drink was today at about 10 AM.  He reports he drank a small amount, compared to what he normally drinks.  Reports a history of DTs requiring hospitalization.  ED Course: Temperature 98.2.  Heart rate 92-104.  Respiratory rate 11 -23.  Pressure systolic 117-1 60s.  O2 sats greater than 95% on room air. Chest x-ray negative for acute abnormality.  UA with trace leukocytes. Head CT shows soft tissue swelling left frontal scalp. Pelvic x-ray shows comminuted right femoral intertrochanteric fracture. Right shoulder x-ray-possibly chronic inferior dislocation of right femoral head. EDP Dr. Charlann Boxer, recommended admission to [facility] will see in consult likely surgery 10/14 or 10/15.  Review of Systems: As per HPI all other systems reviewed and negative.  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   Hypertension    Shingles    Shortness of breath dyspnea    SOB with exertion   Thyroid  disease    hypothyroidism    Past Surgical History:  Procedure Laterality Date   CATARACT EXTRACTION W/ INTRAOCULAR LENS  IMPLANT, BILATERAL     KNEE SURGERY     ORIF HUMERUS FRACTURE Right 09/24/2014   Procedure: OPEN REDUCTION INTERNAL FIXATION (ORIF) PROXIMAL HUMERUS FRACTURE;  Surgeon: Jones Broom, MD;  Location: [facility] OR;  Service: Orthopedics;  Laterality: Right;   TONSILLECTOMY       reports that he has been smoking cigarettes. He has never used smokeless tobacco. He reports current alcohol use of about 10.0 standard drinks of alcohol per week. He reports that he does not use drugs.  No Known Allergies  Family History  Problem Relation Age of Onset   Thyroid disease Mother    Diabetes Other     Prior to Admission medications   Medication Sig Start Date End Date Taking? Authorizing Provider  Acetaminophen (TYLENOL PO) Take 2 tablets by mouth 2 (two) times daily as needed (pain, fever, headache).   Yes [provider]  amLODipine (NORVASC) 5 MG tablet Take 5 mg by mouth daily.   Yes [provider]  Ascorbic Acid (VITAMIN C PO) Take 1 tablet by mouth daily.   Yes [provider]  Cholecalciferol (VITAMIN D-3 PO) Take 1 capsule by mouth daily.   Yes [provider]  OVER THE COUNTER MEDICATION Take 1 tablet by mouth daily. Vitamin B12 + zinc   Yes [provider]    Physical Exam:  Vitals:   02/04/23 1507 02/04/23 1715 02/04/23 1813 02/04/23 1900  BP: (!) 146/74 137/81 (!) 140/79 (!) 155/83  Pulse: (!) 104 94 97 98  Resp:  18 17 13   Temp:   98.5 F (36.9 C)   SpO2:  95% 97% 97%  Weight:        Constitutional: Thin, calm, comfortable Vitals:   02/04/23 1507 02/04/23 1715 02/04/23 1813 02/04/23 1900  BP: (!) 146/74 137/81 (!) 140/79 (!) 155/83  Pulse: (!) 104 94 97 98  Resp:  18 17 13   Temp:   98.5 F (36.9 C)   SpO2:  95% 97% 97%  Weight:       Eyes: PERRL, lids and conjunctivae normal ENMT: Mucous membranes are  moist.   Neck: normal, supple, no masses, no thyromegaly Respiratory: clear to auscultation bilaterally, no wheezing, no crackles. Normal respiratory effort. No accessory muscle use.  Cardiovascular: Regular rate and rhythm, no murmurs / rubs / gallops. No extremity edema.  Extremities warm Abdomen: no tenderness, no masses palpated. No hepatosplenomegaly. Bowel sounds positive.  Musculoskeletal: no clubbing / cyanosis.  Fracture of right hip.  Mild tremors to hands. Skin: no rashes, lesions, ulcers. No induration Neurologic: Right lower extremity not tested.  Moving all extremities spontaneously.  No facial asymmetry. Psychiatric: Normal judgment and insight. Alert and oriented x 3. Normal mood.   Labs on Admission: I have personally reviewed following labs and imaging studies  CBC: Recent Labs  Lab 02/04/23 1455  WBC 3.7*  NEUTROABS 2.1  HGB 11.8*  HCT 33.8*  MCV 100.6*  PLT 159   Basic Metabolic Panel: Recent Labs  Lab 02/04/23 1455  NA 133*  K 3.3*  CL 95*  CO2 28  GLUCOSE 107*  BUN 10  CREATININE 0.64  CALCIUM 8.7*   GFR: CrCl cannot be calculated (Unknown ideal weight.). Liver Function Tests: Recent Labs  Lab 02/04/23 1455  AST 78*  ALT 34  ALKPHOS 86  BILITOT 1.3*  PROT 6.3*  ALBUMIN 3.8    Coagulation Profile: Recent Labs  Lab 02/04/23 1515  INR 1.0   Urine analysis:    Component Value Date/Time   COLORURINE YELLOW 02/04/2023 1714   APPEARANCEUR CLOUDY (A) 02/04/2023 1714   LABSPEC 1.013 02/04/2023 1714   PHURINE 9.0 (H) 02/04/2023 1714   GLUCOSEU NEGATIVE 02/04/2023 1714   HGBUR NEGATIVE 02/04/2023 1714   BILIRUBINUR NEGATIVE 02/04/2023 1714   KETONESUR 20 (A) 02/04/2023 1714   PROTEINUR 30 (A) 02/04/2023 1714   NITRITE NEGATIVE 02/04/2023 1714   LEUKOCYTESUR TRACE (A) 02/04/2023 1714    Radiological Exams on Admission: DG Chest Portable 1 View  Result Date: 02/04/2023 CLINICAL DATA:  Fall.  Right hip fracture. EXAM: PORTABLE CHEST  1 VIEW COMPARISON:  Chest x-ray dated March 20, 2022. FINDINGS: The heart size and mediastinal contours are within normal limits. Normal pulmonary vascularity. Lungs remain hyperinflated with emphysematous changes. Scattered sequelae of prior granulomatous disease in the mediastinum, hila, and both lungs, unchanged. No focal consolidation, pleural effusion, or pneumothorax. No acute osseous abnormality. Chronically dislocated right shoulder. Old bilateral rib and left distal clavicle fractures. Multiple chronic thoracic compression deformities. IMPRESSION: 1. No active disease. 2. COPD.  Old granulomatous disease. Electronically Signed   By: Obie Dredge M.D.   On: 02/04/2023 17:32   DG Shoulder Right  Result Date: 02/04/2023 CLINICAL DATA:  Fall EXAM: RIGHT SHOULDER - 2+ VIEW COMPARISON:  Chest x-ray 03/20/2022 FINDINGS: There is inferior dislocation of the humeral head. This appears  similar to chest x-ray 03/20/2022. There is no acute fracture. There is proximal humeral sideplate and screws. Exostosis along the medial aspect of the humeral head appears unchanged from the prior examination. The bones are diffusely osteopenic. Granulomatous disease noted in the lungs, unchanged. IMPRESSION: 1. Inferior dislocation of the right humeral head, possibly chronic. No acute fractures identified. 2. Osteopenia. Electronically Signed   By: Darliss Cheney M.D.   On: 02/04/2023 17:30   DG Hip Unilat W or Wo Pelvis 2-3 Views Right  Result Date: 02/04/2023 CLINICAL DATA:  Fall. EXAM: DG HIP (WITH OR WITHOUT PELVIS) 2-3V RIGHT COMPARISON:  None Available. FINDINGS: Right femoral intratrochanteric fracture is present, comminuted. There is superolateral displacement of the distal fracture fragment. There is no evidence for dislocation. The bones are osteopenic. Artifact overlies pelvis. IMPRESSION: Comminuted, displaced right femoral intratrochanteric fracture. Electronically Signed   By: Darliss Cheney M.D.   On:  02/04/2023 17:28   CT Head Wo Contrast  Result Date: 02/04/2023 CLINICAL DATA:  Head trauma, minor (Age >= 65y) EXAM: CT HEAD WITHOUT CONTRAST TECHNIQUE: Contiguous axial images were obtained from the base of the skull through the vertex without intravenous contrast. RADIATION DOSE REDUCTION: This exam was performed according to the departmental dose-optimization program which includes automated exposure control, adjustment of the mA and/or kV according to patient size and/or use of iterative reconstruction technique. COMPARISON:  CT head 12/10/21 FINDINGS: Brain: Hemorrhage. No hydrocephalus. No extra-axial fluid collection. No CT evidence of an acute cortical infarct. No mass effect. No mass lesion. Generalized volume loss without lobar predominance. There is sequela of mild chronic microvascular ischemic change. Vascular: No hyperdense vessel or unexpected calcification. Skull: Soft tissue swelling over the left frontal scalp. No evidence of underlying calvarial fracture. Sinuses/Orbits: No middle ear or mastoid effusion. Paranasal sinuses are clear. Bilateral lens replacement. Orbits are otherwise unremarkable. Other: None. IMPRESSION: Soft tissue swelling over the left frontal scalp. No evidence of underlying calvarial fracture or acute intracranial abnormality. Electronically Signed   By: Lorenza Cambridge M.D.   On: 02/04/2023 16:50    EKG: Pending  Assessment/Plan Principal Problem:   Closed right hip fracture [facility]) Active Problems:   Alcohol abuse   Fall at home, initial encounter   Failure to thrive in adult   COPD (chronic obstructive pulmonary disease) (HCC)   Hypertension   BPH (benign prostatic hyperplasia)   Assessment and Plan: * Closed right hip fracture (HCC) S/p Fall.  Pelvic x-ray - comminuted, displaced right femoral intratrochanteric fracture.  - EDP talked to Dr. Charlann Boxer, will see in consult, admit [facility], possible surgery 10/14 at 10/15 -N.P.O. midnight -IV Dilaudid  0.5 mg every 4 hourly -Chest X-ray clear, obtain Pre-op EKG  Fall at home, initial encounter Reports orthostatic dizziness with resultant fall and subsequent hip fracture.  Head CT-soft tissue swelling left frontal scalp. Fall in the setting of chronic alcohol abuse.  Patient reports poor oral intake over the past 2 to 3 weeks.  Tachycardic heart rate 94-104 dizziness is not new. - N/s + 20kcl 75cc/hr x 12hrs   Alcohol abuse Drinks half of 1/5 of vodka daily.  Last alcoholic beverage was this morning at about 10 AM.  Blood alcohol level unremarkable.  Prior hospitalization for alcohol withdrawal,- 11/2021 patient did well with CIWA protocol. -CIWA as needed -Thiamine, folate, multivitamins -Check mag, Phos   Failure to thrive in adult BMI 16. -Nuttrition consult  COPD (chronic obstructive pulmonary disease) (HCC) History of COPD and pneumoconiosis.  Chest x-ray clear. Not On home O2.  Hypertension Stable. -Resume Norvasc  Hypokalemia Mild. Potassium 3.3. - Check Mag, Phos   DVT prophylaxis: Heparin x 1, SCDS pending Surgery Code Status: FULL code-confirmed with patient at bedside Family Communication: None at bedside Disposition Plan: > 2 days Consults called: Ortho Admission status: Inpt Tele I certify that at the point of admission it is my clinical judgment that the patient will require inpatient hospital care spanning beyond 2 midnights from the point of admission due to high intensity of service, high risk for further deterioration and high frequency of surveillance required.    Author: Onnie Boer, MD 02/04/2023 10:22 PM  For on call review [email or web address].

## 2023-02-04 NOTE — Assessment & Plan Note (Addendum)
Stable. -Resume Norvasc 02-06-2023 BP stable 02-13-2023 stable HTN on norvasc.

## 2023-02-04 NOTE — Assessment & Plan Note (Signed)
History of COPD and pneumoconiosis.  Chest x-ray clear. Not On home O2.

## 2023-02-04 NOTE — Assessment & Plan Note (Signed)
Chronic. BMI 16. -Nuttrition consult says he has severe protein calorie malnutrition.

## 2023-02-04 NOTE — Assessment & Plan Note (Addendum)
02-04-2023 S/p Fall.  Pelvic x-ray - comminuted, displaced right femoral intratrochanteric fracture.  EDP talked to Dr. Charlann Boxer, will see in consult, admit to [facility], possible surgery 10/1.  N.P.O. midnight -IV Dilaudid 0.5 mg every 4 hourly prn -Chest X-ray clear, obtain Pre-op EKG  02-05-2023 had IM nailing of right femur  02-06-2023 will need SNF placement. 02-13-2023 awaiting SNF insurance authorization.  02-13-2023 received information from [facility] that insurance authorization has been approved. ASA 81 mg bid for 6 weeks for DVT prophylaxis

## 2023-02-04 NOTE — Assessment & Plan Note (Signed)
Mild. Potassium 3.3. - Check Mag, Phos

## 2023-02-04 NOTE — ED Provider Notes (Signed)
[place] EMERGENCY DEPARTMENT AT [facility] Provider Note   CSN: [id number] Arrival date & time: 02/04/23  1336     History  Chief Complaint  Patient presents with   Larry [name] is a 72 y.o. male with a history including COPD, hypertension, thyroid disease and alcohol abuse, he is states he drinks daily and drank a small amount of alcohol this morning, endorses significant DTs with alcohol sensation presenting for evaluation of injury sustained in a fall.  He was at an [employer] when he fell backwards and landed on his right side, he is fairly confident that he has fractured his right hip, he was unable to stand after the fall.  He also hit his right temporal area and sustained a moderate hematoma at the site.  He denies LOC.  He also endorses pain at his right elbow and shoulder.  He has had no treatment prior to arrival.  He denies numbness in his lower legs.   The history is provided by the patient.       Home Medications Prior to Admission medications   Medication Sig Start Date End Date Taking? Authorizing Provider  amLODipine (NORVASC) 10 MG tablet Take 1 tablet (10 mg total) by mouth daily. Patient not taking: Reported on 03/20/2022 12/13/21 01/12/22  Maurilio Lovely D, DO  chlordiazePOXIDE (LIBRIUM) 5 MG capsule Take 1 capsule (5 mg total) by mouth 3 (three) times daily as needed for anxiety or withdrawal. Patient not taking: Reported on 03/20/2022 12/12/21   Maurilio Lovely D, DO  cholecalciferol (VITAMIN D3) 25 MCG (1000 UNIT) tablet Take 1,000 Units by mouth daily.    [provider]  feeding supplement (ENSURE ENLIVE / ENSURE PLUS) LIQD Take 237 mLs by mouth 2 (two) times daily between meals. 12/12/21   Sherryll Burger, Pratik D, DO  lisinopril (ZESTRIL) 10 MG tablet Take 10 mg by mouth 2 (two) times daily. 01/18/22   [provider]  metoprolol tartrate (LOPRESSOR) 25 MG tablet Take 1 tablet (25 mg total) by mouth 2 (two) times daily. Patient not taking:  Reported on 03/20/2022 12/12/21 01/11/22  Maurilio Lovely D, DO  Multiple Vitamin (MULTIVITAMIN WITH MINERALS) TABS tablet Take 1 tablet by mouth daily. 03/16/21   Shon Hale, MD  naproxen (NAPROSYN) 500 MG tablet Take 1 tablet (500 mg total) by mouth 2 (two) times daily. Take with food 03/20/22   Triplett, Tammy, PA-C  oxyCODONE (OXY IR/ROXICODONE) 5 MG immediate release tablet Take 1 tablet (5 mg total) by mouth every 6 (six) hours as needed for severe pain. Patient not taking: Reported on 03/20/2022 12/12/21   Maurilio Lovely D, DO  pantoprazole (PROTONIX) 40 MG tablet Take 1 tablet (40 mg total) by mouth daily. Patient not taking: Reported on 12/11/2021 11/01/21 11/01/22  Vassie Loll, MD  tamsulosin (FLOMAX) 0.4 MG CAPS capsule Take 1 capsule (0.4 mg total) by mouth daily after supper. Patient not taking: Reported on 12/11/2021 11/02/21   Vassie Loll, MD      Allergies    Patient has no known allergies.    Review of Systems   Review of Systems  Constitutional:  Negative for chills and fever.  HENT:  Negative for congestion.   Eyes: Negative.   Respiratory:  Negative for chest tightness and shortness of breath.   Cardiovascular:  Negative for chest pain.  Gastrointestinal:  Negative for abdominal pain, nausea and vomiting.  Genitourinary: Negative.   Musculoskeletal:  Positive for arthralgias. Negative for joint swelling  and neck pain.  Skin: Negative.  Negative for rash and wound.  Neurological:  Positive for headaches. Negative for dizziness, weakness, light-headedness and numbness.  Psychiatric/Behavioral:  The patient is nervous/anxious.     Physical Exam Updated Vital Signs BP (!) 140/79 (BP Location: Right Arm)   Pulse 97   Temp 98.5 F (36.9 C)   Resp 17   Wt 56.7 kg   SpO2 97%   BMI 16.05 kg/m  Physical Exam Vitals and nursing note reviewed.  Constitutional:      Appearance: He is well-developed. He is cachectic.  HENT:     Head: Normocephalic and atraumatic.    Eyes:     Conjunctiva/sclera: Conjunctivae normal.  Cardiovascular:     Rate and Rhythm: Normal rate and regular rhythm.     Pulses:          Dorsalis pedis pulses are 2+ on the right side and 2+ on the left side.     Heart sounds: Normal heart sounds.     Comments: Pulses equal bilaterally Pulmonary:     Effort: Pulmonary effort is normal.     Breath sounds: Normal breath sounds. No wheezing.  Abdominal:     General: Bowel sounds are normal.     Palpations: Abdomen is soft.     Tenderness: There is no abdominal tenderness.  Musculoskeletal:        General: Tenderness present. Normal range of motion.     Right elbow: No deformity. Tenderness present.     Cervical back: Normal range of motion. Muscular tenderness present. No spinous process tenderness.     Right hip: Deformity and bony tenderness present.     Comments: Right lower extremity is shortened and held in external rotation.  Skin:    General: Skin is warm and dry.  Neurological:     Mental Status: He is alert.     Sensory: No sensory deficit.     Motor: No weakness.     Deep Tendon Reflexes: Reflexes normal.     ED Results / Procedures / Treatments   Labs (all labs ordered are listed, but only abnormal results are displayed) Labs Reviewed  CBC WITH DIFFERENTIAL/PLATELET - Abnormal; Notable for the following components:      Result Value   WBC 3.7 (*)    RBC 3.36 (*)    Hemoglobin 11.8 (*)    HCT 33.8 (*)    MCV 100.6 (*)    MCH 35.1 (*)    All other components within normal limits  COMPREHENSIVE METABOLIC PANEL - Abnormal; Notable for the following components:   Sodium 133 (*)    Potassium 3.3 (*)    Chloride 95 (*)    Glucose, Bld 107 (*)    Calcium 8.7 (*)    Total Protein 6.3 (*)    AST 78 (*)    Total Bilirubin 1.3 (*)    All other components within normal limits  URINALYSIS, ROUTINE W REFLEX MICROSCOPIC - Abnormal; Notable for the following components:   APPearance CLOUDY (*)    pH 9.0 (*)     Ketones, ur 20 (*)    Protein, ur 30 (*)    Leukocytes,Ua TRACE (*)    Bacteria, UA RARE (*)    All other components within normal limits  ETHANOL  PROTIME-INR  APTT    EKG None  Radiology DG Chest Portable 1 View  Result Date: 02/04/2023 CLINICAL DATA:  Fall.  Right hip fracture. EXAM: PORTABLE CHEST 1 VIEW COMPARISON:  Chest x-ray dated March 20, 2022. FINDINGS: The heart size and mediastinal contours are within normal limits. Normal pulmonary vascularity. Lungs remain hyperinflated with emphysematous changes. Scattered sequelae of prior granulomatous disease in the mediastinum, hila, and both lungs, unchanged. No focal consolidation, pleural effusion, or pneumothorax. No acute osseous abnormality. Chronically dislocated right shoulder. Old bilateral rib and left distal clavicle fractures. Multiple chronic thoracic compression deformities. IMPRESSION: 1. No active disease. 2. COPD.  Old granulomatous disease. Electronically Signed   By: Obie Dredge M.D.   On: 02/04/2023 17:32   DG Shoulder Right  Result Date: 02/04/2023 CLINICAL DATA:  Fall EXAM: RIGHT SHOULDER - 2+ VIEW COMPARISON:  Chest x-ray 03/20/2022 FINDINGS: There is inferior dislocation of the humeral head. This appears similar to chest x-ray 03/20/2022. There is no acute fracture. There is proximal humeral sideplate and screws. Exostosis along the medial aspect of the humeral head appears unchanged from the prior examination. The bones are diffusely osteopenic. Granulomatous disease noted in the lungs, unchanged. IMPRESSION: 1. Inferior dislocation of the right humeral head, possibly chronic. No acute fractures identified. 2. Osteopenia. Electronically Signed   By: Darliss Cheney M.D.   On: 02/04/2023 17:30   DG Hip Unilat W or Wo Pelvis 2-3 Views Right  Result Date: 02/04/2023 CLINICAL DATA:  Fall. EXAM: DG HIP (WITH OR WITHOUT PELVIS) 2-3V RIGHT COMPARISON:  None Available. FINDINGS: Right femoral intratrochanteric  fracture is present, comminuted. There is superolateral displacement of the distal fracture fragment. There is no evidence for dislocation. The bones are osteopenic. Artifact overlies pelvis. IMPRESSION: Comminuted, displaced right femoral intratrochanteric fracture. Electronically Signed   By: Darliss Cheney M.D.   On: 02/04/2023 17:28   CT Head Wo Contrast  Result Date: 02/04/2023 CLINICAL DATA:  Head trauma, minor (Age >= 65y) EXAM: CT HEAD WITHOUT CONTRAST TECHNIQUE: Contiguous axial images were obtained from the base of the skull through the vertex without intravenous contrast. RADIATION DOSE REDUCTION: This exam was performed according to the departmental dose-optimization program which includes automated exposure control, adjustment of the mA and/or kV according to patient size and/or use of iterative reconstruction technique. COMPARISON:  CT head 12/10/21 FINDINGS: Brain: Hemorrhage. No hydrocephalus. No extra-axial fluid collection. No CT evidence of an acute cortical infarct. No mass effect. No mass lesion. Generalized volume loss without lobar predominance. There is sequela of mild chronic microvascular ischemic change. Vascular: No hyperdense vessel or unexpected calcification. Skull: Soft tissue swelling over the left frontal scalp. No evidence of underlying calvarial fracture. Sinuses/Orbits: No middle ear or mastoid effusion. Paranasal sinuses are clear. Bilateral lens replacement. Orbits are otherwise unremarkable. Other: None. IMPRESSION: Soft tissue swelling over the left frontal scalp. No evidence of underlying calvarial fracture or acute intracranial abnormality. Electronically Signed   By: Lorenza Cambridge M.D.   On: 02/04/2023 16:50    Procedures Procedures    Medications Ordered in ED Medications  LORazepam (ATIVAN) tablet 1 mg ( Oral See Alternative 02/04/23 1509)    Or  LORazepam (ATIVAN) tablet 0-4 mg ( Oral Not Given 02/04/23 1509)  thiamine (VITAMIN B1) tablet 100 mg ( Oral See  Alternative 02/04/23 1513)    Or  thiamine (VITAMIN B1) injection 100 mg (100 mg Intravenous Given 02/04/23 1513)  HYDROmorphone (DILAUDID) injection 0.5 mg (has no administration in time range)  potassium chloride SA (KLOR-CON M) CR tablet 20 mEq (has no administration in time range)  Tdap (BOOSTRIX) injection 0.5 mL (0.5 mLs Intramuscular Given 02/04/23 1517)  morphine (PF) 4 MG/ML injection  4 mg (4 mg Intravenous Given 02/04/23 1513)  ondansetron (ZOFRAN) injection 4 mg (4 mg Intravenous Given 02/04/23 1513)    ED Course/ Medical Decision Making/ A&P                                 Medical Decision Making Patient presenting with a trip and fall landing on his right hip, also hit his head, CT imaging plain film imaging all reviewed and he has sustained a right intertrochanteric hip fracture, he has no internal head injury or other orthopedic injuries.  Review of his right shoulder although it appears to be inferiorly dislocated, and on exam patient does have the appearance of a anteriorly dislocated joint, however he states this is a chronic condition from a prior injury and surgical reconstruction.  He states he has no pain or abnormalities sustained from today's fall regarding the right shoulder.  Amount and/or Complexity of Data Reviewed Labs: ordered.    Details: Labs obtained including a urinalysis, given his alcohol history coags were completed and are normal, c-Met shows a mild hypokalemia and a mild hyponatremia.  He has been given gentle IV fluids, he has been given oral p.o. supplementation of his potassium.  CBC reviewed, he has a mild leukopenia with the WBC at 3.7, he has a hemoglobin of 11.8. Radiology: ordered.    Details: Radiology images reviewed, no acute findings except for right hip as mentioned above. Discussion of management or test interpretation with external provider(s): Patient was discussed with Dr. Charlann Boxer of orthopedics, he is requesting a hospitalist  admission/transfer to [facility], he will plan surgery either tomorrow or Tuesday.  Call placed to the hospitalist, discussed with Dr. Mariea Clonts who accepts patient for admission.  Risk OTC drugs. Prescription drug management. Decision regarding hospitalization.           Final Clinical Impression(s) / ED Diagnoses Final diagnoses:  Closed fracture of right hip, initial encounter [facility])  Injury of head, initial encounter    Rx / DC Orders ED Discharge Orders     None         Victoriano Lain 02/04/23 2005    Glendora Score, MD 02/05/23 1136

## 2023-02-05 ENCOUNTER — Inpatient Hospital Stay (HOSPITAL_COMMUNITY): Payer: Medicare HMO

## 2023-02-05 ENCOUNTER — Encounter (HOSPITAL_COMMUNITY): Payer: Self-pay | Admitting: Internal Medicine

## 2023-02-05 ENCOUNTER — Other Ambulatory Visit: Payer: Self-pay

## 2023-02-05 ENCOUNTER — Inpatient Hospital Stay (HOSPITAL_COMMUNITY): Payer: Medicare HMO | Admitting: Anesthesiology

## 2023-02-05 ENCOUNTER — Encounter (HOSPITAL_COMMUNITY)
Admission: EM | Disposition: A | Discharge status: Skilled Nursing Facility | Payer: Self-pay | Source: Home / Self Care | Attending: Internal Medicine

## 2023-02-05 DIAGNOSIS — N400 Enlarged prostate without lower urinary tract symptoms: Secondary | ICD-10-CM | POA: Diagnosis not present

## 2023-02-05 DIAGNOSIS — J449 Chronic obstructive pulmonary disease, unspecified: Secondary | ICD-10-CM

## 2023-02-05 DIAGNOSIS — E876 Hypokalemia: Secondary | ICD-10-CM | POA: Diagnosis not present

## 2023-02-05 DIAGNOSIS — S72001A Fracture of unspecified part of neck of right femur, initial encounter for closed fracture: Secondary | ICD-10-CM | POA: Diagnosis not present

## 2023-02-05 DIAGNOSIS — S72141A Displaced intertrochanteric fracture of right femur, initial encounter for closed fracture: Secondary | ICD-10-CM | POA: Diagnosis not present

## 2023-02-05 DIAGNOSIS — I10 Essential (primary) hypertension: Secondary | ICD-10-CM

## 2023-02-05 DIAGNOSIS — W19XXXA Unspecified fall, initial encounter: Secondary | ICD-10-CM | POA: Diagnosis not present

## 2023-02-05 HISTORY — PX: INTRAMEDULLARY (IM) NAIL INTERTROCHANTERIC: SHX5875

## 2023-02-05 LAB — HEMOGLOBIN AND HEMATOCRIT, BLOOD
HCT: 24 % — ABNORMAL LOW (ref 39.0–52.0)
HCT: 21.3 % — ABNORMAL LOW (ref 39.0–52.0)
Hemoglobin: 8 g/dL — ABNORMAL LOW (ref 13.0–17.0)
Hemoglobin: 7.2 g/dL — ABNORMAL LOW (ref 13.0–17.0)

## 2023-02-05 LAB — CBC
HCT: 26.7 % — ABNORMAL LOW (ref 39.0–52.0)
Hemoglobin: 8.9 g/dL — ABNORMAL LOW (ref 13.0–17.0)
MCH: 34.9 pg — ABNORMAL HIGH (ref 26.0–34.0)
MCHC: 33.3 g/dL (ref 30.0–36.0)
MCV: 104.7 fL — ABNORMAL HIGH (ref 80.0–100.0)
Platelets: 144 10*3/uL — ABNORMAL LOW (ref 150–400)
RBC: 2.55 MIL/uL — ABNORMAL LOW (ref 4.22–5.81)
RDW: 14.8 % (ref 11.5–15.5)
WBC: 5.6 10*3/uL (ref 4.0–10.5)
nRBC: 0 % (ref 0.0–0.2)

## 2023-02-05 LAB — COMPREHENSIVE METABOLIC PANEL
ALT: 23 U/L (ref 0–44)
AST: 34 U/L (ref 15–41)
Albumin: 3 g/dL — ABNORMAL LOW (ref 3.5–5.0)
Alkaline Phosphatase: 67 U/L (ref 38–126)
Anion gap: 9 (ref 5–15)
BUN: 11 mg/dL (ref 8–23)
CO2: 25 mmol/L (ref 22–32)
Calcium: 8.4 mg/dL — ABNORMAL LOW (ref 8.9–10.3)
Chloride: 102 mmol/L (ref 98–111)
Creatinine, Ser: 0.7 mg/dL (ref 0.61–1.24)
GFR, Estimated: >60 mL/min (ref >60)
Glucose, Bld: 89 mg/dL (ref 70–99)
Potassium: 4.4 mmol/L (ref 3.5–5.1)
Sodium: 136 mmol/L (ref 135–145)
Total Bilirubin: 1.1 mg/dL (ref 0.3–1.2)
Total Protein: 5.2 g/dL — ABNORMAL LOW (ref 6.5–8.1)

## 2023-02-05 LAB — SURGICAL PCR SCREEN
MRSA, PCR: NEGATIVE
Staphylococcus aureus: NEGATIVE

## 2023-02-05 LAB — PHOSPHORUS: Phosphorus: 3.9 mg/dL (ref 2.5–4.6)

## 2023-02-05 LAB — MAGNESIUM: Magnesium: 1.6 mg/dL — ABNORMAL LOW (ref 1.7–2.4)

## 2023-02-05 SURGERY — FIXATION, FRACTURE, INTERTROCHANTERIC, WITH INTRAMEDULLARY ROD
Anesthesia: General | Site: Hip | Laterality: Right

## 2023-02-05 MED ORDER — PROPOFOL 10 MG/ML IV BOLUS
INTRAVENOUS | Status: AC
  Filled 2023-02-05: qty 20

## 2023-02-05 MED ORDER — ROCURONIUM BROMIDE 10 MG/ML (PF) SYRINGE
PREFILLED_SYRINGE | INTRAVENOUS | Status: AC
  Filled 2023-02-05: qty 10

## 2023-02-05 MED ORDER — LACTATED RINGERS IV SOLN
INTRAVENOUS | Status: DC | PRN
Start: 2023-02-05 — End: 2023-02-05

## 2023-02-05 MED ORDER — LIDOCAINE 2% (20 MG/ML) 5 ML SYRINGE
INTRAMUSCULAR | Status: AC
  Filled 2023-02-05: qty 5

## 2023-02-05 MED ORDER — LIDOCAINE 2% (20 MG/ML) 5 ML SYRINGE
INTRAMUSCULAR | Status: DC | PRN
  Administered 2023-02-05: 60 mg via INTRAVENOUS

## 2023-02-05 MED ORDER — CHLORHEXIDINE GLUCONATE 4 % EX SOLN
60.0000 mL | Freq: Once | CUTANEOUS | Status: DC
  Administered 2023-02-05: 4 via TOPICAL

## 2023-02-05 MED ORDER — CEFAZOLIN SODIUM-DEXTROSE 2-4 GM/100ML-% IV SOLN
2.0000 g | Freq: Three times a day (TID) | INTRAVENOUS | Status: AC
  Administered 2023-02-05 – 2023-02-06 (×3): 2 g via INTRAVENOUS
  Filled 2023-02-05 (×3): qty 100

## 2023-02-05 MED ORDER — MAGNESIUM SULFATE 2 GM/50ML IV SOLN
2.0000 g | Freq: Once | INTRAVENOUS | Status: AC
  Administered 2023-02-05: 2 g via INTRAVENOUS
  Filled 2023-02-05: qty 50

## 2023-02-05 MED ORDER — ONDANSETRON HCL 4 MG/2ML IJ SOLN
INTRAMUSCULAR | Status: DC | PRN
  Administered 2023-02-05: 4 mg via INTRAVENOUS

## 2023-02-05 MED ORDER — CEFAZOLIN SODIUM-DEXTROSE 2-4 GM/100ML-% IV SOLN
2.0000 g | INTRAVENOUS | Status: AC
  Administered 2023-02-05: 2 g via INTRAVENOUS
  Filled 2023-02-05: qty 100

## 2023-02-05 MED ORDER — VANCOMYCIN HCL 1000 MG IV SOLR
INTRAVENOUS | Status: AC
  Filled 2023-02-05: qty 20

## 2023-02-05 MED ORDER — HYALURONIDASE HUMAN 150 UNIT/ML IJ SOLN
150.0000 [IU] | Freq: Once | INTRAMUSCULAR | Status: AC
  Administered 2023-02-05: 150 [IU] via SUBCUTANEOUS
  Filled 2023-02-05: qty 1

## 2023-02-05 MED ORDER — MIDAZOLAM HCL 2 MG/2ML IJ SOLN
INTRAMUSCULAR | Status: DC | PRN
  Administered 2023-02-05: 1 mg via INTRAVENOUS

## 2023-02-05 MED ORDER — AMISULPRIDE (ANTIEMETIC) 5 MG/2ML IV SOLN: 10.0000 mg | Freq: Once | INTRAVENOUS | Status: DC | PRN

## 2023-02-05 MED ORDER — FENTANYL CITRATE (PF) 250 MCG/5ML IJ SOLN
INTRAMUSCULAR | Status: AC
  Filled 2023-02-05: qty 5

## 2023-02-05 MED ORDER — ONDANSETRON HCL 4 MG/2ML IJ SOLN
INTRAMUSCULAR | Status: AC
  Filled 2023-02-05: qty 2

## 2023-02-05 MED ORDER — ROCURONIUM BROMIDE 10 MG/ML (PF) SYRINGE
PREFILLED_SYRINGE | INTRAVENOUS | Status: DC | PRN
  Administered 2023-02-05: 40 mg via INTRAVENOUS

## 2023-02-05 MED ORDER — SUGAMMADEX SODIUM 200 MG/2ML IV SOLN
INTRAVENOUS | Status: DC | PRN
  Administered 2023-02-05: 200 mg via INTRAVENOUS

## 2023-02-05 MED ORDER — FENTANYL CITRATE (PF) 250 MCG/5ML IJ SOLN
INTRAMUSCULAR | Status: DC | PRN
  Administered 2023-02-05: 100 ug via INTRAVENOUS
  Administered 2023-02-05: 50 ug via INTRAVENOUS

## 2023-02-05 MED ORDER — POVIDONE-IODINE 10 % EX SWAB
2.0000 | Freq: Once | CUTANEOUS | Status: AC
  Administered 2023-02-05: 2 via TOPICAL

## 2023-02-05 MED ORDER — FENTANYL CITRATE (PF) 100 MCG/2ML IJ SOLN
INTRAMUSCULAR | Status: AC
  Filled 2023-02-05: qty 2

## 2023-02-05 MED ORDER — PROPOFOL 10 MG/ML IV BOLUS
INTRAVENOUS | Status: DC | PRN
  Administered 2023-02-05: 80 mg via INTRAVENOUS

## 2023-02-05 MED ORDER — CHLORHEXIDINE GLUCONATE 0.12 % MT SOLN
15.0000 mL | Freq: Once | OROMUCOSAL | Status: AC
  Administered 2023-02-05: 15 mL via OROMUCOSAL
  Filled 2023-02-05 (×2): qty 15

## 2023-02-05 MED ORDER — MIDAZOLAM HCL 2 MG/2ML IJ SOLN
INTRAMUSCULAR | Status: AC
  Filled 2023-02-05: qty 2

## 2023-02-05 MED ORDER — FENTANYL CITRATE (PF) 100 MCG/2ML IJ SOLN
25.0000 ug | INTRAMUSCULAR | Status: DC | PRN
  Administered 2023-02-05: 25 ug via INTRAVENOUS
  Administered 2023-02-05: 50 ug via INTRAVENOUS

## 2023-02-05 MED ORDER — 0.9 % SODIUM CHLORIDE (POUR BTL) OPTIME
TOPICAL | Status: DC | PRN
  Administered 2023-02-05: 1000 mL

## 2023-02-05 MED ORDER — ORAL CARE MOUTH RINSE: 15.0000 mL | Freq: Once | OROMUCOSAL | Status: AC

## 2023-02-05 MED ORDER — ENOXAPARIN SODIUM 40 MG/0.4ML IJ SOSY
40.0000 mg | PREFILLED_SYRINGE | INTRAMUSCULAR | Status: DC
  Administered 2023-02-06 – 2023-02-13 (×7): 40 mg via SUBCUTANEOUS
  Filled 2023-02-05 (×8): qty 0.4

## 2023-02-05 SURGICAL SUPPLY — 48 items
ADH SKN CLS APL DERMABOND .7 (GAUZE/BANDAGES/DRESSINGS) ×1
APL PRP STRL LF DISP 70% ISPRP (MISCELLANEOUS) ×1
BAG COUNTER SPONGE SURGICOUNT (BAG) IMPLANT
BAG SPNG CNTER NS LX DISP (BAG)
BIT DRILL INTERTAN LAG SCREW (BIT) IMPLANT
BIT DRILL LONG 4.0 (BIT) IMPLANT
BRUSH SCRUB EZ PLAIN DRY (MISCELLANEOUS) ×2 IMPLANT
CHLORAPREP W/TINT 26 (MISCELLANEOUS) ×1 IMPLANT
COVER PERINEAL POST (MISCELLANEOUS) ×1 IMPLANT
COVER SURGICAL LIGHT HANDLE (MISCELLANEOUS) ×1 IMPLANT
DERMABOND ADVANCED .7 DNX12 (GAUZE/BANDAGES/DRESSINGS) ×1 IMPLANT
DRAPE C-ARM 35X43 STRL (DRAPES) ×1 IMPLANT
DRAPE IMP U-DRAPE 54X76 (DRAPES) ×2 IMPLANT
DRAPE INCISE IOBAN 66X45 STRL (DRAPES) ×1 IMPLANT
DRAPE STERI IOBAN 125X83 (DRAPES) ×1 IMPLANT
DRAPE SURG 17X23 STRL (DRAPES) ×2 IMPLANT
DRAPE U-SHAPE 47X51 STRL (DRAPES) ×1 IMPLANT
DRESSING MEPILEX FLEX 4X4 (GAUZE/BANDAGES/DRESSINGS) ×1 IMPLANT
DRILL BIT LONG 4.0 (BIT) ×1
DRSG MEPILEX FLEX 4X4 (GAUZE/BANDAGES/DRESSINGS) ×1
DRSG MEPILEX POST OP 4X8 (GAUZE/BANDAGES/DRESSINGS) ×1 IMPLANT
ELECT REM PT RETURN 9FT ADLT (ELECTROSURGICAL) ×1
ELECTRODE REM PT RTRN 9FT ADLT (ELECTROSURGICAL) ×1 IMPLANT
GLOVE BIO SURGEON STRL SZ 6.5 (GLOVE) ×3 IMPLANT
GLOVE BIO SURGEON STRL SZ7.5 (GLOVE) ×4 IMPLANT
GLOVE BIOGEL PI IND STRL 6.5 (GLOVE) ×1 IMPLANT
GLOVE BIOGEL PI IND STRL 7.5 (GLOVE) ×1 IMPLANT
GOWN STRL REUS W/ TWL LRG LVL3 (GOWN DISPOSABLE) ×1 IMPLANT
GOWN STRL REUS W/TWL LRG LVL3 (GOWN DISPOSABLE) ×1
GUIDE PIN 3.2X343 (PIN) ×2
GUIDE PIN 3.2X343MM (PIN) ×2
KIT BASIN OR (CUSTOM PROCEDURE TRAY) ×1 IMPLANT
KIT TURNOVER KIT B (KITS) ×1 IMPLANT
MANIFOLD NEPTUNE II (INSTRUMENTS) ×1 IMPLANT
NAIL INTERTAN 10X18 130D 10S (Nail) IMPLANT
NS IRRIG 1000ML POUR BTL (IV SOLUTION) ×1 IMPLANT
PACK GENERAL/GYN (CUSTOM PROCEDURE TRAY) ×1 IMPLANT
PAD ARMBOARD 7.5X6 YLW CONV (MISCELLANEOUS) ×2 IMPLANT
PIN GUIDE 3.2X343MM (PIN) IMPLANT
SCREW LAG COMPR KIT 105/100 (Screw) IMPLANT
SCREW TRIGEN LOW PROF 5.0X40 (Screw) IMPLANT
SUT MNCRL AB 3-0 PS2 18 (SUTURE) ×1 IMPLANT
SUT VIC AB 0 CT1 27 (SUTURE)
SUT VIC AB 0 CT1 27XBRD ANBCTR (SUTURE) IMPLANT
SUT VIC AB 2-0 CT1 27 (SUTURE) ×2
SUT VIC AB 2-0 CT1 TAPERPNT 27 (SUTURE) ×2 IMPLANT
TOWEL GREEN STERILE (TOWEL DISPOSABLE) ×2 IMPLANT
WATER STERILE IRR 1000ML POUR (IV SOLUTION) ×1 IMPLANT

## 2023-02-05 NOTE — Progress Notes (Signed)
PROGRESS NOTE  Larry [name]:[id number] DOB: 07/08/1950   PCP: Benita Stabile, MD  Patient is from: Home.  DOA: 02/04/2023 LOS: 1  Chief complaints Chief Complaint  Patient presents with   Fall     Brief Narrative / Interim history: 72 year old M with PMH of alcohol abuse, COPD, HTN, pneumoconiosis, BPH and tobacco use disorder brought to [facility], ED after fall.  Reportedly, he fell on the street when he got out of the truck to use ATM at the bank across from the street.  He has had some dizziness and poor p.o. intake for 2 to 3 weeks prior to this.  He admits to drinking 1/5 of vodka daily.  Last drink on the day of presentation.  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  In ED, stable vitals.  CT head showed soft tissue swelling of left frontal scalp.  Pelvic x-ray showed comminuted right femoral intertrochanteric fracture.  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  Orthopedic surgery consulted and recommended transfer to [facility] for surgical intervention.  Subjective: Seen and examined earlier this morning before he went for surgery.  He is sleepy but wakes to voice.  No major events overnight of this morning.  Reports pain in his right hip.  He denies chest pain, shortness of breath or dizziness.  Denies GI or UTI symptoms.  Objective: Vitals:   02/05/23 0804 02/05/23 1307 02/05/23 1321 02/05/23 1326  BP: 115/69 127/87    Pulse: 98 99    Resp: 18 18    Temp: 97.6 F (36.4 C) 98.1 F (36.7 C)    TempSrc:  Oral    SpO2: 95% 94% (!) 89% 93%  Weight:  56 kg    Height:  6\' 2"  (1.88 m)      Examination:  GENERAL: Appears frail.  No apparent distress. HEENT: MMM.  Vision and hearing grossly intact.  NECK: Supple.  No apparent JVD.  RESP:  No IWOB.  Fair aeration bilaterally. CVS:  RRR. Heart sounds normal.  ABD/GI/GU: BS+. Abd soft, NTND.  MSK/EXT: Moves left leg.  Does not move right leg due to fracture.   Significant muscle mass and subcu fat loss. SKIN: no apparent skin lesion or wound NEURO: Sleepy but wakes to voice.  Oriented x 4 except date.  No apparent focal neuro deficit. PSYCH: Calm. Normal affect.   Procedures:  None  Microbiology summarized: MRSA PCR screen nonreactive  Assessment and plan: Principal Problem:   Closed right hip fracture [facility] Active Problems:   Alcohol abuse   Fall at home, initial encounter   Failure to thrive in adult   COPD (chronic obstructive pulmonary disease) (HCC)   Hypertension   BPH (benign prostatic hyperplasia)   Hypokalemia   Closed right hip fracture due to fall: Fall likely due to alcohol.  Pelvic x-ray showed comminuted displaced right femoral intertrochanteric fracture.  Neurovascular intact. -Plan for surgical repair by orthopedic surgery -Pain control -PT/OT eval after surgery    Fall at home, initial encounter: He reports dizziness/orthostasis and poor p.o. intake for 2 to 3 weeks.  Suspect this is due to alcohol. -Encouraged alcohol cessation -Fall precaution   Alcohol abuse: Drinks half of 1/5 of vodka daily.  Last drink was the morning of admission.  EtOH level undetectable.   Prior hospitalization for alcohol withdrawal.  No withdrawal symptoms. -Continue CIWA with as needed Ativan -Thiamine, folate, multivitamins -Monitor electrolytes and replenish as appropriate  COPD (chronic obstructive pulmonary disease) (HCC): History of COPD and pneumoconiosis.  Chest x-ray clear. Not On home O2. -Nebulizers as needed -Encouraged smoking cessation  Macrocytic anemia: Unclear baseline.  Likely due to alcohol abuse.  No report of GI bleed.  Reportedly in hemoglobin likely dilutional. Recent Labs    03/20/22 1942 02/04/23 1455 02/05/23 0819 02/05/23 1205  HGB 13.9 11.8* 8.9* 8.0*  -Closely monitor H&H -Check anemia panel  Tobacco use disorder -Encourage smoking cessation -Nicotine patch  Hypertension: Stable -Continue  home Norvasc   Hypokalemia/hypomagnesemia -Monitor replenish as appropriate  Failure to thrive/severe malnutrition: Appears frail with significant muscle mass and subcu fat loss.  Likely due to chronic alcohol abuse. Body mass index is 15.85 kg/m. -Consult dietitian -Needs age-appropriate cancer screening such as colonoscopy           DVT prophylaxis:  SCDs Start: 02/04/23 2154  Code Status: Full code Family Communication: None at bedside Level of care: Telemetry Medical Status is: Inpatient Remains inpatient appropriate because: Femoral fracture   Final disposition: Likely SNF Consultants:  Orthopedic surgery  55 minutes with more than 50% spent in reviewing records, counseling patient/family and coordinating care.   Sch Meds:  Scheduled Meds:  [MAR Hold] amLODipine  5 mg Oral Daily   chlorhexidine  60 mL Topical Once   [MAR Hold] folic acid  1 mg Oral Daily   [MAR Hold] multivitamin with minerals  1 tablet Oral Daily   [MAR Hold] nicotine  7 mg Transdermal Daily   [MAR Hold] thiamine  100 mg Oral Daily   Or   [MAR Hold] thiamine  100 mg Intravenous Daily   Continuous Infusions:  [START ON 02/06/2023]  ceFAZolin (ANCEF) IV     [MAR Hold] magnesium sulfate bolus IVPB     [MAR Hold] methocarbamol (ROBAXIN) IV     PRN Meds:.[MAR Hold] HYDROcodone-acetaminophen, [MAR Hold]  HYDROmorphone (DILAUDID) injection, [MAR Hold] LORazepam **OR** [MAR Hold] LORazepam, [MAR Hold] methocarbamol **OR** [MAR Hold] methocarbamol (ROBAXIN) IV, [MAR Hold]  morphine injection, [MAR Hold] polyethylene glycol  Antimicrobials: Anti-infectives (From admission, onward)    Start     Dose/Rate Route Frequency Ordered Stop   02/06/23 0600  ceFAZolin (ANCEF) IVPB 2g/100 mL premix        2 g 200 mL/hr over 30 Minutes Intravenous On call to O.R. 02/05/23 1243 02/07/23 0559        I have personally reviewed the following labs and images: CBC: Recent Labs  Lab 02/04/23 1455  02/05/23 0819 02/05/23 1205  WBC 3.7* 5.6  --   NEUTROABS 2.1  --   --   HGB 11.8* 8.9* 8.0*  HCT 33.8* 26.7* 24.0*  MCV 100.6* 104.7*  --   PLT 159 144*  --    BMP &GFR Recent Labs  Lab 02/04/23 1455 02/04/23 2237 02/05/23 0819  NA 133*  --  136  K 3.3*  --  4.4  CL 95*  --  102  CO2 28  --  25  GLUCOSE 107*  --  89  BUN 10  --  11  CREATININE 0.64  --  0.70  CALCIUM 8.7*  --  8.4*  MG  --  1.6* 1.6*  PHOS  --  3.6 3.9   Estimated Creatinine Clearance: 66.1 mL/min (by C-G formula based on SCr of 0.7 mg/dL). Liver & Pancreas: Recent Labs  Lab 02/04/23 1455 02/05/23 0819  AST 78* 34  ALT 34 23  ALKPHOS 86 67  BILITOT 1.3* 1.1  PROT 6.3* 5.2*  ALBUMIN 3.8 3.0*   No results for input(s): "LIPASE", "AMYLASE" in the last 168 hours. No results for input(s): "AMMONIA" in the last 168 hours. Diabetic: No results for input(s): "HGBA1C" in the last 72 hours. No results for input(s): "GLUCAP" in the last 168 hours. Cardiac Enzymes: No results for input(s): "CKTOTAL", "CKMB", "CKMBINDEX", "TROPONINI" in the last 168 hours. No results for input(s): "PROBNP" in the last 8760 hours. Coagulation Profile: Recent Labs  Lab 02/04/23 1515  INR 1.0   Thyroid Function Tests: No results for input(s): "TSH", "T4TOTAL", "FREET4", "T3FREE", "THYROIDAB" in the last 72 hours. Lipid Profile: No results for input(s): "CHOL", "HDL", "LDLCALC", "TRIG", "CHOLHDL", "LDLDIRECT" in the last 72 hours. Anemia Panel: No results for input(s): "VITAMINB12", "FOLATE", "FERRITIN", "TIBC", "IRON", "RETICCTPCT" in the last 72 hours. Urine analysis:    Component Value Date/Time   COLORURINE YELLOW 02/04/2023 1714   APPEARANCEUR CLOUDY (A) 02/04/2023 1714   LABSPEC 1.013 02/04/2023 1714   PHURINE 9.0 (H) 02/04/2023 1714   GLUCOSEU NEGATIVE 02/04/2023 1714   HGBUR NEGATIVE 02/04/2023 1714   BILIRUBINUR NEGATIVE 02/04/2023 1714   KETONESUR 20 (A) 02/04/2023 1714   PROTEINUR 30 (A) 02/04/2023  1714   NITRITE NEGATIVE 02/04/2023 1714   LEUKOCYTESUR TRACE (A) 02/04/2023 1714   Sepsis Labs: Invalid input(s): "PROCALCITONIN", "LACTICIDVEN"  Microbiology: Recent Results (from the past 240 hour(s))  Surgical pcr screen     Status: None   Collection Time: 02/05/23 10:09 AM   Specimen: Nasal Mucosa; Nasal Swab  Result Value Ref Range Status   MRSA, PCR NEGATIVE NEGATIVE Final   Staphylococcus aureus NEGATIVE NEGATIVE Final    Comment: (NOTE) The Xpert SA Assay (FDA approved for NASAL specimens in patients 12 years of age and older), is one component of a comprehensive surveillance program. It is not intended to diagnose infection nor to guide or monitor treatment. Performed at [facility], [address] [place], [place] [address]     Radiology Studies: CT Head Wo Contrast  Addendum Date: 02/04/2023   ADDENDUM REPORT: 02/04/2023 18:09 ADDENDUM: This addendum addresses the findings section of the report. The brain subheading should state "No hemorrhage" Electronically Signed   By: Lorenza Cambridge M.D.   On: 02/04/2023 18:09   Result Date: 02/04/2023 CLINICAL DATA:  Head trauma, minor (Age >= 65y) EXAM: CT HEAD WITHOUT CONTRAST TECHNIQUE: Contiguous axial images were obtained from the base of the skull through the vertex without intravenous contrast. RADIATION DOSE REDUCTION: This exam was performed according to the departmental dose-optimization program which includes automated exposure control, adjustment of the mA and/or kV according to patient size and/or use of iterative reconstruction technique. COMPARISON:  CT head 12/10/21 FINDINGS: Brain: Hemorrhage. No hydrocephalus. No extra-axial fluid collection. No CT evidence of an acute cortical infarct. No mass effect. No mass lesion. Generalized volume loss without lobar predominance. There is sequela of mild chronic microvascular ischemic change. Vascular: No hyperdense vessel or unexpected calcification. Skull: Soft tissue  swelling over the left frontal scalp. No evidence of underlying calvarial fracture. Sinuses/Orbits: No middle ear or mastoid effusion. Paranasal sinuses are clear. Bilateral lens replacement. Orbits are otherwise unremarkable. Other: None. IMPRESSION: Soft tissue swelling over the left frontal scalp. No evidence of underlying calvarial fracture or acute intracranial abnormality. Electronically Signed: By: Lorenza Cambridge M.D. On: 02/04/2023 16:50   DG Elbow Complete Right  Result Date: 02/04/2023 CLINICAL DATA:  Status post fall. EXAM: RIGHT ELBOW - COMPLETE 3+ VIEW COMPARISON:  None  Available. FINDINGS: Diminished exam detail due to suboptimal lateral projection radiograph. Within this limitation, there is no sign of acute fracture or dislocation. Lack of true lateral radiograph limits assessment for joint effusion which may suggest an occult radial head fracture. Soft tissues appear normal. IMPRESSION: 1. No acute findings. 2. Diminished exam detail due to suboptimal lateral projection radiograph. If there is a high clinical concern for elbow fracture then repeat imaging with true lateral projection radiograph is recommended. Electronically Signed   By: Signa Kell M.D.   On: 02/04/2023 17:33   DG Chest Portable 1 View  Result Date: 02/04/2023 CLINICAL DATA:  Fall.  Right hip fracture. EXAM: PORTABLE CHEST 1 VIEW COMPARISON:  Chest x-ray dated March 20, 2022. FINDINGS: The heart size and mediastinal contours are within normal limits. Normal pulmonary vascularity. Lungs remain hyperinflated with emphysematous changes. Scattered sequelae of prior granulomatous disease in the mediastinum, hila, and both lungs, unchanged. No focal consolidation, pleural effusion, or pneumothorax. No acute osseous abnormality. Chronically dislocated right shoulder. Old bilateral rib and left distal clavicle fractures. Multiple chronic thoracic compression deformities. IMPRESSION: 1. No active disease. 2. COPD.  Old  granulomatous disease. Electronically Signed   By: Obie Dredge M.D.   On: 02/04/2023 17:32   DG Shoulder Right  Result Date: 02/04/2023 CLINICAL DATA:  Fall EXAM: RIGHT SHOULDER - 2+ VIEW COMPARISON:  Chest x-ray 03/20/2022 FINDINGS: There is inferior dislocation of the humeral head. This appears similar to chest x-ray 03/20/2022. There is no acute fracture. There is proximal humeral sideplate and screws. Exostosis along the medial aspect of the humeral head appears unchanged from the prior examination. The bones are diffusely osteopenic. Granulomatous disease noted in the lungs, unchanged. IMPRESSION: 1. Inferior dislocation of the right humeral head, possibly chronic. No acute fractures identified. 2. Osteopenia. Electronically Signed   By: Darliss Cheney M.D.   On: 02/04/2023 17:30   DG Hip Unilat W or Wo Pelvis 2-3 Views Right  Result Date: 02/04/2023 CLINICAL DATA:  Fall. EXAM: DG HIP (WITH OR WITHOUT PELVIS) 2-3V RIGHT COMPARISON:  None Available. FINDINGS: Right femoral intratrochanteric fracture is present, comminuted. There is superolateral displacement of the distal fracture fragment. There is no evidence for dislocation. The bones are osteopenic. Artifact overlies pelvis. IMPRESSION: Comminuted, displaced right femoral intratrochanteric fracture. Electronically Signed   By: Darliss Cheney M.D.   On: 02/04/2023 17:28      Suyash Amory T. Ionna Avis [facility]  If 7PM-7AM, please contact night-coverage [email or web address] 02/05/2023, 1:34 PM

## 2023-02-05 NOTE — Progress Notes (Signed)
Initial Nutrition Assessment  DOCUMENTATION CODES:   Underweight, Severe malnutrition in context of social or environmental circumstances  INTERVENTION:  Ensure TID   NUTRITION DIAGNOSIS:   Severe Malnutrition related to social / environmental circumstances as evidenced by meal completion < 25%, severe muscle depletion, severe fat depletion.    GOAL:   Patient will meet greater than or equal to 90% of their needs    MONITOR:   PO intake, Supplement acceptance, Labs, Weight trends  REASON FOR ASSESSMENT:   Consult Assessment of nutrition requirement/status, Hip fracture protocol  ASSESSMENT: 72 y.o. M admitted closed right hip fracture related to fall.  With reported dizziness, declined meal intake generalized not feeling well. Pmhx: Alcohol abuse, arthritis, COPD, HTN, shingles, SOB. Patient hard to understand stated UBW 125#,  Stated he fell while going to bank. Further stated that he has not had an appetite. Meal intake had started to decline around a month ago. Discussed Ensure and agreeable to them Admit weight: 56.7 kg Current weight: 56.7 kg  Weight history: 02/04/23 56.7 kg  03/20/22 61.2 kg  12/10/21 55.4 kg  10/30/21 63.5 kg  03/09/21 61.2 kg      Average Meal Intake: Pt NPO since 10/13  Nutritionally Relevant Medications: Scheduled Meds:  amLODipine  5 mg Oral Daily   folic acid  1 mg Oral Daily   multivitamin with minerals  1 tablet Oral Daily   nicotine  7 mg Transdermal Daily   thiamine  100 mg Oral Daily   Or   thiamine  100 mg Intravenous Daily     PRN Meds:.[MAR Hold] HYDROcodone-acetaminophen, [MAR Hold]  HYDROmorphone (DILAUDID) injection, [MAR Hold] LORazepam **OR** [MAR Hold] LORazepam, [MAR Hold] methocarbamol **OR** [MAR Hold] methocarbamol (ROBAXIN) IV, [MAR Hold]  morphine injection, [MAR Hold] polyethylene glycol  Labs Reviewed: Na; 133, K: 3.3, Cl;95 CBG ranges from 107-89 mg/dL over the last 24 hours    NUTRITION - FOCUSED  PHYSICAL EXAM:  Flowsheet Row Most Recent Value  Orbital Region Severe depletion  Upper Arm Region Severe depletion  Thoracic and Lumbar Region Severe depletion  Buccal Region Severe depletion  Temple Region Severe depletion  Clavicle Bone Region Severe depletion  Clavicle and Acromion Bone Region Severe depletion  Scapular Bone Region Severe depletion  Dorsal Hand Severe depletion  Patellar Region Severe depletion  Anterior Thigh Region Severe depletion  Posterior Calf Region Severe depletion  Edema (RD Assessment) None  Hair Reviewed  Eyes Reviewed  Mouth Reviewed  Skin Reviewed  Nails Reviewed       Diet Order:   Diet Order             Diet NPO time specified Except for: Sips with Meds, Ice Chips  Diet effective now                   EDUCATION NEEDS:   Education needs have been addressed  Skin:  Skin Assessment: Skin Integrity Issues: Skin Integrity Issues:: Other (Comment) Other: skin tear right elbow  Last BM:  10/13  Height:   Ht Readings from Last 1 Encounters:  02/05/23 6\' 2"  (1.88 m)    Weight:   Wt Readings from Last 1 Encounters:  02/05/23 56 kg    Ideal Body Weight:     BMI:  Body mass index is 15.85 kg/m.  Estimated Nutritional Needs:   Kcal:  1700- 2000 kcal/d  Protein:  75-85 g/d  Fluid:  2ml/kcal    Jamelle Haring RDN, LDN Clinical Dietitian  RDN  pager # available on Amion

## 2023-02-05 NOTE — Progress Notes (Signed)
Pt in short stay awaiting surgery, on monitor. Pt had a 7 beat run of Vtach and frequent PVC's. Anesthesia, Dr. Glade Stanford made aware. Pt denied any cardiac symptoms, chest pain, denies any cardiac history other than HTN. BP 125/73, pulse 101, O2 sats 99% on 2L.  Hang Magnesium sulfate 2g now.

## 2023-02-05 NOTE — Anesthesia Procedure Notes (Signed)
Procedure Name: Intubation Date/Time: 02/05/2023 3:03 PM  Performed by: Gus Puma, CRNAPre-anesthesia Checklist: Patient identified, Emergency Drugs available, Suction available and Patient being monitored Patient Re-evaluated:Patient Re-evaluated prior to induction Oxygen Delivery Method: Circle System Utilized Preoxygenation: Pre-oxygenation with 100% oxygen Induction Type: IV induction Ventilation: Mask ventilation without difficulty Laryngoscope Size: Mac and 4 Grade View: Grade I Tube type: Oral Number of attempts: 1 Airway Equipment and Method: Stylet and Oral airway Placement Confirmation: ETT inserted through vocal cords under direct vision, positive ETCO2 and breath sounds checked- equal and bilateral Secured at: 23 cm Tube secured with: Tape Dental Injury: Teeth and Oropharynx as per pre-operative assessment

## 2023-02-05 NOTE — H&P (View-Only) (Signed)
Reason for Consult:Right hip fx Referring Physician: Candelaria Stagers Time called: [id number] Time at bedside: 0855   Larry [name] is an 72 y.o. male.  HPI: Larry [name] was at an ATM when he lost his balance and fell backwards. He had immediate right hip pain and could not get up. He was brought to [facility] where x-rays showed a right hip fx. Orthopedic surgery was consulted and he was transferred to [facility] for definitive care. He c/o pain in the right hip but otherwise in usual state of health.  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   Hypertension    Shingles    Shortness of breath dyspnea    SOB with exertion   Thyroid disease    hypothyroidism    Past Surgical History:  Procedure Laterality Date   CATARACT EXTRACTION W/ INTRAOCULAR LENS  IMPLANT, BILATERAL     KNEE SURGERY     ORIF HUMERUS FRACTURE Right 09/24/2014   Procedure: OPEN REDUCTION INTERNAL FIXATION (ORIF) PROXIMAL HUMERUS FRACTURE;  Surgeon: Jones Broom, MD;  Location: [facility] OR;  Service: Orthopedics;  Laterality: Right;   TONSILLECTOMY      Family History  Problem Relation Age of Onset   Thyroid disease Mother    Diabetes Other     Social History:  reports that he has been smoking cigarettes. He has never used smokeless tobacco. He reports current alcohol use of about 10.0 standard drinks of alcohol per week. He reports that he does not use drugs.  Allergies: No Known Allergies  Medications: I have reviewed the patient's current medications.  Results for orders placed or performed during the hospital encounter of 02/04/23 (from the past 48 hour(s))  CBC with Differential     Status: Abnormal   Collection Time: 02/04/23  2:55 PM  Result Value Ref Range   WBC 3.7 (L) 4.0 - 10.5 K/uL   RBC 3.36 (L) 4.22 - 5.81 MIL/uL   Hemoglobin 11.8 (L) 13.0 - 17.0 g/dL   HCT 03.4 (L) 74.2 - 59.5 %   MCV 100.6 (H) 80.0 - 100.0 fL   MCH 35.1  (H) 26.0 - 34.0 pg   MCHC 34.9 30.0 - 36.0 g/dL   RDW 63.8 75.6 - 43.3 %   Platelets 159 150 - 400 K/uL   nRBC 0.0 0.0 - 0.2 %   Neutrophils Relative % 55 %   Neutro Abs 2.1 1.7 - 7.7 K/uL   Lymphocytes Relative 31 %   Lymphs Abs 1.1 0.7 - 4.0 K/uL   Monocytes Relative 12 %   Monocytes Absolute 0.4 0.1 - 1.0 K/uL   Eosinophils Relative 1 %   Eosinophils Absolute 0.0 0.0 - 0.5 K/uL   Basophils Relative 1 %   Basophils Absolute 0.0 0.0 - 0.1 K/uL   Immature Granulocytes 0 %   Abs Immature Granulocytes 0.00 0.00 - 0.07 K/uL    Comment: Performed at [facility], [address], [place], [place] [address]  Comprehensive metabolic panel     Status: Abnormal   Collection Time: 02/04/23  2:55 PM  Result Value Ref Range   Sodium 133 (L) 135 - 145 mmol/L   Potassium 3.3 (L) 3.5 - 5.1 mmol/L   Chloride 95 (L) 98 - 111 mmol/L   CO2 28 22 - 32 mmol/L   Glucose, Bld 107 (H) 70 - 99 mg/dL    Comment: Glucose reference  range applies only to samples taken after fasting for at least 8 hours.   BUN 10 8 - 23 mg/dL   Creatinine, Ser 0.27 0.61 - 1.24 mg/dL   Calcium 8.7 (L) 8.9 - 10.3 mg/dL   Total Protein 6.3 (L) 6.5 - 8.1 g/dL   Albumin 3.8 3.5 - 5.0 g/dL   AST 78 (H) 15 - 41 U/L   ALT 34 0 - 44 U/L   Alkaline Phosphatase 86 38 - 126 U/L   Total Bilirubin 1.3 (H) 0.3 - 1.2 mg/dL   GFR, Estimated >25 >36 mL/min    Comment: (NOTE) Calculated using the CKD-EPI Creatinine Equation (2021)    Anion gap 10 5 - 15    Comment: Performed at [facility], [address], [place], [place] [address]  Ethanol     Status: None   Collection Time: 02/04/23  2:55 PM  Result Value Ref Range   Alcohol, Ethyl (B) <10 <10 mg/dL    Comment: (NOTE) Lowest detectable limit for serum alcohol is 10 mg/dL.  For medical purposes only. Performed at [facility], [name], [place], [place] [address]   Protime-INR     Status: None   Collection Time: 02/04/23  3:15 PM  Result Value Ref Range   Prothrombin  Time 12.9 11.4 - 15.2 seconds   INR 1.0 0.8 - 1.2    Comment: (NOTE) INR goal varies based on device and disease states. Performed at [facility], [address] [place], [place] [address]   APTT     Status: None   Collection Time: 02/04/23  3:15 PM  Result Value Ref Range   aPTT 25 24 - 36 seconds    Comment: Performed at [facility], [address] [place], [place] [address]  Urinalysis, Routine w reflex microscopic -Urine, Clean Catch     Status: Abnormal   Collection Time: 02/04/23  5:14 PM  Result Value Ref Range   Color, Urine YELLOW YELLOW   APPearance CLOUDY (A) CLEAR   Specific Gravity, Urine 1.013 1.005 - 1.030   pH 9.0 (H) 5.0 - 8.0   Glucose, UA NEGATIVE NEGATIVE mg/dL   Hgb urine dipstick NEGATIVE NEGATIVE   Bilirubin Urine NEGATIVE NEGATIVE   Ketones, ur 20 (A) NEGATIVE mg/dL   Protein, ur 30 (A) NEGATIVE mg/dL   Nitrite NEGATIVE NEGATIVE   Leukocytes,Ua TRACE (A) NEGATIVE   RBC / HPF 0-5 0 - 5 RBC/hpf   WBC, UA 11-20 0 - 5 WBC/hpf   Bacteria, UA RARE (A) NONE SEEN   Squamous Epithelial / HPF 0-5 0 - 5 /HPF   Amorphous Crystal PRESENT     Comment: Performed at [facility], [address] [place], [place] [address]  Magnesium     Status: Abnormal   Collection Time: 02/04/23 10:37 PM  Result Value Ref Range   Magnesium 1.6 (L) 1.7 - 2.4 mg/dL    Comment: Performed at [facility], [address] [place], [place] [address]  Phosphorus     Status: None   Collection Time: 02/04/23 10:37 PM  Result Value Ref Range   Phosphorus 3.6 2.5 - 4.6 mg/dL    Comment: Performed at [facility], [address], [place], [place] [address]  CBC     Status: Abnormal   Collection Time: 02/05/23  8:19 AM  Result Value Ref Range   WBC 5.6 4.0 - 10.5 K/uL   RBC 2.55 (L) 4.22 - 5.81 MIL/uL   Hemoglobin 8.9 (  L) 13.0 - 17.0 g/dL   HCT 42.6 (L) 83.4 - 19.6 %   MCV 104.7 (H) 80.0 - 100.0 fL   MCH 34.9 (H) 26.0 - 34.0 pg   MCHC 33.3 30.0 - 36.0 g/dL   RDW 22.2 97.9  - 89.2 %   Platelets 144 (L) 150 - 400 K/uL   nRBC 0.0 0.0 - 0.2 %    Comment: Performed at [facility], [address], [place], [place] [address]    CT Head Wo Contrast  Addendum Date: 02/04/2023   ADDENDUM REPORT: 02/04/2023 18:09 ADDENDUM: This addendum addresses the findings section of the report. The brain subheading should state "No hemorrhage" Electronically Signed   By: Lorenza Cambridge M.D.   On: 02/04/2023 18:09   Result Date: 02/04/2023 CLINICAL DATA:  Head trauma, minor (Age >= 65y) EXAM: CT HEAD WITHOUT CONTRAST TECHNIQUE: Contiguous axial images were obtained from the base of the skull through the vertex without intravenous contrast. RADIATION DOSE REDUCTION: This exam was performed according to the departmental dose-optimization program which includes automated exposure control, adjustment of the mA and/or kV according to patient size and/or use of iterative reconstruction technique. COMPARISON:  CT head 12/10/21 FINDINGS: Brain: Hemorrhage. No hydrocephalus. No extra-axial fluid collection. No CT evidence of an acute cortical infarct. No mass effect. No mass lesion. Generalized volume loss without lobar predominance. There is sequela of mild chronic microvascular ischemic change. Vascular: No hyperdense vessel or unexpected calcification. Skull: Soft tissue swelling over the left frontal scalp. No evidence of underlying calvarial fracture. Sinuses/Orbits: No middle ear or mastoid effusion. Paranasal sinuses are clear. Bilateral lens replacement. Orbits are otherwise unremarkable. Other: None. IMPRESSION: Soft tissue swelling over the left frontal scalp. No evidence of underlying calvarial fracture or acute intracranial abnormality. Electronically Signed: By: Lorenza Cambridge M.D. On: 02/04/2023 16:50   DG Elbow Complete Right  Result Date: 02/04/2023 CLINICAL DATA:  Status post fall. EXAM: RIGHT ELBOW - COMPLETE 3+ VIEW COMPARISON:  None Available. FINDINGS: Diminished exam detail  due to suboptimal lateral projection radiograph. Within this limitation, there is no sign of acute fracture or dislocation. Lack of true lateral radiograph limits assessment for joint effusion which may suggest an occult radial head fracture. Soft tissues appear normal. IMPRESSION: 1. No acute findings. 2. Diminished exam detail due to suboptimal lateral projection radiograph. If there is a high clinical concern for elbow fracture then repeat imaging with true lateral projection radiograph is recommended. Electronically Signed   By: Signa Kell M.D.   On: 02/04/2023 17:33   DG Chest Portable 1 View  Result Date: 02/04/2023 CLINICAL DATA:  Fall.  Right hip fracture. EXAM: PORTABLE CHEST 1 VIEW COMPARISON:  Chest x-ray dated March 20, 2022. FINDINGS: The heart size and mediastinal contours are within normal limits. Normal pulmonary vascularity. Lungs remain hyperinflated with emphysematous changes. Scattered sequelae of prior granulomatous disease in the mediastinum, hila, and both lungs, unchanged. No focal consolidation, pleural effusion, or pneumothorax. No acute osseous abnormality. Chronically dislocated right shoulder. Old bilateral rib and left distal clavicle fractures. Multiple chronic thoracic compression deformities. IMPRESSION: 1. No active disease. 2. COPD.  Old granulomatous disease. Electronically Signed   By: Obie Dredge M.D.   On: 02/04/2023 17:32   DG Shoulder Right  Result Date: 02/04/2023 CLINICAL DATA:  Fall EXAM: RIGHT SHOULDER - 2+ VIEW COMPARISON:  Chest x-ray 03/20/2022 FINDINGS: There is inferior dislocation of the humeral head. This appears similar to chest x-ray 03/20/2022. There is no acute  fracture. There is proximal humeral sideplate and screws. Exostosis along the medial aspect of the humeral head appears unchanged from the prior examination. The bones are diffusely osteopenic. Granulomatous disease noted in the lungs, unchanged. IMPRESSION: 1. Inferior dislocation  of the right humeral head, possibly chronic. No acute fractures identified. 2. Osteopenia. Electronically Signed   By: Darliss Cheney M.D.   On: 02/04/2023 17:30   DG Hip Unilat W or Wo Pelvis 2-3 Views Right  Result Date: 02/04/2023 CLINICAL DATA:  Fall. EXAM: DG HIP (WITH OR WITHOUT PELVIS) 2-3V RIGHT COMPARISON:  None Available. FINDINGS: Right femoral intratrochanteric fracture is present, comminuted. There is superolateral displacement of the distal fracture fragment. There is no evidence for dislocation. The bones are osteopenic. Artifact overlies pelvis. IMPRESSION: Comminuted, displaced right femoral intratrochanteric fracture. Electronically Signed   By: Darliss Cheney M.D.   On: 02/04/2023 17:28    Review of Systems  HENT:  Negative for ear discharge, ear pain, hearing loss and tinnitus.   Eyes:  Negative for photophobia and pain.  Respiratory:  Negative for cough and shortness of breath.   Cardiovascular:  Negative for chest pain.  Gastrointestinal:  Negative for abdominal pain, nausea and vomiting.  Genitourinary:  Negative for dysuria, flank pain, frequency and urgency.  Musculoskeletal:  Positive for arthralgias (Right hip). Negative for back pain, myalgias and neck pain.  Neurological:  Negative for dizziness and headaches.  Hematological:  Does not bruise/bleed easily.  Psychiatric/Behavioral:  The patient is not nervous/anxious.    Blood pressure 115/69, pulse 98, temperature 97.6 F (36.4 C), resp. rate 18, height 6\' 2"  (1.88 m), weight 56.7 kg, SpO2 95%. Physical Exam Constitutional:      General: He is not in acute distress.    Appearance: He is well-developed. He is not diaphoretic.  HENT:     Head: Normocephalic and atraumatic.  Eyes:     General: No scleral icterus.       Right eye: No discharge.        Left eye: No discharge.     Conjunctiva/sclera: Conjunctivae normal.  Cardiovascular:     Rate and Rhythm: Normal rate and regular rhythm.  Pulmonary:      Effort: Pulmonary effort is normal. No respiratory distress.  Musculoskeletal:     Cervical back: Normal range of motion.     Comments: RLE No traumatic wounds, ecchymosis, or rash  Mod TTP hip  No knee or ankle effusion  Knee stable to varus/ valgus and anterior/posterior stress  Sens DPN, SPN, TN intact  Motor EHL, ext, flex, evers 5/5  DP 2+, PT 2+, No significant edema  Skin:    General: Skin is warm and dry.  Neurological:     Mental Status: He is alert.  Psychiatric:        Mood and Affect: Mood normal.        Behavior: Behavior normal.     Assessment/Plan: Right hip fx -- Plan IMN today with Dr. Jena Gauss. Please keep NPO. Multiple medical problems including alcohol abuse, COPD, hypertension, BPH, pneumoconiosis, and tobacco abuse -- per primary service    Freeman Caldron, PA-C Orthopedic Surgery [phone] 02/05/2023, 9:04 AM

## 2023-02-05 NOTE — Op Note (Signed)
Orthopaedic Surgery Operative Note (CSN: [id number] ) Date of Surgery: 02/05/2023  Admit Date: 02/04/2023   Diagnoses: Pre-Op Diagnoses: Right intertrochanteric femur fracture   Post-Op Diagnosis: Same  Procedures: CPT 27245-Cephalomedullary nailing of right intertrochanteric femur fracture  Surgeons : Primary: Roby Lofts, MD  Assistant: Darron Doom, RNFA  Location: OR 3   Anesthesia: General   Antibiotics: Ancef 2g preop with 1gm vancomycin powder placed topically   Tourniquet time: None    Estimated Blood Loss: 250 mL  Complications:* No complications entered in OR log *   Specimens:* No specimens in log *   Implants: Implant Name Type Inv. Item Serial No. Manufacturer Lot No. LRB No. Used Action  NAIL INTERTAN 10X18 130D 10S - [id number] Nail NAIL INTERTAN 10X18 130D 10S  [employer] [id number] Right 1 Implanted  SCREW LAG COMPR KIT 105/100 - [id number] Screw SCREW LAG COMPR KIT 105/100  [employer] [id number] Right 1 Implanted  SCREW TRIGEN LOW PROF 5.0X40 - [id number] Screw SCREW TRIGEN LOW PROF 5.0X40  [employer] [id number] Right 1 Implanted     Indications for Surgery: 72 year old male who sustained a ground-level fall and had a right intertrochanteric femur fracture.  Due to the unstable nature of his injury I recommend proceeding with cephalomedullary nailing of his right hip.  Risks and benefits were discussed with the patient.  Risks included but not limited to bleeding, infection, malunion, nonunion, hardware failure, hardware irritation, nerve and blood vessel injury, DVT, even the possibility anesthetic complications.  He agreed to proceed with surgery and consent was obtained.  Operative Findings: Cephalomedullary nailing of right intertrochanteric femur fracture using [employer] InterTAN 10 x 180 mm nail.  Procedure: The patient was identified in the preoperative holding area. Consent was confirmed  with the patient and their family and all questions were answered. The operative extremity was marked after confirmation with the patient. he was then brought back to the operating room by our anesthesia colleagues.  He was placed under general anesthetic and carefully transferred over to a Hana table.  All bony prominences were well-padded.  Traction was applied to the right lower extremity.  Fluoroscopic imaging showed adequate reduction of the femur.  The right lower extremity was then prepped and draped in usual sterile fashion.  A timeout was performed to verify that the patient, the procedure, and the extremity.  Preoperative antibiotics were dosed.  Small incision proximal to the greater trochanter was made and carried down through skin and subcutaneous tissue.  A curved Mayo was used to find the tip of the greater trochanter.  I then advanced a threaded guidewire at the tip of the greater trochanter into the proximal metaphysis.  I then used an entry reamer to enter the medullary canal.  I then placed a 10 x 180 mm nail down the center of the canal attached to a targeting arm.  Using the targeting arm I then directed a threaded guidewire up into the head neck segment.  I confirmed adequate tip apex distance and measured the length.  I decided to use a 105 mm screw.  I then drilled the path for the compression screw and placed an antirotation bar.  I then drilled the path for the lag screw.  I placed the lag screw and then compressed approximately 7 to 8 mm.  Excellent compression was obtained and I statically locked the proximal portion of the nail.  The targeting arm was used to drill and place  a distal interlocking screw.  Final fluoroscopic imaging was obtained.  The incisions were copiously irrigated.  Layered closure of 2-0 Monocryl and Dermabond was used to close the skin.  Sterile dressings were applied.  The patient was then awoke from anesthesia and taken to the PACU in stable condition.  Post  Op Plan/Instructions: The patient will be weightbearing as tolerated to the right lower extremity.  He will receive postoperative Ancef.  He will be placed on Lovenox for DVT prophylaxis.  Will have him mobilize with physical and Occupational Therapy.  I was present and performed the entire surgery.   Truitt Merle, MD [facility]

## 2023-02-05 NOTE — Interval H&P Note (Signed)
History and Physical Interval Note:  02/05/2023 1:51 PM  Larry [name]  has presented today for surgery, with the diagnosis of RIGHT HIP FRACTURE.  The various methods of treatment have been discussed with the patient and family. After consideration of risks, benefits and other options for treatment, the patient has consented to  Procedure(s): INTRAMEDULLARY (IM) NAIL INTERTROCHANTERIC (Right) as a surgical intervention.  The patient's history has been reviewed, patient examined, no change in status, stable for surgery.  I have reviewed the patient's chart and labs.  Questions were answered to the patient's satisfaction.     Caryn Bee P Nidal Rivet

## 2023-02-05 NOTE — Plan of Care (Signed)

## 2023-02-05 NOTE — Progress Notes (Addendum)
CCC Pre-op Review  Pre-op checklist:   NPO: Since midnight per bedside RN  Labs: Hgb 8.9  Consent: In S&H, bedside nurse aware  H&P: PA  Vitals: WNL  O2 requirements: RA  MAR/PTA review: Completed no BB, Anticoags, GLPs  IV: 20g LUA  Floor nurse name:  Earlie Raveling, RN   Additional info:  PCR ordered Betadine Ancef 2g CIWA protocol

## 2023-02-05 NOTE — TOC CAGE-AID Note (Signed)
Transition of Care [facility]) - CAGE-AID Screening   Patient Details  Name: QUARON DELACRUZ MRN: [id number] Date of Birth: 1951/01/27  Transition of [facility] CM/SW Contact:    Katha Hamming, RN Phone Number: 02/05/2023, 11:57 AM   Clinical Narrative:  Daily alcohol consumption, requests resources. TOC consult for substance abuse already in place  CAGE-AID Screening:    Have You Ever Felt You Ought to Cut Down on Your Drinking or Drug Use?: Yes Have People Annoyed You By Critizing Your Drinking Or Drug Use?: No Have You Felt Bad Or Guilty About Your Drinking Or Drug Use?: No Have You Ever Had a Drink or Used Drugs First Thing In The Morning to Steady Your Nerves or to Get Rid of a Hangover?: Yes CAGE-AID Score: 2  Substance Abuse Education Offered: Yes (TOC consult)

## 2023-02-05 NOTE — Progress Notes (Signed)
Orthopedic Tech Progress Note Patient Details:  Larry [name] 01/24/1951 [id number]  Patient ID: Larry [name], male   DOB: March 20, 1951, 72 y.o.   MRN: [id number] Pt doesn't meet criteria for ohf. Pt must be under 70 to get ohf. Trinna Post 02/05/2023, 12:25 AM

## 2023-02-05 NOTE — Transfer of Care (Signed)
Immediate Anesthesia Transfer of Care Note  Patient: Larry [name]  Procedure(s) Performed: INTRAMEDULLARY (IM) NAIL INTERTROCHANTERIC (Right: Hip)  Patient Location: PACU  Anesthesia Type:General  Level of Consciousness: sedated  Airway & Oxygen Therapy: Patient Spontanous Breathing and Patient connected to nasal cannula oxygen  Post-op Assessment: Report given to RN and Post -op Vital signs reviewed and stable  Post vital signs: Reviewed and stable  Last Vitals:  Vitals Value Taken Time  BP 146/94 02/05/23 1615  Temp    Pulse 104 02/05/23 1617  Resp 20 02/05/23 1617  SpO2 93 % 02/05/23 1617  Vitals shown include unfiled device data.  Last Pain:  Vitals:   02/05/23 1321  TempSrc:   PainSc: 0-No pain      Patients Stated Pain Goal: 0 (02/05/23 1610)  Complications: No notable events documented.

## 2023-02-05 NOTE — Progress Notes (Signed)
Patient arrived to the room and was transferred to the bed. Patient is alert and oriented x4, reported R hip pain 10/10 and headache pain. PRN med given. Patient VS stable. Patient have his top denture in place and eye glasses at bedside. Personal clothing placed in a belonging bag. Patient was placed on tele monitor with cont. Pulse ox. Bed alarm initiated, call bell within reach.  Larry [name]

## 2023-02-05 NOTE — Consult Note (Signed)
Reason for Consult:Right hip fx Referring Physician: Candelaria Stagers Time called: [id number] Time at bedside: 0855   Larry [name] is an 72 y.o. male.  HPI: Larry [name] was at an [employer] when he lost his balance and fell backwards. He had immediate right hip pain and could not get up. He was brought to [facility] where x-rays showed a right hip fx. Orthopedic surgery was consulted and he was transferred to [facility] for definitive care. He c/o pain in the right hip but otherwise in usual state of health.  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   Hypertension    Shingles    Shortness of breath dyspnea    SOB with exertion   Thyroid disease    hypothyroidism    Past Surgical History:  Procedure Laterality Date   CATARACT EXTRACTION W/ INTRAOCULAR LENS  IMPLANT, BILATERAL     KNEE SURGERY     ORIF HUMERUS FRACTURE Right 09/24/2014   Procedure: OPEN REDUCTION INTERNAL FIXATION (ORIF) PROXIMAL HUMERUS FRACTURE;  Surgeon: Jones Broom, MD;  Location: [facility] OR;  Service: Orthopedics;  Laterality: Right;   TONSILLECTOMY      Family History  Problem Relation Age of Onset   Thyroid disease Mother    Diabetes Other     Social History:  reports that he has been smoking cigarettes. He has never used smokeless tobacco. He reports current alcohol use of about 10.0 standard drinks of alcohol per week. He reports that he does not use drugs.  Allergies: No Known Allergies  Medications: I have reviewed the patient's current medications.  Results for orders placed or performed during the hospital encounter of 02/04/23 (from the past 48 hour(s))  CBC with Differential     Status: Abnormal   Collection Time: 02/04/23  2:55 PM  Result Value Ref Range   WBC 3.7 (L) 4.0 - 10.5 K/uL   RBC 3.36 (L) 4.22 - 5.81 MIL/uL   Hemoglobin 11.8 (L) 13.0 - 17.0 g/dL   HCT 11.9 (L) 14.7 - 82.9 %   MCV 100.6 (H) 80.0 - 100.0 fL   MCH 35.1  (H) 26.0 - 34.0 pg   MCHC 34.9 30.0 - 36.0 g/dL   RDW 56.2 13.0 - 86.5 %   Platelets 159 150 - 400 K/uL   nRBC 0.0 0.0 - 0.2 %   Neutrophils Relative % 55 %   Neutro Abs 2.1 1.7 - 7.7 K/uL   Lymphocytes Relative 31 %   Lymphs Abs 1.1 0.7 - 4.0 K/uL   Monocytes Relative 12 %   Monocytes Absolute 0.4 0.1 - 1.0 K/uL   Eosinophils Relative 1 %   Eosinophils Absolute 0.0 0.0 - 0.5 K/uL   Basophils Relative 1 %   Basophils Absolute 0.0 0.0 - 0.1 K/uL   Immature Granulocytes 0 %   Abs Immature Granulocytes 0.00 0.00 - 0.07 K/uL    Comment: Performed at [facility], [address] [place], [place] [address]  Comprehensive metabolic panel     Status: Abnormal   Collection Time: 02/04/23  2:55 PM  Result Value Ref Range   Sodium 133 (L) 135 - 145 mmol/L   Potassium 3.3 (L) 3.5 - 5.1 mmol/L   Chloride 95 (L) 98 - 111 mmol/L   CO2 28 22 - 32 mmol/L   Glucose, Bld 107 (H) 70 - 99 mg/dL    Comment: Glucose reference  range applies only to samples taken after fasting for at least 8 hours.   BUN 10 8 - 23 mg/dL   Creatinine, Ser 1.61 0.61 - 1.24 mg/dL   Calcium 8.7 (L) 8.9 - 10.3 mg/dL   Total Protein 6.3 (L) 6.5 - 8.1 g/dL   Albumin 3.8 3.5 - 5.0 g/dL   AST 78 (H) 15 - 41 U/L   ALT 34 0 - 44 U/L   Alkaline Phosphatase 86 38 - 126 U/L   Total Bilirubin 1.3 (H) 0.3 - 1.2 mg/dL   GFR, Estimated >09 >60 mL/min    Comment: (NOTE) Calculated using the CKD-EPI Creatinine Equation (2021)    Anion gap 10 5 - 15    Comment: Performed at [facility], [address] [place], [place] [address]  Ethanol     Status: None   Collection Time: 02/04/23  2:55 PM  Result Value Ref Range   Alcohol, Ethyl (B) <10 <10 mg/dL    Comment: (NOTE) Lowest detectable limit for serum alcohol is 10 mg/dL.  For medical purposes only. Performed at [facility], [address], [place], [place] [address]   Protime-INR     Status: None   Collection Time: 02/04/23  3:15 PM  Result Value Ref Range   Prothrombin  Time 12.9 11.4 - 15.2 seconds   INR 1.0 0.8 - 1.2    Comment: (NOTE) INR goal varies based on device and disease states. Performed at [facility], [address], [place], [place] [address]   APTT     Status: None   Collection Time: 02/04/23  3:15 PM  Result Value Ref Range   aPTT 25 24 - 36 seconds    Comment: Performed at [facility], [address] [place], [place] [address]  Urinalysis, Routine w reflex microscopic -Urine, Clean Catch     Status: Abnormal   Collection Time: 02/04/23  5:14 PM  Result Value Ref Range   Color, Urine YELLOW YELLOW   APPearance CLOUDY (A) CLEAR   Specific Gravity, Urine 1.013 1.005 - 1.030   pH 9.0 (H) 5.0 - 8.0   Glucose, UA NEGATIVE NEGATIVE mg/dL   Hgb urine dipstick NEGATIVE NEGATIVE   Bilirubin Urine NEGATIVE NEGATIVE   Ketones, ur 20 (A) NEGATIVE mg/dL   Protein, ur 30 (A) NEGATIVE mg/dL   Nitrite NEGATIVE NEGATIVE   Leukocytes,Ua TRACE (A) NEGATIVE   RBC / HPF 0-5 0 - 5 RBC/hpf   WBC, UA 11-20 0 - 5 WBC/hpf   Bacteria, UA RARE (A) NONE SEEN   Squamous Epithelial / HPF 0-5 0 - 5 /HPF   Amorphous Crystal PRESENT     Comment: Performed at [facility], [address] [place], [place] [address]  Magnesium     Status: Abnormal   Collection Time: 02/04/23 10:37 PM  Result Value Ref Range   Magnesium 1.6 (L) 1.7 - 2.4 mg/dL    Comment: Performed at [facility], [address] [place], [place] [address]  Phosphorus     Status: None   Collection Time: 02/04/23 10:37 PM  Result Value Ref Range   Phosphorus 3.6 2.5 - 4.6 mg/dL    Comment: Performed at [facility], [address], [place], [place] [address]  CBC     Status: Abnormal   Collection Time: 02/05/23  8:19 AM  Result Value Ref Range   WBC 5.6 4.0 - 10.5 K/uL   RBC 2.55 (L) 4.22 - 5.81 MIL/uL   Hemoglobin 8.9 (  L) 13.0 - 17.0 g/dL   HCT 14.7 (L) 82.9 - 56.2 %   MCV 104.7 (H) 80.0 - 100.0 fL   MCH 34.9 (H) 26.0 - 34.0 pg   MCHC 33.3 30.0 - 36.0 g/dL   RDW 13.0 86.5  - 78.4 %   Platelets 144 (L) 150 - 400 K/uL   nRBC 0.0 0.0 - 0.2 %    Comment: Performed at [facility], [address], [place], [place] [address]    CT Head Wo Contrast  Addendum Date: 02/04/2023   ADDENDUM REPORT: 02/04/2023 18:09 ADDENDUM: This addendum addresses the findings section of the report. The brain subheading should state "No hemorrhage" Electronically Signed   By: Lorenza Cambridge M.D.   On: 02/04/2023 18:09   Result Date: 02/04/2023 CLINICAL DATA:  Head trauma, minor (Age >= 65y) EXAM: CT HEAD WITHOUT CONTRAST TECHNIQUE: Contiguous axial images were obtained from the base of the skull through the vertex without intravenous contrast. RADIATION DOSE REDUCTION: This exam was performed according to the departmental dose-optimization program which includes automated exposure control, adjustment of the mA and/or kV according to patient size and/or use of iterative reconstruction technique. COMPARISON:  CT head 12/10/21 FINDINGS: Brain: Hemorrhage. No hydrocephalus. No extra-axial fluid collection. No CT evidence of an acute cortical infarct. No mass effect. No mass lesion. Generalized volume loss without lobar predominance. There is sequela of mild chronic microvascular ischemic change. Vascular: No hyperdense vessel or unexpected calcification. Skull: Soft tissue swelling over the left frontal scalp. No evidence of underlying calvarial fracture. Sinuses/Orbits: No middle ear or mastoid effusion. Paranasal sinuses are clear. Bilateral lens replacement. Orbits are otherwise unremarkable. Other: None. IMPRESSION: Soft tissue swelling over the left frontal scalp. No evidence of underlying calvarial fracture or acute intracranial abnormality. Electronically Signed: By: Lorenza Cambridge M.D. On: 02/04/2023 16:50   DG Elbow Complete Right  Result Date: 02/04/2023 CLINICAL DATA:  Status post fall. EXAM: RIGHT ELBOW - COMPLETE 3+ VIEW COMPARISON:  None Available. FINDINGS: Diminished exam detail  due to suboptimal lateral projection radiograph. Within this limitation, there is no sign of acute fracture or dislocation. Lack of true lateral radiograph limits assessment for joint effusion which may suggest an occult radial head fracture. Soft tissues appear normal. IMPRESSION: 1. No acute findings. 2. Diminished exam detail due to suboptimal lateral projection radiograph. If there is a high clinical concern for elbow fracture then repeat imaging with true lateral projection radiograph is recommended. Electronically Signed   By: Signa Kell M.D.   On: 02/04/2023 17:33   DG Chest Portable 1 View  Result Date: 02/04/2023 CLINICAL DATA:  Fall.  Right hip fracture. EXAM: PORTABLE CHEST 1 VIEW COMPARISON:  Chest x-ray dated March 20, 2022. FINDINGS: The heart size and mediastinal contours are within normal limits. Normal pulmonary vascularity. Lungs remain hyperinflated with emphysematous changes. Scattered sequelae of prior granulomatous disease in the mediastinum, hila, and both lungs, unchanged. No focal consolidation, pleural effusion, or pneumothorax. No acute osseous abnormality. Chronically dislocated right shoulder. Old bilateral rib and left distal clavicle fractures. Multiple chronic thoracic compression deformities. IMPRESSION: 1. No active disease. 2. COPD.  Old granulomatous disease. Electronically Signed   By: Obie Dredge M.D.   On: 02/04/2023 17:32   DG Shoulder Right  Result Date: 02/04/2023 CLINICAL DATA:  Fall EXAM: RIGHT SHOULDER - 2+ VIEW COMPARISON:  Chest x-ray 03/20/2022 FINDINGS: There is inferior dislocation of the humeral head. This appears similar to chest x-ray 03/20/2022. There is no acute  fracture. There is proximal humeral sideplate and screws. Exostosis along the medial aspect of the humeral head appears unchanged from the prior examination. The bones are diffusely osteopenic. Granulomatous disease noted in the lungs, unchanged. IMPRESSION: 1. Inferior dislocation  of the right humeral head, possibly chronic. No acute fractures identified. 2. Osteopenia. Electronically Signed   By: Darliss Cheney M.D.   On: 02/04/2023 17:30   DG Hip Unilat W or Wo Pelvis 2-3 Views Right  Result Date: 02/04/2023 CLINICAL DATA:  Fall. EXAM: DG HIP (WITH OR WITHOUT PELVIS) 2-3V RIGHT COMPARISON:  None Available. FINDINGS: Right femoral intratrochanteric fracture is present, comminuted. There is superolateral displacement of the distal fracture fragment. There is no evidence for dislocation. The bones are osteopenic. Artifact overlies pelvis. IMPRESSION: Comminuted, displaced right femoral intratrochanteric fracture. Electronically Signed   By: Darliss Cheney M.D.   On: 02/04/2023 17:28    Review of Systems  HENT:  Negative for ear discharge, ear pain, hearing loss and tinnitus.   Eyes:  Negative for photophobia and pain.  Respiratory:  Negative for cough and shortness of breath.   Cardiovascular:  Negative for chest pain.  Gastrointestinal:  Negative for abdominal pain, nausea and vomiting.  Genitourinary:  Negative for dysuria, flank pain, frequency and urgency.  Musculoskeletal:  Positive for arthralgias (Right hip). Negative for back pain, myalgias and neck pain.  Neurological:  Negative for dizziness and headaches.  Hematological:  Does not bruise/bleed easily.  Psychiatric/Behavioral:  The patient is not nervous/anxious.    Blood pressure 115/69, pulse 98, temperature 97.6 F (36.4 C), resp. rate 18, height 6\' 2"  (1.88 m), weight 56.7 kg, SpO2 95%. Physical Exam Constitutional:      General: He is not in acute distress.    Appearance: He is well-developed. He is not diaphoretic.  HENT:     Head: Normocephalic and atraumatic.  Eyes:     General: No scleral icterus.       Right eye: No discharge.        Left eye: No discharge.     Conjunctiva/sclera: Conjunctivae normal.  Cardiovascular:     Rate and Rhythm: Normal rate and regular rhythm.  Pulmonary:      Effort: Pulmonary effort is normal. No respiratory distress.  Musculoskeletal:     Cervical back: Normal range of motion.     Comments: RLE No traumatic wounds, ecchymosis, or rash  Mod TTP hip  No knee or ankle effusion  Knee stable to varus/ valgus and anterior/posterior stress  Sens DPN, SPN, TN intact  Motor EHL, ext, flex, evers 5/5  DP 2+, PT 2+, No significant edema  Skin:    General: Skin is warm and dry.  Neurological:     Mental Status: He is alert.  Psychiatric:        Mood and Affect: Mood normal.        Behavior: Behavior normal.     Assessment/Plan: Right hip fx -- Plan IMN today with Dr. Jena Gauss. Please keep NPO. Multiple medical problems including alcohol abuse, COPD, hypertension, BPH, pneumoconiosis, and tobacco abuse -- per primary service    Freeman Caldron, PA-C Orthopedic Surgery [phone] 02/05/2023, 9:04 AM

## 2023-02-05 NOTE — Anesthesia Postprocedure Evaluation (Signed)
Anesthesia Post Note  Patient: TAN CLOPPER  Procedure(s) Performed: INTRAMEDULLARY (IM) NAIL INTERTROCHANTERIC (Right: Hip)     Patient location during evaluation: PACU Anesthesia Type: General Level of consciousness: awake and alert Pain management: pain level controlled Vital Signs Assessment: post-procedure vital signs reviewed and stable Respiratory status: spontaneous breathing, nonlabored ventilation, respiratory function stable and patient connected to nasal cannula oxygen Cardiovascular status: blood pressure returned to baseline and stable Postop Assessment: no apparent nausea or vomiting Anesthetic complications: no  No notable events documented.  Last Vitals:  Vitals:   02/05/23 1645 02/05/23 1700  BP: 117/77 (!) 142/69  Pulse: (!) 108 99  Resp: 17 14  Temp:    SpO2: 96% 90%    Last Pain:  Vitals:   02/05/23 1613  TempSrc:   PainSc: Ardean Larsen

## 2023-02-05 NOTE — Anesthesia Preprocedure Evaluation (Signed)
Anesthesia Evaluation  Patient identified by MRN, date of birth, ID band Patient awake    Reviewed: Allergy & Precautions, NPO status , Patient's Chart, lab work & pertinent test results  Airway Mallampati: II  TM Distance: >3 FB Neck ROM: Full    Dental  (+) Dental Advisory Given   Pulmonary COPD, Current Smoker and Patient abstained from smoking.   breath sounds clear to auscultation       Cardiovascular hypertension, Pt. on medications  Rhythm:Regular Rate:Normal     Neuro/Psych negative neurological ROS     GI/Hepatic negative GI ROS, Neg liver ROS,,,  Endo/Other  negative endocrine ROS    Renal/GU negative Renal ROS     Musculoskeletal  (+) Arthritis ,    Abdominal   Peds  Hematology  (+) Blood dyscrasia, anemia   Anesthesia Other Findings   Reproductive/Obstetrics                             Anesthesia Physical Anesthesia Plan  ASA: 3  Anesthesia Plan: General   Post-op Pain Management:    Induction: Intravenous  PONV Risk Score and Plan: 1 and Dexamethasone, Ondansetron and Treatment may vary due to age or medical condition  Airway Management Planned: Oral ETT  Additional Equipment:   Intra-op Plan:   Post-operative Plan: Extubation in OR  Informed Consent: I have reviewed the patients History and Physical, chart, labs and discussed the procedure including the risks, benefits and alternatives for the proposed anesthesia with the patient or authorized representative who has indicated his/her understanding and acceptance.     Dental advisory given  Plan Discussed with: CRNA  Anesthesia Plan Comments:        Anesthesia Quick Evaluation

## 2023-02-06 ENCOUNTER — Encounter (HOSPITAL_COMMUNITY): Payer: Self-pay | Admitting: Student

## 2023-02-06 DIAGNOSIS — D62 Acute posthemorrhagic anemia: Secondary | ICD-10-CM

## 2023-02-06 DIAGNOSIS — S72001A Fracture of unspecified part of neck of right femur, initial encounter for closed fracture: Secondary | ICD-10-CM | POA: Diagnosis not present

## 2023-02-06 DIAGNOSIS — F1010 Alcohol abuse, uncomplicated: Secondary | ICD-10-CM | POA: Diagnosis not present

## 2023-02-06 DIAGNOSIS — E43 Unspecified severe protein-calorie malnutrition: Secondary | ICD-10-CM

## 2023-02-06 DIAGNOSIS — N400 Enlarged prostate without lower urinary tract symptoms: Secondary | ICD-10-CM | POA: Diagnosis not present

## 2023-02-06 DIAGNOSIS — W19XXXA Unspecified fall, initial encounter: Secondary | ICD-10-CM | POA: Diagnosis not present

## 2023-02-06 LAB — COMPREHENSIVE METABOLIC PANEL
ALT: 23 U/L (ref 0–44)
AST: 80 U/L — ABNORMAL HIGH (ref 15–41)
Albumin: 2.7 g/dL — ABNORMAL LOW (ref 3.5–5.0)
Alkaline Phosphatase: 95 U/L (ref 38–126)
Anion gap: 10 (ref 5–15)
BUN: 17 mg/dL (ref 8–23)
CO2: 26 mmol/L (ref 22–32)
Calcium: 8.4 mg/dL — ABNORMAL LOW (ref 8.9–10.3)
Chloride: 99 mmol/L (ref 98–111)
Creatinine, Ser: 0.85 mg/dL (ref 0.61–1.24)
GFR, Estimated: >60 mL/min (ref >60)
Glucose, Bld: 141 mg/dL — ABNORMAL HIGH (ref 70–99)
Potassium: 3.9 mmol/L (ref 3.5–5.1)
Sodium: 135 mmol/L (ref 135–145)
Total Bilirubin: 0.6 mg/dL (ref 0.3–1.2)
Total Protein: 4.9 g/dL — ABNORMAL LOW (ref 6.5–8.1)

## 2023-02-06 LAB — CBC
HCT: 22.2 % — ABNORMAL LOW (ref 39.0–52.0)
Hemoglobin: 7.6 g/dL — ABNORMAL LOW (ref 13.0–17.0)
MCH: 35.7 pg — ABNORMAL HIGH (ref 26.0–34.0)
MCHC: 34.2 g/dL (ref 30.0–36.0)
MCV: 104.2 fL — ABNORMAL HIGH (ref 80.0–100.0)
Platelets: 142 10*3/uL — ABNORMAL LOW (ref 150–400)
RBC: 2.13 MIL/uL — ABNORMAL LOW (ref 4.22–5.81)
RDW: 14.6 % (ref 11.5–15.5)
WBC: 6 10*3/uL (ref 4.0–10.5)
nRBC: 0 % (ref 0.0–0.2)

## 2023-02-06 LAB — HEMOGLOBIN AND HEMATOCRIT, BLOOD
HCT: 20.8 % — ABNORMAL LOW (ref 39.0–52.0)
HCT: 20.6 % — ABNORMAL LOW (ref 39.0–52.0)
Hemoglobin: 7 g/dL — ABNORMAL LOW (ref 13.0–17.0)
Hemoglobin: 7.1 g/dL — ABNORMAL LOW (ref 13.0–17.0)

## 2023-02-06 LAB — PHOSPHORUS: Phosphorus: 3.4 mg/dL (ref 2.5–4.6)

## 2023-02-06 LAB — MAGNESIUM: Magnesium: 1.9 mg/dL (ref 1.7–2.4)

## 2023-02-06 MED ORDER — FLUMAZENIL 0.5 MG/5ML IV SOLN
0.2000 mg | Freq: Once | INTRAVENOUS | Status: AC | PRN
  Administered 2023-02-06: 0.2 mg via INTRAVENOUS
  Filled 2023-02-06: qty 5

## 2023-02-06 MED ORDER — HYDROCODONE-ACETAMINOPHEN 5-325 MG PO TABS
1.0000 | ORAL_TABLET | Freq: Four times a day (QID) | ORAL | Status: DC | PRN
  Administered 2023-02-08 – 2023-02-13 (×8): 1 via ORAL
  Filled 2023-02-06 (×8): qty 1

## 2023-02-06 NOTE — Progress Notes (Signed)
PROGRESS NOTE    Larry [name]  WUJ:[id number] DOB: [birth year] DOA: 02/04/2023 PCP: Benita Stabile, MD  Subjective: Pt seen and examined. Received 4 MG IV ativan at [phone] for CIWA score >20 Pt unarousable this AM.  Also received 0.5 mg IV dilaudid at the same time. Now pt on supplemental O2   Hospital Course: HPI: Larry [name] is a 72 y.o. male with medical history significant for alcohol abuse, COPD, hypertension, BPH, pneumoconiosis,  tobacco abuse Patient presented to the ED reports of a fall.   Patient was trying to use the ATM  the bank across the street, he got out of his truck, and fell on the street.  He reports dizziness over the past several days present when standing, he reports feeling unwell, with poor oral intake over the past 2 to 3 weeks.  No vomiting.  Reports occasional diarrhea.  Reports chronic difficulty voiding.  No chest pain.  Chronic and unchanged difficulty breathing. Drinks alcohol daily, he drinks about half of 1/5 of vodka.  He reports his last drink was today at about 10 AM.  He reports he drank a small amount, compared to what he normally drinks.  Reports a history of DTs requiring hospitalization.  Significant Events: Admitted 02/04/2023 for right intertrochanteric fracture 02-05-2023 IM nailing right intertrochanteric fracture  Significant Labs: Admitting HgB 11.8 02-06-2023 HgB 7.6  Significant Imaging Studies: Admission right hip XR shows Comminuted, displaced right femoral intratrochanteric fracture   Antibiotic Therapy: Anti-infectives (From admission, onward)    Start     Dose/Rate Route Frequency Ordered Stop   02/06/23 0600  ceFAZolin (ANCEF) IVPB 2g/100 mL premix        2 g 200 mL/hr over 30 Minutes Intravenous On call to O.R. 02/05/23 1243 02/05/23 1510   02/05/23 2200  ceFAZolin (ANCEF) IVPB 2g/100 mL premix        2 g 200 mL/hr over 30 Minutes Intravenous Every 8 hours 02/05/23 1920 02/06/23 2159       Procedures: 02-05-2023  IM nail right intertroch fracture  Consultants: Orthopedic(haddix)    Assessment and Plan: * Closed right hip fracture (HCC) 02-04-2023 S/p Fall.  Pelvic x-ray - comminuted, displaced right femoral intratrochanteric fracture.  EDP talked to Dr. Charlann Boxer, will see in consult, admit to [facility], possible surgery 10/1.  N.P.O. midnight -IV Dilaudid 0.5 mg every 4 hourly prn -Chest X-ray clear, obtain Pre-op EKG  02-05-2023 had IM nailing of right femur  02-06-2023 will need SNF placement.  Fall at home, initial encounter 02-04-2023 Reports orthostatic dizziness with resultant fall and subsequent hip fracture.  Head CT-soft tissue swelling left frontal scalp. Fall in the setting of chronic alcohol abuse.  patient reports poor oral intake over the past 2 to 3 weeks.  Tachycardic heart rate 94-104 dizziness is not new. - N/s + 20kcl 75cc/hr x 12hrs  02-06-2023. Pt now is s/p IM nail to right femur for intertrochanteric fracture. Will need PT and CM for SNF.   Alcohol abuse Drinks half of 1/5 of vodka daily.  Last alcoholic beverage was this morning at about 10 AM.  Blood alcohol level unremarkable.  Prior hospitalization for alcohol withdrawal,- 11/2021 patient did well with CIWA protocol. -CIWA as needed -Thiamine, folate, multivitamins -Check mag, Phos  02-06-2023 pt received 4 mg IV push ativan. Pt is now somnolent and requiring supplemental O2.   Hypokalemia 02-04-2023 Mild hypokalemia. Potassium 3.3. 02-06-2023 resolved after replacement  Protein-calorie malnutrition, severe (HCC) See RD note 02-05-2023 Severe  Malnutrition related to social / environmental circumstances as evidenced by meal completion < 25%, severe muscle depletion, severe fat depletion.    COPD (chronic obstructive pulmonary disease) (HCC) History of COPD and pneumoconiosis.  Chest x-ray clear. Not On home O2.  BPH (benign prostatic hyperplasia) Stable.  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 BMI  16. -Nuttrition consult says he has severe protein calorie malnutrition.  Hypertension Stable. -Resume Norvasc 02-06-2023 BP stable  Acute postoperative anemia due to expected blood loss 02-06-2023 drop in HgB from 11.8(admission) to 7.6(today). likely a combo bleeding from initial fracture and recent surgery. No need for transfusion at this point.       DVT prophylaxis: enoxaparin (LOVENOX) injection 40 mg Start: 02/06/23 0900 SCDs Start: 02/04/23 2154    Code Status: Full Code Family Communication: no family at bedside Disposition Plan: SNF Reason for continuing need for hospitalization: needs SNF placement.  Objective: Vitals:   02/05/23 1939 02/06/23 0354 02/06/23 0736 02/06/23 1320  BP: 135/74 111/67 121/87 (!) 147/68  Pulse: (!) 104 99 (!) 108 (!) 105  Resp:  18 16   Temp: 98 F (36.7 C) 98.4 F (36.9 C) 98 F (36.7 C)   TempSrc: Oral Oral    SpO2:  98% 94% 100%  Weight:      Height:        Intake/Output Summary (Last 24 hours) at 02/06/2023 1353 Last data filed at 02/06/2023 0600 Gross per 24 hour  Intake 860.82 ml  Output 300 ml  Net 560.82 ml   Filed Weights   02/04/23 1409 02/05/23 1307  Weight: 56.7 kg 56 kg    Examination:  Physical Exam Vitals and nursing note reviewed.  Constitutional:      Comments: Somnolent, unarousable  HENT:     Head: Normocephalic and atraumatic.  Eyes:     General: No scleral icterus. Cardiovascular:     Rate and Rhythm: Normal rate and regular rhythm.  Pulmonary:     Effort: Pulmonary effort is normal.  Abdominal:     General: Abdomen is flat. Bowel sounds are normal.  Skin:    General: Skin is warm and dry.     Capillary Refill: Capillary refill takes less than 2 seconds.     Comments: Right greater troch dressing dry and intacted     Data Reviewed: I have personally reviewed following labs and imaging studies  CBC: Recent Labs  Lab 02/04/23 1455 02/05/23 0819 02/05/23 1205 02/05/23 2220  02/06/23 0510 02/06/23 1119  WBC 3.7* 5.6  --   --  6.0  --   NEUTROABS 2.1  --   --   --   --   --   HGB 11.8* 8.9* 8.0* 7.2* 7.6* 7.0*  HCT 33.8* 26.7* 24.0* 21.3* 22.2* 20.8*  MCV 100.6* 104.7*  --   --  104.2*  --   PLT 159 144*  --   --  142*  --    Basic Metabolic Panel: Recent Labs  Lab 02/04/23 1455 02/04/23 2237 02/05/23 0819 02/06/23 0510  NA 133*  --  136 135  K 3.3*  --  4.4 3.9  CL 95*  --  102 99  CO2 28  --  25 26  GLUCOSE 107*  --  89 141*  BUN 10  --  11 17  CREATININE 0.64  --  0.70 0.85  CALCIUM 8.7*  --  8.4* 8.4*  MG  --  1.6* 1.6* 1.9  PHOS  --  3.6 3.9 3.4  GFR: Estimated Creatinine Clearance: 62.2 mL/min (by C-G formula based on SCr of 0.85 mg/dL). Liver Function Tests: Recent Labs  Lab 02/04/23 1455 02/05/23 0819 02/06/23 0510  AST 78* 34 80*  ALT 34 23 23  ALKPHOS 86 67 95  BILITOT 1.3* 1.1 0.6  PROT 6.3* 5.2* 4.9*  ALBUMIN 3.8 3.0* 2.7*   Coagulation Profile: Recent Labs  Lab 02/04/23 1515  INR 1.0    Recent Results (from the past 240 hour(s))  Surgical pcr screen     Status: None   Collection Time: 02/05/23 10:09 AM   Specimen: Nasal Mucosa; Nasal Swab  Result Value Ref Range Status   MRSA, PCR NEGATIVE NEGATIVE Final   Staphylococcus aureus NEGATIVE NEGATIVE Final    Comment: (NOTE) The Xpert SA Assay (FDA approved for NASAL specimens in patients 52 years of age and older), is one component of a comprehensive surveillance program. It is not intended to diagnose infection nor to guide or monitor treatment. Performed at [facility], [address] [place], [place] [address]      Radiology Studies: DG HIP UNILAT WITH PELVIS 1V RIGHT  Result Date: 02/05/2023 CLINICAL DATA:  Postop. EXAM: DG HIP (WITH OR WITHOUT PELVIS) 1V RIGHT COMPARISON:  Preoperative imaging. FINDINGS: Femoral intramedullary nail with trans trochanteric and distal locking screw fixation traverse intertrochanteric femur fracture. Improved  fracture alignment from preoperative imaging. There is persistent displacement of the lesser and greater trochanters. Recent postsurgical change includes air and edema in the soft tissues. IMPRESSION: Status post ORIF of intertrochanteric femur fracture. No immediate postoperative complication. Electronically Signed   By: Narda Rutherford M.D.   On: 02/05/2023 17:46   DG FEMUR, MIN 2 VIEWS RIGHT  Result Date: 02/05/2023 CLINICAL DATA:  Elective surgery. EXAM: RIGHT FEMUR 2 VIEWS COMPARISON:  Preoperative imaging. FINDINGS: Five fluoroscopic spot views of the right proximal femur obtained in the operating room. Femoral intramedullary nail with trans trochanteric and distal locking screw fixation traverse intertrochanteric femur fracture. Fluoroscopy time 1 minutes 2 seconds. Dose 6.08 mGy. IMPRESSION: Intraoperative fluoroscopy during ORIF of intertrochanteric femur fracture. Electronically Signed   By: Narda Rutherford M.D.   On: 02/05/2023 17:46   CT Head Wo Contrast  Addendum Date: 02/04/2023   ADDENDUM REPORT: 02/04/2023 18:09 ADDENDUM: This addendum addresses the findings section of the report. The brain subheading should state "No hemorrhage" Electronically Signed   By: Lorenza Cambridge M.D.   On: 02/04/2023 18:09   Result Date: 02/04/2023 CLINICAL DATA:  Head trauma, minor (Age >= 65y) EXAM: CT HEAD WITHOUT CONTRAST TECHNIQUE: Contiguous axial images were obtained from the base of the skull through the vertex without intravenous contrast. RADIATION DOSE REDUCTION: This exam was performed according to the departmental dose-optimization program which includes automated exposure control, adjustment of the mA and/or kV according to patient size and/or use of iterative reconstruction technique. COMPARISON:  CT head 12/10/21 FINDINGS: Brain: Hemorrhage. No hydrocephalus. No extra-axial fluid collection. No CT evidence of an acute cortical infarct. No mass effect. No mass lesion. Generalized volume loss  without lobar predominance. There is sequela of mild chronic microvascular ischemic change. Vascular: No hyperdense vessel or unexpected calcification. Skull: Soft tissue swelling over the left frontal scalp. No evidence of underlying calvarial fracture. Sinuses/Orbits: No middle ear or mastoid effusion. Paranasal sinuses are clear. Bilateral lens replacement. Orbits are otherwise unremarkable. Other: None. IMPRESSION: Soft tissue swelling over the left frontal scalp. No evidence of underlying calvarial fracture or acute intracranial abnormality. Electronically Signed: By:  Lorenza Cambridge M.D. On: 02/04/2023 16:50   DG Elbow Complete Right  Result Date: 02/04/2023 CLINICAL DATA:  Status post fall. EXAM: RIGHT ELBOW - COMPLETE 3+ VIEW COMPARISON:  None Available. FINDINGS: Diminished exam detail due to suboptimal lateral projection radiograph. Within this limitation, there is no sign of acute fracture or dislocation. Lack of true lateral radiograph limits assessment for joint effusion which may suggest an occult radial head fracture. Soft tissues appear normal. IMPRESSION: 1. No acute findings. 2. Diminished exam detail due to suboptimal lateral projection radiograph. If there is a high clinical concern for elbow fracture then repeat imaging with true lateral projection radiograph is recommended. Electronically Signed   By: Signa Kell M.D.   On: 02/04/2023 17:33   DG Chest Portable 1 View  Result Date: 02/04/2023 CLINICAL DATA:  Fall.  Right hip fracture. EXAM: PORTABLE CHEST 1 VIEW COMPARISON:  Chest x-ray dated March 20, 2022. FINDINGS: The heart size and mediastinal contours are within normal limits. Normal pulmonary vascularity. Lungs remain hyperinflated with emphysematous changes. Scattered sequelae of prior granulomatous disease in the mediastinum, hila, and both lungs, unchanged. No focal consolidation, pleural effusion, or pneumothorax. No acute osseous abnormality. Chronically dislocated  right shoulder. Old bilateral rib and left distal clavicle fractures. Multiple chronic thoracic compression deformities. IMPRESSION: 1. No active disease. 2. COPD.  Old granulomatous disease. Electronically Signed   By: Obie Dredge M.D.   On: 02/04/2023 17:32   DG Shoulder Right  Result Date: 02/04/2023 CLINICAL DATA:  Fall EXAM: RIGHT SHOULDER - 2+ VIEW COMPARISON:  Chest x-ray 03/20/2022 FINDINGS: There is inferior dislocation of the humeral head. This appears similar to chest x-ray 03/20/2022. There is no acute fracture. There is proximal humeral sideplate and screws. Exostosis along the medial aspect of the humeral head appears unchanged from the prior examination. The bones are diffusely osteopenic. Granulomatous disease noted in the lungs, unchanged. IMPRESSION: 1. Inferior dislocation of the right humeral head, possibly chronic. No acute fractures identified. 2. Osteopenia. Electronically Signed   By: Darliss Cheney M.D.   On: 02/04/2023 17:30   DG Hip Unilat W or Wo Pelvis 2-3 Views Right  Result Date: 02/04/2023 CLINICAL DATA:  Fall. EXAM: DG HIP (WITH OR WITHOUT PELVIS) 2-3V RIGHT COMPARISON:  None Available. FINDINGS: Right femoral intratrochanteric fracture is present, comminuted. There is superolateral displacement of the distal fracture fragment. There is no evidence for dislocation. The bones are osteopenic. Artifact overlies pelvis. IMPRESSION: Comminuted, displaced right femoral intratrochanteric fracture. Electronically Signed   By: Darliss Cheney M.D.   On: 02/04/2023 17:28    Scheduled Meds:  amLODipine  5 mg Oral Daily   enoxaparin (LOVENOX) injection  40 mg Subcutaneous Q24H   folic acid  1 mg Oral Daily   multivitamin with minerals  1 tablet Oral Daily   nicotine  7 mg Transdermal Daily   thiamine  100 mg Oral Daily   Or   thiamine  100 mg Intravenous Daily   Continuous Infusions:   ceFAZolin (ANCEF) IV 2 g (02/06/23 0508)   methocarbamol (ROBAXIN) IV       LOS: 2  days   Time spent: 35 minutes  Carollee Herter, DO  [facility]  02/06/2023, 1:53 PM

## 2023-02-06 NOTE — Assessment & Plan Note (Signed)
Stable

## 2023-02-06 NOTE — Evaluation (Signed)
Physical Therapy Evaluation Patient Details Name: Larry [name] MRN: [id number] DOB: 08/09/1950 Today's Date: 02/06/2023  History of Present Illness  72 y.o. male presented to the ED 02/04/23 with reports of a fall. +comminuted, displaced right femoral intratrochanteric fracture. Head CT swelling Lt frontal scalp. Underwent IM nailing of R hip on 10/14. PMH significant for alcohol abuse, COPD, hypertension, BPH, pneumoconiosis,  tobacco abuse  Clinical Impression   Pt admitted secondary to problem above with deficits below. PTA patient was living alone and reports recent falls. He was not using an assistive device PTA.  Pt currently requires +2 assist with all bed mobility and refused to attempt RLE ROM or sitting EOB due to pain. Patient resists movement. Patient very shaky and RN reports he is going into DTs. As pt lives alone, will need post-acute rehab with anticipated <3hrs therapy per day.  Anticipate patient will benefit from PT to address problems listed below.Will continue to follow acutely to maximize functional mobility independence and safety.           If plan is discharge home, recommend the following: Other (comment) (currently not appropriate for home)   Can travel by private vehicle   No    Equipment Recommendations None recommended by PT  Recommendations for Other Services       Functional Status Assessment Patient has had a recent decline in their functional status and demonstrates the ability to make significant improvements in function in a reasonable and predictable amount of time.     Precautions / Restrictions Precautions Precautions: Fall Restrictions Weight Bearing Restrictions: Yes RLE Weight Bearing: Weight bearing as tolerated      Mobility  Bed Mobility Overal bed mobility: Needs Assistance Bed Mobility: Rolling Rolling: Max assist, +2 for physical assistance, +2 for safety/equipment         General bed mobility comments: Pt declined EOB  attempts. Max A x 2 to scoot up in bed (after much education/encouragement for safe positioning to eat breakfast). Max A x 2 to roll to L to place bed pad under bottom.    Transfers                   General transfer comment: pt declined today    Ambulation/Gait                  Stairs            Wheelchair Mobility     Tilt Bed    Modified Rankin (Stroke Patients Only)       Balance                                             Pertinent Vitals/Pain Pain Assessment Pain Assessment: Faces Faces Pain Scale: Hurts even more Pain Location: R hip Pain Descriptors / Indicators: Grimacing, Guarding Pain Intervention(s): Limited activity within patient's tolerance, Monitored during session (pt refused ice)    Home Living Family/patient expects to be discharged to:: Private residence Living Arrangements: Spouse/significant other Available Help at Discharge: Neighbor;Available PRN/intermittently Type of Home: Mobile home Home Access: Stairs to enter Entrance Stairs-Rails: Right;Left Entrance Stairs-Number of Steps: 4   Home Layout: One level Home Equipment: Agricultural consultant (2 wheels)      Prior Function Prior Level of Function : Independent/Modified Independent;Driving             Mobility Comments:  no AD normally, reports 2 recent falls at home, has not been feeling well in 2-3 weeks ADLs Comments: Typically indep with ADLs, IADLs but increasing difficulty. has been sponge bathing for 4-5 months due to fear of falling in shower. reports neighbors have been helping with groceries in recent weeks due to not feeling well     Extremity/Trunk Assessment   Upper Extremity Assessment Upper Extremity Assessment: Defer to OT evaluation    Lower Extremity Assessment Lower Extremity Assessment: Generalized weakness;RLE deficits/detail RLE Deficits / Details: limited ability to assess due to pain and resisting movement; ankle DF WFL,  able to achieve full knee extension with quad set    Cervical / Trunk Assessment Cervical / Trunk Assessment: Normal  Communication   Communication Communication: No apparent difficulties Cueing Techniques: Verbal cues;Tactile cues  Cognition Arousal: Alert Behavior During Therapy: WFL for tasks assessed/performed Overall Cognitive Status: No family/caregiver present to determine baseline cognitive functioning                                 General Comments: potential to be easily agitated, cues/education needed for problem solving and importance of movement after surgery. showing insight into deficits (pain, shakiness and difficulties at home)        General Comments General comments (skin integrity, edema, etc.): Wife (separated per chart) called during session    Exercises General Exercises - Lower Extremity Ankle Circles/Pumps: AROM, 5 reps Quad Sets: AROM, Right, 5 reps   Assessment/Plan    PT Assessment Patient needs continued PT services  PT Problem List Decreased strength;Decreased range of motion;Decreased activity tolerance;Decreased balance;Decreased mobility;Decreased cognition;Decreased knowledge of use of DME;Decreased safety awareness;Decreased knowledge of precautions;Pain       PT Treatment Interventions DME instruction;Gait training;Functional mobility training;Therapeutic activities;Therapeutic exercise;Balance training;Cognitive remediation;Patient/family education    PT Goals (Current goals can be found in the Care Plan section)  Acute Rehab PT Goals Patient Stated Goal: to minimize pain PT Goal Formulation: With patient Time For Goal Achievement: 02/20/23 Potential to Achieve Goals: Fair    Frequency Min 1X/week     Co-evaluation PT/OT/SLP Co-Evaluation/Treatment: Yes Reason for Co-Treatment: For patient/therapist safety;To address functional/ADL transfers;Complexity of the patient's impairments (multi-system involvement) PT goals  addressed during session: Strengthening/ROM;Mobility/safety with mobility OT goals addressed during session: ADL's and self-care       AM-PAC PT "6 Clicks" Mobility  Outcome Measure Help needed turning from your back to your side while in a flat bed without using bedrails?: Total Help needed moving from lying on your back to sitting on the side of a flat bed without using bedrails?: Total Help needed moving to and from a bed to a chair (including a wheelchair)?: Total Help needed standing up from a chair using your arms (e.g., wheelchair or bedside chair)?: Total Help needed to walk in hospital room?: Total Help needed climbing 3-5 steps with a railing? : Total 6 Click Score: 6    End of Session Equipment Utilized During Treatment: Oxygen Activity Tolerance: Patient limited by pain Patient left: in bed;with call bell/phone within reach;with bed alarm set;with nursing/sitter in room Nurse Communication: Other (comment) (refused EOB/OOB) PT Visit Diagnosis: Pain;History of falling (Z91.81);Muscle weakness (generalized) (M62.81);Difficulty in walking, not elsewhere classified (R26.2) Pain - Right/Left: Right Pain - part of body: Hip    Time: 6045-4098 PT Time Calculation (min) (ACUTE ONLY): 18 min   Charges:   PT Evaluation $PT Eval Low Complexity: 1  Low   PT General Charges $$ ACUTE PT VISIT: 1 Visit          Jerolyn Center, PT Acute Rehabilitation Services  Office [phone]   Zena Amos 02/06/2023, 11:06 AM

## 2023-02-06 NOTE — Hospital Course (Addendum)
HPI: Larry [name] is a 72 y.o. male with medical history significant for alcohol abuse, COPD, hypertension, BPH, pneumoconiosis,  tobacco abuse Patient presented to the ED reports of a fall.   Patient was trying to use the ATM  the bank across the street, he got out of his truck, and fell on the street.  He reports dizziness over the past several days present when standing, he reports feeling unwell, with poor oral intake over the past 2 to 3 weeks.  No vomiting.  Reports occasional diarrhea.  Reports chronic difficulty voiding.  No chest pain.  Chronic and unchanged difficulty breathing. Drinks alcohol daily, he drinks about half of 1/5 of vodka.  He reports his last drink was today at about 10 AM.  He reports he drank a small amount, compared to what he normally drinks.  Reports a history of DTs requiring hospitalization.  Significant Events: Admitted 02/04/2023 for right intertrochanteric fracture 02-05-2023 IM nailing right intertrochanteric fracture  Significant Labs: Admitting HgB 11.8 02-06-2023 HgB 7.6 Discharge HgB 8.8  Significant Imaging Studies: Admission right hip XR shows Comminuted, displaced right femoral intratrochanteric fracture   Antibiotic Therapy: Anti-infectives (From admission, onward)    Start     Dose/Rate Route Frequency Ordered Stop   02/06/23 0600  ceFAZolin (ANCEF) IVPB 2g/100 mL premix        2 g 200 mL/hr over 30 Minutes Intravenous On call to O.R. 02/05/23 1243 02/05/23 1510   02/05/23 2200  ceFAZolin (ANCEF) IVPB 2g/100 mL premix        2 g 200 mL/hr over 30 Minutes Intravenous Every 8 hours 02/05/23 1920 02/06/23 2159       Procedures: 02-05-2023 IM nail right intertroch fracture  Consultants: Orthopedic(haddix)

## 2023-02-06 NOTE — Plan of Care (Signed)

## 2023-02-06 NOTE — Evaluation (Signed)
Occupational Therapy Evaluation Patient Details Name: Larry [name] MRN: [id number] DOB: [birth year] Today's Date: 02/06/2023   History of Present Illness 72 y.o. male presented to the ED 02/04/23 with reports of a fall. +comminuted, displaced right femoral intratrochanteric fracture. Head CT swelling Lt frontal scalp. Underwent IM nailing of R hip on 10/14. PMH significant for alcohol abuse, COPD, hypertension, BPH, pneumoconiosis,  tobacco abuse   Clinical Impression   PTA, pt lives alone and reports typically Independent with ADLs, IADLs, driving and mobility without AD. However, pt does endorse some assist needed for IADLs and increasing difficulty with ADLs in past 2-3 weeks d/t dizziness and weakness. Pt presents now with deficits in pain, strength and endurance. Evaluation limited d/t pt reported pain and observed pain anticipation. Overall, pt requires Max A x 2 for rolling side to side and up to Total A x 2 for LB ADLs bed level. Based on current presentation and decreased support at DC, recommend continued inpatient follow up therapy, <3 hours/day in postacute setting.      If plan is discharge home, recommend the following: Two people to help with walking and/or transfers;A lot of help with walking and/or transfers;A lot of help with bathing/dressing/bathroom;Two people to help with bathing/dressing/bathroom    Functional Status Assessment  Patient has had a recent decline in their functional status and demonstrates the ability to make significant improvements in function in a reasonable and predictable amount of time.  Equipment Recommendations  BSC/3in1    Recommendations for Other Services       Precautions / Restrictions Precautions Precautions: Fall Restrictions Weight Bearing Restrictions: Yes RLE Weight Bearing: Weight bearing as tolerated      Mobility Bed Mobility Overal bed mobility: Needs Assistance Bed Mobility: Rolling Rolling: Max assist, +2 for physical  assistance, +2 for safety/equipment         General bed mobility comments: Pt declined EOB attempts. Max A x 2 to scoot up in bed (after much education/encouragement for safe positioning to eat breakfast). Max A x 2 to roll to L to place bed pad under bottom    Transfers                   General transfer comment: pt declined today      Balance                                           ADL either performed or assessed with clinical judgement   ADL Overall ADL's : Needs assistance/impaired Eating/Feeding: Set up;Bed level   Grooming: Set up;Bed level   Upper Body Bathing: Minimal assistance;Bed level   Lower Body Bathing: Total assistance;Bed level;+2 for safety/equipment   Upper Body Dressing : Minimal assistance;Bed level   Lower Body Dressing: Total assistance;+2 for safety/equipment;Bed level       Toileting- Clothing Manipulation and Hygiene: Total assistance;Bed level Toileting - Clothing Manipulation Details (indicate cue type and reason): was using urinal prior to therapy entry, required assist to place lid on urinal, noted some urine on sheets though pt reports no difficulty using urinal       General ADL Comments: Pt very hesitant for movement after surgery, also some concerns regarding confusion with pt at risk for DTs     Vision Baseline Vision/History: 1 Wears glasses Ability to See in Adequate Light: 0 Adequate Patient Visual Report: No change from baseline  Vision Assessment?: No apparent visual deficits     Perception         Praxis         Pertinent Vitals/Pain Pain Assessment Pain Assessment: Faces Faces Pain Scale: Hurts even more Pain Location: R hip Pain Descriptors / Indicators: Grimacing, Guarding Pain Intervention(s): Monitored during session, Limited activity within patient's tolerance, Other (comment) (offered ice though pt declined)     Extremity/Trunk Assessment Upper Extremity Assessment Upper  Extremity Assessment: Generalized weakness;Right hand dominant (tremulous at times)   Lower Extremity Assessment Lower Extremity Assessment: Defer to PT evaluation   Cervical / Trunk Assessment Cervical / Trunk Assessment: Normal   Communication Communication Communication: No apparent difficulties Cueing Techniques: Verbal cues;Gestural cues   Cognition Arousal: Alert Behavior During Therapy: WFL for tasks assessed/performed Overall Cognitive Status: No family/caregiver present to determine baseline cognitive functioning                                 General Comments: potential to be easily agitated, cues/education needed for problem solving and importance of movement after surgery. showing insight into deficits (pain, shakiness and difficulties at home)     General Comments  Wife (separated per pt) called during session    Exercises     Shoulder Instructions      Home Living Family/patient expects to be discharged to:: Private residence Living Arrangements: Spouse/significant other Available Help at Discharge: Neighbor;Available PRN/intermittently Type of Home: Mobile home Home Access: Stairs to enter Entrance Stairs-Number of Steps: 4 Entrance Stairs-Rails: Right;Left Home Layout: One level     Bathroom Shower/Tub: Producer, television/film/video: Standard     Home Equipment: Agricultural consultant (2 wheels)          Prior Functioning/Environment Prior Level of Function : Independent/Modified Independent;Driving             Mobility Comments: no AD normally, reports 2 recent falls at home, has not been feeling well in 2-3 weeks ADLs Comments: Typically indep with ADLs, IADLs but increasing difficulty. has been sponge bathing for 4-5 months due to fear of falling in shower. reports neighbors have been helping with groceries in recent weeks due to not feeling well        OT Problem List: Decreased strength;Decreased activity tolerance;Impaired  balance (sitting and/or standing);Decreased cognition;Decreased safety awareness;Decreased knowledge of use of DME or AE;Pain      OT Treatment/Interventions: Self-care/ADL training;Therapeutic exercise;Energy conservation;DME and/or AE instruction;Therapeutic activities;Patient/family education;Balance training    OT Goals(Current goals can be found in the care plan section) Acute Rehab OT Goals Patient Stated Goal: pain control OT Goal Formulation: With patient Time For Goal Achievement: 02/20/23 Potential to Achieve Goals: Good ADL Goals Pt Will Perform Lower Body Bathing: with mod assist;sit to/from stand Pt Will Perform Lower Body Dressing: with mod assist;sit to/from stand Pt Will Transfer to Toilet: with mod assist;stand pivot transfer;bedside commode  OT Frequency: Min 1X/week    Co-evaluation PT/OT/SLP Co-Evaluation/Treatment: Yes Reason for Co-Treatment: For patient/therapist safety;To address functional/ADL transfers;Complexity of the patient's impairments (multi-system involvement)   OT goals addressed during session: ADL's and self-care      AM-PAC OT "6 Clicks" Daily Activity     Outcome Measure Help from another person eating meals?: A Little Help from another person taking care of personal grooming?: A Little Help from another person toileting, which includes using toliet, bedpan, or urinal?: Total Help from another person bathing (including  washing, rinsing, drying)?: A Lot Help from another person to put on and taking off regular upper body clothing?: A Little Help from another person to put on and taking off regular lower body clothing?: Total 6 Click Score: 13   End of Session Nurse Communication: Mobility status  Activity Tolerance: Patient limited by pain Patient left: in bed;with call bell/phone within reach;with nursing/sitter in room  OT Visit Diagnosis: Unsteadiness on feet (R26.81);Other abnormalities of gait and mobility (R26.89);Muscle weakness  (generalized) (M62.81)                Time: 1308-6578 OT Time Calculation (min): 18 min Charges:  OT General Charges $OT Visit: 1 Visit OT Evaluation $OT Eval Moderate Complexity: 1 Mod  Bradd Canary, OTR/L Acute Rehab Services Office: [phone]   Lorre Munroe 02/06/2023, 8:43 AM

## 2023-02-06 NOTE — Assessment & Plan Note (Addendum)
02-06-2023 drop in HgB from 11.8(admission) to 7.6(today). likely a combo bleeding from initial fracture and recent surgery. No need for transfusion at this point. 02-07-2023 required 1 unit PRBC transfusion  02-13-2023 HgB stable at 8.8 g/dl

## 2023-02-06 NOTE — Assessment & Plan Note (Signed)
See RD note 02-05-2023 Severe Malnutrition related to social / environmental circumstances as evidenced by meal completion < 25%, severe muscle depletion, severe fat depletion.

## 2023-02-06 NOTE — Progress Notes (Signed)
Patient ID: Larry [name], male   DOB: 16-Dec-1950, 72 y.o.   MRN: [id number]   LOS: 2 days   Subjective: Pt somnolent and wouldn't arouse enough to answer questions   Objective: Vital signs in last 24 hours: Temp:  [98 F (36.7 C)-98.4 F (36.9 C)] 98 F (36.7 C) (10/15 0736) Pulse Rate:  [98-108] 108 (10/15 0736) Resp:  [14-22] 16 (10/15 0736) BP: (111-160)/(67-94) 121/87 (10/15 0736) SpO2:  [89 %-98 %] 94 % (10/15 0736) Weight:  [56 kg] 56 kg (10/14 1307) Last BM Date : 02/04/23   Laboratory  CBC Recent Labs    02/05/23 0819 02/05/23 1205 02/05/23 2220 02/06/23 0510  WBC 5.6  --   --  6.0  HGB 8.9*   < > 7.2* 7.6*  HCT 26.7*   < > 21.3* 22.2*  PLT 144*  --   --  142*   < > = values in this interval not displayed.   BMET Recent Labs    02/05/23 0819 02/06/23 0510  NA 136 135  K 4.4 3.9  CL 102 99  CO2 25 26  GLUCOSE 89 141*  BUN 11 17  CREATININE 0.70 0.85  CALCIUM 8.4* 8.4*     Physical Exam General appearance: no distress RLE: Dressings C/D/I   Assessment/Plan: S/p right femur IMN -- Plan PT/OT today with WBAT RLE    Freeman Caldron, PA-C Orthopedic Surgery [phone] 02/06/2023

## 2023-02-06 NOTE — Subjective & Objective (Addendum)
Pt seen and examined. Stable overnight. Awaiting insurance authorization for [facility] in [place], [place].

## 2023-02-07 DIAGNOSIS — S72001A Fracture of unspecified part of neck of right femur, initial encounter for closed fracture: Secondary | ICD-10-CM | POA: Diagnosis not present

## 2023-02-07 LAB — PREPARE RBC (CROSSMATCH)

## 2023-02-07 LAB — HEMOGLOBIN AND HEMATOCRIT, BLOOD
HCT: 21 % — ABNORMAL LOW (ref 39.0–52.0)
HCT: 19 % — ABNORMAL LOW (ref 39.0–52.0)
HCT: 23.4 % — ABNORMAL LOW (ref 39.0–52.0)
Hemoglobin: 7.2 g/dL — ABNORMAL LOW (ref 13.0–17.0)
Hemoglobin: 6.5 g/dL — CL (ref 13.0–17.0)
Hemoglobin: 8.1 g/dL — ABNORMAL LOW (ref 13.0–17.0)

## 2023-02-07 LAB — ABO/RH: ABO/RH(D): O POS

## 2023-02-07 MED ORDER — SODIUM CHLORIDE 0.9% IV SOLUTION: Freq: Once | INTRAVENOUS | Status: AC

## 2023-02-07 MED ORDER — SPIRITUS FRUMENTI
1.0000 | Freq: Three times a day (TID) | ORAL | Status: DC
  Administered 2023-02-07 – 2023-02-09 (×7): 1 via ORAL
  Filled 2023-02-07 (×10): qty 1

## 2023-02-07 NOTE — TOC Initial Note (Addendum)
Transition of Care [facility]) - Initial/Assessment Note    Patient Details  Name: Larry [name] MRN: [id number] Date of Birth: 1950-04-27  Transition of Care [facility]) CM/SW Contact:    Avina Eberle A [place], Theresia Majors Phone Number: 02/07/2023, 2:20 PM  Clinical Narrative:                  CSW met with pt at bedside.   Pt was agreeable to SNF at discharge and told CSW he preferred [facility] for SNF. Bed offers pending.  SNF workup completed. CSW to start authorization once pt is closer to medical stability and SNF has been selected.   TOC will continue to follow.  Expected Discharge Plan: Skilled Nursing Facility Barriers to Discharge: SNF Pending bed offer, Continued Medical Work up, English as a second language teacher   Patient Goals and CMS Choice Patient states their goals for this hospitalization and ongoing recovery are:: Go to [facility]          Expected Discharge Plan and Services       Living arrangements for the past 2 months: Mobile Home                                      Prior Living Arrangements/Services Living arrangements for the past 2 months: Mobile Home Lives with:: Self                Criminal Activity/Legal Involvement Pertinent to Current Situation/Hospitalization: No - Comment as needed  Activities of Daily Living   ADL Screening (condition at time of admission) Independently performs ADLs?: No Does the patient have a NEW difficulty with bathing/dressing/toileting/self-feeding that is expected to last >3 days?: Yes (Initiates electronic notice to provider for possible OT consult) Does the patient have a NEW difficulty with getting in/out of bed, walking, or climbing stairs that is expected to last >3 days?: Yes (Initiates electronic notice to provider for possible PT consult) Does the patient have a NEW difficulty with communication that is expected to last >3 days?: No Is the patient deaf or have difficulty hearing?: No Does the patient have difficulty  seeing, even when wearing glasses/contacts?: Yes Does the patient have difficulty concentrating, remembering, or making decisions?: Yes  Permission Sought/Granted                  Emotional Assessment Appearance:: Appears older than stated age Attitude/Demeanor/Rapport: Lethargic Affect (typically observed): Appropriate Orientation: : Oriented to Self, Oriented to Place, Oriented to Situation Alcohol / Substance Use: Tobacco Use Psych Involvement: No (comment)  Admission diagnosis:  Closed right hip fracture (HCC) [S72.001A] Injury of head, initial encounter [S09.90XA] Closed fracture of right hip, initial encounter (HCC) [S72.001A] Patient Active Problem List   Diagnosis Date Noted   Acute postoperative anemia due to expected blood loss 02/06/2023   Closed right hip fracture (HCC) 02/04/2023   Fall at home, initial encounter 02/04/2023   Hypokalemia 02/04/2023   COPD (chronic obstructive pulmonary disease) (HCC)    Protein-calorie malnutrition, severe (HCC)    BPH (benign prostatic hyperplasia) 11/01/2021   Dysphagia 11/01/2021   Acute respiratory failure with hypoxia and hypercapnia (HCC) 10/24/2021   Possible Aspiration into airway 10/18/2021   Somnolence 10/16/2021   Hypomagnesemia 10/14/2021   Uncontrolled pain 10/13/2021   Failure to thrive in adult 10/13/2021   Lumbar transverse process fracture 10/13/2021   Alcohol abuse 10/13/2021   Severe hepatic steatosis 10/13/2021   Bedbug infestation  10/13/2021   Multiple rib fractures 10/13/2021   Macrocytic anemia 10/13/2021   Hyponatremia 10/13/2021   Alcohol intoxication (HCC) 03/09/2021   Elevated troponin 03/09/2021   Physical deconditioning 03/09/2021   Elevated AST (SGOT) 03/09/2021   Thrombocytopenia (HCC) 03/09/2021   Elevated MCV 03/09/2021   High anion gap metabolic acidosis 03/09/2021   Dehydration 03/09/2021   Pneumoconiosis, coal, 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   Eye discharge 03/09/2021   Falls  frequently 02/06/2021   Hypertension 02/06/2021   Proximal humerus fracture 09/24/2014   PCP:  Benita Stabile, MD Pharmacy:   [facility] [place], [place] - [address] [place] [address] Phone: [phone] Fax: [phone]     Social Determinants of Health (SDOH) Social History: SDOH Screenings   Food Insecurity: Food Insecurity Present (02/04/2023)  Housing: Medium Risk (02/04/2023)  Transportation Needs: Unmet Transportation Needs (02/04/2023)  Utilities: At Risk (02/04/2023)  Tobacco Use: High Risk (02/05/2023)   SDOH Interventions:     Readmission Risk Interventions    12/12/2021   11:03 AM 03/10/2021    2:14 PM 02/11/2021    1:11 PM  Readmission Risk Prevention Plan  Medication Screening  Complete   Transportation Screening Complete Complete Complete  PCP or Specialist Appt within 3-5 Days Not Complete    Home Care Screening   Complete  Medication Review (RN CM)   Complete  HRI or Home Care Consult Complete    Social Work Consult for Recovery Care Planning/Counseling Complete    Palliative Care Screening Not Applicable    Medication Review Oceanographer) Complete

## 2023-02-07 NOTE — Progress Notes (Signed)
Ortho Trauma Note  Patient having pain which is limited his ability to mobilize with therapy.  States that his blood pressure runs low as well as his blood numbers.  Denies any other issues this morning.  Physical exam: Leg is appropriately swollen.  Dressings are clean dry and intact had not changed them this morning.  He has intact dorsiflexion plantarflexion he has sensation intact to light touch.  Results for orders placed or performed during the hospital encounter of 02/04/23 (from the past 24 hour(s))  Hemoglobin and hematocrit, blood     Status: Abnormal   Collection Time: 02/06/23 11:19 AM  Result Value Ref Range   Hemoglobin 7.0 (L) 13.0 - 17.0 g/dL   HCT 60.4 (L) 54.0 - 98.1 %  Hemoglobin and hematocrit, blood     Status: Abnormal   Collection Time: 02/06/23  7:47 PM  Result Value Ref Range   Hemoglobin 7.1 (L) 13.0 - 17.0 g/dL   HCT 19.1 (L) 47.8 - 29.5 %  Hemoglobin and hematocrit, blood     Status: Abnormal   Collection Time: 02/07/23  5:29 AM  Result Value Ref Range   Hemoglobin 7.2 (L) 13.0 - 17.0 g/dL   HCT 62.1 (L) 30.8 - 65.7 %     A/P 72 year old male status post cephalomedullary nailing for right intertrochanteric femur fracture.  Weightbearing as tolerated right lower extremity Range of motion as tolerated PT OT likely SNF for disposition Monitor hemoglobin.  Currently low sevens which has been stable over the last few days. Okay to reinforce dressings as needed.  Okay to shower with removal of dressings. Follow-up with me in approximately 3 weeks for repeat x-rays of the right hip.  Roby Lofts, MD [facility] [phone] (office) [email or web address]

## 2023-02-07 NOTE — Plan of Care (Signed)

## 2023-02-07 NOTE — [place] FL2 (Signed)
[place] MEDICAID FL2 LEVEL OF CARE FORM     IDENTIFICATION  Patient Name: Larry [name] Birthdate: Sep 12, 1950 Sex: male Admission Date (Current Location): 02/04/2023  [facility] and [place] Number:  [employer] and Address:  [facility] [place], [address] [place], [place] [address]      Provider Number: [id number]  Attending Physician Name and Address:  Kathrynn Running, MD  Relative Name and Phone Number:  Verrington,Christine Clearence Cheek)  [phone]    Current Level of Care: SNF Recommended Level of Care: Skilled Nursing Facility Prior Approval Number:    Date Approved/Denied:   PASRR Number: [id number]  Discharge Plan: SNF    Current Diagnoses: Patient Active Problem List   Diagnosis Date Noted   Acute postoperative anemia due to expected blood loss 02/06/2023   Closed right hip fracture (HCC) 02/04/2023   Fall at home, initial encounter 02/04/2023   Hypokalemia 02/04/2023   COPD (chronic obstructive pulmonary disease) (HCC)    Protein-calorie malnutrition, severe (HCC)    BPH (benign prostatic hyperplasia) 11/01/2021   Dysphagia 11/01/2021   Acute respiratory failure with hypoxia and hypercapnia (HCC) 10/24/2021   Possible Aspiration into airway 10/18/2021   Somnolence 10/16/2021   Hypomagnesemia 10/14/2021   Uncontrolled pain 10/13/2021   Failure to thrive in adult 10/13/2021   Lumbar transverse process fracture 10/13/2021   Alcohol abuse 10/13/2021   Severe hepatic steatosis 10/13/2021   Bedbug infestation 10/13/2021   Multiple rib fractures 10/13/2021   Macrocytic anemia 10/13/2021   Hyponatremia 10/13/2021   Alcohol intoxication (HCC) 03/09/2021   Elevated troponin 03/09/2021   Physical deconditioning 03/09/2021   Elevated AST (SGOT) 03/09/2021   Thrombocytopenia (HCC) 03/09/2021   Elevated MCV 03/09/2021   High anion gap metabolic acidosis 03/09/2021   Dehydration 03/09/2021   Pneumoconiosis, coal, workers'  (HCC) 03/09/2021   Eye discharge 03/09/2021   Falls frequently 02/06/2021   Hypertension 02/06/2021   Proximal humerus fracture 09/24/2014    Orientation RESPIRATION BLADDER Height & Weight     Self, Time, Situation, Place  O2 (2L) Incontinent Weight: 123 lb 7.3 oz (56 kg) Height:  6\' 2"  (188 cm)  BEHAVIORAL SYMPTOMS/MOOD NEUROLOGICAL BOWEL NUTRITION STATUS      Incontinent Diet (see discharge summary)  AMBULATORY STATUS COMMUNICATION OF NEEDS Skin   Extensive Assist Verbally Surgical wounds (Incision (Closed) 02/05/23 Hip Right. Wound / Incision (Open or Dehisced) 02/04/23 Skin tear Elbow Posterior;Right)                       Personal Care Assistance Level of Assistance  Bathing, Feeding, Dressing Bathing Assistance: Maximum assistance Feeding assistance: Limited assistance Dressing Assistance: Maximum assistance     Functional Limitations Info  Sight, Hearing, Speech Sight Info: Impaired (wears glasses) Hearing Info: Impaired Speech Info: Adequate    SPECIAL CARE FACTORS FREQUENCY  PT (By licensed PT), OT (By licensed OT)     PT Frequency: 5x/week OT Frequency: 5x/week            Contractures Contractures Info: Not present    Additional Factors Info  Code Status, Allergies Code Status Info: FULL Allergies Info: No Known Allergies           Current Medications (02/07/2023):  This is the current hospital active medication list Current Facility-Administered Medications  Medication Dose Route Frequency Provider Last Rate Last Admin   0.9 %  sodium chloride infusion (Manually program via Guardrails IV Fluids)   Intravenous Once [employer], [employer]  Perrin Maltese, MD       amLODipine (NORVASC) tablet 5 mg  5 mg Oral Daily Haddix, Gillie Manners, MD   5 mg at 02/07/23 0924   enoxaparin (LOVENOX) injection 40 mg  40 mg Subcutaneous Q24H Haddix, Gillie Manners, MD   40 mg at 02/07/23 4696   folic acid (FOLVITE) tablet 1 mg  1 mg Oral Daily Haddix, Gillie Manners, MD   1 mg at 02/07/23 2952    HYDROcodone-acetaminophen (NORCO/VICODIN) 5-325 MG per tablet 1 tablet  1 tablet Oral Q6H PRN Carollee Herter, DO       LORazepam (ATIVAN) tablet 1-4 mg  1-4 mg Oral Q1H PRN Haddix, Gillie Manners, MD   1 mg at 02/05/23 8413   methocarbamol (ROBAXIN) tablet 500 mg  500 mg Oral Q6H PRN Haddix, Gillie Manners, MD       Or   methocarbamol (ROBAXIN) 500 mg in dextrose 5 % 50 mL IVPB  500 mg Intravenous Q6H PRN Haddix, Gillie Manners, MD       multivitamin with minerals tablet 1 tablet  1 tablet Oral Daily Haddix, Gillie Manners, MD   1 tablet at 02/07/23 2440   nicotine (NICODERM CQ - dosed in mg/24 hr) patch 7 mg  7 mg Transdermal Daily Haddix, Gillie Manners, MD   7 mg at 02/07/23 1027   polyethylene glycol (MIRALAX / GLYCOLAX) packet 17 g  17 g Oral Daily PRN Haddix, Gillie Manners, MD       spiritus frumenti (ethyl alcohol) solution 1 each  1 each Oral TID AC Wouk, Wilfred Curtis, MD       thiamine (VITAMIN B1) tablet 100 mg  100 mg Oral Daily Haddix, Gillie Manners, MD   100 mg at 02/07/23 2536   Or   thiamine (VITAMIN B1) injection 100 mg  100 mg Intravenous Daily Haddix, Gillie Manners, MD   100 mg at 02/04/23 1513     Discharge Medications: Please see discharge summary for a list of discharge medications.  Relevant Imaging Results:  Relevant Lab Results:   Additional Information SSN: [id number]  Dsean Vantol A [place], [place]

## 2023-02-07 NOTE — Progress Notes (Addendum)
PROGRESS NOTE    ISSAIC WELLIVER  ZOX:[id number] DOB: [birth year] DOA: 02/04/2023 PCP: Benita Stabile, MD  Subjective: Larry [name] is a 72 y.o. male with medical history significant for alcohol abuse, COPD, hypertension, BPH, pneumoconiosis,  tobacco abuse Patient presented to the ED reports of a fall.   Patient was trying to use the ATM  the bank across the street, he got out of his truck, and fell on the street.  He reports dizziness over the past several days present when standing, he reports feeling unwell, with poor oral intake over the past 2 to 3 weeks.  No vomiting.  Reports occasional diarrhea.  Reports chronic difficulty voiding.  No chest pain.  Chronic and unchanged difficulty breathing. Drinks alcohol daily, he drinks about half of 1/5 of vodka.  He reports his last drink was today at about 10 AM.  He reports he drank a small amount, compared to what he normally drinks.  Reports a history of DTs requiring hospitalization.   Significant Events: Admitted 02/04/2023 for right intertrochanteric fracture 02-05-2023 IM nailing right intertrochanteric fracture 10/16 hgb drifted to 6.5, 1 unit blood   Procedures: 02-05-2023 IM nail right intertroch fracture  Consultants: Orthopedic([facility]ix)   Assessment and Plan: * Closed right hip fracture (HCC) 02-04-2023 S/p Fall.  Pelvic x-ray - comminuted, displaced right femoral intratrochanteric fracture.  02-05-2023 had IM nailing of right femur 02-06-2023 will need SNF placement. 10/16: stable pending snf placement  Fall at home, initial encounter 02-04-2023 Reports orthostatic dizziness with resultant fall and subsequent hip fracture.  Head CT-soft tissue swelling left frontal scalp. Fall in the setting of chronic alcohol abuse.  Ct head, cxr, hip x-ray, x-ray right elbow and shoulder  02-06-2023. Pt now is s/p IM nail to right femur for intertrochanteric fracture.    Right humeral head dislocation Seen on x-ray, per secure chat w/  Dr. Jena Gauss of ortho on 10/16: "Looks chronic, appears the same as from previous chest x-rays from last year. Can address on outpatient basis if he's reliable in follow up will likely need a total shoulder replacement but may not be a candidate with medical comorbidities and compliance."  Alcohol abuse Drinks half of 1/5 of vodka daily.  Last alcoholic beverage was the morning of arrival. Blood alcohol level unremarkable.  Prior hospitalization for alcohol withdrawal. Not currently withdrawing - start vodka with meals, this should hopefully prevent withdrawal and its dangers - continue vitamins - f/u hcv  Hypokalemia 02-04-2023 Mild hypokalemia. Potassium 3.3. 02-06-2023 resolved after replacement  Protein-calorie malnutrition, severe (HCC) See RD note 02-05-2023 Severe Malnutrition related to social / environmental circumstances as evidenced by meal completion < 25%, severe muscle depletion, severe fat depletion.   COPD (chronic obstructive pulmonary disease) (HCC) History of COPD and pneumoconiosis.  Chest x-ray clear. Not On home O2. Here on 2 liters, nothing acute on cxr - wean as able  BPH (benign prostatic hyperplasia) Stable.  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 BMI 16. -Nuttrition consult says he has severe protein calorie malnutrition.  Hypertension Stable. -Resume Norvasc 02-06-2023 BP stable  Acute postoperative anemia due to expected blood loss 02-06-2023 drop in HgB from 11.8(admission) to 6.5, ortho no concerns for ongoing blood loss - 1 unit prbc, patient consented    DVT prophylaxis: enoxaparin (LOVENOX) injection 40 mg Start: 02/06/23 0900 SCDs Start: 02/04/23 2154    Code Status: Full Code Family Communication: no family at bedside Disposition Plan: SNF Reason for continuing need for hospitalization: needs SNF placement.  Objective: Vitals:   02/06/23 1749 02/06/23 1955 02/07/23 0750 02/07/23 1157  BP: 129/73 (!) 117/59 (!) 115/57 (!) 123/55  Pulse:  (!) 104 98 (!) 52 (!) 54  Resp:   16 17  Temp:  97.7 F (36.5 C) 99.2 F (37.3 C) 98.2 F (36.8 C)  TempSrc:  Oral Oral Oral  SpO2: 100% 100% 99% 95%  Weight:      Height:        Intake/Output Summary (Last 24 hours) at 02/07/2023 1247 Last data filed at 02/07/2023 0500 Gross per 24 hour  Intake 218 ml  Output --  Net 218 ml   Filed Weights   02/04/23 1409 02/05/23 1307  Weight: 56.7 kg 56 kg    Examination: Malnourished, NAD CTAB save for rales at bases RRR Abdomen soft, non-distended Dressing right hip c/d/I, mild swelling Warm extremities, no edema Distal sensation intact lower extremities  Data Reviewed: I have personally reviewed following labs and imaging studies  CBC: Recent Labs  Lab 02/04/23 1455 02/05/23 0819 02/05/23 1205 02/06/23 0510 02/06/23 1119 02/06/23 1947 02/07/23 0529 02/07/23 1134  WBC 3.7* 5.6  --  6.0  --   --   --   --   NEUTROABS 2.1  --   --   --   --   --   --   --   HGB 11.8* 8.9*   < > 7.6* 7.0* 7.1* 7.2* 6.5*  HCT 33.8* 26.7*   < > 22.2* 20.8* 20.6* 21.0* 19.0*  MCV 100.6* 104.7*  --  104.2*  --   --   --   --   PLT 159 144*  --  142*  --   --   --   --    < > = values in this interval not displayed.   Basic Metabolic Panel: Recent Labs  Lab 02/04/23 1455 02/04/23 2237 02/05/23 0819 02/06/23 0510  NA 133*  --  136 135  K 3.3*  --  4.4 3.9  CL 95*  --  102 99  CO2 28  --  25 26  GLUCOSE 107*  --  89 141*  BUN 10  --  11 17  CREATININE 0.64  --  0.70 0.85  CALCIUM 8.7*  --  8.4* 8.4*  MG  --  1.6* 1.6* 1.9  PHOS  --  3.6 3.9 3.4   GFR: Estimated Creatinine Clearance: 62.2 mL/min (by C-G formula based on SCr of 0.85 mg/dL). Liver Function Tests: Recent Labs  Lab 02/04/23 1455 02/05/23 0819 02/06/23 0510  AST 78* 34 80*  ALT 34 23 23  ALKPHOS 86 67 95  BILITOT 1.3* 1.1 0.6  PROT 6.3* 5.2* 4.9*  ALBUMIN 3.8 3.0* 2.7*   Coagulation Profile: Recent Labs  Lab 02/04/23 1515  INR 1.0    Recent Results  (from the past 240 hour(s))  Surgical pcr screen     Status: None   Collection Time: 02/05/23 10:09 AM   Specimen: Nasal Mucosa; Nasal Swab  Result Value Ref Range Status   MRSA, PCR NEGATIVE NEGATIVE Final   Staphylococcus aureus NEGATIVE NEGATIVE Final    Comment: (NOTE) The Xpert SA Assay (FDA approved for NASAL specimens in patients 67 years of age and older), is one component of a comprehensive surveillance program. It is not intended to diagnose infection nor to guide or monitor treatment. Performed at [facility], [address], [place], [place] [address]      Radiology Studies: DG HIP  UNILAT WITH PELVIS 1V RIGHT  Result Date: 02/05/2023 CLINICAL DATA:  Postop. EXAM: DG HIP (WITH OR WITHOUT PELVIS) 1V RIGHT COMPARISON:  Preoperative imaging. FINDINGS: Femoral intramedullary nail with trans trochanteric and distal locking screw fixation traverse intertrochanteric femur fracture. Improved fracture alignment from preoperative imaging. There is persistent displacement of the lesser and greater trochanters. Recent postsurgical change includes air and edema in the soft tissues. IMPRESSION: Status post ORIF of intertrochanteric femur fracture. No immediate postoperative complication. Electronically Signed   By: Narda Rutherford M.D.   On: 02/05/2023 17:46   DG FEMUR, MIN 2 VIEWS RIGHT  Result Date: 02/05/2023 CLINICAL DATA:  Elective surgery. EXAM: RIGHT FEMUR 2 VIEWS COMPARISON:  Preoperative imaging. FINDINGS: Five fluoroscopic spot views of the right proximal femur obtained in the operating room. Femoral intramedullary nail with trans trochanteric and distal locking screw fixation traverse intertrochanteric femur fracture. Fluoroscopy time 1 minutes 2 seconds. Dose 6.08 mGy. IMPRESSION: Intraoperative fluoroscopy during ORIF of intertrochanteric femur fracture. Electronically Signed   By: Narda Rutherford M.D.   On: 02/05/2023 17:46    Scheduled Meds:  sodium chloride    Intravenous Once   amLODipine  5 mg Oral Daily   enoxaparin (LOVENOX) injection  40 mg Subcutaneous Q24H   folic acid  1 mg Oral Daily   multivitamin with minerals  1 tablet Oral Daily   nicotine  7 mg Transdermal Daily   spiritus frumenti  1 each Oral TID AC   thiamine  100 mg Oral Daily   Or   thiamine  100 mg Intravenous Daily   Continuous Infusions:  methocarbamol (ROBAXIN) IV       LOS: 3 days   Time spent: 35 minutes  Silvano Bilis, MD  [facility]  02/07/2023, 12:47 PM

## 2023-02-07 NOTE — Progress Notes (Signed)
Notified that patient hemoglobin is 6.5.  MD notified.

## 2023-02-08 DIAGNOSIS — Y92009 Unspecified place in unspecified non-institutional (private) residence as the place of occurrence of the external cause: Secondary | ICD-10-CM | POA: Diagnosis not present

## 2023-02-08 DIAGNOSIS — D62 Acute posthemorrhagic anemia: Secondary | ICD-10-CM | POA: Diagnosis not present

## 2023-02-08 DIAGNOSIS — W19XXXA Unspecified fall, initial encounter: Secondary | ICD-10-CM | POA: Diagnosis not present

## 2023-02-08 DIAGNOSIS — E43 Unspecified severe protein-calorie malnutrition: Secondary | ICD-10-CM | POA: Diagnosis not present

## 2023-02-08 LAB — CBC
HCT: 23.1 % — ABNORMAL LOW (ref 39.0–52.0)
Hemoglobin: 8 g/dL — ABNORMAL LOW (ref 13.0–17.0)
MCH: 33.8 pg (ref 26.0–34.0)
MCHC: 34.6 g/dL (ref 30.0–36.0)
MCV: 97.5 fL (ref 80.0–100.0)
Platelets: 171 10*3/uL (ref 150–400)
RBC: 2.37 MIL/uL — ABNORMAL LOW (ref 4.22–5.81)
RDW: 15.2 % (ref 11.5–15.5)
WBC: 6.1 10*3/uL (ref 4.0–10.5)
nRBC: 0 % (ref 0.0–0.2)

## 2023-02-08 LAB — BPAM RBC
Blood Product Expiration Date: 202411112359
ISSUE DATE / TIME: 202410161416
Unit Type and Rh: 5100

## 2023-02-08 LAB — BASIC METABOLIC PANEL
Anion gap: 9 (ref 5–15)
BUN: 8 mg/dL (ref 8–23)
CO2: 27 mmol/L (ref 22–32)
Calcium: 8.5 mg/dL — ABNORMAL LOW (ref 8.9–10.3)
Chloride: 98 mmol/L (ref 98–111)
Creatinine, Ser: 0.52 mg/dL — ABNORMAL LOW (ref 0.61–1.24)
GFR, Estimated: >60 mL/min (ref >60)
Glucose, Bld: 110 mg/dL — ABNORMAL HIGH (ref 70–99)
Potassium: 3.8 mmol/L (ref 3.5–5.1)
Sodium: 134 mmol/L — ABNORMAL LOW (ref 135–145)

## 2023-02-08 LAB — TYPE AND SCREEN
ABO/RH(D): O POS
Antibody Screen: NEGATIVE
Unit division: 0

## 2023-02-08 LAB — HEMOGLOBIN AND HEMATOCRIT, BLOOD
HCT: 22.4 % — ABNORMAL LOW (ref 39.0–52.0)
HCT: 23.3 % — ABNORMAL LOW (ref 39.0–52.0)
Hemoglobin: 7.7 g/dL — ABNORMAL LOW (ref 13.0–17.0)
Hemoglobin: 8 g/dL — ABNORMAL LOW (ref 13.0–17.0)

## 2023-02-08 NOTE — Progress Notes (Signed)
PROGRESS NOTE    Larry [name]  WUJ:[id number] DOB: 09-27-1950 DOA: 02/04/2023 PCP: Benita Stabile, MD    Brief Narrative:  Larry [name] is a 72 y.o. male with medical history significant for alcohol abuse, COPD, hypertension, BPH, pneumoconiosis,  tobacco abuse Patient presented to the ED reports of a fall.   Patient was trying to use the ATM at the bank across the street, he got out of his truck, and fell on the street.  He reported dizziness over the past several days present when standing, Drinks alcohol daily, he drinks about half of 1/5 of vodka with last drink of alcohol in the morning of fall. history of DTs requiring hospitalization.   Significant Events: Admitted 02/04/2023 for right intertrochanteric fracture 02-05-2023 IM nailing right intertrochanteric fracture 10/16 hgb drifted to 6.5, 1 unit blood with appropriate improvement.  Subjective:  Patient seen and examined.  Pain is managed, however he is very apprehensive about stepping out of the bed.  Patient knows that he has to go to rehab. Patient is started on therapeutic alcohol with meals, will need to find out that sniffs can give him alcohol through pharmacy.  Assessment & Plan:   * Closed right hip fracture (HCC) 02-04-2023 S/p Fall.  Pelvic x-ray - comminuted, displaced right femoral intratrochanteric fracture.  02-05-2023 had IM nailing of right femur Pain managed with oral pain medications.  Laxatives and stool softeners on board.  Continue to work with PT OT.  Refer to a SNF for rehab.   Fall at home, initial encounter 02-04-2023 Reports orthostatic dizziness with resultant fall and subsequent hip fracture.  Head CT-soft tissue swelling left frontal scalp. Fall in the setting of chronic alcohol abuse.  Ct head, cxr, hip x-ray, x-ray right elbow and shoulder.  02-06-2023. Pt now is s/p IM nail to right femur for intertrochanteric fracture.     Right humeral head dislocation Chronic.  Not a candidate for  surgery.     Alcohol abuse with risk of alcohol withdrawal. Drinks half of 1/5 of vodka daily.  Last alcoholic beverage was the morning of arrival. Blood alcohol level unremarkable.  Prior hospitalization for alcohol withdrawal. Not currently withdrawing - started on vodka with meals, this should hopefully prevent withdrawal and its dangers.  We will need to make sure that SNF can administer it. - continue vitamins   Hypokalemia Replaced and improved.   Protein-calorie malnutrition, severe (HCC) See RD note 02-05-2023 Severe Malnutrition related to social / environmental circumstances as evidenced by meal completion < 25%, severe muscle depletion, severe fat depletion.  Nutritional supplements.   COPD (chronic obstructive pulmonary disease) (HCC) Stable.  Bronchodilator as needed.   BPH (benign prostatic hyperplasia) Stable.   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 BMI 16. -Nuttrition augmentation.   Hypertension Stable. -Resume Norvasc.  No orthostatics today.   Acute postoperative anemia due to expected blood loss 02-06-2023 drop in HgB from 11.8(admission) to 6.5, ortho no concerns for ongoing blood loss - 1 unit prbc given 10/16.  Appropriately responded.  Medically stable to transfer to SNF.   DVT prophylaxis: enoxaparin (LOVENOX) injection 40 mg Start: 02/06/23 0900 SCDs Start: 02/04/23 2154   Code Status: Full code Family Communication: None at the bedside Disposition Plan: Status is: Inpatient Remains inpatient appropriate because: Waiting for SNF.     Consultants:  Orthopedics  Procedures:  None  Antimicrobials:  None     Objective: Vitals:   02/07/23 1736 02/07/23 2015 02/08/23 0440 02/08/23 0745  BP:  133/63 123/72 115/66 139/72  Pulse: (!) 110 (!) 109 (!) 111 (!) 53  Resp: 17 18 18 16   Temp: 98.1 F (36.7 C) 98.6 F (37 C) 98.7 F (37.1 C) 99.1 F (37.3 C)  TempSrc: Oral Oral Oral Oral  SpO2: 97% 95% 95% 95%  Weight:      Height:         Intake/Output Summary (Last 24 hours) at 02/08/2023 1107 Last data filed at 02/08/2023 0859 Gross per 24 hour  Intake 1164.93 ml  Output 300 ml  Net 864.93 ml   Filed Weights   02/04/23 1409 02/05/23 1307  Weight: 56.7 kg 56 kg    Examination:  General: Looks fairly comfortable.  He is cachectic and thinly built.  He is on room air.  Not in any distress.  Anxious about mobility. Cardiovascular: S1-S2 normal.  Regular rate rhythm. Respiratory: Bilateral clear.  No added sounds. Gastrointestinal: Soft.  Nontender.  Bowel sound present. Ext: Right lateral hip incisions clean and dry.  Mild swelling and tenderness appropriate for postop findings. Neuro: Intact.      Data Reviewed: I have personally reviewed following labs and imaging studies  CBC: Recent Labs  Lab 02/04/23 1455 02/05/23 0819 02/05/23 1205 02/06/23 0510 02/06/23 1119 02/06/23 1947 02/07/23 0529 02/07/23 1134 02/07/23 2124 02/08/23 0554  WBC 3.7* 5.6  --  6.0  --   --   --   --   --  6.1  NEUTROABS 2.1  --   --   --   --   --   --   --   --   --   HGB 11.8* 8.9*   < > 7.6*   < > 7.1* 7.2* 6.5* 8.1* 8.0*  HCT 33.8* 26.7*   < > 22.2*   < > 20.6* 21.0* 19.0* 23.4* 23.1*  MCV 100.6* 104.7*  --  104.2*  --   --   --   --   --  97.5  PLT 159 144*  --  142*  --   --   --   --   --  171   < > = values in this interval not displayed.   Basic Metabolic Panel: Recent Labs  Lab 02/04/23 1455 02/04/23 2237 02/05/23 0819 02/06/23 0510 02/08/23 0554  NA 133*  --  136 135 134*  K 3.3*  --  4.4 3.9 3.8  CL 95*  --  102 99 98  CO2 28  --  25 26 27   GLUCOSE 107*  --  89 141* 110*  BUN 10  --  11 17 8   CREATININE 0.64  --  0.70 0.85 0.52*  CALCIUM 8.7*  --  8.4* 8.4* 8.5*  MG  --  1.6* 1.6* 1.9  --   PHOS  --  3.6 3.9 3.4  --    GFR: Estimated Creatinine Clearance: 66.1 mL/min (A) (by C-G formula based on SCr of 0.52 mg/dL (L)). Liver Function Tests: Recent Labs  Lab 02/04/23 1455 02/05/23 0819  02/06/23 0510  AST 78* 34 80*  ALT 34 23 23  ALKPHOS 86 67 95  BILITOT 1.3* 1.1 0.6  PROT 6.3* 5.2* 4.9*  ALBUMIN 3.8 3.0* 2.7*   No results for input(s): "LIPASE", "AMYLASE" in the last 168 hours. No results for input(s): "AMMONIA" in the last 168 hours. Coagulation Profile: Recent Labs  Lab 02/04/23 1515  INR 1.0   Cardiac Enzymes: No results for input(s): "CKTOTAL", "CKMB", "CKMBINDEX", "TROPONINI" in the last  168 hours. BNP (last 3 results) No results for input(s): "PROBNP" in the last 8760 hours. HbA1C: No results for input(s): "HGBA1C" in the last 72 hours. CBG: No results for input(s): "GLUCAP" in the last 168 hours. Lipid Profile: No results for input(s): "CHOL", "HDL", "LDLCALC", "TRIG", "CHOLHDL", "LDLDIRECT" in the last 72 hours. Thyroid Function Tests: No results for input(s): "TSH", "T4TOTAL", "FREET4", "T3FREE", "THYROIDAB" in the last 72 hours. Anemia Panel: No results for input(s): "VITAMINB12", "FOLATE", "FERRITIN", "TIBC", "IRON", "RETICCTPCT" in the last 72 hours. Sepsis Labs: No results for input(s): "PROCALCITON", "LATICACIDVEN" in the last 168 hours.  Recent Results (from the past 240 hour(s))  Surgical pcr screen     Status: None   Collection Time: 02/05/23 10:09 AM   Specimen: Nasal Mucosa; Nasal Swab  Result Value Ref Range Status   MRSA, PCR NEGATIVE NEGATIVE Final   Staphylococcus aureus NEGATIVE NEGATIVE Final    Comment: (NOTE) The Xpert SA Assay (FDA approved for NASAL specimens in patients 66 years of age and older), is one component of a comprehensive surveillance program. It is not intended to diagnose infection nor to guide or monitor treatment. Performed at [facility], [address] [place], [place] [address]          Radiology Studies: No results found.      Scheduled Meds:  amLODipine  5 mg Oral Daily   enoxaparin (LOVENOX) injection  40 mg Subcutaneous Q24H   folic acid  1 mg Oral Daily   multivitamin  with minerals  1 tablet Oral Daily   nicotine  7 mg Transdermal Daily   spiritus frumenti  1 each Oral TID AC   thiamine  100 mg Oral Daily   Or   thiamine  100 mg Intravenous Daily   Continuous Infusions:  methocarbamol (ROBAXIN) IV       LOS: 4 days    Time spent: 35 minutes    Dorcas Carrow, MD [facility]

## 2023-02-08 NOTE — Progress Notes (Signed)
Physical Therapy Treatment Patient Details Name: Larry [name] MRN: [id number] DOB: 22-Apr-1951 Today's Date: 02/08/2023   History of Present Illness 72 y.o. male presented to the ED 02/04/23 with reports of a fall. +comminuted, displaced right femoral intratrochanteric fracture. Head CT swelling Lt frontal scalp. Underwent IM nailing of R hip on 10/14. PMH significant for alcohol abuse, COPD, hypertension, BPH, pneumoconiosis,  tobacco abuse    PT Comments  Pt greeted resting in bed and agreeable to session with slow progress towards acute goals as pt continues to be limited by pain and anxiousness in anticipation of pain. Pt requiring mod A to come to sitting EOB with pt demonstrating strong L lateral lean to off weight R hip and unable to come to full upright sitting with pt quickly returning to supine with CGA. Pt somewhat impulsive and quick with movements due to pain, despite education and dense cues for sequencing. Pt performing LE therex with fair tolerance. Educated pt on importance of frequent and aggressive mobility with pt verbalizing understanding. Pt continues to benefit from skilled PT services to progress toward functional mobility goals.      If plan is discharge home, recommend the following: Other (comment) (currently not appropriate for home)   Can travel by private vehicle     No  Equipment Recommendations  None recommended by PT    Recommendations for Other Services       Precautions / Restrictions Precautions Precautions: Fall Restrictions Weight Bearing Restrictions: Yes RLE Weight Bearing: Weight bearing as tolerated     Mobility  Bed Mobility Overal bed mobility: Needs Assistance Bed Mobility: Supine to Sit, Sit to Supine     Supine to sit: Mod assist Sit to supine: Contact guard assist   General bed mobility comments: mod A to manage RLE to and off EOB, pt unable to tolerate coming to dull upright sitting due to hip pain, pt returning to supine with  CGA, willing to attempt x2    Transfers                   General transfer comment: pt declined today    Ambulation/Gait                   Stairs             Wheelchair Mobility     Tilt Bed    Modified Rankin (Stroke Patients Only)       Balance Overall balance assessment: Needs assistance Sitting-balance support: Bilateral upper extremity supported Sitting balance-Leahy Scale: Zero Sitting balance - Comments: unable to come to full upright sitting due to pain Postural control: Left lateral lean                                  Cognition Arousal: Alert Behavior During Therapy: WFL for tasks assessed/performed Overall Cognitive Status: No family/caregiver present to determine baseline cognitive functioning                                 General Comments: potential to be easily agitated, cues/education needed for problem solving and importance of movement after surgery. showing insight into deficits but decreased insight into importance of mobility        Exercises General Exercises - Lower Extremity Ankle Circles/Pumps: AROM, 5 reps Quad Sets: AROM, Right, 10 reps Heel Slides: AROM, Right, 10  reps, Supine Hip ABduction/ADduction: AAROM, Right, 10 reps, Supine Straight Leg Raises: AAROM, Right, 5 reps, Supine    General Comments General comments (skin integrity, edema, etc.): VSS on RA      Pertinent Vitals/Pain Pain Assessment Pain Assessment: Faces Faces Pain Scale: Hurts whole lot Pain Location: R hip Pain Descriptors / Indicators: Grimacing, Guarding Pain Intervention(s): Monitored during session, Limited activity within patient's tolerance    Home Living                          Prior Function            PT Goals (current goals can now be found in the care plan section) Acute Rehab PT Goals Patient Stated Goal: to minimize pain PT Goal Formulation: With patient Time For Goal  Achievement: 02/20/23 Progress towards PT goals: Progressing toward goals    Frequency    Min 1X/week      PT Plan      Co-evaluation              AM-PAC PT "6 Clicks" Mobility   Outcome Measure  Help needed turning from your back to your side while in a flat bed without using bedrails?: Total Help needed moving from lying on your back to sitting on the side of a flat bed without using bedrails?: Total Help needed moving to and from a bed to a chair (including a wheelchair)?: Total Help needed standing up from a chair using your arms (e.g., wheelchair or bedside chair)?: Total Help needed to walk in hospital room?: Total Help needed climbing 3-5 steps with a railing? : Total 6 Click Score: 6    End of Session   Activity Tolerance: Patient limited by pain Patient left: in bed;with call bell/phone within reach;with bed alarm set Nurse Communication: Mobility status PT Visit Diagnosis: Pain;History of falling (Z91.81);Muscle weakness (generalized) (M62.81);Difficulty in walking, not elsewhere classified (R26.2) Pain - Right/Left: Right Pain - part of body: Hip     Time: 7829-5621 PT Time Calculation (min) (ACUTE ONLY): 17 min  Charges:    $Therapeutic Activity: 8-22 mins PT General Charges $$ ACUTE PT VISIT: 1 Visit                     Raul Winterhalter R. PTA Acute Rehabilitation Services Office: [phone]   Catalina Antigua 02/08/2023, 9:47 AM

## 2023-02-08 NOTE — TOC Progression Note (Addendum)
Transition of Care [facility]) - Progression Note    Patient Details  Name: MARTHA SOLTYS MRN: [id number] Date of Birth: 1951-03-12  Transition of Care [facility]) CM/SW Contact  Elridge Stemm A [place], [place] Phone Number: 02/08/2023, 10:14 AM  Clinical Narrative:     Update 1442 CSW met with pt at bedside to follow up regarding bed offers. He said that he was too unfamiliar with [facility] and did not want to go to SNF in this area if possible. [facility] are bed offers were declined. He said his wife lives in [place] area, CSW resent referrals further out in [place], [place] and [place]. Bed offers pending.    CSW met with pt at bedside to provide bed offers. He stated he would look over list make a decision on a facility choice today.   TOC will continue to follow.    Expected Discharge Plan: Skilled Nursing Facility Barriers to Discharge: SNF Pending bed offer, Continued Medical Work up, English as a second language teacher  Expected Discharge Plan and Services       Living arrangements for the past 2 months: Mobile Home                                       Social Determinants of Health (SDOH) Interventions SDOH Screenings   Food Insecurity: Food Insecurity Present (02/04/2023)  Housing: Medium Risk (02/04/2023)  Transportation Needs: Unmet Transportation Needs (02/04/2023)  Utilities: At Risk (02/04/2023)  Tobacco Use: High Risk (02/05/2023)    Readmission Risk Interventions    12/12/2021   11:03 AM 03/10/2021    2:14 PM 02/11/2021    1:11 PM  Readmission Risk Prevention Plan  Medication Screening  Complete   Transportation Screening Complete Complete Complete  PCP or Specialist Appt within 3-5 Days Not Complete    Home Care Screening   Complete  Medication Review (RN CM)   Complete  HRI or Home Care Consult Complete    Social Work Consult for Recovery Care Planning/Counseling Complete    Palliative Care Screening Not Applicable    Medication Review Special educational needs teacher) Complete

## 2023-02-08 NOTE — Plan of Care (Signed)

## 2023-02-08 NOTE — Care Management Important Message (Signed)
Important Message  Patient Details  Name: Larry [name] MRN: [id number] Date of Birth: 1951/03/18   Important Message Given:  Yes - Medicare IM     Teiana Hajduk 02/08/2023, 1:40 PM

## 2023-02-08 NOTE — Plan of Care (Signed)

## 2023-02-09 DIAGNOSIS — Y92009 Unspecified place in unspecified non-institutional (private) residence as the place of occurrence of the external cause: Secondary | ICD-10-CM | POA: Diagnosis not present

## 2023-02-09 DIAGNOSIS — E43 Unspecified severe protein-calorie malnutrition: Secondary | ICD-10-CM | POA: Diagnosis not present

## 2023-02-09 DIAGNOSIS — W19XXXA Unspecified fall, initial encounter: Secondary | ICD-10-CM | POA: Diagnosis not present

## 2023-02-09 DIAGNOSIS — D62 Acute posthemorrhagic anemia: Secondary | ICD-10-CM | POA: Diagnosis not present

## 2023-02-09 LAB — CBC
HCT: 22.5 % — ABNORMAL LOW (ref 39.0–52.0)
Hemoglobin: 7.9 g/dL — ABNORMAL LOW (ref 13.0–17.0)
MCH: 34.3 pg — ABNORMAL HIGH (ref 26.0–34.0)
MCHC: 35.1 g/dL (ref 30.0–36.0)
MCV: 97.8 fL (ref 80.0–100.0)
Platelets: 220 10*3/uL (ref 150–400)
RBC: 2.3 MIL/uL — ABNORMAL LOW (ref 4.22–5.81)
RDW: 14.8 % (ref 11.5–15.5)
WBC: 5 10*3/uL (ref 4.0–10.5)
nRBC: 0 % (ref 0.0–0.2)

## 2023-02-09 LAB — BASIC METABOLIC PANEL
Anion gap: 9 (ref 5–15)
BUN: 9 mg/dL (ref 8–23)
CO2: 25 mmol/L (ref 22–32)
Calcium: 8.3 mg/dL — ABNORMAL LOW (ref 8.9–10.3)
Chloride: 102 mmol/L (ref 98–111)
Creatinine, Ser: 0.64 mg/dL (ref 0.61–1.24)
GFR, Estimated: >60 mL/min (ref >60)
Glucose, Bld: 112 mg/dL — ABNORMAL HIGH (ref 70–99)
Potassium: 3.8 mmol/L (ref 3.5–5.1)
Sodium: 136 mmol/L (ref 135–145)

## 2023-02-09 LAB — HCV AB W REFLEX TO QUANT PCR: HCV Ab: NONREACTIVE

## 2023-02-09 LAB — HCV INTERPRETATION

## 2023-02-09 MED ORDER — METHOCARBAMOL 500 MG PO TABS
500.0000 mg | ORAL_TABLET | Freq: Four times a day (QID) | ORAL | Status: DC | PRN
  Administered 2023-02-12 – 2023-02-13 (×2): 500 mg via ORAL
  Filled 2023-02-09 (×2): qty 1

## 2023-02-09 MED ORDER — POLYETHYLENE GLYCOL 3350 17 G PO PACK
17.0000 g | PACK | Freq: Two times a day (BID) | ORAL | Status: DC
  Administered 2023-02-09 – 2023-02-11 (×4): 17 g via ORAL
  Filled 2023-02-09 (×8): qty 1

## 2023-02-09 MED ORDER — METHOCARBAMOL 1000 MG/10ML IJ SOLN: 500.0000 mg | Freq: Four times a day (QID) | INTRAMUSCULAR | Status: DC | PRN

## 2023-02-09 MED ORDER — DOCUSATE SODIUM 100 MG PO CAPS
100.0000 mg | ORAL_CAPSULE | Freq: Two times a day (BID) | ORAL | Status: DC
  Administered 2023-02-09 – 2023-02-13 (×8): 100 mg via ORAL
  Filled 2023-02-09 (×9): qty 1

## 2023-02-09 MED ORDER — LORAZEPAM 1 MG PO TABS
1.0000 mg | ORAL_TABLET | ORAL | Status: AC | PRN
  Administered 2023-02-10 (×2): 1 mg via ORAL
  Filled 2023-02-09 (×2): qty 1

## 2023-02-09 NOTE — Plan of Care (Signed)
[facility] Tonsillectomy/Adenoidectomy, Postoperative PEDS care plan entered in error.

## 2023-02-09 NOTE — Plan of Care (Signed)

## 2023-02-09 NOTE — Progress Notes (Signed)
Unable to secure a SNF bed with administration of therapeutic alcohol. Will stop and transition to CIWA scale and monitor for withdrawal before discharge . May observe at least 48 hours.

## 2023-02-09 NOTE — Progress Notes (Signed)
Physical Therapy Treatment Patient Details Name: Larry [name] MRN: [id number] DOB: December 04, 1950 Today's Date: 02/09/2023   History of Present Illness 72 y.o. male presented to the ED 02/04/23 with reports of a fall. +comminuted, displaced right femoral intratrochanteric fracture. Head CT swelling Lt frontal scalp. Underwent IM nailing of R hip on 10/14. PMH significant for alcohol abuse, COPD, hypertension, BPH, pneumoconiosis,  tobacco abuse    PT Comments  Pt greeted resting in bed and agreeable to session with continues progress towards acute goals. Pt requiring min A for bed mobility with pt able to maintain sitting up EOB for significantly increased time this session. Pt performing seated LE therex with cues for technique. Pt declining standing trials despite max education on importance of aggressive mobility post sx. Current plan remains appropriate to address deficits and maximize functional independence and decrease caregiver burden. Pt continues to benefit from skilled PT services to progress toward functional mobility goals.      If plan is discharge home, recommend the following: Other (comment) (currently not appropriate for home)   Can travel by private vehicle     No  Equipment Recommendations  None recommended by PT    Recommendations for Other Services       Precautions / Restrictions Precautions Precautions: Fall Restrictions Weight Bearing Restrictions: No RLE Weight Bearing: Weight bearing as tolerated     Mobility  Bed Mobility Overal bed mobility: Needs Assistance Bed Mobility: Supine to Sit, Sit to Supine     Supine to sit: Min assist Sit to supine: Min assist   General bed mobility comments: light min A to manage RLE    Transfers                   General transfer comment: pt declined today    Ambulation/Gait                   Stairs             Wheelchair Mobility     Tilt Bed    Modified Rankin (Stroke Patients  Only)       Balance Overall balance assessment: Needs assistance Sitting-balance support: Bilateral upper extremity supported Sitting balance-Leahy Scale: Fair Sitting balance - Comments: slight L lateral lean initially due to pain, able to progress to midline sitting with supervision with increased time seated up                                    Cognition Arousal: Alert Behavior During Therapy: [facility] for tasks assessed/performed Overall Cognitive Status: No family/caregiver present to determine baseline cognitive functioning                                 General Comments: potential to be easily agitated, cues/education needed for problem solving and importance of movement after surgery. showing insight into deficits but decreased insight into importance of mobility        Exercises General Exercises - Lower Extremity Long Arc Quad: AROM, Right, 10 reps, Seated Hip Flexion/Marching: AROM, Right, 10 reps, Seated Heel Raises: AROM, Right, 10 reps, Seated    General Comments General comments (skin integrity, edema, etc.): VSS on RA      Pertinent Vitals/Pain Pain Assessment Pain Assessment: Faces Faces Pain Scale: Hurts little more Pain Location: R hip Pain Descriptors / Indicators: Grimacing, Guarding  Pain Intervention(s): Monitored during session, Limited activity within patient's tolerance, Patient requesting pain meds-RN notified    Home Living                          Prior Function            PT Goals (current goals can now be found in the care plan section) Acute Rehab PT Goals Patient Stated Goal: to minimize pain PT Goal Formulation: With patient Time For Goal Achievement: 02/20/23 Progress towards PT goals: Progressing toward goals    Frequency    Min 1X/week      PT Plan      Co-evaluation              AM-PAC PT "6 Clicks" Mobility   Outcome Measure  Help needed turning from your back to your  side while in a flat bed without using bedrails?: A Lot Help needed moving from lying on your back to sitting on the side of a flat bed without using bedrails?: A Lot Help needed moving to and from a bed to a chair (including a wheelchair)?: Total Help needed standing up from a chair using your arms (e.g., wheelchair or bedside chair)?: Total Help needed to walk in hospital room?: Total Help needed climbing 3-5 steps with a railing? : Total 6 Click Score: 8    End of Session Equipment Utilized During Treatment: Oxygen Activity Tolerance: Patient limited by pain Patient left: in bed;with call bell/phone within reach;with bed alarm set Nurse Communication: Mobility status PT Visit Diagnosis: Pain;History of falling (Z91.81);Muscle weakness (generalized) (M62.81);Difficulty in walking, not elsewhere classified (R26.2) Pain - Right/Left: Right Pain - part of body: Hip     Time: 3474-2595 PT Time Calculation (min) (ACUTE ONLY): 25 min  Charges:    $Therapeutic Exercise: 8-22 mins $Therapeutic Activity: 8-22 mins PT General Charges $$ ACUTE PT VISIT: 1 Visit                     Kowen Kluth R. PTA Acute Rehabilitation Services Office: [phone]   Catalina Antigua 02/09/2023, 4:30 PM

## 2023-02-09 NOTE — TOC Progression Note (Signed)
Transition of Care [facility]) - Progression Note    Patient Details  Name: RUPESH JAKUBCZAK MRN: [id number] Date of Birth: 03-08-1951  Transition of Care [facility]) CM/SW Contact  Eduard Roux, [place] Phone Number: 02/09/2023, 1:47 PM  Clinical Narrative:     Patient is receiving  "therapeutic alcohol with meals " noted by MD. This is a barrier for placement at short term rehab. SNF will not administer alcohol to patient.     Antony Blackbird, MSW, LCSW Clinical Social Worker    Expected Discharge Plan: Skilled Nursing Facility Barriers to Discharge: SNF Pending bed offer, Continued Medical Work up, English as a second language teacher  Expected Discharge Plan and Services       Living arrangements for the past 2 months: Mobile Home                                       Social Determinants of Health (SDOH) Interventions SDOH Screenings   Food Insecurity: Food Insecurity Present (02/04/2023)  Housing: Medium Risk (02/04/2023)  Transportation Needs: Unmet Transportation Needs (02/04/2023)  Utilities: At Risk (02/04/2023)  Tobacco Use: High Risk (02/05/2023)    Readmission Risk Interventions    12/12/2021   11:03 AM 03/10/2021    2:14 PM 02/11/2021    1:11 PM  Readmission Risk Prevention Plan  Medication Screening  Complete   Transportation Screening Complete Complete Complete  PCP or Specialist Appt within 3-5 Days Not Complete    Home Care Screening   Complete  Medication Review (RN CM)   Complete  HRI or Home Care Consult Complete    Social Work Consult for Recovery Care Planning/Counseling Complete    Palliative Care Screening Not Applicable    Medication Review Oceanographer) Complete

## 2023-02-09 NOTE — Progress Notes (Signed)
PROGRESS NOTE    Larry [name]  WJX:[id number] DOB: 1950-12-11 DOA: 02/04/2023 PCP: Benita Stabile, MD    Brief Narrative:  Larry [name] is a 72 y.o. male with medical history significant for alcohol abuse, COPD, hypertension, BPH, pneumoconiosis,  tobacco abuse Patient presented to the ED reports of a fall.   Patient was trying to use the ATM at the bank across the street, he got out of his truck, and fell on the street.  He reported dizziness over the past several days present when standing, Drinks alcohol daily, he drinks about half of 1/5 of vodka with last drink of alcohol in the morning of fall. history of DTs requiring hospitalization.   Significant Events: Admitted 02/04/2023 for right intertrochanteric fracture 02-05-2023 IM nailing right intertrochanteric fracture 10/16 hgb drifted to 6.5, 1 unit blood with appropriate improvement.  Subjective:  Patient seen and examined.  No overnight events.  Pain is controlled.  He received beer from pharmacy in the morning, he prefers vodka though. He felt stuffed up with hard stool.  Increasing dose of MiraLAX today.  Assessment & Plan:   * Closed right hip fracture (HCC) 02-04-2023 S/p Fall.  Pelvic x-ray - comminuted, displaced right femoral intratrochanteric fracture.  02-05-2023 had IM nailing of right femur Pain managed with oral pain medications.  Laxatives and stool softeners on board.  Continue to work with PT OT.  Refer to a SNF for rehab.   Fall at home, initial encounter 02-04-2023 Reports orthostatic dizziness with resultant fall and subsequent hip fracture.  Head CT-soft tissue swelling left frontal scalp. Fall in the setting of chronic alcohol abuse.  Ct head, cxr, hip x-ray, x-ray right elbow and shoulder.  02-06-2023. Pt now is s/p IM nail to right femur for intertrochanteric fracture.     Right humeral head dislocation Chronic.  Not a candidate for surgery.     Alcohol abuse with risk of alcohol  withdrawal. Drinks half of 1/5 of vodka daily.  Last alcoholic beverage was the morning of arrival. Blood alcohol level unremarkable.  Prior hospitalization for alcohol withdrawal. Not currently withdrawing - started on therapeutic alcohol with meals, this should hopefully prevent withdrawal and its dangers.  We will need to make sure that SNF can administer it. - continue vitamins   Hypokalemia Replaced and improved.   Protein-calorie malnutrition, severe (HCC) See RD note 02-05-2023 Severe Malnutrition related to social / environmental circumstances as evidenced by meal completion < 25%, severe muscle depletion, severe fat depletion.  Nutritional supplements.   COPD (chronic obstructive pulmonary disease) (HCC) Stable.  Bronchodilator as needed.   BPH (benign prostatic hyperplasia) Stable.   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 BMI 16. -Nuttrition augmentation.   Hypertension Stable. -Resume Norvasc.  No orthostatics today.   Acute postoperative anemia due to expected blood loss 02-06-2023 drop in HgB from 11.8(admission) to 6.5, ortho no concerns for ongoing blood loss - 1 unit prbc given 10/16.  Appropriately responded.  Medically stable to transfer to SNF.   DVT prophylaxis: enoxaparin (LOVENOX) injection 40 mg Start: 02/06/23 0900 SCDs Start: 02/04/23 2154   Code Status: Full code Family Communication: None at the bedside Disposition Plan: Status is: Inpatient Remains inpatient appropriate because: Waiting for SNF.     Consultants:  Orthopedics  Procedures:  None  Antimicrobials:  None     Objective: Vitals:   02/08/23 2022 02/09/23 0039 02/09/23 0523 02/09/23 0753  BP: 125/71 (!) 140/86 133/74 (!) 155/73  Pulse: 99 96 100 (!)  52  Resp: 17 18 17 16   Temp: 98.6 F (37 C) 98.8 F (37.1 C) 99 F (37.2 C) 98.2 F (36.8 C)  TempSrc: Oral Axillary Oral Oral  SpO2: 94% 96% 96% 96%  Weight:      Height:        Intake/Output Summary (Last 24 hours)  at 02/09/2023 1042 Last data filed at 02/09/2023 0940 Gross per 24 hour  Intake 236 ml  Output 925 ml  Net -689 ml   Filed Weights   02/04/23 1409 02/05/23 1307  Weight: 56.7 kg 56 kg    Examination:  General: Looks fairly comfortable.  He is on room air.  Not in any distress.  Pleasant interaction today. Cardiovascular: S1-S2 normal.  Regular rate rhythm. Respiratory: Bilateral clear.  No added sounds. Gastrointestinal: Soft.  Nontender.  Bowel sound present. Ext: Right lateral hip incisions clean and dry.  Mild swelling and tenderness appropriate for postop findings. Neuro: Intact.      Data Reviewed: I have personally reviewed following labs and imaging studies  CBC: Recent Labs  Lab 02/04/23 1455 02/05/23 0819 02/05/23 1205 02/06/23 0510 02/06/23 1119 02/07/23 1134 02/07/23 2124 02/08/23 0554 02/08/23 1130 02/08/23 1958  WBC 3.7* 5.6  --  6.0  --   --   --  6.1  --   --   NEUTROABS 2.1  --   --   --   --   --   --   --   --   --   HGB 11.8* 8.9*   < > 7.6*   < > 6.5* 8.1* 8.0* 7.7* 8.0*  HCT 33.8* 26.7*   < > 22.2*   < > 19.0* 23.4* 23.1* 22.4* 23.3*  MCV 100.6* 104.7*  --  104.2*  --   --   --  97.5  --   --   PLT 159 144*  --  142*  --   --   --  171  --   --    < > = values in this interval not displayed.   Basic Metabolic Panel: Recent Labs  Lab 02/04/23 1455 02/04/23 2237 02/05/23 0819 02/06/23 0510 02/08/23 0554  NA 133*  --  136 135 134*  K 3.3*  --  4.4 3.9 3.8  CL 95*  --  102 99 98  CO2 28  --  25 26 27   GLUCOSE 107*  --  89 141* 110*  BUN 10  --  11 17 8   CREATININE 0.64  --  0.70 0.85 0.52*  CALCIUM 8.7*  --  8.4* 8.4* 8.5*  MG  --  1.6* 1.6* 1.9  --   PHOS  --  3.6 3.9 3.4  --    GFR: Estimated Creatinine Clearance: 66.1 mL/min (A) (by C-G formula based on SCr of 0.52 mg/dL (L)). Liver Function Tests: Recent Labs  Lab 02/04/23 1455 02/05/23 0819 02/06/23 0510  AST 78* 34 80*  ALT 34 23 23  ALKPHOS 86 67 95  BILITOT 1.3*  1.1 0.6  PROT 6.3* 5.2* 4.9*  ALBUMIN 3.8 3.0* 2.7*   No results for input(s): "LIPASE", "AMYLASE" in the last 168 hours. No results for input(s): "AMMONIA" in the last 168 hours. Coagulation Profile: Recent Labs  Lab 02/04/23 1515  INR 1.0   Cardiac Enzymes: No results for input(s): "CKTOTAL", "CKMB", "CKMBINDEX", "TROPONINI" in the last 168 hours. BNP (last 3 results) No results for input(s): "PROBNP" in the last 8760 hours. HbA1C: No results for  input(s): "HGBA1C" in the last 72 hours. CBG: No results for input(s): "GLUCAP" in the last 168 hours. Lipid Profile: No results for input(s): "CHOL", "HDL", "LDLCALC", "TRIG", "CHOLHDL", "LDLDIRECT" in the last 72 hours. Thyroid Function Tests: No results for input(s): "TSH", "T4TOTAL", "FREET4", "T3FREE", "THYROIDAB" in the last 72 hours. Anemia Panel: No results for input(s): "VITAMINB12", "FOLATE", "FERRITIN", "TIBC", "IRON", "RETICCTPCT" in the last 72 hours. Sepsis Labs: No results for input(s): "PROCALCITON", "LATICACIDVEN" in the last 168 hours.  Recent Results (from the past 240 hour(s))  Surgical pcr screen     Status: None   Collection Time: 02/05/23 10:09 AM   Specimen: Nasal Mucosa; Nasal Swab  Result Value Ref Range Status   MRSA, PCR NEGATIVE NEGATIVE Final   Staphylococcus aureus NEGATIVE NEGATIVE Final    Comment: (NOTE) The Xpert SA Assay (FDA approved for NASAL specimens in patients 17 years of age and older), is one component of a comprehensive surveillance program. It is not intended to diagnose infection nor to guide or monitor treatment. Performed at [facility], [address] [place], [place] [address]          Radiology Studies: No results found.      Scheduled Meds:  amLODipine  5 mg Oral Daily   docusate sodium  100 mg Oral BID   enoxaparin (LOVENOX) injection  40 mg Subcutaneous Q24H   folic acid  1 mg Oral Daily   multivitamin with minerals  1 tablet Oral Daily   nicotine   7 mg Transdermal Daily   polyethylene glycol  17 g Oral BID   spiritus frumenti  1 each Oral TID AC   thiamine  100 mg Oral Daily   Or   thiamine  100 mg Intravenous Daily   Continuous Infusions:     LOS: 5 days    Time spent: 35 minutes    Dorcas Carrow, MD [facility]

## 2023-02-10 DIAGNOSIS — S72001A Fracture of unspecified part of neck of right femur, initial encounter for closed fracture: Secondary | ICD-10-CM | POA: Diagnosis not present

## 2023-02-10 LAB — BASIC METABOLIC PANEL
Anion gap: 7 (ref 5–15)
BUN: 9 mg/dL (ref 8–23)
CO2: 22 mmol/L (ref 22–32)
Calcium: 8.6 mg/dL — ABNORMAL LOW (ref 8.9–10.3)
Chloride: 105 mmol/L (ref 98–111)
Creatinine, Ser: 0.57 mg/dL — ABNORMAL LOW (ref 0.61–1.24)
GFR, Estimated: >60 mL/min (ref >60)
Glucose, Bld: 98 mg/dL (ref 70–99)
Potassium: 4.2 mmol/L (ref 3.5–5.1)
Sodium: 134 mmol/L — ABNORMAL LOW (ref 135–145)

## 2023-02-10 LAB — CBC
HCT: 26.9 % — ABNORMAL LOW (ref 39.0–52.0)
Hemoglobin: 9.1 g/dL — ABNORMAL LOW (ref 13.0–17.0)
MCH: 33.6 pg (ref 26.0–34.0)
MCHC: 33.8 g/dL (ref 30.0–36.0)
MCV: 99.3 fL (ref 80.0–100.0)
Platelets: 269 10*3/uL (ref 150–400)
RBC: 2.71 MIL/uL — ABNORMAL LOW (ref 4.22–5.81)
RDW: 14.8 % (ref 11.5–15.5)
WBC: 5.3 10*3/uL (ref 4.0–10.5)
nRBC: 0 % (ref 0.0–0.2)

## 2023-02-10 NOTE — Progress Notes (Signed)
PROGRESS NOTE    Larry [name]  WUJ:[id number]  DOB: 07-07-1950  DOA: 02/04/2023 PCP: Benita Stabile, MD Outpatient Specialists:   Hospital course:  Larry [name] is a 72 y.o. male with medical history significant for alcohol abuse, COPD, hypertension, BPH, pneumoconiosis,  tobacco abuse Patient presented to the ED reports of a fall.   Patient was trying to use the ATM at the bank across the street, he got out of his truck, and fell on the street.  He reported dizziness over the past several days present when standing, Drinks alcohol daily, he drinks about half of 1/5 of vodka with last drink of alcohol in the morning of fall. history of DTs requiring hospitalization.   Significant Events: Admitted 02/04/2023 for right intertrochanteric fracture 02-05-2023 IM nailing right intertrochanteric fracture 10/16 hgb drifted to 6.5, 1 unit blood with appropriate improvement.   Subjective:  Patient is doing okay, has no complaints.  Notes the pain is controlled.  Notes that he had a bowel movement yesterday   Objective: Vitals:   02/09/23 2011 02/09/23 2352 02/10/23 0501 02/10/23 0810  BP: 129/83 (!) 126/59 130/71 134/72  Pulse: (!) 51 (!) 47 (!) 48 (!) 48  Resp: 18 20 18 16   Temp: 98.6 F (37 C) 98 F (36.7 C) 98.3 F (36.8 C) 99.6 F (37.6 C)  TempSrc: Oral Oral Oral Oral  SpO2: 97% 100% 97% 97%  Weight:      Height:        Intake/Output Summary (Last 24 hours) at 02/10/2023 1509 Last data filed at 02/10/2023 9562 Gross per 24 hour  Intake 120 ml  Output 1725 ml  Net -1605 ml   Filed Weights   02/04/23 1409 02/05/23 1307  Weight: 56.7 kg 56 kg     Exam:  General: Chronically ill appearing patient looking older than stated age lying in bed in NAD eating breakfast Eyes: sclera anicteric, conjuctiva mild injection bilaterally CVS: S1-S2, regular  Respiratory:  decreased air entry bilaterally secondary to decreased inspiratory effort, rales at bases  GI: NABS,  soft, NT  LE: Warm and well-perfused Neuro: A/O x 3,  grossly nonfocal.   Data Reviewed:  Basic Metabolic Panel: Recent Labs  Lab 02/04/23 2237 02/05/23 0819 02/06/23 0510 02/08/23 0554 02/09/23 1107 02/10/23 0709  NA  --  136 135 134* 136 134*  K  --  4.4 3.9 3.8 3.8 4.2  CL  --  102 99 98 102 105  CO2  --  25 26 27 25 22   GLUCOSE  --  89 141* 110* 112* 98  BUN  --  11 17 8 9 9   CREATININE  --  0.70 0.85 0.52* 0.64 0.57*  CALCIUM  --  8.4* 8.4* 8.5* 8.3* 8.6*  MG 1.6* 1.6* 1.9  --   --   --   PHOS 3.6 3.9 3.4  --   --   --     CBC: Recent Labs  Lab 02/04/23 1455 02/05/23 0819 02/05/23 1205 02/06/23 0510 02/06/23 1119 02/08/23 0554 02/08/23 1130 02/08/23 1958 02/09/23 1107 02/10/23 0709  WBC 3.7* 5.6  --  6.0  --  6.1  --   --  5.0 5.3  NEUTROABS 2.1  --   --   --   --   --   --   --   --   --   HGB 11.8* 8.9*   < > 7.6*   < > 8.0* 7.7* 8.0* 7.9* 9.1*  HCT 33.8* 26.7*   < > 22.2*   < > 23.1* 22.4* 23.3* 22.5* 26.9*  MCV 100.6* 104.7*  --  104.2*  --  97.5  --   --  97.8 99.3  PLT 159 144*  --  142*  --  171  --   --  220 269   < > = values in this interval not displayed.     Scheduled Meds:  amLODipine  5 mg Oral Daily   docusate sodium  100 mg Oral BID   enoxaparin (LOVENOX) injection  40 mg Subcutaneous Q24H   folic acid  1 mg Oral Daily   multivitamin with minerals  1 tablet Oral Daily   nicotine  7 mg Transdermal Daily   polyethylene glycol  17 g Oral BID   thiamine  100 mg Oral Daily   Or   thiamine  100 mg Intravenous Daily   Continuous Infusions:   Assessment & Plan:    HTN BP under good control with resumption of home doses of amlodipine  EtOH use Known history of complicated withdrawal Patient has been maintained on "therapeutic alcohol ", beer with meals--which has been effective in preventing Dts I have reached out to which has been effective in preventing DTs which has been effective in preventing DTs  I have reached out to [facility] to  ensure this will not affect placement in SNF  ABLA H/H stable after transfusion post op    Copied and pasted from previous note:  Closed right hip fracture (HCC) 02-04-2023 S/p Fall.  Pelvic x-ray - comminuted, displaced right femoral intratrochanteric fracture.  02-05-2023 had IM nailing of right femur Pain managed with oral pain medications.  Laxatives and stool softeners on board.  Continue to work with PT OT.  Refer to a SNF for rehab.   Fall at home, initial encounter 02-04-2023 Reports orthostatic dizziness with resultant fall and subsequent hip fracture.  Head CT-soft tissue swelling left frontal scalp. Fall in the setting of chronic alcohol abuse.  Ct head, cxr, hip x-ray, x-ray right elbow and shoulder.  02-06-2023. Pt now is s/p IM nail to right femur for intertrochanteric fracture.     Right humeral head dislocation Chronic.  Not a candidate for surgery.     Alcohol abuse with risk of alcohol withdrawal. Drinks half of 1/5 of vodka daily.  Last alcoholic beverage was the morning of arrival. Blood alcohol level unremarkable.  Prior hospitalization for alcohol withdrawal. Not currently withdrawing - started on therapeutic alcohol with meals, this should hopefully prevent withdrawal and its dangers.  We will need to make sure that SNF can administer it. - continue vitamins    Protein-calorie malnutrition, severe (HCC) See RD note 02-05-2023 Severe Malnutrition related to social / environmental circumstances as evidenced by meal completion < 25%, severe muscle depletion, severe fat depletion.  Nutritional supplements.   COPD (chronic obstructive pulmonary disease) (HCC) Stable.  Bronchodilator as needed.   BPH (benign prostatic hyperplasia) Stable.   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 BMI 16. -Nuttrition augmentation.   Hypertension Stable. -Resume Norvasc.  No orthostatics today.    Medically stable to transfer to SNF.     DVT prophylaxis: Lovenox Code  Status: Full Family Communication: None today     Studies: No results found.  Principal Problem:   Closed right hip fracture [facility]) Active Problems:   Alcohol abuse   Fall at home, initial encounter   Hypertension   Failure to thrive in adult   BPH (  benign prostatic hyperplasia)   COPD (chronic obstructive pulmonary disease) (HCC)   Protein-calorie malnutrition, severe (HCC)   Hypokalemia   Acute postoperative anemia due to expected blood loss     Larry [name], [facility]  If 7PM-7AM, please contact night-coverage [email or web address]   LOS: 6 days

## 2023-02-10 NOTE — Plan of Care (Signed)

## 2023-02-11 DIAGNOSIS — S72001A Fracture of unspecified part of neck of right femur, initial encounter for closed fracture: Secondary | ICD-10-CM | POA: Diagnosis not present

## 2023-02-11 LAB — CBC
HCT: 26 % — ABNORMAL LOW (ref 39.0–52.0)
Hemoglobin: 8.7 g/dL — ABNORMAL LOW (ref 13.0–17.0)
MCH: 34.1 pg — ABNORMAL HIGH (ref 26.0–34.0)
MCHC: 33.5 g/dL (ref 30.0–36.0)
MCV: 102 fL — ABNORMAL HIGH (ref 80.0–100.0)
Platelets: 314 10*3/uL (ref 150–400)
RBC: 2.55 MIL/uL — ABNORMAL LOW (ref 4.22–5.81)
RDW: 15 % (ref 11.5–15.5)
WBC: 7 10*3/uL (ref 4.0–10.5)
nRBC: 0 % (ref 0.0–0.2)

## 2023-02-11 LAB — BASIC METABOLIC PANEL
Anion gap: 8 (ref 5–15)
BUN: 16 mg/dL (ref 8–23)
CO2: 25 mmol/L (ref 22–32)
Calcium: 8.5 mg/dL — ABNORMAL LOW (ref 8.9–10.3)
Chloride: 100 mmol/L (ref 98–111)
Creatinine, Ser: 0.72 mg/dL (ref 0.61–1.24)
GFR, Estimated: >60 mL/min (ref >60)
Glucose, Bld: 99 mg/dL (ref 70–99)
Potassium: 4.3 mmol/L (ref 3.5–5.1)
Sodium: 133 mmol/L — ABNORMAL LOW (ref 135–145)

## 2023-02-11 NOTE — Progress Notes (Signed)
PROGRESS NOTE    Larry [name]  WGN:[id number]  DOB: 1950/05/03  DOA: 02/04/2023 PCP: Benita Stabile, MD Outpatient Specialists:   Hospital course:  Larry [name] is a 72 y.o. male with medical history significant for alcohol abuse, COPD, hypertension, BPH, pneumoconiosis,  tobacco abuse Patient presented to the ED reports of a fall.   Patient was trying to use the ATM at the bank across the street, he got out of his truck, and fell on the street.  He reported dizziness over the past several days present when standing, Drinks alcohol daily, he drinks about half of 1/5 of vodka with last drink of alcohol in the morning of fall. history of DTs requiring hospitalization.   Significant Events: Admitted 02/04/2023 for right intertrochanteric fracture 02-05-2023 IM nailing right intertrochanteric fracture 10/16 hgb drifted to 6.5, 1 unit blood with appropriate improvement.   Subjective:  Spoke with patient about need to be off of alcohol prior to going to rehab.  Patient states even though he has a Theatre stage manager high life at bedside he has not had anything to drink for the past 3 days.  Patient admits to complicated withdrawal including DTs after 3 to 4 days of not drinking in the past.  At present patient states he feels fine, denies any anxiety or shakes.   Objective: Vitals:   02/10/23 2024 02/11/23 0439 02/11/23 0752 02/11/23 1548  BP: 131/64 (!) 156/78 (!) 150/73 117/66  Pulse: 95 98 100 97  Resp:   16 16  Temp: 98.2 F (36.8 C) 98.6 F (37 C) 98.6 F (37 C) 98.7 F (37.1 C)  TempSrc: Oral Oral Oral Oral  SpO2: 96% 96% 97% 96%  Weight:      Height:        Intake/Output Summary (Last 24 hours) at 02/11/2023 1653 Last data filed at 02/11/2023 1616 Gross per 24 hour  Intake --  Output 1750 ml  Net -1750 ml   Filed Weights   02/04/23 1409 02/05/23 1307  Weight: 56.7 kg 56 kg     Exam:  General: Chronically ill appearing patient looking older than stated age lying in  bed in NAD eating breakfast Eyes: sclera anicteric, conjuctiva mild injection bilaterally CVS: S1-S2, regular  Respiratory:  decreased air entry bilaterally secondary to decreased inspiratory effort, rales at bases  GI: NABS, soft, NT  LE: Warm and well-perfused Neuro: A/O x 3,  grossly nonfocal.   Data Reviewed:  Basic Metabolic Panel: Recent Labs  Lab 02/04/23 2237 02/05/23 0819 02/05/23 0819 02/06/23 0510 02/08/23 0554 02/09/23 1107 02/10/23 0709 02/11/23 1343  NA  --  136   < > 135 134* 136 134* 133*  K  --  4.4   < > 3.9 3.8 3.8 4.2 4.3  CL  --  102   < > 99 98 102 105 100  CO2  --  25   < > 26 27 25 22 25   GLUCOSE  --  89   < > 141* 110* 112* 98 99  BUN  --  11   < > 17 8 9 9 16   CREATININE  --  0.70   < > 0.85 0.52* 0.64 0.57* 0.72  CALCIUM  --  8.4*   < > 8.4* 8.5* 8.3* 8.6* 8.5*  MG 1.6* 1.6*  --  1.9  --   --   --   --   PHOS 3.6 3.9  --  3.4  --   --   --   --    < > =  values in this interval not displayed.    CBC: Recent Labs  Lab 02/06/23 0510 02/06/23 1119 02/08/23 0554 02/08/23 1130 02/08/23 1958 02/09/23 1107 02/10/23 0709 02/11/23 1343  WBC 6.0  --  6.1  --   --  5.0 5.3 7.0  HGB 7.6*   < > 8.0* 7.7* 8.0* 7.9* 9.1* 8.7*  HCT 22.2*   < > 23.1* 22.4* 23.3* 22.5* 26.9* 26.0*  MCV 104.2*  --  97.5  --   --  97.8 99.3 102.0*  PLT 142*  --  171  --   --  220 269 314   < > = values in this interval not displayed.     Scheduled Meds:  amLODipine  5 mg Oral Daily   docusate sodium  100 mg Oral BID   enoxaparin (LOVENOX) injection  40 mg Subcutaneous Q24H   folic acid  1 mg Oral Daily   multivitamin with minerals  1 tablet Oral Daily   nicotine  7 mg Transdermal Daily   polyethylene glycol  17 g Oral BID   thiamine  100 mg Oral Daily   Or   thiamine  100 mg Intravenous Daily   Continuous Infusions:   Assessment & Plan:    HTN BP under good control with resumption of home doses of amlodipine  EtOH use Known history of complicated  withdrawal, DTs after 3 to 4 days Patient had been maintained on "therapeutic alcohol" here but needs to be off all alcohol to go to SNF Discussed with the patient, he notes he has not had anything to drink for 3 days even though he has a Theatre stage manager high life at bedside. Patient requested that I keep the can there although he promised me he would not drink any of the alcohol.  Discussed with RN, patient on CIWA protocol.  ABLA H/H stable after transfusion post op    Copied and pasted from previous note:  Closed right hip fracture (HCC) 02-04-2023 S/p Fall.  Pelvic x-ray - comminuted, displaced right femoral intratrochanteric fracture.  02-05-2023 had IM nailing of right femur Pain managed with oral pain medications.  Laxatives and stool softeners on board.  Continue to work with PT OT.  Refer to a SNF for rehab.   Fall at home, initial encounter 02-04-2023 Reports orthostatic dizziness with resultant fall and subsequent hip fracture.  Head CT-soft tissue swelling left frontal scalp. Fall in the setting of chronic alcohol abuse.  Ct head, cxr, hip x-ray, x-ray right elbow and shoulder.  02-06-2023. Pt now is s/p IM nail to right femur for intertrochanteric fracture.     Right humeral head dislocation Chronic.  Not a candidate for surgery.     Alcohol abuse with risk of alcohol withdrawal. Drinks half of 1/5 of vodka daily.  Last alcoholic beverage was the morning of arrival. Blood alcohol level unremarkable.  Prior hospitalization for alcohol withdrawal. Not currently withdrawing - started on therapeutic alcohol with meals, this should hopefully prevent withdrawal and its dangers.  We will need to make sure that SNF can administer it. - continue vitamins    Protein-calorie malnutrition, severe (HCC) See RD note 02-05-2023 Severe Malnutrition related to social / environmental circumstances as evidenced by meal completion < 25%, severe muscle depletion, severe fat depletion.  Nutritional  supplements.   COPD (chronic obstructive pulmonary disease) (HCC) Stable.  Bronchodilator as needed.   BPH (benign prostatic hyperplasia) Stable.   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 BMI 16. -Nuttrition augmentation.   Hypertension  Stable. -Resume Norvasc.  No orthostatics today.    Medically stable to transfer to SNF.     DVT prophylaxis: Lovenox Code Status: Full Family Communication: None today     Studies: No results found.  Principal Problem:   Closed right hip fracture (HCC) Active Problems:   Alcohol abuse   Fall at home, initial encounter   Hypertension   Failure to thrive in adult   BPH (benign prostatic hyperplasia)   COPD (chronic obstructive pulmonary disease) (HCC)   Protein-calorie malnutrition, severe (HCC)   Hypokalemia   Acute postoperative anemia due to expected blood loss     Joanathan Affeldt Orma Flaming, [facility]  If 7PM-7AM, please contact night-coverage [email or web address]   LOS: 7 days

## 2023-02-12 DIAGNOSIS — W19XXXA Unspecified fall, initial encounter: Secondary | ICD-10-CM | POA: Diagnosis not present

## 2023-02-12 DIAGNOSIS — F1010 Alcohol abuse, uncomplicated: Secondary | ICD-10-CM | POA: Diagnosis not present

## 2023-02-12 DIAGNOSIS — R627 Adult failure to thrive: Secondary | ICD-10-CM | POA: Diagnosis not present

## 2023-02-12 DIAGNOSIS — S72001A Fracture of unspecified part of neck of right femur, initial encounter for closed fracture: Secondary | ICD-10-CM | POA: Diagnosis not present

## 2023-02-12 LAB — CBC
HCT: 26.6 % — ABNORMAL LOW (ref 39.0–52.0)
Hemoglobin: 8.8 g/dL — ABNORMAL LOW (ref 13.0–17.0)
MCH: 33.7 pg (ref 26.0–34.0)
MCHC: 33.1 g/dL (ref 30.0–36.0)
MCV: 101.9 fL — ABNORMAL HIGH (ref 80.0–100.0)
Platelets: 291 10*3/uL (ref 150–400)
RBC: 2.61 MIL/uL — ABNORMAL LOW (ref 4.22–5.81)
RDW: 15 % (ref 11.5–15.5)
WBC: 6.5 10*3/uL (ref 4.0–10.5)
nRBC: 0 % (ref 0.0–0.2)

## 2023-02-12 LAB — BASIC METABOLIC PANEL
Anion gap: 8 (ref 5–15)
BUN: 14 mg/dL (ref 8–23)
CO2: 24 mmol/L (ref 22–32)
Calcium: 8.6 mg/dL — ABNORMAL LOW (ref 8.9–10.3)
Chloride: 100 mmol/L (ref 98–111)
Creatinine, Ser: 0.63 mg/dL (ref 0.61–1.24)
GFR, Estimated: >60 mL/min (ref >60)
Glucose, Bld: 98 mg/dL (ref 70–99)
Potassium: 4.6 mmol/L (ref 3.5–5.1)
Sodium: 132 mmol/L — ABNORMAL LOW (ref 135–145)

## 2023-02-12 NOTE — TOC Progression Note (Addendum)
Transition of Care [facility]) - Progression Note    Patient Details  Name: Larry [name] MRN: [id number] Date of Birth: 01/08/1951  Transition of Care [facility]) CM/SW Contact  Doy Taaffe A [place], [place] Phone Number: 02/12/2023, 12:19 PM  Clinical Narrative:     Update 1445 CSW started insurance authorization,status approved.   Auth ID: [id number]  Approval Dates: 02/13/2023-02/15/2023   Bed available at [facility] tomorrow.   CSW met with pt at bedside. CSW provided him with updated bed offer to [facility] in [place]. He stated he would choose it as his facility choice since it was closer to [place] area. CSW to follow up with facility regarding bed availability. Pt medically stable, will start insurance authorization.   TOC will continue to follow.   Expected Discharge Plan: Skilled Nursing Facility Barriers to Discharge: SNF Pending bed offer, Continued Medical Work up, English as a second language teacher  Expected Discharge Plan and Services       Living arrangements for the past 2 months: Mobile Home                                       Social Determinants of Health (SDOH) Interventions SDOH Screenings   Food Insecurity: Food Insecurity Present (02/04/2023)  Housing: Medium Risk (02/04/2023)  Transportation Needs: Unmet Transportation Needs (02/04/2023)  Utilities: At Risk (02/04/2023)  Tobacco Use: High Risk (02/05/2023)    Readmission Risk Interventions    12/12/2021   11:03 AM 03/10/2021    2:14 PM 02/11/2021    1:11 PM  Readmission Risk Prevention Plan  Medication Screening  Complete   Transportation Screening Complete Complete Complete  PCP or Specialist Appt within 3-5 Days Not Complete    Home Care Screening   Complete  Medication Review (RN CM)   Complete  HRI or Home Care Consult Complete    Social Work Consult for Recovery Care Planning/Counseling Complete    Palliative Care Screening Not Applicable    Medication Review Furniture conservator/restorer) Complete

## 2023-02-12 NOTE — Progress Notes (Signed)
Physical Therapy Treatment Patient Details Name: Larry [name] MRN: [id number] DOB: 1951/04/03 Today's Date: 02/12/2023   History of Present Illness 72 y.o. male presented to the ED 02/04/23 with reports of a fall. +comminuted, displaced right femoral intratrochanteric fracture. Head CT swelling Lt frontal scalp. Underwent IM nailing of R hip on 10/14. PMH significant for alcohol abuse, COPD, hypertension, BPH, pneumoconiosis,  tobacco abuse    PT Comments  Pt greeted resting in bed and agreeable to session with continued progress towards acute goals. Pt requiring CGA for bed mobility and min A from elevate EOB to power up to standing with RW for support. Pt able to complete pre-gait activities in standing and take a few steps away and back to EOB with min A to steady and cues for technique/sequencing. Pt declining further gait trials due to pain despite education on importance of mobility and encouragement. Current plan remains appropriate to address deficits and maximize functional independence and decrease caregiver burden. Pt continues to benefit from skilled PT services to progress toward functional mobility goals.      If plan is discharge home, recommend the following: Other (comment) (currently not appropriate for home)   Can travel by private vehicle     No  Equipment Recommendations  None recommended by PT    Recommendations for Other Services       Precautions / Restrictions Precautions Precautions: Fall Restrictions Weight Bearing Restrictions: No RLE Weight Bearing: Weight bearing as tolerated     Mobility  Bed Mobility Overal bed mobility: Needs Assistance Bed Mobility: Supine to Sit, Sit to Supine     Supine to sit: Contact guard Sit to supine: Contact guard assist   General bed mobility comments: CGA for safety, increased time and cues to scoot out to EOB    Transfers Overall transfer level: Needs assistance Equipment used: Rolling walker (2  wheels) Transfers: Sit to/from Stand Sit to Stand: Min assist, From elevated surface           General transfer comment: min A to power up from eleavted EOB    Ambulation/Gait               General Gait Details: pt able to take a few steps forward and then back to EOB, declining further trials due to pain   Stairs             Wheelchair Mobility     Tilt Bed    Modified Rankin (Stroke Patients Only)       Balance Overall balance assessment: Needs assistance Sitting-balance support: Bilateral upper extremity supported Sitting balance-Leahy Scale: Good     Standing balance support: Bilateral upper extremity supported, During functional activity Standing balance-Leahy Scale: Poor Standing balance comment: heavy reliance on UE support                            Cognition Arousal: Alert Behavior During Therapy: WFL for tasks assessed/performed Overall Cognitive Status: No family/caregiver present to determine baseline cognitive functioning                                 General Comments: potential to be easily agitated, cues/education needed for problem solving and importance of movement after surgery. showing insight into deficits but decreased insight into importance of mobility        Exercises Other Exercises Other Exercises: standing marching RLE x10  General Comments General comments (skin integrity, edema, etc.): VSS on RA      Pertinent Vitals/Pain Pain Assessment Pain Assessment: Faces Faces Pain Scale: Hurts even more Pain Location: R hip Pain Descriptors / Indicators: Grimacing, Guarding Pain Intervention(s): Monitored during session, Limited activity within patient's tolerance    Home Living                          Prior Function            PT Goals (current goals can now be found in the care plan section) Acute Rehab PT Goals Patient Stated Goal: to walk PT Goal Formulation: With  patient Time For Goal Achievement: 02/20/23 Progress towards PT goals: Progressing toward goals    Frequency    Min 1X/week      PT Plan      Co-evaluation              AM-PAC PT "6 Clicks" Mobility   Outcome Measure  Help needed turning from your back to your side while in a flat bed without using bedrails?: A Little Help needed moving from lying on your back to sitting on the side of a flat bed without using bedrails?: A Little Help needed moving to and from a bed to a chair (including a wheelchair)?: A Lot Help needed standing up from a chair using your arms (e.g., wheelchair or bedside chair)?: A Lot Help needed to walk in hospital room?: A Lot Help needed climbing 3-5 steps with a railing? : Total 6 Click Score: 13    End of Session   Activity Tolerance: Patient limited by pain Patient left: in bed;with call bell/phone within reach;with bed alarm set Nurse Communication: Mobility status PT Visit Diagnosis: Pain;History of falling (Z91.81);Muscle weakness (generalized) (M62.81);Difficulty in walking, not elsewhere classified (R26.2) Pain - Right/Left: Right Pain - part of body: Hip     Time: 1539-1600 PT Time Calculation (min) (ACUTE ONLY): 21 min  Charges:    $Therapeutic Activity: 8-22 mins PT General Charges $$ ACUTE PT VISIT: 1 Visit                     Larry [name] R. PTA Acute Rehabilitation Services Office: [phone]   Catalina Antigua 02/12/2023, 4:08 PM

## 2023-02-12 NOTE — Progress Notes (Signed)
PROGRESS NOTE    Larry [name]  MWU:[id number] DOB: 1951/03/07 DOA: 02/04/2023 PCP: Benita Stabile, MD  Subjective: Pt seen and examined. No etoh withdrawal symptoms. Pt still has his can of beer(unopened) at bedside. But it is not within his reach.  Pt has chosen [facility] in [place], [place]. He states it will be more convenient for his wife to come and visit him.   Hospital Course: HPI: Larry [name] is a 72 y.o. male with medical history significant for alcohol abuse, COPD, hypertension, BPH, pneumoconiosis,  tobacco abuse Patient presented to the ED reports of a fall.   Patient was trying to use the ATM  the bank across the street, he got out of his truck, and fell on the street.  He reports dizziness over the past several days present when standing, he reports feeling unwell, with poor oral intake over the past 2 to 3 weeks.  No vomiting.  Reports occasional diarrhea.  Reports chronic difficulty voiding.  No chest pain.  Chronic and unchanged difficulty breathing. Drinks alcohol daily, he drinks about half of 1/5 of vodka.  He reports his last drink was today at about 10 AM.  He reports he drank a small amount, compared to what he normally drinks.  Reports a history of DTs requiring hospitalization.  Significant Events: Admitted 02/04/2023 for right intertrochanteric fracture 02-05-2023 IM nailing right intertrochanteric fracture  Significant Labs: Admitting HgB 11.8 02-06-2023 HgB 7.6  Significant Imaging Studies: Admission right hip XR shows Comminuted, displaced right femoral intratrochanteric fracture   Antibiotic Therapy: Anti-infectives (From admission, onward)    Start     Dose/Rate Route Frequency Ordered Stop   02/06/23 0600  ceFAZolin (ANCEF) IVPB 2g/100 mL premix        2 g 200 mL/hr over 30 Minutes Intravenous On call to O.R. 02/05/23 1243 02/05/23 1510   02/05/23 2200  ceFAZolin (ANCEF) IVPB 2g/100 mL premix        2 g 200 mL/hr over 30 Minutes  Intravenous Every 8 hours 02/05/23 1920 02/06/23 2159       Procedures: 02-05-2023 IM nail right intertroch fracture  Consultants: Orthopedic(haddix)    Assessment and Plan: * Closed right hip fracture (HCC) 02-04-2023 S/p Fall.  Pelvic x-ray - comminuted, displaced right femoral intratrochanteric fracture.  EDP talked to Dr. Charlann Boxer, will see in consult, admit to [facility], possible surgery 10/1.  N.P.O. midnight -IV Dilaudid 0.5 mg every 4 hourly prn -Chest X-ray clear, obtain Pre-op EKG  02-05-2023 had IM nailing of right femur  02-06-2023 will need SNF placement.  Fall at home, initial encounter 02-04-2023 Reports orthostatic dizziness with resultant fall and subsequent hip fracture.  Head CT-soft tissue swelling left frontal scalp. Fall in the setting of chronic alcohol abuse.  patient reports poor oral intake over the past 2 to 3 weeks.  Tachycardic heart rate 94-104 dizziness is not new. - N/s + 20kcl 75cc/hr x 12hrs  02-06-2023. Pt now is s/p IM nail to right femur for intertrochanteric fracture. Will need PT and CM for SNF.   Alcohol abuse Drinks half of 1/5 of vodka daily.  Last alcoholic beverage was this morning at about 10 AM.  Blood alcohol level unremarkable.  Prior hospitalization for alcohol withdrawal,- 11/2021 patient did well with CIWA protocol. -CIWA as needed -Thiamine, folate, multivitamins -Check mag, Phos  02-06-2023 pt received 4 mg IV push ativan. Pt is now somnolent and requiring supplemental O2. 02-12-2023 has been without alcohol for several days  now. Last alcoholic beverage was on 02-09-2023 around 11 AM.   Hypokalemia 02-04-2023 Mild hypokalemia. Potassium 3.3. 02-06-2023 resolved after replacement  Protein-calorie malnutrition, severe (HCC) See RD note 02-05-2023 Severe Malnutrition related to social / environmental circumstances as evidenced by meal completion < 25%, severe muscle depletion, severe fat depletion.    COPD (chronic obstructive  pulmonary disease) (HCC) History of COPD and pneumoconiosis.  Chest x-ray clear. Not On home O2.  BPH (benign prostatic hyperplasia) Stable.  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 BMI 16. -Nuttrition consult says he has severe protein calorie malnutrition.  Hypertension Stable. -Resume Norvasc 02-06-2023 BP stable  Acute postoperative anemia due to expected blood loss 02-06-2023 drop in HgB from 11.8(admission) to 7.6(today). likely a combo bleeding from initial fracture and recent surgery. No need for transfusion at this point.   DVT prophylaxis: enoxaparin (LOVENOX) injection 40 mg Start: 02/06/23 0900 SCDs Start: 02/04/23 2154    Code Status: Full Code Family Communication: no family at bedside. Pt is decisional. Disposition Plan: SNF Reason for continuing need for hospitalization: medically stable for DC. CM working on SNF bed and insurance authorization.  Objective: Vitals:   02/11/23 1548 02/11/23 2036 02/12/23 0501 02/12/23 0725  BP: 117/66 133/63 (!) 141/60 (!) 140/71  Pulse: 97 91 94 95  Resp: 16 18 18 17   Temp: 98.7 F (37.1 C) 98.8 F (37.1 C) 98.4 F (36.9 C) 98.4 F (36.9 C)  TempSrc: Oral Oral Oral Oral  SpO2: 96% 97% 97% 96%  Weight:      Height:        Intake/Output Summary (Last 24 hours) at 02/12/2023 1231 Last data filed at 02/12/2023 1035 Gross per 24 hour  Intake 118 ml  Output 1450 ml  Net -1332 ml   Filed Weights   02/04/23 1409 02/05/23 1307  Weight: 56.7 kg 56 kg    Examination:  Physical Exam Vitals and nursing note reviewed.  Constitutional:      General: He is not in acute distress.    Appearance: He is not toxic-appearing or diaphoretic.     Comments: Appears chronically ill  HENT:     Head: Normocephalic and atraumatic.  Eyes:     General: No scleral icterus. Cardiovascular:     Rate and Rhythm: Normal rate and regular rhythm.  Pulmonary:     Effort: Pulmonary effort is normal.  Abdominal:     General: Abdomen is  flat. Bowel sounds are normal.     Palpations: Abdomen is soft.  Skin:    General: Skin is warm and dry.     Capillary Refill: Capillary refill takes less than 2 seconds.  Neurological:     General: No focal deficit present.     Mental Status: He is alert and oriented to person, place, and time.     Data Reviewed: I have personally reviewed following labs and imaging studies  CBC: Recent Labs  Lab 02/08/23 0554 02/08/23 1130 02/08/23 1958 02/09/23 1107 02/10/23 0709 02/11/23 1343 02/12/23 0706  WBC 6.1  --   --  5.0 5.3 7.0 6.5  HGB 8.0*   < > 8.0* 7.9* 9.1* 8.7* 8.8*  HCT 23.1*   < > 23.3* 22.5* 26.9* 26.0* 26.6*  MCV 97.5  --   --  97.8 99.3 102.0* 101.9*  PLT 171  --   --  220 269 314 291   < > = values in this interval not displayed.   Basic Metabolic Panel: Recent Labs  Lab 02/06/23 0510  02/08/23 0554 02/09/23 1107 02/10/23 0709 02/11/23 1343 02/12/23 0706  NA 135 134* 136 134* 133* 132*  K 3.9 3.8 3.8 4.2 4.3 4.6  CL 99 98 102 105 100 100  CO2 26 27 25 22 25 24   GLUCOSE 141* 110* 112* 98 99 98  BUN 17 8 9 9 16 14   CREATININE 0.85 0.52* 0.64 0.57* 0.72 0.63  CALCIUM 8.4* 8.5* 8.3* 8.6* 8.5* 8.6*  MG 1.9  --   --   --   --   --   PHOS 3.4  --   --   --   --   --    GFR: Estimated Creatinine Clearance: 66.1 mL/min (by C-G formula based on SCr of 0.63 mg/dL). Liver Function Tests: Recent Labs  Lab 02/06/23 0510  AST 80*  ALT 23  ALKPHOS 95  BILITOT 0.6  PROT 4.9*  ALBUMIN 2.7*    Recent Results (from the past 240 hour(s))  Surgical pcr screen     Status: None   Collection Time: 02/05/23 10:09 AM   Specimen: Nasal Mucosa; Nasal Swab  Result Value Ref Range Status   MRSA, PCR NEGATIVE NEGATIVE Final   Staphylococcus aureus NEGATIVE NEGATIVE Final    Comment: (NOTE) The Xpert SA Assay (FDA approved for NASAL specimens in patients 54 years of age and older), is one component of a comprehensive surveillance program. It is not intended to diagnose  infection nor to guide or monitor treatment. Performed at [facility], [address] [place], [place] [address]      Radiology Studies: No results found.  Scheduled Meds:  amLODipine  5 mg Oral Daily   docusate sodium  100 mg Oral BID   enoxaparin (LOVENOX) injection  40 mg Subcutaneous Q24H   folic acid  1 mg Oral Daily   multivitamin with minerals  1 tablet Oral Daily   nicotine  7 mg Transdermal Daily   polyethylene glycol  17 g Oral BID   thiamine  100 mg Oral Daily   Or   thiamine  100 mg Intravenous Daily   Continuous Infusions:   LOS: 8 days   Time spent: 35 minutes  Carollee Herter, DO  [facility]  02/12/2023, 12:31 PM

## 2023-02-13 ENCOUNTER — Encounter (HOSPITAL_COMMUNITY): Payer: Self-pay | Admitting: Internal Medicine

## 2023-02-13 DIAGNOSIS — D62 Acute posthemorrhagic anemia: Secondary | ICD-10-CM | POA: Diagnosis not present

## 2023-02-13 DIAGNOSIS — S72001A Fracture of unspecified part of neck of right femur, initial encounter for closed fracture: Secondary | ICD-10-CM | POA: Diagnosis not present

## 2023-02-13 DIAGNOSIS — E871 Hypo-osmolality and hyponatremia: Secondary | ICD-10-CM

## 2023-02-13 DIAGNOSIS — W19XXXA Unspecified fall, initial encounter: Secondary | ICD-10-CM | POA: Diagnosis not present

## 2023-02-13 DIAGNOSIS — F1010 Alcohol abuse, uncomplicated: Secondary | ICD-10-CM | POA: Diagnosis not present

## 2023-02-13 MED ORDER — VITAMIN B-1 100 MG PO TABS: 100.0000 mg | ORAL_TABLET | Freq: Every day | ORAL | Status: AC

## 2023-02-13 MED ORDER — HYDROCODONE-ACETAMINOPHEN 5-325 MG PO TABS: 1.0000 | ORAL_TABLET | Freq: Four times a day (QID) | ORAL | 0 refills | Status: AC | PRN

## 2023-02-13 MED ORDER — FAMOTIDINE 20 MG PO TABS: 20.0000 mg | ORAL_TABLET | Freq: Two times a day (BID) | ORAL | Status: AC

## 2023-02-13 MED ORDER — ASPIRIN 81 MG PO TBEC: 81.0000 mg | DELAYED_RELEASE_TABLET | Freq: Two times a day (BID) | ORAL | Status: AC

## 2023-02-13 MED ORDER — ASPIRIN 81 MG PO TBEC: 81.0000 mg | DELAYED_RELEASE_TABLET | Freq: Two times a day (BID) | ORAL | Status: DC

## 2023-02-13 MED ORDER — FOLIC ACID 1 MG PO TABS: 1.0000 mg | ORAL_TABLET | Freq: Every day | ORAL | Status: AC

## 2023-02-13 NOTE — Care Management Important Message (Signed)
Important Message  Patient Details  Name: Larry [name] MRN: [id number] Date of Birth: 11-27-1950   Important Message Given:  Yes - Medicare IM     Dorena Bodo 02/13/2023, 3:04 PM

## 2023-02-13 NOTE — Plan of Care (Signed)
  Problem: Pain Managment: Goal: General experience of comfort will improve Outcome: Progressing   Problem: Safety: Goal: Ability to remain free from injury will improve Outcome: Progressing   Problem: Skin Integrity: Goal: Risk for impaired skin integrity will decrease Outcome: Progressing   

## 2023-02-13 NOTE — Progress Notes (Signed)
PROGRESS NOTE    Larry [name]  IRJ:[id number] DOB: Sep 23, 1950 DOA: 02/04/2023 PCP: Benita Stabile, MD  Subjective: Pt seen and examined. Stable overnight. Awaiting insurance authorization for [facility] in [name], [place].   Hospital Course: HPI: Larry [name] is a 72 y.o. male with medical history significant for alcohol abuse, COPD, hypertension, BPH, pneumoconiosis,  tobacco abuse Patient presented to the ED reports of a fall.   Patient was trying to use the ATM  the bank across the street, he got out of his truck, and fell on the street.  He reports dizziness over the past several days present when standing, he reports feeling unwell, with poor oral intake over the past 2 to 3 weeks.  No vomiting.  Reports occasional diarrhea.  Reports chronic difficulty voiding.  No chest pain.  Chronic and unchanged difficulty breathing. Drinks alcohol daily, he drinks about half of 1/5 of vodka.  He reports his last drink was today at about 10 AM.  He reports he drank a small amount, compared to what he normally drinks.  Reports a history of DTs requiring hospitalization.  Significant Events: Admitted 02/04/2023 for right intertrochanteric fracture 02-05-2023 IM nailing right intertrochanteric fracture  Significant Labs: Admitting HgB 11.8 02-06-2023 HgB 7.6  Significant Imaging Studies: Admission right hip XR shows Comminuted, displaced right femoral intratrochanteric fracture   Antibiotic Therapy: Anti-infectives (From admission, onward)    Start     Dose/Rate Route Frequency Ordered Stop   02/06/23 0600  ceFAZolin (ANCEF) IVPB 2g/100 mL premix        2 g 200 mL/hr over 30 Minutes Intravenous On call to O.R. 02/05/23 1243 02/05/23 1510   02/05/23 2200  ceFAZolin (ANCEF) IVPB 2g/100 mL premix        2 g 200 mL/hr over 30 Minutes Intravenous Every 8 hours 02/05/23 1920 02/06/23 2159       Procedures: 02-05-2023 IM nail right intertroch  fracture  Consultants: Orthopedic(haddix)    Assessment and Plan: * Closed right hip fracture (HCC) 02-04-2023 S/p Fall.  Pelvic x-ray - comminuted, displaced right femoral intratrochanteric fracture.  EDP talked to Dr. Charlann Boxer, will see in consult, admit to [facility], possible surgery 10/1.  N.P.O. midnight -IV Dilaudid 0.5 mg every 4 hourly prn -Chest X-ray clear, obtain Pre-op EKG  02-05-2023 had IM nailing of right femur  02-06-2023 will need SNF placement. 02-13-2023 awaiting SNF insurance authorization.  Acute postoperative anemia due to expected blood loss 02-06-2023 drop in HgB from 11.8(admission) to 7.6(today). likely a combo bleeding from initial fracture and recent surgery. No need for transfusion at this point. 02-07-2023 required 1 unit PRBC transfusion  02-13-2023 HgB stable at 8.8 g/dl  Fall at home, initial encounter 02-04-2023 Reports orthostatic dizziness with resultant fall and subsequent hip fracture.  Head CT-soft tissue swelling left frontal scalp. Fall in the setting of chronic alcohol abuse.  patient reports poor oral intake over the past 2 to 3 weeks.  Tachycardic heart rate 94-104 dizziness is not new. - N/s + 20kcl 75cc/hr x 12hrs  02-06-2023. Pt now is s/p IM nail to right femur for intertrochanteric fracture. Will need PT and CM for SNF.   Alcohol abuse Drinks half of 1/5 of vodka daily.  Last alcoholic beverage was this morning at about 10 AM.  Blood alcohol level unremarkable.  Prior hospitalization for alcohol withdrawal,- 11/2021 patient did well with CIWA protocol. -CIWA as needed -Thiamine, folate, multivitamins -Check mag, Phos  02-06-2023 pt received 4 mg IV  push ativan. Pt is now somnolent and requiring supplemental O2. 02-12-2023 has been without alcohol for several days now. Last alcoholic beverage was on 02-09-2023 around 11 AM. 02-13-2023 stable.   Hypokalemia 02-04-2023 Mild hypokalemia. Potassium 3.3. 02-06-2023 resolved after  replacement  Protein-calorie malnutrition, severe (HCC) See RD note 02-05-2023 Severe Malnutrition related to social / environmental circumstances as evidenced by meal completion < 25%, severe muscle depletion, severe fat depletion.    COPD (chronic obstructive pulmonary disease) (HCC) History of COPD and pneumoconiosis.  Chest x-ray clear. Not On home O2.  BPH (benign prostatic hyperplasia) Stable.  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 BMI 16. -Nuttrition consult says he has severe protein calorie malnutrition.  Hypertension Stable. -Resume Norvasc 02-06-2023 BP stable 02-13-2023 stable HTN on norvasc.   DVT prophylaxis: enoxaparin (LOVENOX) injection 40 mg Start: 02/06/23 0900 SCDs Start: 02/04/23 2154    Code Status: Full Code Family Communication: no family at bedside. Pt is decisional Disposition Plan: SNF Reason for continuing need for hospitalization: awaiting insurance authorization for SNF. Medically stable for discharge  Objective: Vitals:   02/12/23 1532 02/12/23 1955 02/13/23 0342 02/13/23 0759  BP: 129/69 (!) 146/86 134/73 138/67  Pulse: 95 (!) 102 95 (!) 103  Resp: 17 18 18 18   Temp: 98.9 F (37.2 C) 99.1 F (37.3 C) 98.4 F (36.9 C) 98 F (36.7 C)  TempSrc: Oral Oral    SpO2: 96% 96% 96% 96%  Weight:      Height:        Intake/Output Summary (Last 24 hours) at 02/13/2023 0844 Last data filed at 02/13/2023 0542 Gross per 24 hour  Intake 118 ml  Output 1550 ml  Net -1432 ml   Filed Weights   02/04/23 1409 02/05/23 1307  Weight: 56.7 kg 56 kg    Examination:  Physical Exam Vitals and nursing note reviewed.  Constitutional:      General: He is not in acute distress.    Appearance: He is not toxic-appearing or diaphoretic.     Comments: Appears chronically ill  HENT:     Head: Normocephalic and atraumatic.  Eyes:     General: No scleral icterus. Cardiovascular:     Rate and Rhythm: Normal rate and regular rhythm.  Pulmonary:      Effort: Pulmonary effort is normal. No respiratory distress.  Abdominal:     General: Abdomen is flat. Bowel sounds are normal.     Palpations: Abdomen is soft.  Musculoskeletal:     Right lower leg: No edema.     Left lower leg: No edema.  Skin:    General: Skin is warm and dry.     Capillary Refill: Capillary refill takes less than 2 seconds.  Neurological:     General: No focal deficit present.     Mental Status: He is alert and oriented to person, place, and time.     Data Reviewed: I have personally reviewed following labs and imaging studies  CBC: Recent Labs  Lab 02/08/23 0554 02/08/23 1130 02/08/23 1958 02/09/23 1107 02/10/23 0709 02/11/23 1343 02/12/23 0706  WBC 6.1  --   --  5.0 5.3 7.0 6.5  HGB 8.0*   < > 8.0* 7.9* 9.1* 8.7* 8.8*  HCT 23.1*   < > 23.3* 22.5* 26.9* 26.0* 26.6*  MCV 97.5  --   --  97.8 99.3 102.0* 101.9*  PLT 171  --   --  220 269 314 291   < > = values in this interval not  displayed.   Basic Metabolic Panel: Recent Labs  Lab 02/08/23 0554 02/09/23 1107 02/10/23 0709 02/11/23 1343 02/12/23 0706  NA 134* 136 134* 133* 132*  K 3.8 3.8 4.2 4.3 4.6  CL 98 102 105 100 100  CO2 27 25 22 25 24   GLUCOSE 110* 112* 98 99 98  BUN 8 9 9 16 14   CREATININE 0.52* 0.64 0.57* 0.72 0.63  CALCIUM 8.5* 8.3* 8.6* 8.5* 8.6*   GFR: Estimated Creatinine Clearance: 66.1 mL/min (by C-G formula based on SCr of 0.63 mg/dL).  Recent Results (from the past 240 hour(s))  Surgical pcr screen     Status: None   Collection Time: 02/05/23 10:09 AM   Specimen: Nasal Mucosa; Nasal Swab  Result Value Ref Range Status   MRSA, PCR NEGATIVE NEGATIVE Final   Staphylococcus aureus NEGATIVE NEGATIVE Final    Comment: (NOTE) The Xpert SA Assay (FDA approved for NASAL specimens in patients 62 years of age and older), is one component of a comprehensive surveillance program. It is not intended to diagnose infection nor to guide or monitor treatment. Performed at [facility], [address] [place], [place] [address]      Radiology Studies: No results found.  Scheduled Meds:  amLODipine  5 mg Oral Daily   docusate sodium  100 mg Oral BID   enoxaparin (LOVENOX) injection  40 mg Subcutaneous Q24H   folic acid  1 mg Oral Daily   multivitamin with minerals  1 tablet Oral Daily   nicotine  7 mg Transdermal Daily   polyethylene glycol  17 g Oral BID   thiamine  100 mg Oral Daily   Continuous Infusions:   LOS: 9 days   Time spent: 35 minutes  Carollee Herter, DO  [facility]  02/13/2023, 8:44 AM

## 2023-02-13 NOTE — TOC Progression Note (Addendum)
Transition of Care [facility]) - Progression Note    Patient Details  Name: THAER GONGWER MRN: [id number] Date of Birth: 29-Nov-1950  Transition of Care [facility]) CM/SW Contact  Marliss Coots, LCSW Phone Number: 02/13/2023, 1:24 PM  Clinical Narrative:     This CSW introduced themself and their role to patient at beside. Patient denied Substance Use Education/Counseling offered by this CSW. Patient uses tobacco and no other substance. Patient requested clean clothes prior to discharge.   Expected Discharge Plan: Skilled Nursing Facility Barriers to Discharge: Barriers Resolved  Expected Discharge Plan and Services       Living arrangements for the past 2 months: Mobile Home Expected Discharge Date: 02/13/23                                     Social Determinants of Health (SDOH) Interventions SDOH Screenings   Food Insecurity: Food Insecurity Present (02/04/2023)  Housing: Medium Risk (02/04/2023)  Transportation Needs: Unmet Transportation Needs (02/04/2023)  Utilities: At Risk (02/04/2023)  Tobacco Use: High Risk (02/05/2023)    Readmission Risk Interventions    12/12/2021   11:03 AM 03/10/2021    2:14 PM 02/11/2021    1:11 PM  Readmission Risk Prevention Plan  Medication Screening  Complete   Transportation Screening Complete Complete Complete  PCP or Specialist Appt within 3-5 Days Not Complete    Home Care Screening   Complete  Medication Review (RN CM)   Complete  HRI or Home Care Consult Complete    Social Work Consult for Recovery Care Planning/Counseling Complete    Palliative Care Screening Not Applicable    Medication Review Oceanographer) Complete

## 2023-02-13 NOTE — Assessment & Plan Note (Signed)
Clinically insignificant ?

## 2023-02-13 NOTE — TOC Transition Note (Signed)
Transition of Care [facility]) - CM/SW Discharge Note   Patient Details  Name: Larry [name] MRN: [id number] Date of Birth: 1950/06/14  Transition of Care [facility]) CM/SW Contact:  Dora Simeone A [place], LCSWA Phone Number: 02/13/2023, 11:17 AM   Clinical Narrative:     Patient will DC to: [facility]   Anticipated DC date: 02/13/23  Family notified: Pt declined  Transport by: Theodoro Grist ID: [id number]   Approval Dates: 02/13/2023-02/15/2023     Per MD patient ready for DC to [facility]. RN, patient, patient's family, and facility notified of DC. Discharge Summary and FL2 sent to facility. RN to call report prior to discharge [phone]). DC packet on chart. Ambulance transport requested for patient.     CSW will sign off for now as social work intervention is no longer needed. Please consult [place] again if new needs arise.   Final next level of care: Skilled Nursing Facility Barriers to Discharge: Barriers Resolved   Patient Goals and CMS Choice      Discharge Placement                Patient chooses bed at: [facility] Patient to be transferred to facility by: [facility] Name of family member notified: Pt declined Patient and family notified of of transfer: 02/13/23  Discharge Plan and Services Additional resources added to the After Visit Summary for                                       Social Determinants of Health (SDOH) Interventions SDOH Screenings   Food Insecurity: Food Insecurity Present (02/04/2023)  Housing: Medium Risk (02/04/2023)  Transportation Needs: Unmet Transportation Needs (02/04/2023)  Utilities: At Risk (02/04/2023)  Tobacco Use: High Risk (02/05/2023)     Readmission Risk Interventions    12/12/2021   11:03 AM 03/10/2021    2:14 PM 02/11/2021    1:11 PM  Readmission Risk Prevention Plan  Medication Screening  Complete   Transportation Screening Complete Complete Complete  PCP or Specialist  Appt within 3-5 Days Not Complete    Home Care Screening   Complete  Medication Review (RN CM)   Complete  HRI or Home Care Consult Complete    Social Work Consult for Recovery Care Planning/Counseling Complete    Palliative Care Screening Not Applicable    Medication Review Oceanographer) Complete

## 2023-02-13 NOTE — Discharge Summary (Addendum)
[facility] Phys[facility] Discharge Summary   Patient name: Larry [name]  Admit date:     02/04/2023  Discharge date: 02/13/2023  Attending Physician: Onnie Boer [[id number]]  Discharge Physician: Carollee Herter   PCP: Benita Stabile, MD  Admitted From: Home Disposition:   [facility] in [place], [place]  Recommendations for Outpatient Follow-up:  Follow up with PCP in 1-2 weeks Follow up with Dr. Jena Gauss (orthopedics) in 3 weeks following hospital discharge   Home Health:No Equipment/Devices: None  Discharge Condition:Stable CODE STATUS:FULL Diet recommendation: Heart Healthy Fluid Restriction: None  Hospital Summary: HPI: [name] is a 72 y.o. male with medical history significant for alcohol abuse, COPD, hypertension, BPH, pneumoconiosis,  tobacco abuse Patient presented to the ED reports of a fall.   Patient was trying to use the ATM  the bank across the street, he got out of his truck, and fell on the street.  He reports dizziness over the past several days present when standing, he reports feeling unwell, with poor oral intake over the past 2 to 3 weeks.  No vomiting.  Reports occasional diarrhea.  Reports chronic difficulty voiding.  No chest pain.  Chronic and unchanged difficulty breathing. Drinks alcohol daily, he drinks about half of 1/5 of vodka.  He reports his last drink was today at about 10 AM.  He reports he drank a small amount, compared to what he normally drinks.  Reports a history of DTs requiring hospitalization.  Significant Events: Admitted 02/04/2023 for right intertrochanteric fracture 02-05-2023 IM nailing right intertrochanteric fracture  Significant Labs: Admitting HgB 11.8 02-06-2023 HgB 7.6 Discharge HgB 8.8  Significant Imaging Studies: Admission right hip XR shows Comminuted, displaced right femoral intratrochanteric fracture   Antibiotic Therapy: Anti-infectives (From admission, onward)    Start     Dose/Rate Route Frequency  Ordered Stop   02/06/23 0600  ceFAZolin (ANCEF) IVPB 2g/100 mL premix        2 g 200 mL/hr over 30 Minutes Intravenous On call to O.R. 02/05/23 1243 02/05/23 1510   02/05/23 2200  ceFAZolin (ANCEF) IVPB 2g/100 mL premix        2 g 200 mL/hr over 30 Minutes Intravenous Every 8 hours 02/05/23 1920 02/06/23 2159       Procedures: 02-05-2023 IM nail right intertroch fracture  Consultants: Orthopedic(haddix)   Hospital Course by Problem: * Closed right hip fracture (HCC) 02-04-2023 S/p Fall.  Pelvic x-ray - comminuted, displaced right femoral intratrochanteric fracture.  EDP talked to Dr. Charlann Boxer, will see in consult, admit to [facility], possible surgery 10/1.  N.P.O. midnight -IV Dilaudid 0.5 mg every 4 hourly prn -Chest X-ray clear, obtain Pre-op EKG  02-05-2023 had IM nailing of right femur  02-06-2023 will need SNF placement. 02-13-2023 awaiting SNF insurance authorization.  02-13-2023 received information from CM that insurance authorization has been approved. ASA 81 mg bid for 6 weeks for DVT prophylaxis  Acute postoperative anemia due to expected blood loss 02-06-2023 drop in HgB from 11.8(admission) to 7.6(today). likely a combo bleeding from initial fracture and recent surgery. No need for transfusion at this point. 02-07-2023 required 1 unit PRBC transfusion  02-13-2023 HgB stable at 8.8 g/dl  Fall at home, initial encounter 02-04-2023 Reports orthostatic dizziness with resultant fall and subsequent hip fracture.  Head CT-soft tissue swelling left frontal scalp. Fall in the setting of chronic alcohol abuse.  patient reports poor oral intake over the past 2 to 3 weeks.  Tachycardic heart rate 94-104 dizziness is  not new. - N/s + 20kcl 75cc/hr x 12hrs  02-06-2023. Pt now is s/p IM nail to right femur for intertrochanteric fracture. Will need PT and CM for SNF.   Alcohol abuse Drinks half of 1/5 of vodka daily.  Last alcoholic beverage was this morning at about 10 AM.  Blood  alcohol level unremarkable.  Prior hospitalization for alcohol withdrawal,- 11/2021 patient did well with CIWA protocol. -CIWA as needed -Thiamine, folate, multivitamins -Check mag, Phos  02-06-2023 pt received 4 mg IV push ativan. Pt is now somnolent and requiring supplemental O2. 02-12-2023 has been without alcohol for several days now. Last alcoholic beverage was on 02-09-2023 around 11 AM. 02-13-2023 stable.   Hypokalemia 02-04-2023 Mild hypokalemia. Potassium 3.3. 02-06-2023 resolved after replacement  Protein-calorie malnutrition, severe (HCC) See RD note 02-05-2023 Severe Malnutrition related to social / environmental circumstances as evidenced by meal completion < 25%, severe muscle depletion, severe fat depletion.    COPD (chronic obstructive pulmonary disease) (HCC) History of COPD and pneumoconiosis.  Chest x-ray clear. Not On home O2.  BPH (benign prostatic hyperplasia) Stable.  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 BMI 16. -Nuttrition consult says he has severe protein calorie malnutrition.  Hypertension Stable. -Resume Norvasc 02-06-2023 BP stable 02-13-2023 stable HTN on norvasc.  Hyponatremia Clinically insignificant.    Discharge Diagnoses:  Principal Problem:   Closed right hip fracture (HCC) Active Problems:   Alcohol abuse   Fall at home, initial encounter   Acute postoperative anemia due to expected blood loss   Hypertension   Failure to thrive in adult   BPH (benign prostatic hyperplasia)   COPD (chronic obstructive pulmonary disease) (HCC)   Protein-calorie malnutrition, severe (HCC)   Hypokalemia   Hyponatremia   Discharge Instructions  Discharge Instructions     Call MD for:  difficulty breathing, headache or visual disturbances   Complete by: As directed    Call MD for:  extreme fatigue   Complete by: As directed    Call MD for:  hives   Complete by: As directed    Call MD for:  persistant dizziness or light-headedness    Complete by: As directed    Call MD for:  persistant nausea and vomiting   Complete by: As directed    Call MD for:  redness, tenderness, or signs of infection (pain, swelling, redness, odor or green/yellow discharge around incision site)   Complete by: As directed    Call MD for:  severe uncontrolled pain   Complete by: As directed    Call MD for:  temperature >100.4   Complete by: As directed    Diet - low sodium heart healthy   Complete by: As directed    Discharge instructions   Complete by: As directed    1. Follow up with your primary care physician in 1-2 weeks following discharge from Skilled Nursing Facility 2. Follow up with orthopedics(Dr. Haddix) in 3 weeks following discharge from hospital. Call office [phone] for appointment.   Discharge wound care:   Complete by: As directed    1. Gentle wash area with gloved hane with chlorhexidine or betadine soap with warm water once a daily. Do not scrub with wash cloth.  Tap area dry. Do Not rub dry.  Cover with dry bandage.   Increase activity slowly   Complete by: As directed    Weight bearing as tolerated   Complete by: As directed    Laterality: right   Extremity: Lower  Allergies as of 02/13/2023   No Known Allergies      Medication List     STOP taking these medications    OVER THE COUNTER MEDICATION   TYLENOL PO       TAKE these medications    amLODipine 5 MG tablet Commonly known as: NORVASC Take 5 mg by mouth daily.   aspirin EC 81 MG tablet Take 1 tablet (81 mg total) by mouth 2 (two) times daily. Swallow whole.   famotidine 20 MG tablet Commonly known as: PEPCID Take 1 tablet (20 mg total) by mouth 2 (two) times daily.   folic acid 1 MG tablet Commonly known as: FOLVITE Take 1 tablet (1 mg total) by mouth daily. Start taking on: February 14, 2023   HYDROcodone-acetaminophen 5-325 MG tablet Commonly known as: NORCO/VICODIN Take 1 tablet by mouth every 6 (six) hours as needed for up  to 5 days for moderate pain (pain score 4-6).   thiamine 100 MG tablet Commonly known as: Vitamin B-1 Take 1 tablet (100 mg total) by mouth daily. Start taking on: February 14, 2023   VITAMIN C PO Take 1 tablet by mouth daily.   VITAMIN D-3 PO Take 1 capsule by mouth daily.               Discharge Care Instructions  (From admission, onward)           Start     Ordered   02/13/23 0000  Weight bearing as tolerated       Question Answer Comment  Laterality right   Extremity Lower      02/13/23 1029   02/13/23 0000  Discharge wound care:       Comments: 1. Gentle wash area with gloved hane with chlorhexidine or betadine soap with warm water once a daily. Do not scrub with wash cloth.  Tap area dry. Do Not rub dry.  Cover with dry bandage.   02/13/23 1029            Contact information for after-discharge care     Destination     [facility] .   Service: Skilled Nursing Contact information: [address] [place] [address] [phone]                    No Known Allergies  Discharge Exam: Vitals:   02/13/23 0342 02/13/23 0759  BP: 134/73 138/67  Pulse: 95 (!) 103  Resp: 18 18  Temp: 98.4 F (36.9 C) 98 F (36.7 C)  SpO2: 96% 96%    Physical Exam Vitals and nursing note reviewed.  Constitutional:      General: He is not in acute distress.    Appearance: He is not toxic-appearing or diaphoretic.     Comments: Appears chronically ill  HENT:     Head: Normocephalic and atraumatic.  Eyes:     General: No scleral icterus. Cardiovascular:     Rate and Rhythm: Normal rate and regular rhythm.  Pulmonary:     Effort: Pulmonary effort is normal. No respiratory distress.  Abdominal:     General: Abdomen is flat. Bowel sounds are normal.     Palpations: Abdomen is soft.  Musculoskeletal:     Right lower leg: No edema.     Left lower leg: No edema.  Skin:    General: Skin is warm and dry.      Capillary Refill: Capillary refill takes less than 2 seconds.  Neurological:  General: No focal deficit present.     Mental Status: He is alert and oriented to person, place, and time.     The results of significant diagnostics from this hospitalization (including imaging, microbiology, ancillary and laboratory) are listed below for reference.    Microbiology: Recent Results (from the past 240 hour(s))  Surgical pcr screen     Status: None   Collection Time: 02/05/23 10:09 AM   Specimen: Nasal Mucosa; Nasal Swab  Result Value Ref Range Status   MRSA, PCR NEGATIVE NEGATIVE Final   Staphylococcus aureus NEGATIVE NEGATIVE Final    Comment: (NOTE) The Xpert SA Assay (FDA approved for NASAL specimens in patients 4 years of age and older), is one component of a comprehensive surveillance program. It is not intended to diagnose infection nor to guide or monitor treatment. Performed at [facility], [address], [place], [place] [address]      Labs: Basic Metabolic Panel: Recent Labs  Lab 02/08/23 0554 02/09/23 1107 02/10/23 0709 02/11/23 1343 02/12/23 0706  NA 134* 136 134* 133* 132*  K 3.8 3.8 4.2 4.3 4.6  CL 98 102 105 100 100  CO2 27 25 22 25 24   GLUCOSE 110* 112* 98 99 98  BUN 8 9 9 16 14   CREATININE 0.52* 0.64 0.57* 0.72 0.63  CALCIUM 8.5* 8.3* 8.6* 8.5* 8.6*   CBC: Recent Labs  Lab 02/08/23 0554 02/08/23 1130 02/08/23 1958 02/09/23 1107 02/10/23 0709 02/11/23 1343 02/12/23 0706  WBC 6.1  --   --  5.0 5.3 7.0 6.5  HGB 8.0*   < > 8.0* 7.9* 9.1* 8.7* 8.8*  HCT 23.1*   < > 23.3* 22.5* 26.9* 26.0* 26.6*  MCV 97.5  --   --  97.8 99.3 102.0* 101.9*  PLT 171  --   --  220 269 314 291   < > = values in this interval not displayed.   Urinalysis    Component Value Date/Time   COLORURINE YELLOW 02/04/2023 1714   APPEARANCEUR CLOUDY (A) 02/04/2023 1714   LABSPEC 1.013 02/04/2023 1714   PHURINE 9.0 (H) 02/04/2023 1714   GLUCOSEU NEGATIVE 02/04/2023  1714   HGBUR NEGATIVE 02/04/2023 1714   BILIRUBINUR NEGATIVE 02/04/2023 1714   KETONESUR 20 (A) 02/04/2023 1714   PROTEINUR 30 (A) 02/04/2023 1714   NITRITE NEGATIVE 02/04/2023 1714   LEUKOCYTESUR TRACE (A) 02/04/2023 1714   Sepsis Labs Recent Labs  Lab 02/09/23 1107 02/10/23 0709 02/11/23 1343 02/12/23 0706  WBC 5.0 5.3 7.0 6.5   Microbiology Recent Results (from the past 240 hour(s))  Surgical pcr screen     Status: None   Collection Time: 02/05/23 10:09 AM   Specimen: Nasal Mucosa; Nasal Swab  Result Value Ref Range Status   MRSA, PCR NEGATIVE NEGATIVE Final   Staphylococcus aureus NEGATIVE NEGATIVE Final    Comment: (NOTE) The Xpert SA Assay (FDA approved for NASAL specimens in patients 67 years of age and older), is one component of a comprehensive surveillance program. It is not intended to diagnose infection nor to guide or monitor treatment. Performed at [facility], [address] [place], [place] [address]     Procedures/Studies: DG HIP UNILAT WITH PELVIS 1V RIGHT  Result Date: 02/05/2023 CLINICAL DATA:  Postop. EXAM: DG HIP (WITH OR WITHOUT PELVIS) 1V RIGHT COMPARISON:  Preoperative imaging. FINDINGS: Femoral intramedullary nail with trans trochanteric and distal locking screw fixation traverse intertrochanteric femur fracture. Improved fracture alignment from preoperative imaging. There is persistent  displacement of the lesser and greater trochanters. Recent postsurgical change includes air and edema in the soft tissues. IMPRESSION: Status post ORIF of intertrochanteric femur fracture. No immediate postoperative complication. Electronically Signed   By: Narda Rutherford M.D.   On: 02/05/2023 17:46   DG FEMUR, MIN 2 VIEWS RIGHT  Result Date: 02/05/2023 CLINICAL DATA:  Elective surgery. EXAM: RIGHT FEMUR 2 VIEWS COMPARISON:  Preoperative imaging. FINDINGS: Five fluoroscopic spot views of the right proximal femur obtained in the operating room. Femoral  intramedullary nail with trans trochanteric and distal locking screw fixation traverse intertrochanteric femur fracture. Fluoroscopy time 1 minutes 2 seconds. Dose 6.08 mGy. IMPRESSION: Intraoperative fluoroscopy during ORIF of intertrochanteric femur fracture. Electronically Signed   By: Narda Rutherford M.D.   On: 02/05/2023 17:46   CT Head Wo Contrast  Addendum Date: 02/04/2023   ADDENDUM REPORT: 02/04/2023 18:09 ADDENDUM: This addendum addresses the findings section of the report. The brain subheading should state "No hemorrhage" Electronically Signed   By: Lorenza Cambridge M.D.   On: 02/04/2023 18:09   Result Date: 02/04/2023 CLINICAL DATA:  Head trauma, minor (Age >= 65y) EXAM: CT HEAD WITHOUT CONTRAST TECHNIQUE: Contiguous axial images were obtained from the base of the skull through the vertex without intravenous contrast. RADIATION DOSE REDUCTION: This exam was performed according to the departmental dose-optimization program which includes automated exposure control, adjustment of the mA and/or kV according to patient size and/or use of iterative reconstruction technique. COMPARISON:  CT head 12/10/21 FINDINGS: Brain: Hemorrhage. No hydrocephalus. No extra-axial fluid collection. No CT evidence of an acute cortical infarct. No mass effect. No mass lesion. Generalized volume loss without lobar predominance. There is sequela of mild chronic microvascular ischemic change. Vascular: No hyperdense vessel or unexpected calcification. Skull: Soft tissue swelling over the left frontal scalp. No evidence of underlying calvarial fracture. Sinuses/Orbits: No middle ear or mastoid effusion. Paranasal sinuses are clear. Bilateral lens replacement. Orbits are otherwise unremarkable. Other: None. IMPRESSION: Soft tissue swelling over the left frontal scalp. No evidence of underlying calvarial fracture or acute intracranial abnormality. Electronically Signed: By: Lorenza Cambridge M.D. On: 02/04/2023 16:50   DG Elbow  Complete Right  Result Date: 02/04/2023 CLINICAL DATA:  Status post fall. EXAM: RIGHT ELBOW - COMPLETE 3+ VIEW COMPARISON:  None Available. FINDINGS: Diminished exam detail due to suboptimal lateral projection radiograph. Within this limitation, there is no sign of acute fracture or dislocation. Lack of true lateral radiograph limits assessment for joint effusion which may suggest an occult radial head fracture. Soft tissues appear normal. IMPRESSION: 1. No acute findings. 2. Diminished exam detail due to suboptimal lateral projection radiograph. If there is a high clinical concern for elbow fracture then repeat imaging with true lateral projection radiograph is recommended. Electronically Signed   By: Signa Kell M.D.   On: 02/04/2023 17:33   DG Chest Portable 1 View  Result Date: 02/04/2023 CLINICAL DATA:  Fall.  Right hip fracture. EXAM: PORTABLE CHEST 1 VIEW COMPARISON:  Chest x-ray dated March 20, 2022. FINDINGS: The heart size and mediastinal contours are within normal limits. Normal pulmonary vascularity. Lungs remain hyperinflated with emphysematous changes. Scattered sequelae of prior granulomatous disease in the mediastinum, hila, and both lungs, unchanged. No focal consolidation, pleural effusion, or pneumothorax. No acute osseous abnormality. Chronically dislocated right shoulder. Old bilateral rib and left distal clavicle fractures. Multiple chronic thoracic compression deformities. IMPRESSION: 1. No active disease. 2. COPD.  Old granulomatous disease. Electronically Signed   By: Vickki Hearing.D.  On: 02/04/2023 17:32   DG Shoulder Right  Result Date: 02/04/2023 CLINICAL DATA:  Fall EXAM: RIGHT SHOULDER - 2+ VIEW COMPARISON:  Chest x-ray 03/20/2022 FINDINGS: There is inferior dislocation of the humeral head. This appears similar to chest x-ray 03/20/2022. There is no acute fracture. There is proximal humeral sideplate and screws. Exostosis along the medial aspect of the humeral  head appears unchanged from the prior examination. The bones are diffusely osteopenic. Granulomatous disease noted in the lungs, unchanged. IMPRESSION: 1. Inferior dislocation of the right humeral head, possibly chronic. No acute fractures identified. 2. Osteopenia. Electronically Signed   By: Darliss Cheney M.D.   On: 02/04/2023 17:30   DG Hip Unilat W or Wo Pelvis 2-3 Views Right  Result Date: 02/04/2023 CLINICAL DATA:  Fall. EXAM: DG HIP (WITH OR WITHOUT PELVIS) 2-3V RIGHT COMPARISON:  None Available. FINDINGS: Right femoral intratrochanteric fracture is present, comminuted. There is superolateral displacement of the distal fracture fragment. There is no evidence for dislocation. The bones are osteopenic. Artifact overlies pelvis. IMPRESSION: Comminuted, displaced right femoral intratrochanteric fracture. Electronically Signed   By: Darliss Cheney M.D.   On: 02/04/2023 17:28    Time coordinating discharge: 45 mins  SIGNED:  Carollee Herter, DO [facility] 02/13/23, 12:04 PM

## 2023-02-14 DIAGNOSIS — S72141D Displaced intertrochanteric fracture of right femur, subsequent encounter for closed fracture with routine healing: Secondary | ICD-10-CM | POA: Diagnosis not present

## 2023-02-14 DIAGNOSIS — Y92410 Unspecified street and highway as the place of occurrence of the external cause: Secondary | ICD-10-CM | POA: Diagnosis not present

## 2023-02-14 DIAGNOSIS — N400 Enlarged prostate without lower urinary tract symptoms: Secondary | ICD-10-CM | POA: Diagnosis not present

## 2023-02-14 DIAGNOSIS — E43 Unspecified severe protein-calorie malnutrition: Secondary | ICD-10-CM | POA: Diagnosis not present

## 2023-02-14 DIAGNOSIS — Z741 Need for assistance with personal care: Secondary | ICD-10-CM | POA: Diagnosis not present

## 2023-02-14 DIAGNOSIS — Z23 Encounter for immunization: Secondary | ICD-10-CM | POA: Diagnosis not present

## 2023-02-14 DIAGNOSIS — R2689 Other abnormalities of gait and mobility: Secondary | ICD-10-CM | POA: Diagnosis not present

## 2023-02-14 DIAGNOSIS — W010XXA Fall on same level from slipping, tripping and stumbling without subsequent striking against object, initial encounter: Secondary | ICD-10-CM | POA: Diagnosis not present

## 2023-02-14 DIAGNOSIS — M6281 Muscle weakness (generalized): Secondary | ICD-10-CM | POA: Diagnosis not present

## 2023-02-14 DIAGNOSIS — F1010 Alcohol abuse, uncomplicated: Secondary | ICD-10-CM | POA: Diagnosis not present

## 2023-02-14 DIAGNOSIS — M549 Dorsalgia, unspecified: Secondary | ICD-10-CM | POA: Diagnosis not present

## 2023-02-14 DIAGNOSIS — J449 Chronic obstructive pulmonary disease, unspecified: Secondary | ICD-10-CM | POA: Diagnosis not present

## 2023-02-14 DIAGNOSIS — I10 Essential (primary) hypertension: Secondary | ICD-10-CM | POA: Diagnosis not present

## 2023-02-14 DIAGNOSIS — J64 Unspecified pneumoconiosis: Secondary | ICD-10-CM | POA: Diagnosis not present

## 2023-02-16 DIAGNOSIS — J449 Chronic obstructive pulmonary disease, unspecified: Secondary | ICD-10-CM | POA: Diagnosis not present

## 2023-02-16 DIAGNOSIS — E43 Unspecified severe protein-calorie malnutrition: Secondary | ICD-10-CM | POA: Diagnosis not present

## 2023-02-16 DIAGNOSIS — S72141D Displaced intertrochanteric fracture of right femur, subsequent encounter for closed fracture with routine healing: Secondary | ICD-10-CM | POA: Diagnosis not present

## 2023-02-16 DIAGNOSIS — F1010 Alcohol abuse, uncomplicated: Secondary | ICD-10-CM | POA: Diagnosis not present

## 2023-02-21 DIAGNOSIS — I10 Essential (primary) hypertension: Secondary | ICD-10-CM | POA: Diagnosis not present

## 2023-02-21 DIAGNOSIS — N400 Enlarged prostate without lower urinary tract symptoms: Secondary | ICD-10-CM | POA: Diagnosis not present

## 2023-02-21 DIAGNOSIS — F1010 Alcohol abuse, uncomplicated: Secondary | ICD-10-CM | POA: Diagnosis not present

## 2023-02-21 DIAGNOSIS — J449 Chronic obstructive pulmonary disease, unspecified: Secondary | ICD-10-CM | POA: Diagnosis not present

## 2023-03-20 ENCOUNTER — Encounter (HOSPITAL_COMMUNITY): Payer: Self-pay | Admitting: Family Medicine

## 2023-03-20 DIAGNOSIS — S72001D Fracture of unspecified part of neck of right femur, subsequent encounter for closed fracture with routine healing: Secondary | ICD-10-CM | POA: Diagnosis not present

## 2023-03-20 DIAGNOSIS — I10 Essential (primary) hypertension: Secondary | ICD-10-CM | POA: Diagnosis not present

## 2023-03-20 DIAGNOSIS — S72141A Displaced intertrochanteric fracture of right femur, initial encounter for closed fracture: Secondary | ICD-10-CM | POA: Diagnosis not present

## 2023-03-20 DIAGNOSIS — J449 Chronic obstructive pulmonary disease, unspecified: Secondary | ICD-10-CM | POA: Diagnosis not present

## 2023-03-20 DIAGNOSIS — F1010 Alcohol abuse, uncomplicated: Secondary | ICD-10-CM | POA: Diagnosis not present

## 2023-04-06 ENCOUNTER — Ambulatory Visit: Payer: Medicare HMO | Admitting: Orthopedic Surgery

## 2023-08-23 DEATH — deceased
